# Patient Record
Sex: Female | Born: 1940 | Race: White | Hispanic: No | Smoking: Former smoker
Health system: Southern US, Community
[De-identification: ages and names within clinical notes are randomized; demographics above are authoritative.]

## PROBLEM LIST (undated history)

## (undated) DIAGNOSIS — K562 Volvulus: Secondary | ICD-10-CM

## (undated) DIAGNOSIS — M1990 Unspecified osteoarthritis, unspecified site: Secondary | ICD-10-CM

## (undated) DIAGNOSIS — E039 Hypothyroidism, unspecified: Secondary | ICD-10-CM

## (undated) DIAGNOSIS — J449 Chronic obstructive pulmonary disease, unspecified: Secondary | ICD-10-CM

## (undated) DIAGNOSIS — F32A Depression, unspecified: Secondary | ICD-10-CM

## (undated) DIAGNOSIS — K222 Esophageal obstruction: Secondary | ICD-10-CM

## (undated) DIAGNOSIS — Z860101 Personal history of adenomatous and serrated colon polyps: Secondary | ICD-10-CM

## (undated) DIAGNOSIS — R911 Solitary pulmonary nodule: Secondary | ICD-10-CM

## (undated) DIAGNOSIS — F329 Major depressive disorder, single episode, unspecified: Secondary | ICD-10-CM

## (undated) DIAGNOSIS — K5790 Diverticulosis of intestine, part unspecified, without perforation or abscess without bleeding: Secondary | ICD-10-CM

## (undated) DIAGNOSIS — R0602 Shortness of breath: Secondary | ICD-10-CM

## (undated) DIAGNOSIS — F411 Generalized anxiety disorder: Secondary | ICD-10-CM

## (undated) DIAGNOSIS — I679 Cerebrovascular disease, unspecified: Secondary | ICD-10-CM

## (undated) DIAGNOSIS — M810 Age-related osteoporosis without current pathological fracture: Secondary | ICD-10-CM

## (undated) DIAGNOSIS — Z8701 Personal history of pneumonia (recurrent): Secondary | ICD-10-CM

## (undated) DIAGNOSIS — Z8673 Personal history of transient ischemic attack (TIA), and cerebral infarction without residual deficits: Secondary | ICD-10-CM

## (undated) DIAGNOSIS — J189 Pneumonia, unspecified organism: Secondary | ICD-10-CM

## (undated) DIAGNOSIS — R768 Other specified abnormal immunological findings in serum: Secondary | ICD-10-CM

## (undated) DIAGNOSIS — E785 Hyperlipidemia, unspecified: Secondary | ICD-10-CM

## (undated) DIAGNOSIS — K648 Other hemorrhoids: Secondary | ICD-10-CM

## (undated) DIAGNOSIS — I5032 Chronic diastolic (congestive) heart failure: Secondary | ICD-10-CM

## (undated) DIAGNOSIS — K626 Ulcer of anus and rectum: Secondary | ICD-10-CM

## (undated) DIAGNOSIS — Z86010 Personal history of colonic polyps: Secondary | ICD-10-CM

## (undated) HISTORY — DX: Cerebrovascular disease, unspecified: I67.9

## (undated) HISTORY — DX: Personal history of colonic polyps: Z86.010

## (undated) HISTORY — PX: CATARACT EXTRACTION: SUR2

## (undated) HISTORY — DX: Depression, unspecified: F32.A

## (undated) HISTORY — DX: Esophageal obstruction: K22.2

## (undated) HISTORY — PX: HIATAL HERNIA REPAIR: SHX195

## (undated) HISTORY — DX: Personal history of adenomatous and serrated colon polyps: Z86.0101

## (undated) HISTORY — DX: Unspecified osteoarthritis, unspecified site: M19.90

## (undated) HISTORY — DX: Major depressive disorder, single episode, unspecified: F32.9

## (undated) HISTORY — DX: Age-related osteoporosis without current pathological fracture: M81.0

## (undated) HISTORY — DX: Volvulus: K56.2

## (undated) HISTORY — DX: Personal history of transient ischemic attack (TIA), and cerebral infarction without residual deficits: Z86.73

## (undated) HISTORY — DX: Hyperlipidemia, unspecified: E78.5

## (undated) HISTORY — PX: TONSILLECTOMY: SUR1361

## (undated) HISTORY — PX: BLADDER SURGERY: SHX569

## (undated) HISTORY — DX: Generalized anxiety disorder: F41.1

## (undated) HISTORY — DX: Solitary pulmonary nodule: R91.1

## (undated) HISTORY — DX: Other hemorrhoids: K64.8

## (undated) HISTORY — DX: Chronic obstructive pulmonary disease, unspecified: J44.9

## (undated) HISTORY — DX: Diverticulosis of intestine, part unspecified, without perforation or abscess without bleeding: K57.90

## (undated) HISTORY — DX: Other specified abnormal immunological findings in serum: R76.8

## (undated) HISTORY — DX: Chronic diastolic (congestive) heart failure: I50.32

## (undated) HISTORY — DX: Ulcer of anus and rectum: K62.6

## (undated) HISTORY — DX: Hypothyroidism, unspecified: E03.9

## (undated) HISTORY — DX: Personal history of pneumonia (recurrent): Z87.01

---

## 1996-07-17 HISTORY — PX: TOTAL ABDOMINAL HYSTERECTOMY W/ BILATERAL SALPINGOOPHORECTOMY: SHX83

## 1997-07-17 LAB — HM COLONOSCOPY

## 1997-10-15 ENCOUNTER — Encounter: Admission: RE | Admit: 1997-10-15 | Discharge: 1998-01-13 | Payer: Self-pay | Admitting: Internal Medicine

## 1999-09-25 ENCOUNTER — Encounter: Payer: Self-pay | Admitting: Emergency Medicine

## 1999-09-25 ENCOUNTER — Emergency Department (HOSPITAL_COMMUNITY): Admission: EM | Admit: 1999-09-25 | Discharge: 1999-09-25 | Payer: Self-pay | Admitting: Emergency Medicine

## 2000-11-03 ENCOUNTER — Encounter: Payer: Self-pay | Admitting: Emergency Medicine

## 2000-11-03 ENCOUNTER — Inpatient Hospital Stay (HOSPITAL_COMMUNITY): Admission: EM | Admit: 2000-11-03 | Discharge: 2000-11-08 | Payer: Self-pay | Admitting: Emergency Medicine

## 2000-11-04 ENCOUNTER — Encounter: Payer: Self-pay | Admitting: Internal Medicine

## 2000-11-07 ENCOUNTER — Encounter: Payer: Self-pay | Admitting: Internal Medicine

## 2004-08-01 ENCOUNTER — Ambulatory Visit: Payer: Self-pay | Admitting: Internal Medicine

## 2004-09-20 ENCOUNTER — Ambulatory Visit: Payer: Self-pay | Admitting: Internal Medicine

## 2004-10-03 ENCOUNTER — Ambulatory Visit: Payer: Self-pay | Admitting: Internal Medicine

## 2004-11-01 ENCOUNTER — Ambulatory Visit: Payer: Self-pay | Admitting: Internal Medicine

## 2004-11-03 ENCOUNTER — Ambulatory Visit: Payer: Self-pay | Admitting: Cardiology

## 2004-12-14 ENCOUNTER — Ambulatory Visit: Payer: Self-pay | Admitting: Internal Medicine

## 2005-02-23 ENCOUNTER — Ambulatory Visit: Payer: Self-pay | Admitting: Internal Medicine

## 2005-03-23 ENCOUNTER — Ambulatory Visit: Payer: Self-pay | Admitting: Internal Medicine

## 2006-06-01 ENCOUNTER — Ambulatory Visit: Payer: Self-pay | Admitting: Internal Medicine

## 2006-06-01 LAB — CONVERTED CEMR LAB
ALT: 10 units/L (ref 0–40)
AST: 23 units/L (ref 0–37)
Albumin: 4.2 g/dL (ref 3.5–5.2)
Alkaline Phosphatase: 47 units/L (ref 39–117)
BUN: 5 mg/dL — ABNORMAL LOW (ref 6–23)
Basophils Absolute: 0 10*3/uL (ref 0.0–0.1)
Basophils Relative: 0 % (ref 0.0–1.0)
CO2: 32 meq/L (ref 19–32)
Calcium: 9.5 mg/dL (ref 8.4–10.5)
Chloride: 101 meq/L (ref 96–112)
Chol/HDL Ratio, serum: 5.4
Cholesterol: 303 mg/dL (ref 0–200)
Creatinine, Ser: 0.8 mg/dL (ref 0.4–1.2)
Eosinophil percent: 1.2 % (ref 0.0–5.0)
GFR calc non Af Amer: 77 mL/min
Glomerular Filtration Rate, Af Am: 93 mL/min/{1.73_m2}
Glucose, Bld: 84 mg/dL (ref 70–99)
HCT: 44.9 % (ref 36.0–46.0)
HDL: 56.3 mg/dL (ref >39.0)
Hemoglobin: 15.3 g/dL — ABNORMAL HIGH (ref 12.0–15.0)
LDL DIRECT: 224.2 mg/dL
Lymphocytes Relative: 36.3 % (ref 12.0–46.0)
MCHC: 34 g/dL (ref 30.0–36.0)
MCV: 96 fL (ref 78.0–100.0)
Monocytes Absolute: 0.3 10*3/uL (ref 0.2–0.7)
Monocytes Relative: 7.7 % (ref 3.0–11.0)
Neutro Abs: 2.5 10*3/uL (ref 1.4–7.7)
Neutrophils Relative %: 54.8 % (ref 43.0–77.0)
Platelets: 278 10*3/uL (ref 150–400)
Potassium: 4.5 meq/L (ref 3.5–5.1)
RBC: 4.67 M/uL (ref 3.87–5.11)
RDW: 13.3 % (ref 11.5–14.6)
Sodium: 140 meq/L (ref 135–145)
TSH: >100 microintl units/mL — ABNORMAL HIGH (ref 0.35–5.50)
Total Bilirubin: 0.7 mg/dL (ref 0.3–1.2)
Total Protein: 7 g/dL (ref 6.0–8.3)
Triglyceride fasting, serum: 110 mg/dL (ref 0–149)
VLDL: 22 mg/dL (ref 0–40)
WBC: 4.5 10*3/uL (ref 4.5–10.5)

## 2006-06-13 ENCOUNTER — Ambulatory Visit: Payer: Self-pay | Admitting: Internal Medicine

## 2006-06-22 ENCOUNTER — Ambulatory Visit: Payer: Self-pay | Admitting: Internal Medicine

## 2006-07-03 ENCOUNTER — Ambulatory Visit: Payer: Self-pay | Admitting: Internal Medicine

## 2006-07-18 ENCOUNTER — Ambulatory Visit: Payer: Self-pay | Admitting: Internal Medicine

## 2006-07-30 ENCOUNTER — Encounter: Payer: Self-pay | Admitting: Internal Medicine

## 2006-07-30 ENCOUNTER — Ambulatory Visit: Payer: Self-pay | Admitting: Internal Medicine

## 2006-07-30 ENCOUNTER — Encounter (INDEPENDENT_AMBULATORY_CARE_PROVIDER_SITE_OTHER): Payer: Self-pay | Admitting: Specialist

## 2006-08-20 ENCOUNTER — Ambulatory Visit: Payer: Self-pay | Admitting: Internal Medicine

## 2006-08-20 LAB — CONVERTED CEMR LAB
Free T4: 0.9 ng/dL (ref 0.6–1.6)
TSH: 11.58 microintl units/mL — ABNORMAL HIGH (ref 0.35–5.50)

## 2006-08-24 ENCOUNTER — Ambulatory Visit: Payer: Self-pay | Admitting: Internal Medicine

## 2006-10-15 ENCOUNTER — Ambulatory Visit: Payer: Self-pay | Admitting: Internal Medicine

## 2006-11-27 ENCOUNTER — Ambulatory Visit: Payer: Self-pay | Admitting: Internal Medicine

## 2006-11-27 LAB — CONVERTED CEMR LAB
Free T4: 0.9 ng/dL (ref 0.6–1.6)
TSH: 13.07 microintl units/mL — ABNORMAL HIGH (ref 0.35–5.50)

## 2007-02-04 DIAGNOSIS — E039 Hypothyroidism, unspecified: Secondary | ICD-10-CM

## 2007-02-15 ENCOUNTER — Ambulatory Visit (HOSPITAL_COMMUNITY): Admission: RE | Admit: 2007-02-15 | Discharge: 2007-02-15 | Payer: Self-pay | Admitting: Internal Medicine

## 2007-02-15 ENCOUNTER — Encounter: Payer: Self-pay | Admitting: Internal Medicine

## 2007-03-12 ENCOUNTER — Ambulatory Visit: Payer: Self-pay | Admitting: Internal Medicine

## 2007-06-10 ENCOUNTER — Ambulatory Visit: Payer: Self-pay | Admitting: Internal Medicine

## 2007-06-10 DIAGNOSIS — Z860100 Personal history of colon polyps, unspecified: Secondary | ICD-10-CM | POA: Insufficient documentation

## 2007-06-11 LAB — CONVERTED CEMR LAB: TSH: 4.26 microintl units/mL (ref 0.35–5.50)

## 2007-06-17 ENCOUNTER — Encounter: Payer: Self-pay | Admitting: Internal Medicine

## 2007-06-17 ENCOUNTER — Ambulatory Visit: Payer: Self-pay | Admitting: Internal Medicine

## 2007-09-09 ENCOUNTER — Ambulatory Visit: Payer: Self-pay | Admitting: Internal Medicine

## 2007-10-31 LAB — CONVERTED CEMR LAB: TSH: 0.25 microintl units/mL — ABNORMAL LOW (ref 0.35–5.50)

## 2007-11-04 ENCOUNTER — Telehealth: Payer: Self-pay | Admitting: Internal Medicine

## 2008-01-08 ENCOUNTER — Ambulatory Visit: Payer: Self-pay | Admitting: Internal Medicine

## 2008-01-08 DIAGNOSIS — G4700 Insomnia, unspecified: Secondary | ICD-10-CM

## 2008-01-09 ENCOUNTER — Telehealth: Payer: Self-pay | Admitting: Internal Medicine

## 2008-01-10 LAB — CONVERTED CEMR LAB
ALT: 16 units/L (ref 0–35)
AST: 35 units/L (ref 0–37)
Albumin: 4 g/dL (ref 3.5–5.2)
Basophils Absolute: 0 10*3/uL (ref 0.0–0.1)
Basophils Relative: 0.2 % (ref 0.0–1.0)
Bilirubin, Direct: 0.5 mg/dL — ABNORMAL HIGH (ref 0.0–0.3)
Eosinophils Relative: 0.7 % (ref 0.0–5.0)
Hemoglobin: 15.6 g/dL — ABNORMAL HIGH (ref 12.0–15.0)
MCHC: 33.9 g/dL (ref 30.0–36.0)
Monocytes Absolute: 0.7 10*3/uL (ref 0.1–1.0)
Neutro Abs: 7.5 10*3/uL (ref 1.4–7.7)
Neutrophils Relative %: 78.1 % — ABNORMAL HIGH (ref 43.0–77.0)
RBC: 4.91 M/uL (ref 3.87–5.11)
Total Bilirubin: 1.3 mg/dL — ABNORMAL HIGH (ref 0.3–1.2)

## 2008-06-11 ENCOUNTER — Inpatient Hospital Stay (HOSPITAL_COMMUNITY): Admission: EM | Admit: 2008-06-11 | Discharge: 2008-06-16 | Payer: Self-pay | Admitting: Emergency Medicine

## 2008-06-11 ENCOUNTER — Ambulatory Visit: Payer: Self-pay | Admitting: Internal Medicine

## 2008-06-11 ENCOUNTER — Encounter: Payer: Self-pay | Admitting: Internal Medicine

## 2008-06-11 DIAGNOSIS — R0989 Other specified symptoms and signs involving the circulatory and respiratory systems: Secondary | ICD-10-CM

## 2008-06-11 DIAGNOSIS — R930 Abnormal findings on diagnostic imaging of skull and head, not elsewhere classified: Secondary | ICD-10-CM | POA: Insufficient documentation

## 2008-06-12 ENCOUNTER — Telehealth: Payer: Self-pay | Admitting: Internal Medicine

## 2008-06-26 ENCOUNTER — Encounter: Payer: Self-pay | Admitting: Internal Medicine

## 2008-06-30 ENCOUNTER — Ambulatory Visit: Payer: Self-pay | Admitting: Internal Medicine

## 2008-07-01 ENCOUNTER — Ambulatory Visit: Payer: Self-pay | Admitting: Internal Medicine

## 2008-07-08 ENCOUNTER — Encounter: Payer: Self-pay | Admitting: Internal Medicine

## 2008-07-28 ENCOUNTER — Telehealth: Payer: Self-pay | Admitting: Internal Medicine

## 2008-07-30 ENCOUNTER — Encounter: Payer: Self-pay | Admitting: Internal Medicine

## 2008-08-04 ENCOUNTER — Ambulatory Visit: Payer: Self-pay | Admitting: Internal Medicine

## 2008-08-04 DIAGNOSIS — E785 Hyperlipidemia, unspecified: Secondary | ICD-10-CM

## 2008-08-06 LAB — CONVERTED CEMR LAB
HDL: 58.1 mg/dL (ref >39.0)
Total CHOL/HDL Ratio: 4.7
VLDL: 41 mg/dL — ABNORMAL HIGH (ref 0–40)

## 2008-08-26 ENCOUNTER — Encounter: Payer: Self-pay | Admitting: Internal Medicine

## 2008-12-03 ENCOUNTER — Ambulatory Visit: Payer: Self-pay | Admitting: Internal Medicine

## 2008-12-03 LAB — CONVERTED CEMR LAB
Albumin: 3.9 g/dL (ref 3.5–5.2)
Bilirubin, Direct: 0.1 mg/dL (ref 0.0–0.3)
Cholesterol: 206 mg/dL — ABNORMAL HIGH (ref 0–200)
HDL: 45 mg/dL (ref >39.00)
Total CHOL/HDL Ratio: 5
VLDL: 25.2 mg/dL (ref 0.0–40.0)

## 2008-12-15 ENCOUNTER — Ambulatory Visit: Payer: Self-pay | Admitting: Internal Medicine

## 2009-07-02 ENCOUNTER — Ambulatory Visit: Payer: Self-pay | Admitting: Internal Medicine

## 2009-07-02 LAB — CONVERTED CEMR LAB
HDL: 48.9 mg/dL (ref >39.00)
TSH: 0.3 microintl units/mL — ABNORMAL LOW (ref 0.35–5.50)
Total Bilirubin: 0.5 mg/dL (ref 0.3–1.2)
Total CHOL/HDL Ratio: 4
Triglycerides: 126 mg/dL (ref 0.0–149.0)

## 2009-08-20 ENCOUNTER — Ambulatory Visit: Payer: Self-pay | Admitting: Internal Medicine

## 2009-08-20 DIAGNOSIS — F329 Major depressive disorder, single episode, unspecified: Secondary | ICD-10-CM | POA: Insufficient documentation

## 2009-08-24 LAB — CONVERTED CEMR LAB
ALT: 14 units/L (ref 0–35)
AST: 23 units/L (ref 0–37)
Albumin: 4.3 g/dL (ref 3.5–5.2)
Bilirubin, Direct: 0.2 mg/dL (ref 0.0–0.3)
Cholesterol: 202 mg/dL — ABNORMAL HIGH (ref 0–200)
HDL: 64.3 mg/dL (ref >39.00)
Total Bilirubin: 0.5 mg/dL (ref 0.3–1.2)
Triglycerides: 72 mg/dL (ref 0.0–149.0)

## 2009-10-31 ENCOUNTER — Inpatient Hospital Stay (HOSPITAL_COMMUNITY): Admission: EM | Admit: 2009-10-31 | Discharge: 2009-11-05 | Payer: Self-pay | Admitting: Emergency Medicine

## 2009-11-01 ENCOUNTER — Ambulatory Visit: Payer: Self-pay | Admitting: Internal Medicine

## 2009-11-10 ENCOUNTER — Telehealth: Payer: Self-pay | Admitting: Internal Medicine

## 2009-11-16 ENCOUNTER — Ambulatory Visit: Payer: Self-pay | Admitting: Internal Medicine

## 2009-11-16 ENCOUNTER — Encounter: Payer: Self-pay | Admitting: Internal Medicine

## 2009-11-17 ENCOUNTER — Ambulatory Visit: Payer: Self-pay | Admitting: Internal Medicine

## 2009-11-24 ENCOUNTER — Telehealth: Payer: Self-pay | Admitting: Internal Medicine

## 2009-11-24 ENCOUNTER — Ambulatory Visit: Payer: Self-pay | Admitting: Internal Medicine

## 2009-11-25 ENCOUNTER — Ambulatory Visit: Payer: Self-pay | Admitting: Internal Medicine

## 2009-11-29 ENCOUNTER — Ambulatory Visit: Payer: Self-pay | Admitting: Internal Medicine

## 2009-12-22 ENCOUNTER — Ambulatory Visit: Payer: Self-pay | Admitting: Internal Medicine

## 2009-12-28 ENCOUNTER — Ambulatory Visit: Payer: Self-pay | Admitting: Internal Medicine

## 2009-12-28 DIAGNOSIS — L408 Other psoriasis: Secondary | ICD-10-CM | POA: Insufficient documentation

## 2009-12-29 ENCOUNTER — Telehealth: Payer: Self-pay | Admitting: Internal Medicine

## 2009-12-29 LAB — CONVERTED CEMR LAB: TSH: 1.88 microintl units/mL (ref 0.35–5.50)

## 2010-06-17 ENCOUNTER — Ambulatory Visit: Payer: Self-pay | Admitting: Internal Medicine

## 2010-06-17 LAB — CONVERTED CEMR LAB
AST: 17 units/L (ref 0–37)
Alkaline Phosphatase: 57 units/L (ref 39–117)
Cholesterol: 270 mg/dL — ABNORMAL HIGH (ref 0–200)
Direct LDL: 171.7 mg/dL
TSH: 22.3 microintl units/mL — ABNORMAL HIGH (ref 0.35–5.50)
Triglycerides: 83 mg/dL (ref 0.0–149.0)

## 2010-07-17 HISTORY — PX: HEMICOLECTOMY: SHX854

## 2010-08-16 NOTE — Assessment & Plan Note (Signed)
Summary: 4 MTH ROV // RS   Vital Signs:  Patient profile:   70 year old female Weight:      84 pounds Temp:     98.8 degrees F oral Pulse rate:   80 / minute Pulse rhythm:   regular BP sitting:   148 / 90  (left arm) Cuff size:   regular  Vitals Entered By: Alfred Levins, CMA (June 17, 2010 11:02 AM) CC: f/u   Primary Care Provider:  Birdie Sons MD  CC:  f/u.  History of Present Illness:  Follow-Up Visit      This is a 69 year old woman who presents for Follow-up visit.  The patient denies chest pain and palpitations.  Since the last visit the patient notes no new problems or concerns.  The patient reports taking meds as prescribed.  When questioned about possible medication side effects, the patient notes none.    All other systems reviewed and were negative   Current Problems (verified): 1)  Psoriasis  (ICD-696.1) 2)  Depression  (ICD-311) 3)  Hyperlipidemia  (ICD-272.4) 4)  Carotid Bruit, Left  (ICD-785.9) 5)  Abnormal Chest Xray  (ICD-793.1) 6)  Insomnia-sleep Disorder-unspec  (ICD-780.52) 7)  Colonic Polyps, Hx of  (ICD-V12.72) 8)  Hypothyroidism  (ICD-244.9) 9)  COPD  (ICD-496)  Allergies (verified): No Known Drug Allergies  Past History:  Past Medical History: Last updated: 12/22/2009 COPD     - 12/22/09 PFT's FEV1  1.32 (77%) ratio 50 and DLC0 70 Hypothyroidism smoker Colonic polyps, hx of-adenomatous with high grade glandular dysplasia  ? psoriasis / rash on her upper back Hyperlipidemia Multiple Pulmonary Nodules ? MAI...........................................Marland KitchenWert     - CT 10/31/09 MPN with ? 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 She looks older than her stated age. HEENT exam atraumatic, normocephalic, neck supple, no thyroid masses. Chest clear to auscultation cardiac exam S1-S2 are regular. Abdominal exam active bowel sounds, soft, nontender, thin and extremities no clubbing cyanosis or edema.   Impression & Recommendations:  Problem # 1:  COPD (ICD-496) she has quit smoking. Continue current medications. The following medications were removed from the medication list:--noncompliant    Spiriva Handihaler 18 Mcg Caps (Tiotropium bromide monohydrate) ..... Inhale once daily as directed  Pulmonary Functions Reviewed: O2 sat: 95 (12/22/2009)     Vaccines Reviewed: Pneumovax: Pneumovax (05/17/2006)   Flu Vax: Historical (04/14/2010)  Problem # 2:  HYPERLIPIDEMIA (ICD-272.4) controlled continue current medications  check labs  Her updated medication list for this problem includes:    Simvastatin 20 Mg Tabs (Simvastatin) .Marland Kitchen... Take 1 tablet by mouth at bedtime  Orders: Venipuncture (06269) Specimen Handling (48546) TLB-Lipid Panel (80061-LIPID) TLB-Hepatic/Liver Function Pnl (80076-HEPATIC)  Labs Reviewed: SGOT: 23 (08/20/2009)   SGPT: 14 (08/20/2009)   HDL:64.30 (08/20/2009), 48.90 (07/02/2009)  LDL:DEL (08/04/2008), DEL (06/01/2006)  Chol:202 (08/20/2009), 207 (07/02/2009)  Trig:72.0 (08/20/2009), 126.0 (07/02/2009)  Problem # 3:  HYPOTHYROIDISM (ICD-244.9) check labs today. Her updated medication list for this problem includes:    Levothyroxine Sodium 75 Mcg Tabs (Levothyroxine sodium) .Marland Kitchen... Take 1 tablet by mouth once a day  Orders: Specimen  Handling (16109) TLB-TSH (Thyroid Stimulating Hormone) (84443-TSH)  Labs Reviewed: TSH: 1.88 (12/28/2009)    Chol: 202 (08/20/2009)   HDL: 64.30 (08/20/2009)   LDL: DEL (08/04/2008)   TG: 72.0 (08/20/2009)  Complete Medication List: 1)  Levothyroxine Sodium 75 Mcg Tabs (Levothyroxine sodium) .... Take 1 tablet by mouth once a day 2)  Simvastatin 20 Mg Tabs (Simvastatin) .... Take 1 tablet by mouth at bedtime 3)  Womens Multivitamin Plus Tabs (Multiple vitamins-minerals) .... 1/2 two times a day 4)  Temazepam 15 Mg Caps (Temazepam) .... Take one at bedtime as needed no more than 4 times weekly 5)  Ultravate 0.05 % Oint (Halobetasol propionate) .... Apply 1 a small amount to affected area  twice a day Prescriptions: LEVOTHYROXINE SODIUM 75 MCG TABS (LEVOTHYROXINE SODIUM) Take 1 tablet by mouth once a day  #90 x 3   Entered and Authorized by:   Birdie Sons MD   Signed by:   Birdie Sons MD on 06/17/2010   Method used:   Electronically to        [facility] [address] (retail)       [address]       [place], [place]  [address]       Ph: [phone] or [phone]       Fax: [phone]   RxID:   [id number] SIMVASTATIN 20 MG TABS (SIMVASTATIN) Take 1 tablet by mouth at bedtime  #90 x 3   Entered and Authorized by:   Birdie Sons MD   Signed by:   Birdie Sons MD on 06/17/2010   Method used:   Electronically to        [facility] [address] (retail)       [address]       [place], [place]  [address]       Ph: [phone] or [phone]       Fax: [phone]   RxID:   [id number] ULTRAVATE 0.05 % OINT (HALOBETASOL PROPIONATE) Apply 1 a small amount to affected area  twice a day  #50grams x 2   Entered and Authorized by:   Birdie Sons MD   Signed by:   Birdie Sons MD on 06/17/2010   Method used:   Electronically to        [facility] [address] (retail)       [address]       [place], [place]  [address]       Ph: [phone] or [phone]       Fax: [phone]   RxID:   [id number]    Orders Added: 1)  Venipuncture [93716] 2)  Specimen Handling [99000] 3)  TLB-Lipid Panel [80061-LIPID] 4)  TLB-Hepatic/Liver Function Pnl [80076-HEPATIC] 5)  TLB-TSH (Thyroid Stimulating Hormone) [84443-TSH] 6)  Est. Patient Level IV [96789]   Immunization History:  Influenza Immunization History:    Influenza:  historical (04/14/2010)   Immunization History:  Influenza Immunization History:    Influenza:  Historical (04/14/2010)

## 2010-08-16 NOTE — Procedures (Signed)
Summary: Colonoscopy/[facility]  Colonoscopy/[facility]   Imported By: Lanelle Bal 06/17/2010 11:37:00  _____________________________________________________________________  External Attachment:    Type:   Image     Comment:   External Document

## 2010-08-16 NOTE — Assessment & Plan Note (Signed)
Summary: Pulmonary/ ext post hosp f/u with dpi 90%    Visit Type:  Hospital Follow-up Primary Provider/Referring Provider:  Birdie Sons MD  CC:  Post hospital followup.  Pt states that her breathing has improved.  She c/o dry cough and nasal congestion x 4 days.  Denies any fever.  Marland Kitchen  History of Present Illness: 89 yowf quit smoking October 31 2009 with admit [facility]ecopd to [facility]  Nov 17, 2009 Post hospital followup.  Pt states that her breathing has improved.  She c/o dry cough and nasal congestion x 4 days.  Denies any fever.  best she's been in 14 years in terms of activty tolerance. Pt denies any significant sore throat, dysphagia, itching, sneezing, purulent sputum or  fever, chills, sweats, unintended wt loss, pleuritic or exertional cp, hempoptysis, change in activity tolerance  orthopnea pnd or leg swelling. Pt also denies any obvious fluctuation in symptoms with weather or environmental change or other alleviating or aggravating factors.        Current Medications (verified): 1)  Levothyroxine Sodium 75 Mcg Tabs (Levothyroxine Sodium) .... Take 1 Tablet By Mouth Once A Day 2)  Ultravate 0.05 % Oint (Halobetasol Propionate) .... Apply 1 A Small Amount To Affected Area  Twice A Day 3)  Temazepam 15 Mg  Caps (Temazepam) .... Take One At Bedtime As Needed No More Than 4 Times Weekly 4)  Simvastatin 20 Mg Tabs (Simvastatin) .... Take 1 Tablet By Mouth At Bedtime 5)  Womens Multivitamin Plus  Tabs (Multiple Vitamins-Minerals) .... 1/2 Two Times A Day 6)  Spiriva Handihaler 18 Mcg Caps (Tiotropium Bromide Monohydrate) .... Inhale Once Daily As Directed 7)  Mucinex 600 Mg Xr12h-Tab (Guaifenesin) .... Two Times A Day As Needed 8)  Advair Diskus 250-50 Mcg/dose Aepb (Fluticasone-Salmeterol) .... Inhale One Puff Two Times A Day  Allergies (verified): No Known Drug Allergies  Past History:  Past Medical History: COPD Hypothyroidism smoker Colonic polyps, hx of-adenomatous with high grade  glandular dysplasia  ? psoriasis / rash on her upper back Hyperlipidemia Multiple Pulmonary Nodules ? MAI...........................................Marland KitchenWert     - CT 10/31/09 MPN with ? 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 Oropharynx with dentures and mild thrush or excess pnd or cobblestoning.  No JVD or cervical adenopathy. Mild accessory muscle hypertrophy. Trachea midline, nl thryroid. Chest was hyperinflated by percussion with diminished breath sounds and moderate increased exp time without wheeze. Hoover sign positive at mid inspiration. Regular rate and rhythm without murmur gallop or rub or increase P2 or edema.  Abd: no hsm, nl excursion. Ext warm without cyanosis or clubbing.     Impression & Recommendations:  Problem # 1:  COPD (ICD-496)  DDX of  difficult airways managment all start with A and  include Adherence, Ace Inhibitors, Acid Reflux, Active Sinus Disease, Alpha  1 Antitripsin deficiency, Anxiety masquerading as Airways dz,  ABPA,  allergy(esp in young), Aspiration (esp in elderly), Adverse effects of DPI,  Active smokers, plus one B  = Beta blocker use.Marland Kitchen    Active smoking: resolved for now  Adherence:  DPI reviewed, 90% effective with coaching  Anxiety:  may become more of an issue off cigarettes  Needs f/u cxr and pft's 6 weeks  Each maintenance medication was reviewed in detail including most importantly the difference between maintenance prns and  under what circumstances the prns are to be used.  In addition, these two groups (for which the patient should keep up with refills) were distinguished from a third group :  meds that are used only short term with the intent to complete a course of therapy and then not refill them.  The med list was then fully reconciled and reorganized to reflect this important distinction.   Problem # 2:  ABNORMAL CHEST XRAY (ICD-793.1) ESR 60, Sputum grew nl flora - clearly this is not M TB but may be   MAI > See instructions for specific recommendations, no need for tissue dx or rx at this point  - need to see where her symptoms and cxr settle out now that she's off cigs  Other Orders: Est. Patient Level IV (16109) HFA Instruction [phone])  Patient Instructions: 1)  Please schedule a follow-up appointment in 6 weeks, sooner if needed with cxr and pft's 2)  congratulations on the quit smoking, it's the most important thing you can do to maintain your lung health 3)  ESR 60 NEEDS REPEAT NEXT OV

## 2010-08-16 NOTE — Assessment & Plan Note (Signed)
Summary: POST HOSPITAL FOLLOW UP//[facility]   Vital Signs:  Patient profile:   70 year old female Weight:      81 pounds BMI:     15.36 O2 Sat:      91 % on Room air Temp:     99.5 degrees F oral Pulse rate:   96 / minute Pulse rhythm:   regular Resp:     12 per minute BP sitting:   90 / 68  (left arm) Cuff size:   regular  Vitals Entered By: Gladis Riffle, RN (Nov 16, 2009 9:59 AM)  O2 Flow:  Room air CC: hospital FU for pneumonia--developed cough yesterday Is Patient Diabetic? No   Primary Care Provider:  Birdie Sons MD  CC:  hospital FU for pneumonia--developed cough yesterday.  History of Present Illness:  Follow-Up Visit      This is a 70 year old woman who presents for Follow-up visit.  The patient complains of SOB, but denies chest pain and palpitations.  Since the last visit the patient notes a recent hospitilization and being seen by a specialist.  The patient reports taking meds as prescribed.  When questioned about possible medication side effects, the patient notes none.  Reviewed hospital records and communicated with Dr. Darnelle Catalan at the time of Dc. She has quit smoking again. She is feeling well ("best I've felt in 13 years). Last night developed some sinus congestion, today she developed a cough and mucopurulent  sputum---different then when whe went to the hospital (she still feels well).  All other systems reviewed and were negative   All other systems reviewed and were negative   Preventive Screening-Counseling & Management  Alcohol-Tobacco     Smoking Status: quit     Year Quit: 2011  Comments: quit 10/29/09  Current Problems (verified): 1)  Depression  (ICD-311) 2)  Hyperlipidemia  (ICD-272.4) 3)  Carotid Bruit, Left  (ICD-785.9) 4)  Abnormal Chest Xray  (ICD-793.1) 5)  Chronic Obstructive Pulmonary Disease, Acute Exacerbation  (ICD-491.21) 6)  Insomnia-sleep Disorder-unspec  (ICD-780.52) 7)  Colonic Polyps, Hx of  (ICD-V12.72) 8)  Tobacco Use   (ICD-305.1) 9)  Hypothyroidism  (ICD-244.9) 10)  COPD  (ICD-496)  Current Medications (verified): 1)  Levothyroxine Sodium 75 Mcg Tabs (Levothyroxine Sodium) .... Take 1 Tablet By Mouth Once A Day 2)  Ultravate 0.05 % Oint (Halobetasol Propionate) .... Apply 1 A Small Amount To Affected Area  Twice A Day 3)  Temazepam 15 Mg  Caps (Temazepam) .... Take One At Bedtime As Needed No More Than 4 Times Weekly 4)  Simvastatin 20 Mg Tabs (Simvastatin) .... Take 1 Tablet By Mouth At Bedtime 5)  Womens Multivitamin Plus  Tabs (Multiple Vitamins-Minerals) .... 1/2 Two Times A Day 6)  Spiriva Handihaler 18 Mcg Caps (Tiotropium Bromide Monohydrate) .... Inhale Once Daily As Directed 7)  Mucinex 600 Mg Xr12h-Tab (Guaifenesin) .... Two Times A Day As Needed 8)  Advair Diskus 250-50 Mcg/dose Aepb (Fluticasone-Salmeterol) .... Inhale One Puff Two Times A Day  Allergies (verified): No Known Drug Allergies  Past History:  Past Medical History: Last updated: 08/04/2008 COPD Hypothyroidism smoker Colonic polyps, hx of-adenomatous with high grade glandular dysplasia  ? 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   Eyes:  pupils equal and pupils round.   Ears:  R ear normal and L ear normal.   Nose:  no external deformity and no external erythema.    Neck:  No deformities, masses, or tenderness noted. Chest Wall:  No deformities, masses, or tenderness noted. Heart:  normal rate and regular rhythm.   Abdomen:  soft and non-tender.   Msk:  No deformity or scoliosis noted of thoracic or lumbar spine.   Pulses:  R radial normal and L radial normal.   Neurologic:  cranial nerves II-XII intact and gait normal.   Skin:  turgor normal and color normal.     Impression & Recommendations:  Problem # 1:  CHRONIC OBSTRUCTIVE PULMONARY DISEASE, ACUTE EXACERBATION (ICD-491.21) reviewed hospital records note abnormal CT---has f/u with Dr. Sherene Sires tomorrow She has QUIT smoking---congratulated  Problem # 2:  HYPERLIPIDEMIA (ICD-272.4) labs ok previously Her updated medication list for this problem includes:    Simvastatin 20 Mg Tabs (Simvastatin) .Marland Kitchen... Take 1 tablet by mouth at bedtime  Labs Reviewed: SGOT: 23 (08/20/2009)   SGPT: 14 (08/20/2009)   HDL:64.30 (08/20/2009), 48.90 (07/02/2009)  LDL:DEL (08/04/2008), DEL (06/01/2006)  Chol:202 (08/20/2009), 207 (07/02/2009)  Trig:72.0 (08/20/2009), 126.0 (07/02/2009)  Problem # 3:  URI (ICD-465.9) recent URI sxs mucinex for now she will have eval by dr wert tomorrow no need for abx at this time Her updated medication list for this problem includes:    Mucinex 600 Mg Xr12h-tab (Guaifenesin) .Marland Kitchen..Marland Kitchen Two times a day as needed  Problem # 4:  ABNORMAL CHEST XRAY (ICD-793.1) has eval with dr wert tomorrow  Problem # 5:  HYPOTHYROIDISM (ICD-244.9) tsh normal in hospital Her updated medication list for this problem includes:    Levothyroxine Sodium 75 Mcg Tabs (Levothyroxine sodium) .Marland Kitchen... Take 1 tablet by mouth once a day  Labs Reviewed: TSH: 0.06 (08/20/2009)    Chol: 202 (08/20/2009)   HDL: 64.30 (08/20/2009)   LDL: DEL (08/04/2008)   TG: 72.0 (08/20/2009)  Complete Medication List: 1)  Levothyroxine Sodium 75 Mcg Tabs (Levothyroxine sodium) .... Take 1 tablet by mouth once a day 2)  Ultravate  0.05 % Oint (Halobetasol propionate) .... Apply 1 a small amount to affected area  twice a day 3)  Temazepam 15 Mg Caps (Temazepam) .... Take one at bedtime as needed no more than 4 times weekly 4)  Simvastatin 20 Mg Tabs (Simvastatin) .... Take 1 tablet by mouth at bedtime 5)  Womens Multivitamin Plus Tabs (Multiple vitamins-minerals) .... 1/2 two times a day 6)  Spiriva Handihaler 18 Mcg Caps (Tiotropium bromide monohydrate) .... Inhale once daily as directed 7)  Mucinex 600 Mg Xr12h-tab (Guaifenesin) .... Two times a day as needed 8)  Advair Diskus 250-50 Mcg/dose Aepb (Fluticasone-salmeterol) .... Inhale one puff two times a day  Patient Instructions: 1)  see me 6 weeks   Appended Document: POST HOSPITAL FOLLOW UP//[facility] face to face eval form completed for gentiva

## 2010-08-16 NOTE — Progress Notes (Signed)
Summary: REQ FOR RX (ABX?)  Phone Note Call from Patient   Caller: Patient    [phone]  or  [phone] Reason for Call: Talk to Nurse, Talk to Doctor Summary of Call: Pt called in to speak with Alvino Chapel, RN or Dr Cato Mulligan...Marland KitchenMarland Kitchen Pt adv that she is still exp productive cough (yellow sputum), congestion, general malaise, runny nose.... "feels like a bad cold from the neck up" per pt.... pt adv that she was recently in the hospital for pneumonia and upon d/c she felt the best she has felt in years but starting this past Sunday, 11/14/2009, the pt states she began feeling bad.... Pt wanted to come in to see Dr Cato Mulligan today (n/a).Marland Kitchen offered appt with another physician but pt declined.... pt wants to know if a Rx for abx could be sent into [facility] on [address] - [place]. Initial call taken by: Debbra Riding,  Nov 24, 2009 9:00 AM  Follow-up for Phone Call        get CXR see me tomorrow at 10 Follow-up by: Birdie Sons MD,  Nov 24, 2009 11:52 AM  Additional Follow-up for Phone Call Additional follow up Details #1::        Called both #'s listed for pt ( [phone]  or  [phone])  Left msgs at both #'s adv pt to call [facility] so that she can be informed of Dr Cato Mulligan instructions and appt can be scheduled.  Additional Follow-up by: Debbra Riding,  Nov 24, 2009 12:09 PM  New Problems: COUGH (ICD-786.2)   Additional Follow-up for Phone Call Additional follow up Details #2::    Pt's daughter in law returning call, advised of physician instructions and appt tommorrow. Follow-up by: Trixie Dredge,  Nov 24, 2009 12:50 PM  New Problems: COUGH [phone])

## 2010-08-16 NOTE — Progress Notes (Signed)
Summary: home health plan  Phone Note From Other Clinic   Caller: [phone] Summary of Call: Saw her COPD exacerbation post hospital discharge Sat.  Is driving, but worried that she will kick back up with COPD again.  Patient thinks it was only pneumonia & doesn't affirm understanding that COPD primary problem.   Need your OK to continue 2x week 1 then 3x next mo 1x June is my plan.  Nurse will pursue as outlined & paperwork will come.   Initial call taken by: Rudy Jew, RN,  November 10, 2009 9:34 AM  Follow-up for Phone Call        ok Follow-up by: Birdie Sons MD,  November 10, 2009 12:15 PM

## 2010-08-16 NOTE — Assessment & Plan Note (Signed)
Summary: COUGH, CONGESTION, URI? // RS   Vital Signs:  Patient profile:   70 year old female Weight:      80 pounds Temp:     98.4 degrees F oral Pulse rate:   72 / minute Pulse rhythm:   regular Resp:     12 per minute BP sitting:   114 / 80  (left arm) Cuff size:   regular  Vitals Entered By: Gladis Riffle, RN (Nov 25, 2009 10:23 AM) CC: c/o cough, nasal drainage and head congestion x 4 weeks--denies fever, COPD follow-up Is Patient Diabetic? No   Primary Care Provider:  Birdie Sons MD  CC:  c/o cough, nasal drainage and head congestion x 4 weeks--denies fever, and COPD follow-up.  History of Present Illness:  COPD Follow-Up      This is a 70 year old woman who presents for COPD follow-up.  The patient reports shortness of breath and cough, but denies chest tightness, wheezing, increased sputum, and heat intolerance.  Symptoms occur daily.  The patient reports limitation of moderate activities.  Symptoms are worse with exercise.  Severity risk factors include hospitalization in last year and oral steroid courses in last year.  She complains of nasal congestion and some cough---occasionally productive. Reviewed recent note and hospital records (echart)  All other systems reviewed and were negative   Preventive Screening-Counseling & Management  Alcohol-Tobacco     Smoking Status: quit     Smoking Cessation Counseling: yes     Packs/Day: <0.25     Year Quit: 2011  Current Medications (verified): 1)  Levothyroxine Sodium 75 Mcg Tabs (Levothyroxine Sodium) .... Take 1 Tablet By Mouth Once A Day 2)  Simvastatin 20 Mg Tabs (Simvastatin) .... Take 1 Tablet By Mouth At Bedtime 3)  Womens Multivitamin Plus  Tabs (Multiple Vitamins-Minerals) .... 1/2 Two Times A Day 4)  Spiriva Handihaler 18 Mcg Caps (Tiotropium Bromide Monohydrate) .... Inhale Once Daily As Directed 5)  Advair Diskus 250-50 Mcg/dose Aepb (Fluticasone-Salmeterol) .... Inhale One Puff Two Times A Day 6)  Temazepam 15  Mg  Caps (Temazepam) .... Take One At Bedtime As Needed No More Than 4 Times Weekly 7)  Mucinex 600 Mg Xr12h-Tab (Guaifenesin) .... Two Times A Day As Needed 8)  Ultravate 0.05 % Oint (Halobetasol Propionate) .... Apply 1 A Small Amount To Affected Area  Twice A Day  Allergies (verified): No Known Drug Allergies  Past History:  Past Medical History: Last updated: 11/17/2009 COPD Hypothyroidism smoker Colonic polyps, hx of-adenomatous with high grade glandular dysplasia  ? psoriasis / rash on her upper back Hyperlipidemia Multiple Pulmonary Nodules ? MAI...........................................Marland KitchenWert     - CT 10/31/09 MPN with ? 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  thin chronically ill appearing Head:  normocephalic and atraumatic.   Eyes:  pupils equal and pupils round.   Ears:  R ear normal and L ear normal.  Neck:  No deformities, masses, or tenderness noted. Chest Wall:  No deformities, masses, or tenderness noted. Lungs:  normal respiratory effort and no intercostal retractions.  prolonged expiratory time no wheeze Heart:  normal rate and regular rhythm.   Abdomen:  soft and non-tender.   Msk:  No deformity or scoliosis noted of thoracic or lumbar spine.   Neurologic:  cranial nerves II-XII intact and gait  normal.   Skin:  turgor normal and color normal.     Impression & Recommendations:  Problem # 1:  COUGH (ICD-786.2) COPD exacerbation possibly no longer smoking sounds like she has some component of rhinitis---complains of PNDr Steroid taper side effects discussed  Complete Medication List: 1)  Levothyroxine Sodium 75 Mcg Tabs (Levothyroxine sodium) .... Take 1 tablet by mouth once a day 2)  Simvastatin 20 Mg Tabs (Simvastatin) .... Take 1 tablet by mouth at bedtime 3)  Womens Multivitamin Plus Tabs (Multiple vitamins-minerals) .... 1/2 two times a day 4)  Spiriva Handihaler 18 Mcg Caps (Tiotropium bromide monohydrate) .... Inhale once daily as directed 5)  Advair Diskus 250-50 Mcg/dose Aepb (Fluticasone-salmeterol) .... Inhale one puff two times a day 6)  Temazepam 15 Mg Caps (Temazepam) .... Take one at bedtime as needed no more than 4 times weekly 7)  Mucinex 600 Mg Xr12h-tab (Guaifenesin) .... Two times a day as needed 8)  Ultravate 0.05 % Oint (Halobetasol propionate) .... Apply 1 a small amount to affected area  twice a day 9)  Prednisone 10 Mg Tabs (Prednisone) .... 3 po qd for 3 days, then 2 po qd for 3 days, then 1 po qd for 3 days, then 1/2 po qd for 3 days Prescriptions: PREDNISONE 10 MG  TABS (PREDNISONE) 3 po qd for 3 days, then 2 po qd for 3 days, then 1 po qd for 3 days, then 1/2 po qd for 3 days  #25 x 0   Entered and Authorized by:   Birdie Sons MD   Signed by:   Birdie Sons MD on 11/25/2009   Method used:   Electronically to        [facility] [address] (retail)       [address]       [place], [place]  [address]       Ph: [phone] or [phone]       Fax: [phone]   RxID:   [id number]

## 2010-08-16 NOTE — Letter (Signed)
Summary: Medical Necessity for Oxygen  Medical Necessity for Oxygen   Imported By: Maryln Gottron 12/02/2009 13:53:43  _____________________________________________________________________  External Attachment:    Type:   Image     Comment:   External Document

## 2010-08-16 NOTE — Assessment & Plan Note (Signed)
Summary: 6 MONTH ROV/NJR/PT RESCD//CCM/pt rsc/cjr/pt rsc/cjr pt rsc/nj...   Vital Signs:  Patient profile:   70 year old female Height:      61 inches Weight:      77 pounds BMI:     14.60 Temp:     97.2 degrees F Pulse rate:   96 / minute Resp:     12 per minute BP sitting:   126 / 92  (left arm)  Vitals Entered By: Gladis Riffle, RN (August 20, 2009 11:41 AM) CC: 6 month rov, labs done Is Patient Diabetic? No Comments has stopped simvastatin because thought was sleeping pill and did not need two; will go back on--fiance died 16-Jun-2023   Primary Care Provider:  Birdie Sons MD  CC:  6 month rov and labs done.  History of Present Illness:  Follow-Up Visit      This is a 70 year old woman who presents for Follow-up visit.  The patient denies chest pain and palpitations.  Since the last visit the patient notes no new problems or concerns.  The patient reports taking meds as prescribed.  When questioned about possible medication side effects, the patient notes none.    All other systems reviewed and were negative except she admits to depression/grief...her live-in fiance died in June 19, 2023. she states she has no interest in leaving her home  Preventive Screening-Counseling & Management  Alcohol-Tobacco     Smoking Status: current     Smoking Cessation Counseling: yes     Packs/Day: <0.25     Year Quit: 06-18-2008  Comments: smokes once in a while  Current Problems (verified): 1)  Hyperlipidemia  (ICD-272.4) 2)  Carotid Bruit, Left  (ICD-785.9) 3)  Abnormal Chest Xray  (ICD-793.1) 4)  Chronic Obstructive Pulmonary Disease, Acute Exacerbation  (ICD-491.21) 5)  Insomnia-sleep Disorder-unspec  (ICD-780.52) 6)  Colonic Polyps, Hx of  (ICD-V12.72) 7)  Tobacco Use  (ICD-305.1) 8)  Hypothyroidism  (ICD-244.9) 9)  COPD  (ICD-496)  Current Medications (verified): 1)  Levothyroxine Sodium 100 Mcg  Tabs (Levothyroxine Sodium) .... Take 1 Tablet By Mouth Once A Day 2)  Ultravate 0.05 %  Oint (Halobetasol Propionate) .... Apply 1 A Small Amount To Affected Area  Twice A Day 3)  Temazepam 15 Mg  Caps (Temazepam) .... Take One At Bedtime As Needed No More Than 4 Times Weekly 4)  Simvastatin 20 Mg Tabs (Simvastatin) .... Take 1 Tablet By Mouth At Bedtime 5)  Womens Multivitamin Plus  Tabs (Multiple Vitamins-Minerals) .... 1/2 Two Times A Day  Allergies (verified): No Known Drug Allergies  Past History:  Past Medical History: Last updated: 08/04/2008 COPD Hypothyroidism smoker Colonic polyps, hx of-adenomatous with high grade glandular dysplasia  ? 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  thin Head:  normocephalic and atraumatic.   Neck:  No deformities, masses, or tenderness noted. Chest Wall:  No deformities, masses, or tenderness noted. Lungs:  normal respiratory effort and no intercostal retractions.  bilateral wheeze prolonged expiratory time Heart:  normal rate and regular rhythm.   Abdomen:  soft and non-tender.   Msk:  No deformity or scoliosis noted of thoracic or lumbar spine.   Neurologic:  cranial nerves II-XII intact and gait normal.     Impression & Recommendations:  Problem # 1:  HYPERLIPIDEMIA (ICD-272.4)  check labs today Her updated medication list for this problem includes:    Simvastatin 20 Mg Tabs (Simvastatin) .Marland Kitchen... Take 1 tablet  by mouth at bedtime  Labs Reviewed: SGOT: 17 (07/02/2009)   SGPT: 9 (07/02/2009)   HDL:48.90 (07/02/2009), 45.00 (12/03/2008)  LDL:DEL (08/04/2008), DEL (06/01/2006)  Chol:207 (07/02/2009), 206 (12/03/2008)  Trig:126.0 (07/02/2009), 126.0 (12/03/2008)  Orders: Venipuncture ([address]) TLB-Hepatic/Liver Function Pnl (80076-HEPATIC) TLB-Lipid Panel (80061-LIPID)  Problem # 2:  CHRONIC OBSTRUCTIVE PULMONARY DISEASE,  stop all tobacco products  Problem # 3:  TOBACCO USE (ICD-305.1)  Encouraged smoking cessation and discussed different methods for smoking cessation.  she uses home 02 at night  Problem # 4:  HYPOTHYROIDISM (ICD-244.9)  Her updated medication list for this problem includes:    Levothyroxine Sodium 100 Mcg Tabs (Levothyroxine sodium) .Marland Kitchen... Take 1 tablet by mouth once a day  Labs Reviewed: TSH: 0.30 (07/02/2009)    Chol: 207 (07/02/2009)   HDL: 48.90 (07/02/2009)   LDL: DEL (08/04/2008)   TG: 126.0 (07/02/2009)  Orders: TLB-TSH (Thyroid Stimulating Hormone) (84443-TSH)  Problem # 5:  DEPRESSION (ICD-311) Assessment: New exacerbated by grief start meds side effects discussed Her updated medication list for this problem includes:    Citalopram Hydrobromide 20 Mg Tabs (Citalopram hydrobromide) .Marland Kitchen... Take one tablet by mouth daily  Orders: Venipuncture ([address]) TLB-Hepatic/Liver Function Pnl (80076-HEPATIC) TLB-Lipid Panel (80061-LIPID)  Complete Medication List: 1)  Levothyroxine Sodium 100 Mcg Tabs (Levothyroxine sodium) .... Take 1 tablet by mouth once a day 2)  Ultravate 0.05 % Oint (Halobetasol propionate) .... Apply 1 a small amount to affected area  twice a day 3)  Temazepam 15 Mg Caps (Temazepam) .... Take one at bedtime as needed no more than 4 times weekly 4)  Simvastatin 20 Mg Tabs (Simvastatin) .... Take 1 tablet by mouth at bedtime 5)  Womens Multivitamin Plus Tabs (Multiple vitamins-minerals) .... 1/2 two times a day 6)  Citalopram Hydrobromide 20 Mg  Tabs (Citalopram hydrobromide) .... Take one tablet by mouth daily  Patient Instructions: 1)  see me 6 WEEKS 2)  Tobacco is very bad for your health and your loved ones! You Should stop smoking!. 3)  Stop Smoking Tips: Choose a Quit date. Cut down before the Quit date. decide what you will do as a substitute when you feel the urge to smoke(gum,toothpick,exercise). Prescriptions: CITALOPRAM HYDROBROMIDE 20 MG TABS (CITALOPRAM HYDROBROMIDE) take one tablet by mouth daily  #90 x 3   Entered and Authorized by:   Birdie Sons MD   Signed by:   Birdie Sons MD on 08/20/2009   Method used:   Electronically to        [facility] [address] (retail)       [address]       [place], [place]  [address]       Ph: [phone] or [phone]       Fax: [phone]   RxID:   [id number] SIMVASTATIN 20 MG TABS (SIMVASTATIN) Take 1 tablet by mouth at bedtime  #90 x 3   Entered by:   Gladis Riffle, RN   Authorized by:   Birdie Sons MD   Signed by:  Gladis Riffle, RN on 08/20/2009   Method used:   [facility]       [address]       [place], [place]  [address]       Ph: [phone] or [phone]       Fax: [phone]   RxID:   [id number] ULTRAVATE 0.05 % OINT (HALOBETASOL PROPIONATE) Apply 1 a small amount to affected area  twice a day  #50grams x 2   Entered by:   Gladis Riffle, RN   Authorized by:   Birdie Sons MD   Signed by:   Gladis Riffle, RN on 08/20/2009   Method used:   Electronically to        [facility] [address] (retail)       [address]       [place], [place]  [address]       Ph: [phone] or [phone]       Fax: [phone]   RxID:   [id number]   Appended Document: Orders Update     Clinical Lists Changes  Orders: Added new Service order of Prescription Created Electronically [id number]) - Signed

## 2010-08-16 NOTE — Assessment & Plan Note (Signed)
Summary: Pulmonary/ ext summary f/u ov  taper off Advair   Primary Provider/Referring Provider:  Birdie Sons MD  CC:  6 wk followup with PFT's and cxr.  Pt states that her breathing is fine and her cough has resolved.  She denies any complaints today.Marland Kitchen  History of Present Illness: 67 yowf quit smoking October 31 2009 with admit aecopd to [facility]  Nov 17, 2009 Post hospital followup.  Pt states that her breathing has improved.  She c/o dry cough and nasal congestion x 4 days.  Denies any fever.  best she's been in 14 years in terms of activty tolerance.    December 22, 2009 6 wk followup with PFT's and cxr.  Pt states that her breathing is fine and her cough has resolved.  No limitations, no nocturnal symptoms. Pt denies any significant sore throat, dysphagia, itching, sneezing,  nasal congestion or excess secretions,  fever, chills, sweats, unintended wt loss, pleuritic or exertional cp, hempoptysis, change in activity tolerance  orthopnea pnd or leg swelling Pt also denies any obvious fluctuation in symptoms with weather or environmental change or other alleviating or aggravating factors.       Allergies: No Known Drug Allergies  Past History:  Past Medical History: COPD     - 12/22/09 PFT's FEV1  1.32 (77%) ratio 50 and DLC0 70 Hypothyroidism smoker Colonic polyps, hx of-adenomatous with high grade glandular dysplasia  ? psoriasis / rash on her upper back Hyperlipidemia Multiple Pulmonary Nodules ? MAI...........................................Marland KitchenWert     - CT 10/31/09 MPN with ? early cavitation "nl flora"     - CXR December 22, 2009 copd only  Vital Signs:  Patient profile:   70 year old female Weight:      89 pounds O2 Sat:      95 % on Room air Temp:     97.1 degrees F oral Pulse rate:   81 / minute BP sitting:   120 / 78  (left arm)  Vitals Entered By: Vernie Murders (December 22, 2009 1:19 PM)  O2 Flow:  Room air  Physical Exam  Additional Exam:  wt 81 > 80 Nov 17, 2009 > 89 December 22, 2009  thin amb wf nad HEENT mild turbinate edema. Oropharynx with full  dentures and mild thrush or excess pnd or cobblestoning.  No JVD or cervical adenopathy. Mild accessory muscle hypertrophy. Trachea midline, nl thryroid. Chest was hyperinflated by percussion with diminished breath sounds and moderate increased exp time without wheeze. Hoover sign positive at mid inspiration. Regular rate and rhythm without murmur gallop or rub or increase P2 or edema.  Abd: no hsm, nl excursion. Ext warm without cyanosis or clubbing.     CXR  Procedure date:  12/22/2009  Findings:        Comparison: 11/24/2009   Findings: The cardiac silhouette, mediastinal and hilar contours are within normal limits and stable.  There are stable chronic changes of COPD with dense biapical pleural and parenchymal scarring.  No definite acute overlying pulmonary process.  The bony thorax is intact.   IMPRESSION: Chronic lung changes/COPD.  No acute pulmonary findings  Impression & Recommendations:  Problem # 1:  COPD (ICD-496) GOLD II moderate but no symptoms largely attributable to lack of smoking, not choice of inhalers.  Since treatment is all symptom based will ty to wean off advair and see if flares.  Since DLC0 70 doubt she needs 02 at this point  Each maintenance  medication was reviewed in detail including most importantly the difference between maintenance prns and under what circumstances the prns are to be used.  In addition, these two groups (for which the patient should keep up with refills) were distinguished from a third group :  meds that are used only short term with the intent to complete a course of therapy and then not refill them.  The med list was then fully reconciled and reorganized to reflect this important distinction.   Problem # 2:  ABNORMAL CHEST XRAY (ICD-793.1)  No definite nodules, gaining wt with no symptoms.  could have low grade MAI but no need to address at this point.  Certainly this is not MTB or met ca.  Pulmonary f/u can be as needed.   I took this opportunity to re-emphasize  the consequences of smoking in airway disorders based on all the data we have from the multiple national lung health studies indicating that smoking cessation, not choice of inhalers or physicians, is the most important aspect of her care.    Orders: Est. Patient Level IV (04540)  Other Orders: Misc. Referral (Misc. Ref) T-2 View CXR (71020TC)  Patient Instructions: 1)  Try taper off Advair by stopping the night time dose first and then in two weeks stop am dose, if worsen restart the advair. 2)  Continue on spiriva and off cigarettes 3)  Pulmonary f/u can be as needed.   CXR  Procedure date:  12/22/2009  Findings:        Comparison: 11/24/2009   Findings: The cardiac silhouette, mediastinal and hilar contours are within normal limits and stable.  There are stable chronic changes of COPD with dense biapical pleural and parenchymal scarring.  No definite acute overlying pulmonary process.  The bony thorax is intact.   IMPRESSION: Chronic lung changes/COPD.  No acute pulmonary findings

## 2010-08-16 NOTE — Miscellaneous (Signed)
Summary: Certification and Plan of Care/[facility]  Certification and Plan of Care/[facility]   Imported By: Maryln Gottron 12/07/2009 14:40:21  _____________________________________________________________________  External Attachment:    Type:   Image     Comment:   External Document

## 2010-08-16 NOTE — Assessment & Plan Note (Signed)
Summary: 6 wk rov/njr   Vital Signs:  Patient profile:   70 year old female Weight:      91 pounds Temp:     97.7 degrees F oral Pulse rate:   80 / minute Pulse rhythm:   regular Resp:     20 per minute BP sitting:   130 / 78  (left arm) Cuff size:   regular  Vitals Entered By: Gladis Riffle, RN (December 28, 2009 11:01 AM) CC: 6 week rov Is Patient Diabetic? No Comments no O2 at home anymore   Primary Care Provider:  Birdie Sons MD  CC:  6 week rov.  History of Present Illness:  Follow-Up Visit      This is a 70 year old woman who presents for Follow-up visit.  The patient denies chest pain and palpitations.  Since the last visit the patient notes no new problems or concerns and being seen by a specialist.  The patient reports taking meds as prescribed.  When questioned about possible medication side effects, the patient notes none.   Reviewed dr wert's note  Preventive Screening-Counseling & Management  Alcohol-Tobacco     Smoking Status: quit     Smoking Cessation Counseling: yes     Packs/Day: <0.25     Year Quit: 2011  Current Problems (verified): 1)  Depression  (ICD-311) 2)  Hyperlipidemia  (ICD-272.4) 3)  Carotid Bruit, Left  (ICD-785.9) 4)  Abnormal Chest Xray  (ICD-793.1) 5)  Insomnia-sleep Disorder-unspec  (ICD-780.52) 6)  Colonic Polyps, Hx of  (ICD-V12.72) 7)  Hypothyroidism  (ICD-244.9) 8)  COPD  (ICD-496)  Current Medications (verified): 1)  Levothyroxine Sodium 75 Mcg Tabs (Levothyroxine Sodium) .... Take 1 Tablet By Mouth Once A Day 2)  Simvastatin 20 Mg Tabs (Simvastatin) .... Take 1 Tablet By Mouth At Bedtime 3)  Womens Multivitamin Plus  Tabs (Multiple Vitamins-Minerals) .... 1/2 Two Times A Day 4)  Spiriva Handihaler 18 Mcg Caps (Tiotropium Bromide Monohydrate) .... Inhale Once Daily As Directed 5)  Temazepam 15 Mg  Caps (Temazepam) .... Take One At Bedtime As Needed No More Than 4 Times Weekly 6)  Mucinex 600 Mg Xr12h-Tab (Guaifenesin) .... Two  Times A Day As Needed 7)  Ultravate 0.05 % Oint (Halobetasol Propionate) .... Apply 1 A Small Amount To Affected Area  Twice A Day  Allergies (verified): No Known Drug Allergies  Past History:  Past Medical History: Last updated: 12/22/2009 COPD     - 12/22/09 PFT's FEV1  1.32 (77%) ratio 50 and DLC0 70 Hypothyroidism smoker Colonic polyps, hx of-adenomatous with high grade glandular dysplasia  ? psoriasis / rash on her upper back Hyperlipidemia Multiple Pulmonary Nodules ? MAI...........................................Marland KitchenWert     - CT 10/31/09 MPN with ? early cavitation "nl flora"     - CXR December 22, 2009 copd only  Past Surgical History: Last updated: Jun 14, 2008 ventral hernia Cataract extraction, bilateral Hysterectomy, bladder tacking  1998 Oophorectomy, BSO  1998   Family History: Last updated: 06-14-2008 Mother died of colon cancer Father had COPD  Social History: Last updated: 11/17/2009 Single (boyfriend) Alcohol use-no Regular exercise-no Retired Office manager guard Former smoker since April 2011 after admit to hospital  Risk Factors: Exercise: no (06/10/2007)  Risk Factors: Smoking Status: quit (12/28/2009) Packs/Day: <0.25 (12/28/2009)  Review of Systems       All other systems reviewed and were negative   Physical Exam  General:  alert and well-developed.   Head:  normocephalic and atraumatic.   Eyes:  pupils equal and pupils round.   Neck:  No deformities, masses, or tenderness noted. Chest Wall:  No deformities, masses, or tenderness noted. Lungs:  normal respiratory effort and no intercostal retractions.  prolonged expiratory time no wheeze Heart:  normal rate and regular rhythm.   Abdomen:  soft and non-tender.  thin Msk:  No deformity or scoliosis noted of thoracic or lumbar spine.   Neurologic:  cranial nerves II-XII intact and gait normal.     Impression & Recommendations:  Problem # 1:  COPD (ICD-496) has QUIT  smoking---congratulated continue current medications stay off cigarrettes she voices understanding Her updated medication list for this problem includes:    Spiriva Handihaler 18 Mcg Caps (Tiotropium bromide monohydrate) ..... Inhale once daily as directed  Problem # 2:  HYPERLIPIDEMIA (ICD-272.4) continue current medications  Her updated medication list for this problem includes:    Simvastatin 20 Mg Tabs (Simvastatin) .Marland Kitchen... Take 1 tablet by mouth at bedtime  Labs Reviewed: SGOT: 23 (08/20/2009)   SGPT: 14 (08/20/2009)   HDL:64.30 (08/20/2009), 48.90 (07/02/2009)  LDL:DEL (08/04/2008), DEL (06/01/2006)  Chol:202 (08/20/2009), 207 (07/02/2009)  Trig:72.0 (08/20/2009), 126.0 (07/02/2009)  Problem # 3:  HYPOTHYROIDISM (ICD-244.9)  check TSH today Her updated medication list for this problem includes:    Levothyroxine Sodium 75 Mcg Tabs (Levothyroxine sodium) .Marland Kitchen... Take 1 tablet by mouth once a day  Labs Reviewed: TSH: 0.06 (08/20/2009)    Chol: 202 (08/20/2009)   HDL: 64.30 (08/20/2009)   LDL: DEL (08/04/2008)   TG: 72.0 (08/20/2009)  Orders: Venipuncture (16109) TLB-TSH (Thyroid Stimulating Hormone) (84443-TSH)  Problem # 4:  PSORIASIS (ICD-696.1) hx of psoriasis scalp most acitve will use ultravate to affected areas  Complete Medication List: 1)  Levothyroxine Sodium 75 Mcg Tabs (Levothyroxine sodium) .... Take 1 tablet by mouth once a day 2)  Simvastatin 20 Mg Tabs (Simvastatin) .... Take 1 tablet by mouth at bedtime 3)  Womens Multivitamin Plus Tabs (Multiple vitamins-minerals) .... 1/2 two times a day 4)  Spiriva Handihaler 18 Mcg Caps (Tiotropium bromide monohydrate) .... Inhale once daily as directed 5)  Temazepam 15 Mg Caps (Temazepam) .... Take one at bedtime as needed no more than 4 times weekly 6)  Ultravate 0.05 % Oint (Halobetasol propionate) .... Apply 1 a small amount to affected area  twice a day 7)  Benazepril Hcl 10 Mg Tabs (Benazepril hcl) .Marland Kitchen.. 1 tablet  daily  Patient Instructions: 1)  Please schedule a follow-up appointment in 2 months. Prescriptions: BENAZEPRIL HCL 10 MG TABS (BENAZEPRIL HCL) 1 tablet daily  #90 x 3   Entered and Authorized by:   Birdie Sons MD   Signed by:   Birdie Sons MD on 12/28/2009   Method used:   Electronically to        [facility] [address] [address] (retail)       [address]       [place], [place]  [address]       Ph: [phone] or [phone]       Fax: [phone]   RxID:   [id number]

## 2010-08-16 NOTE — Miscellaneous (Signed)
Summary: Documentation of Face to Face [facility]  Documentation of Face to Face [facility]   Imported By: Maryln Gottron 11/22/2009 12:29:57  _____________________________________________________________________  External Attachment:    Type:   Image     Comment:   External Document

## 2010-08-16 NOTE — Progress Notes (Signed)
Summary: ? regarding Benazepril?  Phone Note Call from Patient   Caller: Patient Call For: Birdie Sons MD Summary of Call: Pt got Benazepril sent in yesterday, and she states she has never been on BP meds. [phone] Initial call taken by: Lynann Beaver CMA,  December 29, 2009 8:58 AM  Follow-up for Phone Call        do not take benazepril see med list Follow-up by: Birdie Sons MD,  December 29, 2009 10:41 AM     Appended Document: ? regarding Benazepril? Pt. notified.

## 2010-08-16 NOTE — Miscellaneous (Signed)
Summary: Orders Update pft charges  Clinical Lists Changes  Orders: Added new Service order of Carbon Monoxide diffusing w/capacity (94720) - Signed Added new Service order of Lung Volumes (94240) - Signed Added new Service order of Spirometry (Pre & Post) (94060) - Signed 

## 2010-09-25 IMAGING — CR DG CHEST 2V
2 series · 2 of 2 positions shown · non-contrast
Comparison: CT chest and chest x-ray 10/31/2009

CLINICAL DATA: Productive cough.

CHEST - 2 VIEW

[view not recorded (1 of 2)]
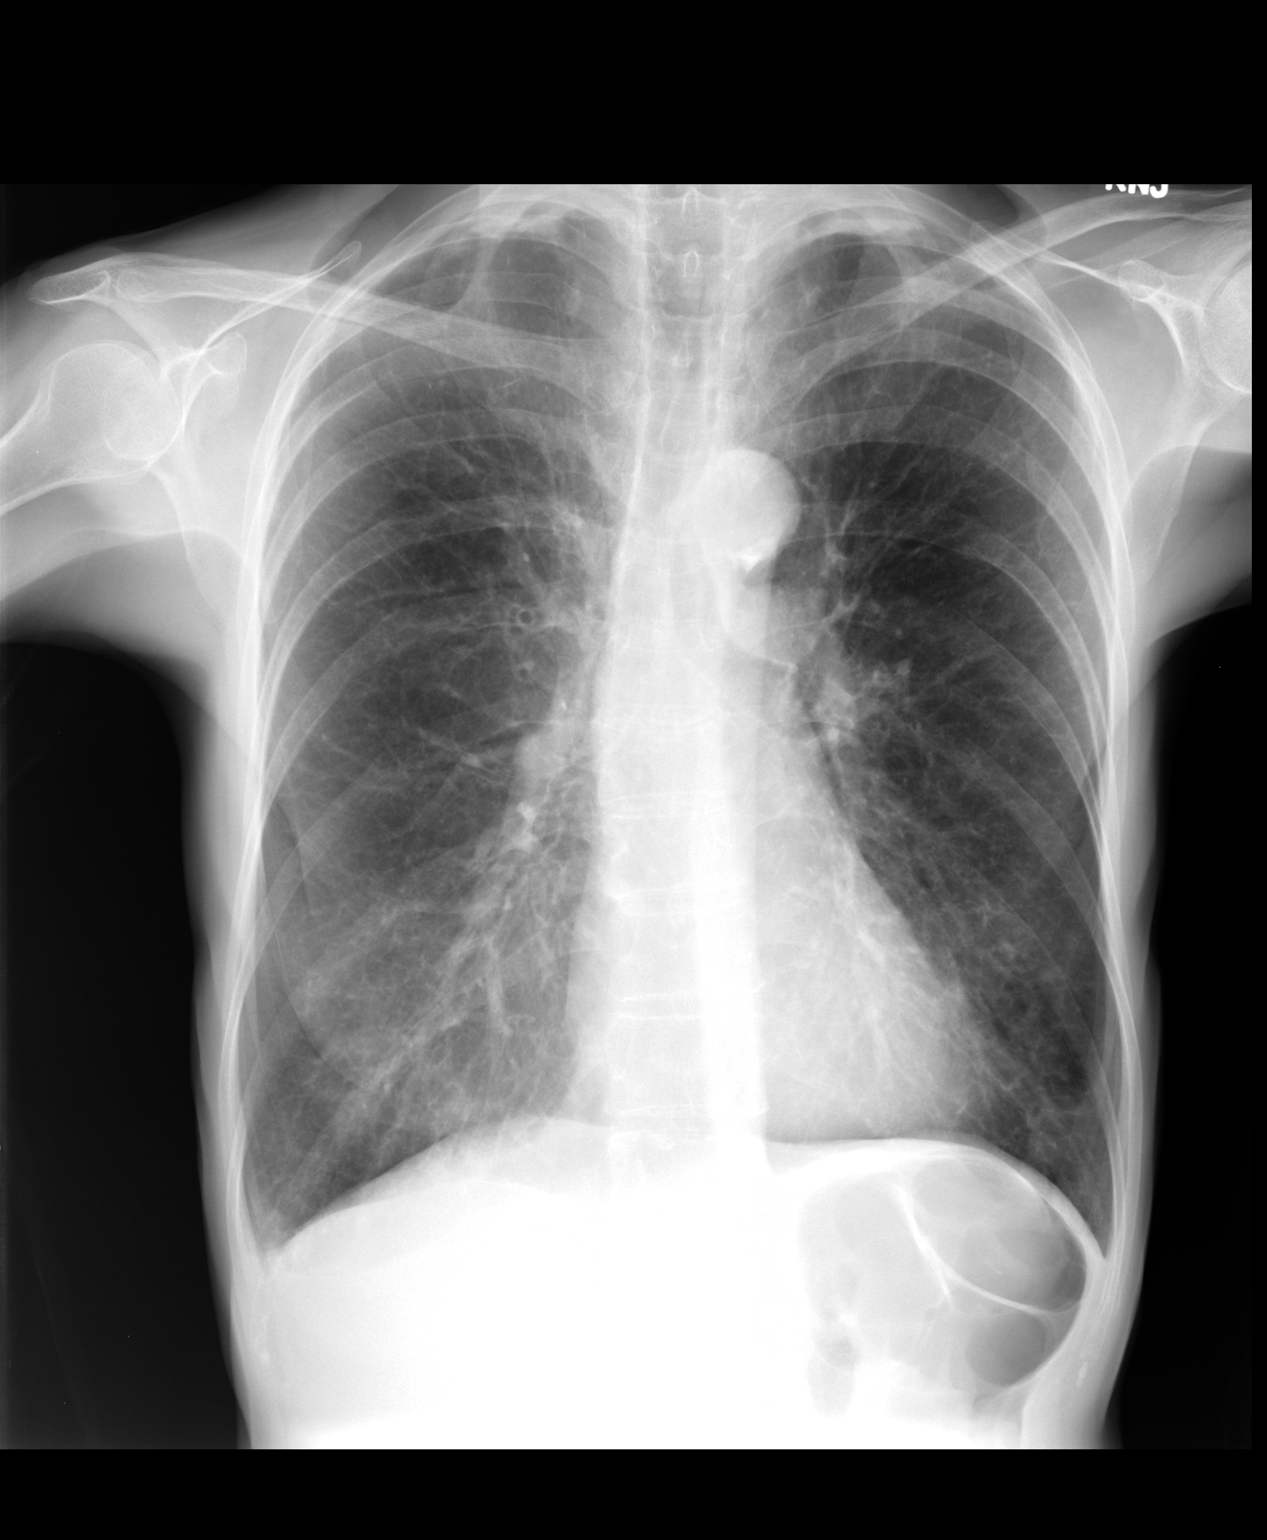

[view not recorded (2 of 2)]
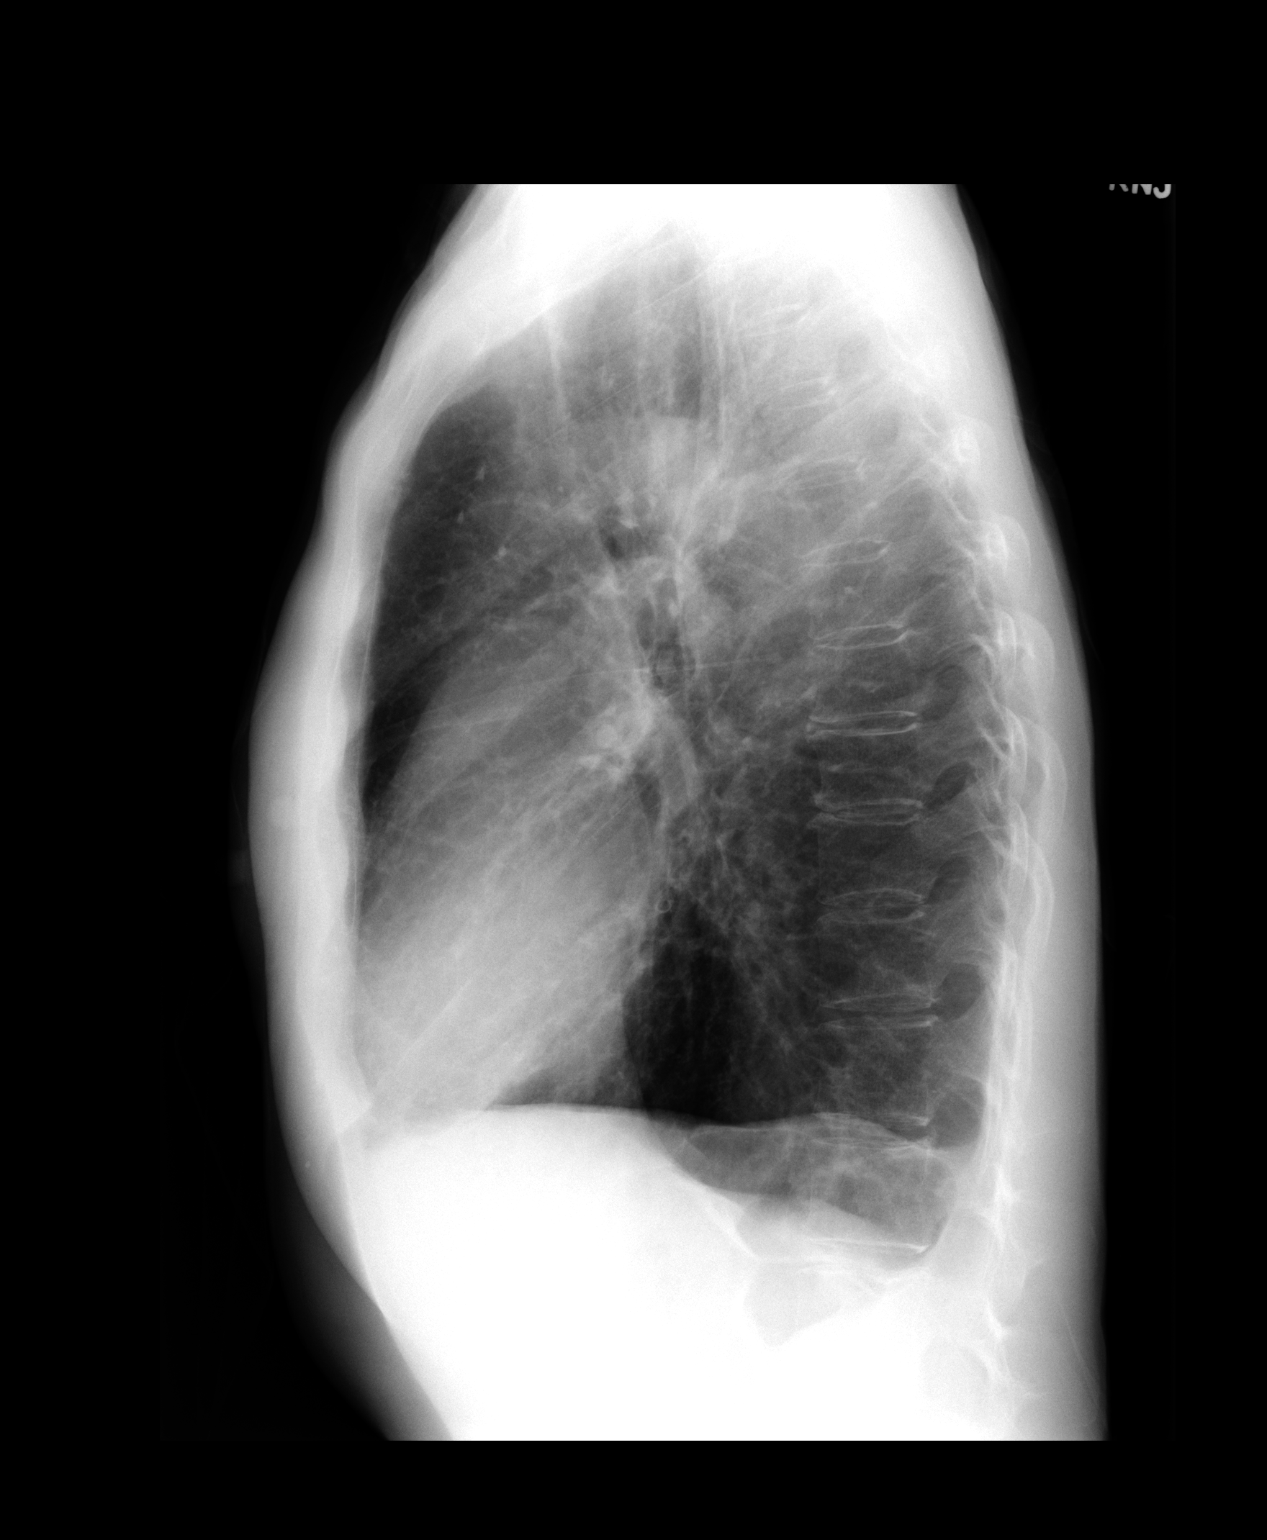

[2 of 2 positions shown; findings below may reference images not displayed]

FINDINGS: Trachea is midline.  Heart size normal.  Biapical pleural
parenchymal scarring.  Emphysema.  Lungs are clear.  No pleural
fluid.
IMPRESSION: Emphysema without acute finding.

## 2010-10-04 LAB — CBC
HCT: 38.4 % (ref 36.0–46.0)
Hemoglobin: 11.3 g/dL — ABNORMAL LOW (ref 12.0–15.0)
Hemoglobin: 12.9 g/dL (ref 12.0–15.0)
Hemoglobin: 14.1 g/dL (ref 12.0–15.0)
MCHC: 32.9 g/dL (ref 30.0–36.0)
MCHC: 33 g/dL (ref 30.0–36.0)
MCHC: 34 g/dL (ref 30.0–36.0)
MCV: 93.4 fL (ref 78.0–100.0)
MCV: 94.8 fL (ref 78.0–100.0)
MCV: 95.2 fL (ref 78.0–100.0)
MCV: 95.9 fL (ref 78.0–100.0)
Platelets: 340 10*3/uL (ref 150–400)
RBC: 3.62 MIL/uL — ABNORMAL LOW (ref 3.87–5.11)
RBC: 4.04 MIL/uL (ref 3.87–5.11)
RDW: 14.2 % (ref 11.5–15.5)
RDW: 14.5 % (ref 11.5–15.5)
WBC: 5.5 10*3/uL (ref 4.0–10.5)

## 2010-10-04 LAB — BLOOD GAS, ARTERIAL
Acid-Base Excess: 5.9 mmol/L — ABNORMAL HIGH (ref 0.0–2.0)
Bicarbonate: 30.5 mEq/L — ABNORMAL HIGH (ref 20.0–24.0)
O2 Content: 2 L/min
TCO2: 26.8 mmol/L (ref 0–100)
pCO2 arterial: 45.7 mmHg — ABNORMAL HIGH (ref 35.0–45.0)
pO2, Arterial: 58.3 mmHg — ABNORMAL LOW (ref 80.0–100.0)

## 2010-10-04 LAB — DIFFERENTIAL
Basophils Absolute: 0 10*3/uL (ref 0.0–0.1)
Eosinophils Absolute: 0 10*3/uL (ref 0.0–0.7)
Eosinophils Relative: 0 % (ref 0–5)
Monocytes Relative: 5 % (ref 3–12)
Neutrophils Relative %: 86 % — ABNORMAL HIGH (ref 43–77)

## 2010-10-04 LAB — CK TOTAL AND CKMB (NOT AT ARMC): CK, MB: 1.8 ng/mL (ref 0.3–4.0)

## 2010-10-04 LAB — BRAIN NATRIURETIC PEPTIDE: Pro B Natriuretic peptide (BNP): 33.7 pg/mL (ref 0.0–100.0)

## 2010-10-04 LAB — CULTURE, BLOOD (ROUTINE X 2)

## 2010-10-04 LAB — URINALYSIS, ROUTINE W REFLEX MICROSCOPIC
Bilirubin Urine: NEGATIVE
Ketones, ur: 40 mg/dL — AB

## 2010-10-04 LAB — BASIC METABOLIC PANEL
BUN: 4 mg/dL — ABNORMAL LOW (ref 6–23)
BUN: 4 mg/dL — ABNORMAL LOW (ref 6–23)
CO2: 29 mEq/L (ref 19–32)
CO2: 29 mEq/L (ref 19–32)
CO2: 31 mEq/L (ref 19–32)
CO2: 34 mEq/L — ABNORMAL HIGH (ref 19–32)
Chloride: 100 mEq/L (ref 96–112)
Chloride: 101 mEq/L (ref 96–112)
Chloride: 103 mEq/L (ref 96–112)
Creatinine, Ser: 0.45 mg/dL (ref 0.4–1.2)
Creatinine, Ser: 0.47 mg/dL (ref 0.4–1.2)
Creatinine, Ser: 0.47 mg/dL (ref 0.4–1.2)
GFR calc Af Amer: >60 mL/min (ref >60)
GFR calc non Af Amer: >60 mL/min (ref >60)
Glucose, Bld: 112 mg/dL — ABNORMAL HIGH (ref 70–99)
Glucose, Bld: 181 mg/dL — ABNORMAL HIGH (ref 70–99)
Potassium: 2.9 mEq/L — ABNORMAL LOW (ref 3.5–5.1)

## 2010-10-04 LAB — CULTURE, RESPIRATORY W GRAM STAIN: Culture: NORMAL

## 2010-10-04 LAB — CARDIAC PANEL(CRET KIN+CKTOT+MB+TROPI)
Relative Index: INVALID (ref 0.0–2.5)
Troponin I: 0.01 ng/mL (ref 0.00–0.06)

## 2010-10-04 LAB — URINE CULTURE: Colony Count: NO GROWTH

## 2010-10-04 LAB — MRSA PCR SCREENING: MRSA by PCR: NEGATIVE

## 2010-10-04 LAB — SEDIMENTATION RATE: Sed Rate: 60 mm/hr — ABNORMAL HIGH (ref 0–22)

## 2010-11-01 ENCOUNTER — Other Ambulatory Visit: Payer: Self-pay | Admitting: Internal Medicine

## 2010-11-01 ENCOUNTER — Encounter: Payer: Self-pay | Admitting: Internal Medicine

## 2010-11-02 ENCOUNTER — Ambulatory Visit (INDEPENDENT_AMBULATORY_CARE_PROVIDER_SITE_OTHER): Payer: Medicare Other | Admitting: Internal Medicine

## 2010-11-02 ENCOUNTER — Encounter: Payer: Self-pay | Admitting: Internal Medicine

## 2010-11-02 DIAGNOSIS — E039 Hypothyroidism, unspecified: Secondary | ICD-10-CM

## 2010-11-02 DIAGNOSIS — E785 Hyperlipidemia, unspecified: Secondary | ICD-10-CM

## 2010-11-02 DIAGNOSIS — J449 Chronic obstructive pulmonary disease, unspecified: Secondary | ICD-10-CM

## 2010-11-02 NOTE — Assessment & Plan Note (Signed)
Continue current replacement dose. Will followup in 3-4 months.

## 2010-11-02 NOTE — Assessment & Plan Note (Signed)
Currently not smoking She is not having any sxs

## 2010-11-02 NOTE — Assessment & Plan Note (Signed)
She admits to poor compliance. I've asked her to take her medications everyday as scheduled. Followup in 3 months.

## 2010-11-02 NOTE — Progress Notes (Signed)
  Subjective:    Patient ID: Heidi [name], female    DOB: 10/05/1940, 70 y.o.   MRN: [id number]  HPI Hypothyroid--taking replacement  Psoriasis---ost bothersome on scalp---currently not a problem  Lipids--tolerating meds but admits to regular noncompliance  She describes a febrile illness at [facility]  Past Medical History  Diagnosis Date  . COPD (chronic obstructive pulmonary disease)   . Hypothyroid   . Colon polyps   . Hyperlipidemia   . Pulmonary nodule    Past Surgical History  Procedure Date  . Hernia repair   . Cataract extraction   . Abdominal hysterectomy 1998    BSO  . Bladder surgery     tack    reports that she quit smoking about a year ago. She does not have any smokeless tobacco history on file. She reports that she does not drink alcohol. Her drug history not on file. family history includes COPD in her father and Cancer in her mother. No Known Allergies    Review of Systems  patient denies chest pain, shortness of breath, orthopnea. Denies lower extremity edema, abdominal pain, change in appetite, change in bowel movements. Patient denies rashes, musculoskeletal complaints. No other specific complaints in a complete review of systems.      Objective:   Physical Exam  Thin female in no acute distress. HEENT exam atraumatic, normocephalic, extraocular muscles are intact. Neck is supple. No jugular venous distention no thyromegaly. Chest clear to auscultation without increased work of breathing. Cardiac exam S1 and S2 are regular. Abdominal exam active bowel sounds, soft, nontender. Extremities no edema. Neurologic exam she is alert without any motor sensory deficits. Gait is normal.        Assessment & Plan:

## 2010-11-10 ENCOUNTER — Other Ambulatory Visit: Payer: Self-pay | Admitting: Internal Medicine

## 2010-11-10 DIAGNOSIS — G4700 Insomnia, unspecified: Secondary | ICD-10-CM

## 2010-11-11 ENCOUNTER — Other Ambulatory Visit: Payer: Self-pay | Admitting: Internal Medicine

## 2010-11-11 NOTE — Telephone Encounter (Signed)
Called pt and told pt med was never denied but I called in to [facility] in [place]

## 2010-11-11 NOTE — Telephone Encounter (Signed)
Pt called and said that she had called in refill of Temazepam 15mg  to [facility] in [place] and the pharmacist told pt that this was denied by provider. Pt is req that this be call in asap today. Pt out of medicine.  Pls call in to [facility] [phone].

## 2010-11-29 NOTE — Discharge Summary (Signed)
[name], [name]                ACCOUNT NO.:  [id number]   MEDICAL RECORD NO.:  [id number]          PATIENT TYPE:  INP   LOCATION:  1431                         FACILITY:  [facility]   PHYSICIAN:  Rosalyn Gess. Norins, MD  DATE OF BIRTH:  1941/05/15   DATE OF ADMISSION:  06/11/2008  DATE OF DISCHARGE:  06/16/2008                               DISCHARGE SUMMARY   ADMISSION DIAGNOSES:  1. Chronic obstructive pulmonary disease with acute exacerbation.  2. Hypothyroid disease.  3. Tobacco abuse.  4. Acute diarrhea.  5. Left carotid bruit.   DISCHARGE DIAGNOSES:  1. Chronic obstructive pulmonary disease with acute exacerbation.  2. Hypothyroid disease.  3. Tobacco abuse.  4. Acute diarrhea.  5. Left carotid bruit.   CONSULTANTS:  None.   PROCEDURES:  1. Chest X-Ray: Performed June 11, 2008 which revealed prominent      COPD with scattered scarring and prominence of interstitium with      daily nodular components, two nodular densities projecting over the      mid to lower lung field which may be nipple shadows.  Scattered      scarring is present, difficult to exclude pulmonary nodules.  2. Single view abdomen performed November 26 which showed mild      transverse colon gastric ileus and COPD.  3. CT of the chest with contrast which showed changes of COPD/slash      emphysema with chronic bronchitis.  Small patchy areas of      inflammation, infection or scarring in both lungs.  Diffuse fatty      infiltration of the liver.  Left adrenal hyperplasia.   HISTORY OF PRESENT ILLNESS:  The patient to 70 year old woman who  presented to the emergency department for shortness of breath, chest  tightness and increased sputum over the previous 3-5 days.  She has a  history of known severe COPD/emphysema.  She experiences dyspnea with  most activities.  The patient did report having a fever at home to 103.  Her cough was productive but it was difficult to bring up sputum.  Her  sputum  was purulent in appearance.   The patient did complain of constipation for the past several weeks but  had experienced a multiple watery stools on the day of admission.   The patient has a history of tobacco abuse and continues to smoke  despite extensive counseling by her primary care physician.   Please see the EMR H and P for past medical history, family history,  social history and admission physical exam.   HOSPITAL COURSE:  1. Pulmonary: The patient was admitted with COPD exacerbation.  She      was treated with IV Solu-Medrol and IV Avelox.  On the 27th she was      switched to p.o. Avelox and given a trial of prednisone at 30 mg      p.o. t.i.d.  Unfortunately she had exacerbation of her wheezing and      was continued on p.o. prednisone but also was restarted on IV Solu-      Medrol for the next  2 days.  On this regimen she did improve.  On      the 30th she was switched to just p.o. prednisone alone as well as      p.o. antibiotics.  She remained stable on this regimen and was felt      to be ready for discharge home.  Given her significant dyspnea and      hypoxemia in the hospital, the patient was a candidate for home      oxygen on a continuous basis.  2. GI: The patient had ongoing constipation.  She was given milk of      magnesia with good results. The patient would need to continue      taking milk of magnesia at 15 mL q.h.s. to maintain a regular bowel      habit.  May stop if she has chronic loose stools.   The patient's exacerbation being markedly improved with her having no  wheezing with having adequate oxygen saturation on oxygen, she was felt  to be stable and ready for discharge home.   DISCHARGE EXAMINATION:  VITAL SIGNS:  Temperature of 97.4, blood  pressure 131/75, heart rate 87, respirations 18, O2 sat was 97% on 2  liters.  GENERAL APPEARANCE:  This is a very thin if not cachectic pink puffer  sitting up in bed in no acute distress with no increased  work of  breathing i.e. not using accessory muscles of respiration.  No neck  retractions were noted.  CHEST: The patient had no CVA tenderness.  LUNGS:  Patient is moving air but has shallow inspirations and distant  breath sounds.  No wheezing was appreciated.  CARDIOVASCULAR: The  patient's precordium was quiet.  She had distant heart sounds that were  regular.  ABDOMEN:  Soft with no tenderness.   FINAL LABORATORY:  TSH from November 27 was 0.366.  Blood cultures from  November 26 were no growth at 5 days.  Final CBC November 27 with a  white count of 8700 with hemoglobin 12.8 grams.  B-natriuretic peptide  from November 26 was 32.4.  Urinalysis the day of admission was  negative.   DISCHARGE MEDICATIONS:  The patient will continue her home medications  including  1. Levothyroxine 100 mcg daily.  2. Temazepam 15 mg at bedtime.  3. Ultravate for psoriasis.  4. We will add Avelox 400 mg p.o. daily for an additional 3 days.  5. Prednisone taper at 30 mg b.i.d. x3 days, 20 mg b.i.d. x3, days 30      mg daily x3 days, 20 mg daily x3 days, 10 mg daily x3 days and then      off.   DISPOSITION:  The patient is discharged home. Oxygen been delivered to  the home.  She will follow up with Dr. Cato Mulligan in 10-14 days.  The  patient's condition at time of discharge dictation is stable and  improved.      Rosalyn Gess Norins, MD  Electronically Signed     MEN/MEDQ  D:  06/16/2008  T:  06/16/2008  Job:  [id number]   cc:   Valetta Mole. Swords, MD  [address] [place]  [place] [address]

## 2010-12-02 NOTE — Discharge Summary (Signed)
[facility]  Patient:    Heidi [name], Heidi [name]                       MRN: [id number] Adm. Date:  [id number] Disc. Date: [id number] Attending:  Judie Petit                           Discharge Summary  FINAL DIAGNOSIS:  Acute bronchitis.  ADDITIONAL DIAGNOSES: 1. Chronic obstructive pulmonary disease. 2. Noncardiac chest pain. 3. Left carotid bruit. 4. Gastroesophageal reflux disease with esophageal stricture. 5. Tobacco abuse. 6. Glucose intolerance.  PROCEDURES:  Adenosine Cardiolite Stress Test, carotid artery Doppler evaluation.  DISCHARGE MEDICATIONS: 1. Combivent 2 puffs q.i.d. 2. Tequin 400 mg daily for 7 additional days.  HISTORY OF PRESENT ILLNESS:  The patient is a 70 year old white female, who was admitted with hypoxemia, purulent sputum production, leukocytosis, with the diagnosis of acute bronchitis.  The patient had been sick for approximately three weeks with bronchitic symptoms with the onset of purulent sputum and progressive shortness of breath.  In the ED, she was noted to have fever, hypoxemia, and a negative chest x-ray.  The patient was subsequently admitted for further evaluation and treatment of her acute bronchitis.  LABORATORY DATA AND HOSPITAL COURSE:  The patient was admitted to the hospital where she was treated with aggressive pulmonary toilet.  This included nebulizer treatments.  She was also placed initially on parenteral steroids, expectorants and due to her penicillin allergy, was treated with Tequin 400 mg IV.  Serial cardiac enzymes were obtained due to some atypical chest pain. Her hospital course was marked by steady improvement with diminution of her cough and sputum production.  She remained afebrile.  Prior to her hospital release, she had an Adenosine Cardiolite Stress Test performed that was negative.  Because of a left carotid bruit, had carotid Doppler evaluation performed that revealed some mild  nonobstructive disease.  Laboratory studies included blood cultures that were negative, serial cardiac enzymes negative, free T4 was 1.76.  White count on arrival was 19.5, but this improved prior to her discharge.  H&H was 13.7, 39.9.  Glucose was 127 and 195 on two occasions. Sedimentation rate was elevated at 33.  TSH was .319.  Due to some mild anemia, B12 and iron were obtained and were negative.  Chest x-ray revealed COPD with biapical scarring and no evidence of active disease. Electrocardiogram revealed normal sinus rhythm, left axis deviation, otherwise unremarkable.  DISPOSITION:  The patient will be discharged to return to her primary care physician within the next week.  She was instructed to stay out of work until November 12, 2000.  She will be discharged on the medications as mentioned above.  CONDITION ON DISCHARGE:  Improved. DD:  11/08/00 TD:  11/09/00 Job: [id number]

## 2010-12-02 NOTE — H&P (Signed)
[facility]  Patient:    Heidi [name], Heidi [name]                       MRN: [id number] Adm. Date:  [id number] Attending:  Judie Petit CC:         Valetta Mole. Swords, M.D., [place], [address]   History and Physical  HISTORY OF PRESENT ILLNESS:  Heidi [name] is a 70 year old white female admitted with hypoxemia, purulent bronchitis, and leukocytosis.  She has been sick approximately 3 weeks with protracted bronchitic symptoms. She has had frankly purulent sputum for 2 weeks. She has continued her cough syrup which she had initiated earlier. She has had progressive shortness of breath prompting the emergency room visit where she was found to have an elevated white count, fever, hypoxemia, with essentially negative x-ray.  PAST SURGICAL HISTORY:  Tonsillectomy and adenoidectomy. She has had a herniorrhaphy, total abdominal hysterectomy, and bilateral salpingo-oophorectomy for dysfunctional menses.  PAST MEDICAL HISTORY:  Questionable mini strokes in the past, three pregnancies and three deliveries, and esophageal strictures. Her last stricture was in 1999 and she has had dilations x 4. She has been told that it is unlikely that the esophagus can be dilated anymore. She had a colonoscopy in 1999.  ALLERGIES:  PENICILLIN caused a rash and apparent angioedema as a child.  FAMILY HISTORY:  Myocardial infarctions in her father, strokes in aunts, and colon cancer in her mother.  SOCIAL HISTORY:  She does not drink but smokes one pack every 1 1/2 days. She drinks one to two coffees a day and two or more teas per day. She denies the intake of peppermint or chocolate to any significant degree.  REVIEW OF SYSTEMS:  She has had intermittent dull chest pain which she describes as continuous. This has been treated with aspirin and Tylenol with some benefit. It has not been associated with exertion, nausea, or diaphoresis, although she has had some  substernal discomfort with climbing stairs at work. She had one episode of nausea and vomiting acutely x 1 several days ago associated with diaphoresis. She has dysphagia occasionally. Genitourinary review of systems and gynecologic review of systems is negative.  PHYSICAL EXAMINATION:  GENERAL:  She is thin and appears older than her stated age, with somewhat of sallow of complexion.  VITAL SIGNS:  Blood pressure is 107/68; initially pulse is 125, recheck was 107; respiratory rate was 20 to 28; and temperature was 100.7; O2 saturations were 94% on O2.  HEENT:  She has arteriole narrowing. Complete dentures are present.  NECK:  There is no lymphadenopathy about the head, neck, or axillae. The thyroid is not palpable. A faint left carotid bruit is present. A gallop-type cadence is noted without significant murmur.  CHEST:  Breath sounds are generally decreased suggestive of COPD. Rhonchi are noted over the anterior chest, greater on the right than the left.  ABDOMEN:  She is thin and the abdomen is soft. Aorta is palpable without an aneurysm.  EXTREMITIES:  She has no peripheral edema. The right dorsalis pedis pulses are decreased.  NEUROLOGICAL:  There are no localizing neurologic signs. She is oriented to time, place, and person; but she lacks insight as she sees no role here of the caffeine and tobacco in her recurrent GI problems.  Her husband asked when she would be leaving. I stated that depended on her underlying health. He stated that she was "thin but tough and in good health."  LABORATORY DATA:  EKG reveals sinus tachycardia with left axis deviation. There is ______ nonspecific T changes.  White blood count was 15,300, with hematocrit of 39.9, and 91 neutrophils, with 14% granulocytes suggesting a left shift. BMET revealed a glucose of 127 but was otherwise normal; pO2 was 59, normal pO2 would be 82. There was no CO2 retention.  X-ray showed emphysematous changes.  There is a vague, ill-defined infiltrate in the right costophrenic angle and in the area of the right breast. There are no significant changes on lateral film.  ASSESSMENT AND PLAN:  She is now admitted with purulent bronchitis with chest pain and dyspnea with demonstrated hypoxemia. She has history of esophageal strictures, status post dilatation. She also has a left carotid bruit which was discussed with her and her husband. It was recommended that this be followed as an outpatient when she is stable.  Because of the penicillin allergy which suggests angioedema, she will be placed on Tequin IV. She will receive IV steroids and aggressive pulmonary toilet. A nicotine patch will be applied as the acute on chronic lung problem cannot be treated adequately while she is still smoking. ______ will added empirically. A stool culture will be checked. Prealbumin will be performed as she does appear somewhat malnourished, and this will assess her state of nutrition. She will be placed on telemetry because of the tachycardia and chest pain and cardiac enzymes collected. DD:  11/03/00 TD:  11/05/00 Job: [id number]

## 2010-12-02 NOTE — Assessment & Plan Note (Signed)
[facility]                         GASTROENTEROLOGY OFFICE NOTE   Heidi [name], Heidi [name]                       MRN:          [id number]  DATE:10/15/2006                            DOB:          1941-03-15    Heidi [name] is a 70 year old white female who underwent screening  colonoscopy at the referral of Dr. Cato Mulligan on July 30, 2006, with  findings of multiple colon polyps.  One of them had high grade  dysplasia.  It was localized in the sigmoid colon at 15-cm.  The polyp  was removed entirely and the patient remains asymptomatic except for  some rectal pruritus.   We have discussed the diagnosis with her today.  There is a high risk  for colon cancer in the polyp itself and it ought to be rechecked within  6 months and possibly treated with a ruby coagulator her to ablate the  remaining tissue.  The patient agrees to proceed with repeat flexible  sigmoidoscopy in six months which would be January 28, 2007.  She will  receive a recall letter in June.  For now, she will use Calmoseptine  solution for rectal pruritus.     Hedwig Morton. Juanda Chance, MD  Electronically Signed    DMB/MedQ  DD: 10/15/2006  DT: 10/15/2006  Job #: [id number]   cc:   Valetta Mole. Swords, MD

## 2011-02-01 ENCOUNTER — Ambulatory Visit: Payer: Medicare Other | Admitting: Internal Medicine

## 2011-02-07 ENCOUNTER — Ambulatory Visit: Payer: Medicare Other | Admitting: Internal Medicine

## 2011-02-21 ENCOUNTER — Telehealth: Payer: Self-pay | Admitting: Internal Medicine

## 2011-02-21 NOTE — Telephone Encounter (Signed)
Pt had a hosp fup sch for 02/22/11 at 10am but had to cx. Pt had a family emergency. Pt has to get back in for hosp fup with Dr Cato Mulligan next week. Pls advise.

## 2011-02-22 ENCOUNTER — Ambulatory Visit: Payer: Medicare Other | Admitting: Internal Medicine

## 2011-02-22 NOTE — Telephone Encounter (Signed)
Let's look at schedule

## 2011-02-23 ENCOUNTER — Other Ambulatory Visit: Payer: Self-pay | Admitting: Internal Medicine

## 2011-02-23 DIAGNOSIS — E875 Hyperkalemia: Secondary | ICD-10-CM

## 2011-02-23 NOTE — Telephone Encounter (Signed)
See if pt can come on Tuesday the 14th at 7:45

## 2011-02-28 ENCOUNTER — Ambulatory Visit (INDEPENDENT_AMBULATORY_CARE_PROVIDER_SITE_OTHER): Payer: Medicare Other | Admitting: Internal Medicine

## 2011-02-28 ENCOUNTER — Encounter: Payer: Self-pay | Admitting: Internal Medicine

## 2011-02-28 DIAGNOSIS — J449 Chronic obstructive pulmonary disease, unspecified: Secondary | ICD-10-CM

## 2011-02-28 DIAGNOSIS — E039 Hypothyroidism, unspecified: Secondary | ICD-10-CM

## 2011-02-28 DIAGNOSIS — K562 Volvulus: Secondary | ICD-10-CM | POA: Insufficient documentation

## 2011-02-28 LAB — HEPATIC FUNCTION PANEL
Bilirubin, Direct: 0 mg/dL (ref 0.0–0.3)
Total Bilirubin: 0.2 mg/dL — ABNORMAL LOW (ref 0.3–1.2)

## 2011-02-28 LAB — CBC WITH DIFFERENTIAL/PLATELET
Basophils Relative: 0.5 % (ref 0.0–3.0)
Eosinophils Absolute: 0.1 10*3/uL (ref 0.0–0.7)
Hemoglobin: 15 g/dL (ref 12.0–15.0)
Lymphocytes Relative: 24.6 % (ref 12.0–46.0)
MCHC: 32.6 g/dL (ref 30.0–36.0)
MCV: 96.8 fl (ref 78.0–100.0)
Monocytes Absolute: 0.5 10*3/uL (ref 0.1–1.0)
Neutro Abs: 4.9 10*3/uL (ref 1.4–7.7)
RBC: 4.74 Mil/uL (ref 3.87–5.11)

## 2011-02-28 LAB — BASIC METABOLIC PANEL
Calcium: 9.9 mg/dL (ref 8.4–10.5)
Creatinine, Ser: 0.7 mg/dL (ref 0.4–1.2)

## 2011-02-28 NOTE — Progress Notes (Signed)
  Subjective:    Patient ID: Heidi [name], female    DOB: 26-Aug-1940, 70 y.o.   MRN: [id number]  HPI Right hemicolectomy: volvulus  COPD--not smoking  Thyroid---needs f/u  Past Medical History  Diagnosis Date  . COPD (chronic obstructive pulmonary disease)   . Hypothyroid   . Colon polyps   . Hyperlipidemia   . Pulmonary nodule    Past Surgical History  Procedure Date  . Hernia repair   . Cataract extraction   . Abdominal hysterectomy 1998    BSO  . Bladder surgery     tack  . Colon surgery 2012    emergent surgery [facility]    reports that she quit smoking about 15 months ago. She does not have any smokeless tobacco history on file. She reports that she does not drink alcohol. Her drug history not on file. family history includes COPD in her father and Cancer in her mother. No Known Allergies    Review of Systems  patient denies chest pain, shortness of breath, orthopnea. Denies lower extremity edema, abdominal pain, change in appetite, change in bowel movements. Patient denies rashes, musculoskeletal complaints. No other specific complaints in a complete review of systems.      Objective:   Physical Exam  Well-developed thin female in no acute distress. HEENT exam atraumatic, normocephalic, extraocular muscles are intact. Neck is supple. No jugular venous distention no thyromegaly. Chest clear to auscultation without increased work of breathing. Cardiac exam S1 and S2 are regular. Abdominal exam active bowel sounds, soft, nontender. Extremities no edema. Neurologic exam she is alert without any motor sensory deficits. Gait is normal.        Assessment & Plan:

## 2011-02-28 NOTE — Assessment & Plan Note (Signed)
She is doing well Not smoking

## 2011-02-28 NOTE — Assessment & Plan Note (Signed)
Needs follow up labs.

## 2011-02-28 NOTE — Assessment & Plan Note (Signed)
Reviewed labs Follow up labs today

## 2011-03-02 MED ORDER — LEVOTHYROXINE SODIUM 100 MCG PO TABS: 100.0000 ug | ORAL_TABLET | Freq: Every day | ORAL | Status: DC

## 2011-03-03 ENCOUNTER — Other Ambulatory Visit: Payer: Self-pay | Admitting: *Deleted

## 2011-03-03 ENCOUNTER — Ambulatory Visit: Payer: Medicare Other

## 2011-03-03 DIAGNOSIS — K635 Polyp of colon: Secondary | ICD-10-CM

## 2011-03-03 DIAGNOSIS — E875 Hyperkalemia: Secondary | ICD-10-CM

## 2011-03-03 LAB — BASIC METABOLIC PANEL
BUN: 14 mg/dL (ref 6–23)
CO2: 32 mEq/L (ref 19–32)
GFR: 116.31 mL/min (ref >60.00)
Glucose, Bld: 94 mg/dL (ref 70–99)
Potassium: 4.7 mEq/L (ref 3.5–5.1)

## 2011-03-03 MED ORDER — TIOTROPIUM BROMIDE MONOHYDRATE 18 MCG IN CAPS: 18.0000 ug | ORAL_CAPSULE | Freq: Every day | RESPIRATORY_TRACT | Status: DC

## 2011-03-06 ENCOUNTER — Encounter: Payer: Self-pay | Admitting: Internal Medicine

## 2011-04-18 LAB — BASIC METABOLIC PANEL
BUN: 3 mg/dL — ABNORMAL LOW (ref 6–23)
Calcium: 9.3 mg/dL (ref 8.4–10.5)
GFR calc non Af Amer: >60 mL/min (ref >60)
Glucose, Bld: 99 mg/dL (ref 70–99)

## 2011-04-18 LAB — BLOOD GAS, ARTERIAL
Bicarbonate: 27 mEq/L — ABNORMAL HIGH (ref 20.0–24.0)
TCO2: 23.6 mmol/L (ref 0–100)
pCO2 arterial: 42.1 mmHg (ref 35.0–45.0)
pH, Arterial: 7.423 — ABNORMAL HIGH (ref 7.350–7.400)

## 2011-04-18 LAB — B-NATRIURETIC PEPTIDE (CONVERTED LAB): Pro B Natriuretic peptide (BNP): 32.4 pg/mL (ref 0.0–100.0)

## 2011-04-18 LAB — DIFFERENTIAL
Basophils Absolute: 0 10*3/uL (ref 0.0–0.1)
Basophils Relative: 0 % (ref 0–1)
Eosinophils Relative: 0 % (ref 0–5)
Lymphocytes Relative: 9 % — ABNORMAL LOW (ref 12–46)
Neutro Abs: 8.3 10*3/uL — ABNORMAL HIGH (ref 1.7–7.7)

## 2011-04-18 LAB — URINALYSIS, ROUTINE W REFLEX MICROSCOPIC
Leukocytes, UA: NEGATIVE
Nitrite: NEGATIVE
Specific Gravity, Urine: 1.016 (ref 1.005–1.030)
pH: 5.5 (ref 5.0–8.0)

## 2011-04-18 LAB — CBC
Hemoglobin: 12.8 g/dL (ref 12.0–15.0)
MCHC: 33.6 g/dL (ref 30.0–36.0)
MCHC: 33.7 g/dL (ref 30.0–36.0)
Platelets: 256 10*3/uL (ref 150–400)
RBC: 4.06 MIL/uL (ref 3.87–5.11)
RDW: 14.7 % (ref 11.5–15.5)
WBC: 8.7 10*3/uL (ref 4.0–10.5)

## 2011-04-18 LAB — URINE MICROSCOPIC-ADD ON

## 2011-04-18 LAB — CULTURE, BLOOD (ROUTINE X 2)
Culture: NO GROWTH
Culture: NO GROWTH

## 2011-04-18 LAB — TSH
TSH: 0.366 u[IU]/mL (ref 0.350–4.500)
TSH: 0.456 u[IU]/mL (ref 0.350–4.500)

## 2011-05-08 ENCOUNTER — Ambulatory Visit (INDEPENDENT_AMBULATORY_CARE_PROVIDER_SITE_OTHER): Payer: Medicare Other | Admitting: Internal Medicine

## 2011-05-08 ENCOUNTER — Encounter: Payer: Self-pay | Admitting: Internal Medicine

## 2011-05-08 VITALS — BP 100/56 | HR 100 | Ht 64.0 in | Wt 85.0 lb

## 2011-05-08 DIAGNOSIS — K625 Hemorrhage of anus and rectum: Secondary | ICD-10-CM

## 2011-05-08 DIAGNOSIS — Z86010 Personal history of colonic polyps: Secondary | ICD-10-CM

## 2011-05-08 NOTE — Progress Notes (Signed)
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 We saw her in the past for a colorectal screening. She has a family history of colon cancer in her parent. Her last colonoscopies were in 1997 and in January 2008 when she was found to have multiple colon polyps; one of them with high-grade dysplasia localized 15 cm in the sigmoid colon. The right colon which was removed showed 2 polyps; one adenomatous and one hyperplastic. Patient denies any current symptoms. She has COPD.   Past Medical History  Diagnosis Date  . COPD (chronic obstructive pulmonary disease)   . Hypothyroidism   . 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 Hyperlipidemia   . Pulmonary nodule   . 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. Depression   . Cerebrovascular disease   . Positive ANA (antinuclear antibody)   . Diverticulosis   . Esophageal stricture   . Arthritis   . History of pneumonia   . History of stroke     mild oncoming   Past Surgical History  Procedure Date  . Hiatal hernia repair     x 2  . Cataract extraction     bilateral  . Total abdominal hysterectomy w/ bilateral salpingoophorectomy 1998  . Bladder surgery     tack  . Hemicolectomy 2012    emergent surgery [facility]  . Tonsillectomy     reports that she quit smoking about 18 months ago. She has never used smokeless tobacco. She reports that she does not drink alcohol or use illicit drugs. family history includes Breast cancer in her maternal aunt; COPD in her father; Colon cancer in her mother; Colon polyps in her brother; Heart attack in her father; Liver disease in her maternal aunt; and Stroke in her maternal aunt. Allergies  Allergen Reactions  . Penicillins         Review of Systems: Denies reflux symptoms, dysphagia  odynophagia or diarrhea  The remainder of the 10 point ROS is negative except as outlined in H&P   Physical Exam: General appearance  Well developed, in no distress. Eyes- non icteric. HEENT nontraumatic, normocephalic. Mouth no lesions, tongue papillated, no cheilosis. Neck supple without adenopathy, thyroid not enlarged, no carotid bruits, no JVD. Lungs Clear to auscultation bilaterally. Cor normal S1, normal S2, regular rhythm, no murmur,  quiet precordium. Abdomen: Soft with normoactive bowel sounds. No distention. No tenderness. Rectal: Tender anal canal. Moderate amount of solid Hemoccult-negative stool, no external hemorrhoids. Extremities no pedal edema. Skin no lesions. Neurological alert and oriented x 3. Psychological normal mood and affect.  Assessment and Plan:  Problem #1 status post right hemicolectomy for cecal volvulus. Patient is now doing well.  Problem #2 history of adenomatous polyp and polyp with high-grade dysplasia. She is due for a repeat colonoscopy. We will schedule this.  Problem #3 rectal bleeding, likely of anorectal origin. During colonoscopy which will be scheduled, we will make a decision as to whether or not to use banding ligation of the internal hemorrhoids.    05/08/2011 Heidi [name]

## 2011-05-08 NOTE — Patient Instructions (Signed)
We will contact you with an appointment for colonoscopy with possible hemorrhoidal banding at the hospital when a schedule for December 2012 becomes available.

## 2011-05-20 ENCOUNTER — Other Ambulatory Visit: Payer: Self-pay | Admitting: Internal Medicine

## 2011-05-22 ENCOUNTER — Other Ambulatory Visit: Payer: Self-pay | Admitting: *Deleted

## 2011-05-24 ENCOUNTER — Encounter: Payer: Self-pay | Admitting: Family Medicine

## 2011-05-24 ENCOUNTER — Ambulatory Visit (INDEPENDENT_AMBULATORY_CARE_PROVIDER_SITE_OTHER): Payer: Medicare Other | Admitting: Family Medicine

## 2011-05-24 VITALS — BP 120/80 | HR 104 | Temp 98.1°F | Wt 82.0 lb

## 2011-05-24 DIAGNOSIS — J40 Bronchitis, not specified as acute or chronic: Secondary | ICD-10-CM

## 2011-05-24 MED ORDER — METHYLPREDNISOLONE ACETATE 80 MG/ML IJ SUSP
120.0000 mg | Freq: Once | INTRAMUSCULAR | Status: AC
  Administered 2011-05-24: 120 mg via INTRAMUSCULAR

## 2011-05-24 MED ORDER — AZITHROMYCIN 250 MG PO TABS: ORAL_TABLET | ORAL | Status: AC

## 2011-05-24 NOTE — Progress Notes (Signed)
  Subjective:    Patient ID: Heidi [name], female    DOB: Apr 06, 1941, 70 y.o.   MRN: [id number]  HPI Here for 3 weeks of coughing up yellow sputum. This started with a ST and PND, but these have resolved. No fever or chest pain. Using Mucinex.    Review of Systems  Constitutional: Negative.   HENT: Negative.   Eyes: Negative.   Respiratory: Positive for cough.        Objective:   Physical Exam  Constitutional: She appears well-developed and well-nourished.  HENT:  Right Ear: External ear normal.  Left Ear: External ear normal.  Nose: Nose normal.  Mouth/Throat: Oropharynx is clear and moist. No oropharyngeal exudate.  Eyes: Conjunctivae are normal.  Neck: No thyromegaly present.  Pulmonary/Chest: Effort normal. She has no rales. She exhibits no tenderness.       Scattered rhonchi and wheezes  Lymphadenopathy:    She has no cervical adenopathy.          Assessment & Plan:  Recheck prn

## 2011-05-24 NOTE — Progress Notes (Signed)
Addended by: Aniceto Boss A on: 05/24/2011 12:58 PM   Modules accepted: Orders

## 2011-05-31 ENCOUNTER — Telehealth: Payer: Self-pay | Admitting: *Deleted

## 2011-05-31 MED ORDER — LEVOFLOXACIN 500 MG PO TABS: 500.0000 mg | ORAL_TABLET | Freq: Every day | ORAL | Status: AC

## 2011-05-31 NOTE — Telephone Encounter (Signed)
rx sent in to pharmacy.  Pt is aware.   

## 2011-05-31 NOTE — Telephone Encounter (Signed)
Pt seen Dr Clent Ridges last week for bronchitis and was given a shot and a zpack.  Pt is still coughing and wants a stronger antibiotic.

## 2011-05-31 NOTE — Telephone Encounter (Signed)
Call in Levaquin 500 mg q day for 10 days  

## 2011-06-20 ENCOUNTER — Other Ambulatory Visit: Payer: Self-pay | Admitting: Internal Medicine

## 2011-06-21 ENCOUNTER — Ambulatory Visit (AMBULATORY_SURGERY_CENTER): Payer: Medicare Other | Admitting: *Deleted

## 2011-06-21 VITALS — Ht 64.0 in | Wt 80.9 lb

## 2011-06-21 DIAGNOSIS — K625 Hemorrhage of anus and rectum: Secondary | ICD-10-CM

## 2011-06-21 MED ORDER — PEG-KCL-NACL-NASULF-NA ASC-C 100 G PO SOLR: ORAL | Status: DC

## 2011-06-22 ENCOUNTER — Encounter (HOSPITAL_COMMUNITY): Payer: Self-pay | Admitting: *Deleted

## 2011-06-22 ENCOUNTER — Ambulatory Visit (HOSPITAL_COMMUNITY)
Admission: RE | Admit: 2011-06-22 | Discharge: 2011-06-22 | Disposition: A | Discharge status: Home / Self Care | Payer: Medicare Other | Source: Ambulatory Visit | Attending: Internal Medicine | Admitting: Internal Medicine

## 2011-06-22 ENCOUNTER — Encounter (HOSPITAL_COMMUNITY)
Admission: RE | Disposition: A | Discharge status: Home / Self Care | Payer: Self-pay | Source: Ambulatory Visit | Attending: Internal Medicine

## 2011-06-22 DIAGNOSIS — Z86010 Personal history of colonic polyps: Secondary | ICD-10-CM

## 2011-06-22 DIAGNOSIS — D126 Benign neoplasm of colon, unspecified: Secondary | ICD-10-CM

## 2011-06-22 DIAGNOSIS — K648 Other hemorrhoids: Secondary | ICD-10-CM | POA: Insufficient documentation

## 2011-06-22 DIAGNOSIS — K621 Rectal polyp: Secondary | ICD-10-CM | POA: Insufficient documentation

## 2011-06-22 DIAGNOSIS — Z980 Intestinal bypass and anastomosis status: Secondary | ICD-10-CM | POA: Insufficient documentation

## 2011-06-22 DIAGNOSIS — K5730 Diverticulosis of large intestine without perforation or abscess without bleeding: Secondary | ICD-10-CM | POA: Insufficient documentation

## 2011-06-22 DIAGNOSIS — K633 Ulcer of intestine: Secondary | ICD-10-CM

## 2011-06-22 DIAGNOSIS — K625 Hemorrhage of anus and rectum: Secondary | ICD-10-CM

## 2011-06-22 DIAGNOSIS — K626 Ulcer of anus and rectum: Secondary | ICD-10-CM | POA: Insufficient documentation

## 2011-06-22 DIAGNOSIS — K620 Anal polyp: Secondary | ICD-10-CM | POA: Insufficient documentation

## 2011-06-22 DIAGNOSIS — K921 Melena: Secondary | ICD-10-CM | POA: Insufficient documentation

## 2011-06-22 HISTORY — PX: COLONOSCOPY: SHX5424

## 2011-06-22 HISTORY — PX: GASTRIC VARICES BANDING: SHX5519

## 2011-06-22 HISTORY — DX: Pneumonia, unspecified organism: J18.9

## 2011-06-22 HISTORY — DX: Shortness of breath: R06.02

## 2011-06-22 SURGERY — COLONOSCOPY
Anesthesia: Moderate Sedation

## 2011-06-22 MED ORDER — MIDAZOLAM HCL 10 MG/2ML IJ SOLN
INTRAMUSCULAR | Status: AC
  Filled 2011-06-22: qty 4

## 2011-06-22 MED ORDER — MIDAZOLAM HCL 5 MG/5ML IJ SOLN
INTRAMUSCULAR | Status: DC | PRN
  Administered 2011-06-22 (×3): 2 mg via INTRAVENOUS

## 2011-06-22 MED ORDER — FENTANYL CITRATE 0.05 MG/ML IJ SOLN
INTRAMUSCULAR | Status: AC
  Filled 2011-06-22: qty 4

## 2011-06-22 MED ORDER — SODIUM CHLORIDE 0.9 % IV SOLN
Freq: Once | INTRAVENOUS | Status: AC
  Administered 2011-06-22: 500 mL via INTRAVENOUS

## 2011-06-22 MED ORDER — FENTANYL NICU IV SYRINGE 50 MCG/ML
INJECTION | INTRAMUSCULAR | Status: DC | PRN
  Administered 2011-06-22 (×3): 25 ug via INTRAVENOUS

## 2011-06-22 NOTE — Interval H&P Note (Signed)
History and Physical Interval Note:  06/22/2011 3:34 PM  Heidi [name]  has presented today for surgery, with the diagnosis of internal hemorrhoids  The various methods of treatment have been discussed with the patient and family. After consideration of risks, benefits and other options for treatment, the patient has consented to  Procedure(s): COLONOSCOPY HEMORRHOID BANDING as a surgical intervention .  The patients' history has been reviewed, patient examined, no change in status, stable for surgery.  I have reviewed the patients' chart and labs.  Questions were answered to the patient's satisfaction.     [name]  Reviewed my OV note from 05/08/2011

## 2011-06-22 NOTE — H&P (View-Only) (Signed)
  Subjective:    Patient ID: Heidi [name], female    DOB: 11/07/1940, 70 y.o.   MRN: [id number]  HPI Here for 3 weeks of coughing up yellow sputum. This started with a ST and PND, but these have resolved. No fever or chest pain. Using Mucinex.    Review of Systems  Constitutional: Negative.   HENT: Negative.   Eyes: Negative.   Respiratory: Positive for cough.        Objective:   Physical Exam  Constitutional: She appears well-developed and well-nourished.  HENT:  Right Ear: External ear normal.  Left Ear: External ear normal.  Nose: Nose normal.  Mouth/Throat: Oropharynx is clear and moist. No oropharyngeal exudate.  Eyes: Conjunctivae are normal.  Neck: No thyromegaly present.  Pulmonary/Chest: Effort normal. She has no rales. She exhibits no tenderness.       Scattered rhonchi and wheezes  Lymphadenopathy:    She has no cervical adenopathy.          Assessment & Plan:  Recheck prn  

## 2011-06-22 NOTE — Op Note (Signed)
[facility] [address] [place], [place]  [address]  COLONOSCOPY PROCEDURE REPORT  PATIENT:  Heidi, [name]  MR#:  [id number] BIRTHDATE:  [birth year], 70 yrs. old  GENDER:  female ENDOSCOPIST:  Hedwig Morton. Juanda Chance, MD REF. BY:  Birdie Sons, M.D. PROCEDURE DATE:  06/22/2011 PROCEDURE:  Colon w/ banding hemorrhoid, Colonoscopy with biopsy and snare polypectomy ASA CLASS:  Class II INDICATIONS:  hematochezia S/P EMERGENCY RIGHT HEMICOLECTOMY FOR CECAL VOLVULUS 12/2010,hx of adenomatous polyps, symptomatic hemorrhoids MEDICATIONS:   These medications were titrated to patient response per physician's verbal order, Versed 6 mg, Fentanyl 75 mcg  DESCRIPTION OF PROCEDURE:   After the risks and benefits and of the procedure were explained, informed consent was obtained. Digital rectal exam was performed and revealed no rectal masses. The Pentax Colonoscope [id number] endoscope was introduced through the anus and advanced to the anastomosis.  The quality of the prep was good, using MoviPrep.  The instrument was then slowly withdrawn as the colon was fully examined. <<PROCEDUREIMAGES>>  FINDINGS:  The right colon was surgically resected and an ileo-colonic anastamosis was seen. With standard forceps, biopsy was obtained and sent to pathology (see image4, image3, image5, image6, and image7). granulation tissue vs polyp in the ileocolic anastomosis  Mild diverticulosis was found in the sigmoid colon (see image2 and image1).  There were multiple polyps identified and removed. in the rectum and sigmoid colon. at 5-25 cm multiple sessile polyps 3-10 mm The polyps were removed using cold biopsy forceps. Polyps were snared without cautery. Retrieval was successful (see image8, image9, image10, image12, image13, image14, and image15). snare polyp  An ulcer was found in the rectum (see image16).  Internal Hemorrhoids were found. banding Hemorrhoidal banding was performed (see image11). 6 bands  applied Retroflexed views in the rectum revealed no abnormalities.    The scope was then withdrawn from the patient and the procedure completed.  COMPLICATIONS:  None ENDOSCOPIC IMPRESSION: 1) Prior right hemi-colectomy 2) Mild diverticulosis in the sigmoid colon 3) Polyps, multiple in the rectum and sigmoid colon 4) Ulcer in the rectum 5) Internal hemorrhoids s/p banding of the internal hemorrhoids x 2 RECOMMENDATIONS: 1) Await pathology results Anusol HC supp hs  REPEAT EXAM:  In 5 year(s) for.  ______________________________ Hedwig Morton. Juanda Chance, MD  CC:  n. eSIGNED:   Hedwig Morton. Keante Urizar at 06/22/2011 04:56 PM  [name], [id number]

## 2011-06-23 ENCOUNTER — Other Ambulatory Visit: Payer: Self-pay | Admitting: Internal Medicine

## 2011-06-23 ENCOUNTER — Encounter (HOSPITAL_COMMUNITY): Payer: Self-pay | Admitting: Internal Medicine

## 2011-06-26 ENCOUNTER — Encounter: Payer: Self-pay | Admitting: Internal Medicine

## 2011-07-14 ENCOUNTER — Encounter: Payer: Self-pay | Admitting: *Deleted

## 2011-07-21 ENCOUNTER — Ambulatory Visit (INDEPENDENT_AMBULATORY_CARE_PROVIDER_SITE_OTHER): Payer: Medicare Other | Admitting: Internal Medicine

## 2011-07-21 ENCOUNTER — Other Ambulatory Visit: Payer: Self-pay | Admitting: Internal Medicine

## 2011-07-21 ENCOUNTER — Encounter: Payer: Self-pay | Admitting: Internal Medicine

## 2011-07-21 VITALS — BP 138/90 | HR 76 | Ht 64.0 in | Wt 81.8 lb

## 2011-07-21 DIAGNOSIS — R634 Abnormal weight loss: Secondary | ICD-10-CM | POA: Diagnosis not present

## 2011-07-21 MED ORDER — LORAZEPAM 0.5 MG PO TABS: 0.5000 mg | ORAL_TABLET | Freq: Every day | ORAL | Status: AC | PRN

## 2011-07-21 NOTE — Patient Instructions (Addendum)
We have sent the following medications to your pharmacy for you to pick up at your convenience: Ativan Follow up 6 months CC: Dr Birdie Sons

## 2011-07-21 NOTE — Progress Notes (Signed)
Heidi [name] 08-17-1940 MRN [id number]    History of Present Illness:  This is a 71 year old white female who is status post emergency right hemicolectomy for a cecal volvulus in June 2012. She is status post colonoscopy in December 2012 with findings of multiple colon polyps, rectal ulcer and hemorrhoidal banding ligation x2. The polyps were a tubular adenoma and hyperplastic polyp. She has no complaints today except for being stressed out. She denies rectal bleeding, diarrhea or abdominal pain. Her appetite has been excellent but she has not gained any weight. In fact, she has lost from 85 pounds down to 81 pounds today. Her sister-in-law who comes with her reports patient eating well and having a good appetite except when she is stressed out. She has a son and his girlfriend with her daughter who lives in the house with her and continues to smoke. They don't pay any rent or utilities and it causes a lot of stress to the patient.   Past Medical History  Diagnosis Date  . COPD (chronic obstructive pulmonary disease)   . Hypothyroidism   . 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 Hyperlipidemia   . Pulmonary nodule   . 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. Depression   . Cerebrovascular disease   . Positive ANA (antinuclear antibody)   . Diverticulosis   . Esophageal stricture   . Arthritis   . History of pneumonia   . History of stroke     mild oncoming  . Pneumonia     HX of  . Shortness of breath     periodically with COPD flair up  . Internal hemorrhoids   . Rectal ulcer    Past Surgical History  Procedure Date  . Hiatal hernia repair     x 2  . Cataract extraction     bilateral  . Total abdominal hysterectomy w/ bilateral salpingoophorectomy 1998  . Bladder surgery     tack  . Hemicolectomy 2012    emergent surgery [facility]  . Tonsillectomy   . Colonoscopy 06/22/2011    Procedure: COLONOSCOPY;  Surgeon: Hart Carwin, MD;  Location: [facility] END[facility];   Service: Endoscopy;  Laterality: N/A;  . Gastric varices banding 06/22/2011    Procedure: HEMORRHOID BANDING;  Surgeon: Hart Carwin, MD;  Location: [facility] ENDOSCOPY;  Service: Endoscopy;  Laterality: N/A;    reports that she quit smoking about 20 months ago. She has never used smokeless tobacco. She reports that she does not drink alcohol or use illicit drugs. family history includes Breast cancer in her maternal aunt; COPD in her father; Colon cancer in her mother; Colon polyps in her brother; Heart attack in her father; Liver disease in her maternal aunt; and Stroke in her maternal aunt. Allergies  Allergen Reactions  . Penicillins Hives        Review of Systems denies dysphagia, odynophagia, chest pain or shortness of breath:  The remainder of the 10 point ROS is negative except as outlined in H&P   Physical Exam: General appearance thin, in no distress. Eyes- non icteric. HEENT nontraumatic, normocephalic. Mouth no lesions, tongue papillated, no cheilosis. Neck supple without adenopathy, thyroid not enlarged, no carotid bruits, no JVD. Lungs Clear to auscultation bilaterally. Cor normal S1, normal S2, regular rhythm, no murmur,  quiet precordium. Abdomen: Soft scaphoid abdomen but well-healed surgical scar. Normoactive bowel sounds. No mass or tenderness. Rectal: Not done Extremities no pedal edema. Skin no lesions. Neurological alert  and oriented x 3. Psychological normal mood and affect.  Assessment and Plan:  Problem #1 Right hemicolectomy for a cecal volvulus. There is a history of multiple colon polyps. Patient is doing well GI wise. She is eating well but has not gained any weight. Her usual weight is 90 pounds. I will add Ativan 0.5 mg to take when necessary for stress. This may improve her appetite. I will see her in 6 months Problem#2- adenomatous colon polyps, recall colon 3 years.   07/21/2011 Heidi [name]

## 2011-10-13 ENCOUNTER — Other Ambulatory Visit: Payer: Self-pay | Admitting: Internal Medicine

## 2011-10-18 ENCOUNTER — Telehealth: Payer: Self-pay | Admitting: Internal Medicine

## 2011-10-18 NOTE — Telephone Encounter (Signed)
[facility] called and said that they have not gotten a call or refill sent in for pts Temazepam. Pharmacist said that its needs to be a signed script if faxed in or called in directly to pharmacist.

## 2011-10-18 NOTE — Telephone Encounter (Signed)
rx faxed to pharmacy

## 2011-10-18 NOTE — Telephone Encounter (Signed)
Patient called stating that she need a refill on her tamazepam called into [facility] in [place]. Please assist.

## 2011-10-18 NOTE — Telephone Encounter (Signed)
rx called in

## 2011-10-19 ENCOUNTER — Ambulatory Visit (INDEPENDENT_AMBULATORY_CARE_PROVIDER_SITE_OTHER): Payer: Medicare Other | Admitting: Internal Medicine

## 2011-10-19 VITALS — BP 140/82 | Temp 97.6°F | Wt 87.0 lb

## 2011-10-19 DIAGNOSIS — E039 Hypothyroidism, unspecified: Secondary | ICD-10-CM

## 2011-10-19 DIAGNOSIS — J449 Chronic obstructive pulmonary disease, unspecified: Secondary | ICD-10-CM

## 2011-10-19 DIAGNOSIS — E785 Hyperlipidemia, unspecified: Secondary | ICD-10-CM

## 2011-10-19 LAB — LIPID PANEL
HDL: 90.1 mg/dL (ref >39.00)
Triglycerides: 104 mg/dL (ref 0.0–149.0)
VLDL: 20.8 mg/dL (ref 0.0–40.0)

## 2011-10-19 LAB — BASIC METABOLIC PANEL
CO2: 31 mEq/L (ref 19–32)
Calcium: 9.9 mg/dL (ref 8.4–10.5)
Creatinine, Ser: 0.6 mg/dL (ref 0.4–1.2)
GFR: 107.07 mL/min (ref >60.00)
Sodium: 135 mEq/L (ref 135–145)

## 2011-10-19 LAB — TSH: TSH: 93.55 u[IU]/mL — ABNORMAL HIGH (ref 0.35–5.50)

## 2011-10-19 LAB — HEPATIC FUNCTION PANEL
Albumin: 4.9 g/dL (ref 3.5–5.2)
Alkaline Phosphatase: 46 U/L (ref 39–117)
Bilirubin, Direct: 0.1 mg/dL (ref 0.0–0.3)
Total Protein: 7.9 g/dL (ref 6.0–8.3)

## 2011-10-19 LAB — LDL CHOLESTEROL, DIRECT: Direct LDL: 201.2 mg/dL

## 2011-10-19 MED ORDER — TEMAZEPAM 15 MG PO CAPS: 15.0000 mg | ORAL_CAPSULE | Freq: Every evening | ORAL | Status: DC | PRN

## 2011-10-19 NOTE — Progress Notes (Signed)
Patient ID: Heidi [name], female   DOB: 04-04-1941, 71 y.o.   MRN: [id number] Patient Active Problem List  Diagnoses  . HYPOTHYROIDISM--needs follow up  . HYPERLIPIDEMIA- tolerating meds, f/u labs  .   Marland Kitchen COPD- has not had any recent exacerbation  . PSORIASIS---no recent flare up   Past Medical History  Diagnosis Date  . COPD (chronic obstructive pulmonary disease)   . Hypothyroidism   . 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 Hyperlipidemia   . Pulmonary nodule   . 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. Depression   . Cerebrovascular disease   . Positive ANA (antinuclear antibody)   . Diverticulosis   . Esophageal stricture   . Arthritis   . History of pneumonia   . History of stroke     mild oncoming  . Pneumonia     HX of  . Shortness of breath     periodically with COPD flair up  . Internal hemorrhoids   . Rectal ulcer     History   Social History  . Marital Status: Widowed    Spouse Name: N/A    Number of Children: 3  . Years of Education: N/A   Occupational History  . RETIRED    Social History Main Topics  . Smoking status: Former Smoker    Quit date: 11/01/2009  . Smokeless tobacco: Never Used  . Alcohol Use: No  . Drug Use: No  . Sexually Active: Not Currently   Other Topics Concern  . Not on file   Social History Narrative  . No narrative on file    Past Surgical History  Procedure Date  . Hiatal hernia repair     x 2  . Cataract extraction     bilateral  . Total abdominal hysterectomy w/ bilateral salpingoophorectomy 1998  . Bladder surgery     tack  . Hemicolectomy 2012    emergent surgery [facility]  . Tonsillectomy   . Colonoscopy 06/22/2011    Procedure: COLONOSCOPY;  Surgeon: Hart Carwin, MD;  Location: [facility];  Service: Endoscopy;  Laterality: N/A;  . Gastric varices banding 06/22/2011    Procedure: HEMORRHOID BANDING;  Surgeon: Hart Carwin, MD;  Location: [facility];  Service: Endoscopy;  Laterality:  N/A;    Family History  Problem Relation Age of Onset  . Colon cancer Mother   . COPD Father   . Heart attack Father   . Stroke Maternal Aunt   . Colon polyps Brother   . Liver disease Maternal Aunt   . Breast cancer Maternal Aunt     Allergies  Allergen Reactions  . Penicillins Hives    Current Outpatient Prescriptions on File Prior to Visit  Medication Sig Dispense Refill  . halobetasol (ULTRAVATE) 0.05 % ointment Apply topically 2 (two) times daily.        Marland Kitchen levothyroxine (SYNTHROID) 100 MCG tablet Take 1 tablet (100 mcg total) by mouth daily.  90 tablet  3  . LORazepam (ATIVAN) 0.5 MG tablet Take 1 tablet (0.5 mg total) by mouth daily as needed (stress).  30 tablet  2  . Multiple Vitamin (MULTIVITAMIN) tablet Take 1 tablet by mouth daily.        . simvastatin (ZOCOR) 20 MG tablet Take 20 mg by mouth at bedtime.        . temazepam (RESTORIL) 15 MG capsule TAKE ONE CAPSULE BY MOUTH AT BEDTIME ,NO  MORE  THEN  4  PER  WEEK  15 capsule  2  . tiotropium (SPIRIVA) 18 MCG inhalation capsule Place 1 capsule (18 mcg total) into inhaler and inhale daily.  30 capsule  5     patient denies chest pain, shortness of breath, orthopnea. Denies lower extremity edema, abdominal pain, change in appetite, change in bowel movements. Patient denies rashes, musculoskeletal complaints. No other specific complaints in a complete review of systems.   BP 140/82  Temp(Src) 97.6 F (36.4 C) (Oral)  Wt 87 lb (39.463 kg) Thin female in no acute distress. HEENT exam atraumatic, normocephalic, extraocular muscles are intact. Neck is supple. No jugular venous distention no thyromegaly. Chest clear to auscultation without increased work of breathing. Cardiac exam S1 and S2 are regular. Abdominal exam active bowel sounds, soft, nontender.

## 2011-10-22 NOTE — Assessment & Plan Note (Signed)
She has had trouble with stbility of tsh in the past [place] check labs

## 2011-10-22 NOTE — Assessment & Plan Note (Signed)
Will check labs

## 2011-10-24 ENCOUNTER — Other Ambulatory Visit: Payer: Self-pay | Admitting: *Deleted

## 2011-10-24 DIAGNOSIS — E785 Hyperlipidemia, unspecified: Secondary | ICD-10-CM

## 2011-10-24 MED ORDER — ATORVASTATIN CALCIUM 40 MG PO TABS: 40.0000 mg | ORAL_TABLET | Freq: Every day | ORAL | Status: DC

## 2012-03-26 ENCOUNTER — Other Ambulatory Visit: Payer: Self-pay | Admitting: Internal Medicine

## 2012-03-27 ENCOUNTER — Telehealth: Payer: Self-pay | Admitting: Internal Medicine

## 2012-03-27 NOTE — Telephone Encounter (Signed)
Pt called and said that [name] in [place], told pt that she can only get 15 pills at a time for the temazepam (RESTORIL) 15 MG capsule, even though Dr Cato Mulligan wrote script for #30. Pt said that the [name] told her that they can only do 15 at a time because of pts insurance. Pls call pt.

## 2012-03-28 MED ORDER — TEMAZEPAM 15 MG PO CAPS: 15.0000 mg | ORAL_CAPSULE | Freq: Every day | ORAL | Status: DC | PRN

## 2012-03-28 NOTE — Telephone Encounter (Signed)
Ok per Dr Swords, rx called in 

## 2012-04-18 DIAGNOSIS — Z23 Encounter for immunization: Secondary | ICD-10-CM | POA: Diagnosis not present

## 2012-04-19 ENCOUNTER — Encounter: Payer: Self-pay | Admitting: Internal Medicine

## 2012-07-11 ENCOUNTER — Other Ambulatory Visit: Payer: Self-pay | Admitting: *Deleted

## 2012-07-11 MED ORDER — TEMAZEPAM 15 MG PO CAPS: 15.0000 mg | ORAL_CAPSULE | Freq: Every day | ORAL | Status: DC | PRN

## 2012-07-24 ENCOUNTER — Other Ambulatory Visit: Payer: Self-pay | Admitting: Internal Medicine

## 2012-08-08 ENCOUNTER — Other Ambulatory Visit: Payer: Self-pay | Admitting: Internal Medicine

## 2012-08-08 NOTE — Telephone Encounter (Signed)
Pt request refill of temazepam (RESTORIL) 15 MG capsule.  Pt would like to request 30 pills instead of 15.  Pt states MD ordered 30 last time, but the pharm only filled 15. Pharm:  [facility] [place]

## 2012-08-08 NOTE — Telephone Encounter (Signed)
Is it ok to send in #30?

## 2012-08-09 MED ORDER — TEMAZEPAM 15 MG PO CAPS: 15.0000 mg | ORAL_CAPSULE | Freq: Every day | ORAL | Status: DC | PRN

## 2012-08-09 NOTE — Telephone Encounter (Signed)
rx called in, pt aware 

## 2012-08-09 NOTE — Telephone Encounter (Signed)
Ok to send in #30

## 2012-10-09 ENCOUNTER — Other Ambulatory Visit: Payer: Self-pay | Admitting: Internal Medicine

## 2012-10-10 ENCOUNTER — Telehealth: Payer: Self-pay | Admitting: Internal Medicine

## 2012-10-10 NOTE — Telephone Encounter (Signed)
Needs OV.  

## 2012-10-10 NOTE — Telephone Encounter (Signed)
Pt called and stated that she needed a refill of her temazepam (RESTORIL) 15 MG capsule, 1poqd at bedtime. Please assist.

## 2012-10-11 NOTE — Telephone Encounter (Signed)
Pt is sch for 3-31 845am

## 2012-10-14 ENCOUNTER — Encounter: Payer: Self-pay | Admitting: Internal Medicine

## 2012-10-14 ENCOUNTER — Ambulatory Visit (INDEPENDENT_AMBULATORY_CARE_PROVIDER_SITE_OTHER): Payer: Medicare Other | Admitting: Internal Medicine

## 2012-10-14 VITALS — BP 148/90 | HR 76 | Temp 97.8°F | Wt 94.0 lb

## 2012-10-14 DIAGNOSIS — E785 Hyperlipidemia, unspecified: Secondary | ICD-10-CM

## 2012-10-14 DIAGNOSIS — L408 Other psoriasis: Secondary | ICD-10-CM

## 2012-10-14 DIAGNOSIS — E039 Hypothyroidism, unspecified: Secondary | ICD-10-CM

## 2012-10-14 LAB — CBC WITH DIFFERENTIAL/PLATELET
Basophils Relative: 0.5 % (ref 0.0–3.0)
Eosinophils Absolute: 0 10*3/uL (ref 0.0–0.7)
Eosinophils Relative: 0.1 % (ref 0.0–5.0)
HCT: 35.6 % — ABNORMAL LOW (ref 36.0–46.0)
Lymphs Abs: 1.2 10*3/uL (ref 0.7–4.0)
MCHC: 33.9 g/dL (ref 30.0–36.0)
MCV: 98.1 fl (ref 78.0–100.0)
Monocytes Absolute: 0.3 10*3/uL (ref 0.1–1.0)
Neutro Abs: 3.4 10*3/uL (ref 1.4–7.7)
RBC: 3.63 Mil/uL — ABNORMAL LOW (ref 3.87–5.11)
WBC: 4.9 10*3/uL (ref 4.5–10.5)

## 2012-10-14 LAB — HEPATIC FUNCTION PANEL
ALT: 36 U/L — ABNORMAL HIGH (ref 0–35)
Albumin: 5.1 g/dL (ref 3.5–5.2)
Alkaline Phosphatase: 47 U/L (ref 39–117)
Bilirubin, Direct: 0.1 mg/dL (ref 0.0–0.3)
Total Protein: 7.6 g/dL (ref 6.0–8.3)

## 2012-10-14 NOTE — Progress Notes (Signed)
Patient ID: Heidi [name], female   DOB: 04-30-1941, 72 y.o.   MRN: [id number] Right hip and flank pain- ongoing for 8 months after a fall. Fell down some steps and fell on right side. Increase pain with breathing.   Thyroid- she is absolutely noncompliant with meds and follow up  Reviewed past medical history. Reviewed medications.  She admits to fatigue, dry skin. No shortness of breath. No lower extremity edema. No other complaints in a complete review of systems.  Exam: Reviewed vital signs. HEENT exam atraumatic, normocephalic. She does appear somewhat myxedematous. Neck is supple. Chest clear to auscultation cardiac exam S1-S2 are regular. Abdominal exam active bowel sounds, soft. She does have a midline hernia below the umbilicus. Neck is supple. Extremities no edema. Neurologic exam she is alert and oriented. She is a normal gait. Deep tendon reflexes are intact.

## 2012-10-15 NOTE — Assessment & Plan Note (Signed)
Will check lab work. I suspect part of this is due to hypothyroidism.

## 2012-10-15 NOTE — Assessment & Plan Note (Signed)
Uses Ultravate when necessary.

## 2012-10-15 NOTE — Assessment & Plan Note (Signed)
I suspect she has marked hypothyroidism. She has been noncompliant with medications. This was discussed with her in detail.

## 2012-10-16 ENCOUNTER — Other Ambulatory Visit: Payer: Self-pay | Admitting: *Deleted

## 2012-10-16 MED ORDER — LEVOTHYROXINE SODIUM 100 MCG PO TABS: 100.0000 ug | ORAL_TABLET | Freq: Every day | ORAL | Status: DC

## 2012-11-20 ENCOUNTER — Encounter: Payer: Self-pay | Admitting: Family

## 2012-11-20 ENCOUNTER — Other Ambulatory Visit: Payer: Self-pay | Admitting: Family

## 2012-11-20 ENCOUNTER — Ambulatory Visit (INDEPENDENT_AMBULATORY_CARE_PROVIDER_SITE_OTHER): Payer: Medicare Other | Admitting: Family

## 2012-11-20 VITALS — BP 160/90 | HR 81 | Wt 84.0 lb

## 2012-11-20 DIAGNOSIS — R634 Abnormal weight loss: Secondary | ICD-10-CM

## 2012-11-20 DIAGNOSIS — J441 Chronic obstructive pulmonary disease with (acute) exacerbation: Secondary | ICD-10-CM

## 2012-11-20 DIAGNOSIS — E039 Hypothyroidism, unspecified: Secondary | ICD-10-CM | POA: Diagnosis not present

## 2012-11-20 DIAGNOSIS — E7800 Pure hypercholesterolemia, unspecified: Secondary | ICD-10-CM

## 2012-11-20 LAB — LIPID PANEL
HDL: 53.6 mg/dL (ref >39.00)
LDL Cholesterol: 112 mg/dL — ABNORMAL HIGH (ref 0–99)
Total CHOL/HDL Ratio: 3
VLDL: 12.6 mg/dL (ref 0.0–40.0)

## 2012-11-20 MED ORDER — FUROSEMIDE 20 MG PO TABS: 20.0000 mg | ORAL_TABLET | Freq: Every day | ORAL | Status: DC

## 2012-11-20 MED ORDER — TIOTROPIUM BROMIDE MONOHYDRATE 18 MCG IN CAPS: 18.0000 ug | ORAL_CAPSULE | Freq: Every day | RESPIRATORY_TRACT | Status: DC

## 2012-11-20 MED ORDER — TEMAZEPAM 15 MG PO CAPS: 15.0000 mg | ORAL_CAPSULE | Freq: Every day | ORAL | Status: DC | PRN

## 2012-11-20 NOTE — Patient Instructions (Addendum)

## 2012-11-20 NOTE — Progress Notes (Signed)
  Subjective:    Patient ID: Heidi [name], female    DOB: 29-Nov-1940, 72 y.o.   MRN: [id number]  HPI 72 year old white female, nonsmoker, patient of Dr. Cato Mulligan is in for recheck of noncompliant hypothyroidism. Patient is prescribed 100 mics of Synthroid once daily. She had been off the medication completely when she saw Dr. Cato Mulligan in her last office visit. In turn, her TSH was 1:15. Patient reports that she's been taken Synthroid 100 mics since that time. She continues to complain of peripheral edema that's been ongoing. She's also had weight loss. Reports her appetite been normal but often does not consciously think he. She's also caring for her 71 year old woman. Reports also having stress with her son and his wife moved in to her home with her 42 year old daughter. Her cholesterol was found to be elevated at her last office visit. We'll recheck it today.   Review of Systems  Constitutional: Positive for unexpected weight change.       Down 10 pounds  HENT: Negative.   Respiratory: Negative.   Cardiovascular: Negative.   Gastrointestinal: Negative.   Endocrine: Negative.  Negative for cold intolerance and heat intolerance.  Musculoskeletal: Negative.   Skin: Negative.   Neurological: Negative.   Psychiatric/Behavioral: Negative.        Objective:   Physical Exam  Constitutional: She is oriented to person, place, and time. She appears well-developed and well-nourished.  HENT:  Right Ear: External ear normal.  Left Ear: External ear normal.  Nose: Nose normal.  Mouth/Throat: Oropharynx is clear and moist.  Neck: Normal range of motion. Neck supple.  Cardiovascular: Normal rate, regular rhythm and normal heart sounds.   Pulmonary/Chest: Effort normal and breath sounds normal.  Abdominal: Soft. Bowel sounds are normal.  Musculoskeletal: Normal range of motion. She exhibits no edema.  1+ edema   Neurological: She is alert and oriented to person, place, and time.  Skin: Skin is warm  and dry.  Psychiatric: She has a normal mood and affect.          Assessment & Plan:  Assessment: 1. Hypothyroidism 2. Weight loss 3. Peripheral edema 4. Hypercholesterolemia  Plan: TSH and lipids recent today. Will notify patient pending results and discuss further treatment plan at that time. I have advised her to start drinking boost or insure daily to help supplement and help improve her weight. Will do a recheck in a couple weeks to be sure that her weight is not continuing to decrease.

## 2013-01-02 DIAGNOSIS — S129XXA Fracture of neck, unspecified, initial encounter: Secondary | ICD-10-CM | POA: Diagnosis not present

## 2013-01-08 DIAGNOSIS — S32009A Unspecified fracture of unspecified lumbar vertebra, initial encounter for closed fracture: Secondary | ICD-10-CM | POA: Diagnosis not present

## 2013-01-08 DIAGNOSIS — M545 Low back pain: Secondary | ICD-10-CM | POA: Diagnosis not present

## 2013-01-15 ENCOUNTER — Encounter: Payer: Self-pay | Admitting: Internal Medicine

## 2013-01-15 ENCOUNTER — Ambulatory Visit (INDEPENDENT_AMBULATORY_CARE_PROVIDER_SITE_OTHER): Payer: Medicare Other | Admitting: Internal Medicine

## 2013-01-15 VITALS — BP 130/80 | Temp 97.5°F | Wt 80.0 lb

## 2013-01-15 DIAGNOSIS — J449 Chronic obstructive pulmonary disease, unspecified: Secondary | ICD-10-CM | POA: Diagnosis not present

## 2013-01-15 MED ORDER — HYDROCODONE-ACETAMINOPHEN 10-325 MG PO TABS: 1.0000 | ORAL_TABLET | Freq: Three times a day (TID) | ORAL | Status: DC | PRN

## 2013-01-15 MED ORDER — POLYETHYLENE GLYCOL 3350 17 GM/SCOOP PO POWD: 17.0000 g | Freq: Two times a day (BID) | ORAL | Status: DC | PRN

## 2013-01-15 MED ORDER — MELOXICAM 7.5 MG PO TABS: 7.5000 mg | ORAL_TABLET | Freq: Every day | ORAL | Status: DC

## 2013-01-15 MED ORDER — CYCLOBENZAPRINE HCL 5 MG PO TABS: 5.0000 mg | ORAL_TABLET | Freq: Three times a day (TID) | ORAL | Status: DC | PRN

## 2013-01-15 NOTE — Patient Instructions (Addendum)
Most patients with low back pain will improve with time over the next two to 6 weeks.  Keep active but avoid any activities that cause pain.  Apply moist heat to the low back area several times daily.  Take medications as directed  Orthopedic followupConstipation, Adult Constipation is when a person:  Poops (bowel movement) less than 3 times a week.  Has a hard time pooping.  Has poop that is dry, hard, or bigger than normal. HOME CARE   Eat more fiber, such as fruits, vegetables, whole grains like brown rice, and beans.  Eat less fatty foods and sugar. This includes [place] fries, hamburgers, cookies, candy, and soda.  If you are not getting enough fiber from food, take products with added fiber in them (supplements).  Drink enough fluid to keep your pee (urine) clear or pale yellow.  Go to the restroom when you feel like you need to poop. Do not hold it.  Only take medicine as told by your doctor. Do not take medicines that help you poop (laxatives) without talking to your doctor first.  Exercise on a regular basis, or as told by your doctor. GET HELP RIGHT AWAY IF:   You have bright red blood in your poop (stool).  Your constipation lasts more than 4 days or gets worse.  You have belly (abdomen) or butt (rectal) pain.  You have thin poop (as thin as a pencil).  You lose weight, and it cannot be explained. MAKE SURE YOU:   Understand these instructions.  Will watch your condition.  Will get help right away if you are not doing well or get worse. Document Released: 12/20/2007 Document Revised: 09/25/2011 Document Reviewed: 06/06/2011 [facility] Patient Information 2014 [place], [place]. Back, Compression Fracture A compression fracture happens when a force is put upon the length of your spine. Slipping and falling on your bottom are examples of such a force. When this happens, sometimes the force is great enough to compress the building blocks (vertebral bodies) of your  spine. Although this causes a lot of pain, this can usually be treated at home, unless your caregiver feels hospitalization is needed for pain control. Your backbone (spinal column) is made up of 24 main vertebral bodies in addition to the sacrum and coccyx (see illustration). These are held together by tough fibrous tissues (ligaments) and by support of your muscles. Nerve roots pass through the openings between the vertebrae. A sudden wrenching move, injury, or a fall may cause a compression fracture of one of the vertebral bodies. This may result in back pain or spread of pain into the belly (abdomen), the buttocks, and down the leg into the foot. Pain may also be created by muscle spasm alone. Large studies have been undertaken to determine the best possible course of action to help your back following injury and also to prevent future problems. The recommendations are as follows. FOLLOWING A COMPRESSION FRACTURE: Do the following only if advised by your caregiver.   If a back brace has been suggested or provided, wear it as directed.  DO NOT stop wearing the back brace unless instructed by your caregiver.  When allowed to return to regular activities, avoid a sedentary life style. Actively exercise. Sporadic weekend binges of tennis, racquetball, water skiing, may actually aggravate or create problems, especially if you are not in condition for that activity.  Avoid sports requiring sudden body movements until you are in condition for them. Swimming and walking are safer activities.  Maintain good posture.  Avoid obesity.  If not already done, you should have a DEXA scan. Based on the results, be treated for osteoporosis. FOLLOWING ACUTE (SUDDEN) INJURY:  Only take over-the-counter or prescription medicines for pain, discomfort, or fever as directed by your caregiver.  Use bed rest for only the most extreme acute episode. Prolonged bed rest may aggravate your condition. Ice used for acute  conditions is effective. Use a large plastic bag filled with ice. Wrap it in a towel. This also provides excellent pain relief. This may be continuous. Or use it for 30 minutes every 2 hours during acute phase, then as needed. Heat for 30 minutes prior to activities is helpful.  As soon as the acute phase (the time when your back is too painful for you to do normal activities) is over, it is important to resume normal activities and work Arboriculturist. Back injuries can cause potentially marked changes in lifestyle. So it is important to attack these problems aggressively.  See your caregiver for continued problems. He or she can help or refer you for appropriate exercises, physical therapy and work hardening if needed.  If you are given narcotic medications for your condition, for the next 24 hours DO NOT:  Drive  Operate machinery or power tools.  Sign legal documents.  DO NOT drink alcohol, take sleeping pills or other medications that may interfere with treatment. If your caregiver has given you a follow-up appointment, it is very important to keep that appointment. Not keeping the appointment could result in a chronic or permanent injury, pain, and disability. If there is any problem keeping the appointment, you must call back to this facility for assistance.  SEEK IMMEDIATE MEDICAL CARE IF:  You develop numbness, tingling, weakness, or problems with the use of your arms or legs.  You develop severe back pain not relieved with medications.  You have changes in bowel or bladder control.  You have increasing pain in any areas of the body. Document Released: 07/03/2005 Document Revised: 09/25/2011 Document Reviewed: 02/05/2008 [facility] Patient Information 2014 [place], [place].

## 2013-01-15 NOTE — Progress Notes (Signed)
Subjective:    Patient ID: Heidi [name], female    DOB: June 29, 1941, 72 y.o.   MRN: [id number]  HPI  72 year old patient who presents with a chief complaint of back pain. She apparently fell in her bathroom on June 8 and was evaluated at an urgent care in [place] and referred to a [place] orthopedic physician. She continues to have the low back pain. She does not localize her pain well and also describes pain basically from her mid back down to her knees. Pain is aggravated by movement especially in the low back area. She has had a very difficult time sleeping due to the pain that is aggravated by any activities. She has been on Vicodin every 6 hours and Mobic 7.5 mg twice daily with meals. She is wondering if a muscle relaxer could also be helpful. She does have a followup orthopedic appointment scheduled  Past Medical History  Diagnosis Date  . COPD (chronic obstructive pulmonary disease)   . Hypothyroidism   . 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 Hyperlipidemia   . Pulmonary nodule   . 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. Depression   . Cerebrovascular disease   . Positive ANA (antinuclear antibody)   . Diverticulosis   . Esophageal stricture   . Arthritis   . History of pneumonia   . History of stroke     mild oncoming  . Pneumonia     HX of  . Shortness of breath     periodically with COPD flair up  . Internal hemorrhoids   . Rectal ulcer     History   Social History  . Marital Status: Widowed    Spouse Name: N/A    Number of Children: 3  . Years of Education: N/A   Occupational History  . RETIRED    Social History Main Topics  . Smoking status: Former Smoker    Quit date: 11/01/2009  . Smokeless tobacco: Never Used  . Alcohol Use: No  . Drug Use: No  . Sexually Active: Not Currently   Other Topics Concern  . Not on file   Social History Narrative  . No narrative on file    Past Surgical History  Procedure Laterality Date  . Hiatal hernia  repair      x 2  . Cataract extraction      bilateral  . Total abdominal hysterectomy w/ bilateral salpingoophorectomy  1998  . Bladder surgery      tack  . Hemicolectomy  2012    emergent surgery [facility]  . Tonsillectomy    . Colonoscopy  06/22/2011    Procedure: COLONOSCOPY;  Surgeon: Hart Carwin, MD;  Location: [facility];  Service: Endoscopy;  Laterality: N/A;  . Gastric varices banding  06/22/2011    Procedure: HEMORRHOID BANDING;  Surgeon: Hart Carwin, MD;  Location: [facility];  Service: Endoscopy;  Laterality: N/A;    Family History  Problem Relation Age of Onset  . Colon cancer Mother   . COPD Father   . Heart attack Father   . Stroke Maternal Aunt   . Colon polyps Brother   . Liver disease Maternal Aunt   . Breast cancer Maternal Aunt     Allergies  Allergen Reactions  . Penicillins Hives    Current Outpatient Prescriptions on File Prior to Visit  Medication Sig Dispense Refill  . furosemide (LASIX) 20 MG tablet Take 1 tablet (20 mg total) by  mouth daily.  7 tablet  0  . halobetasol (ULTRAVATE) 0.05 % ointment Apply topically 2 (two) times daily.        Marland Kitchen levothyroxine (SYNTHROID) 100 MCG tablet Take 1 tablet (100 mcg total) by mouth daily.  90 tablet  3  . temazepam (RESTORIL) 15 MG capsule Take 1 capsule (15 mg total) by mouth daily as needed for anxiety.  30 capsule  1  . tiotropium (SPIRIVA HANDIHALER) 18 MCG inhalation capsule Place 1 capsule (18 mcg total) into inhaler and inhale daily.  30 capsule  12   No current facility-administered medications on file prior to visit.    BP 130/80  Temp(Src) 97.5 F (36.4 C) (Oral)  Wt 80 lb (36.288 kg)  BMI 13.73 kg/m2       Review of Systems  Musculoskeletal: Positive for back pain.  Psychiatric/Behavioral: Positive for sleep disturbance.       Objective:   Physical Exam  Constitutional: She appears well-developed and well-nourished. No distress.  Elderly frail Paroxysms of pain  with movement mainly in the lumbar region Able to transfer from a sitting position to the examining table  Musculoskeletal:  Mild lumbar tenderness over the lower back. Paroxysms of pain with twisting at the waist          Assessment & Plan:   Apparent lumbar compression fracture.  (L3 superior endplate compression fracture ) The patient is asking about a muscle relaxer we'll give a prescription for Flexeril 5. She will continue her present regimen. Refills dispensed. Followup orthopedics  Constipation issues addressed

## 2013-01-19 DIAGNOSIS — J8410 Pulmonary fibrosis, unspecified: Secondary | ICD-10-CM | POA: Diagnosis present

## 2013-01-19 DIAGNOSIS — G8929 Other chronic pain: Secondary | ICD-10-CM | POA: Diagnosis not present

## 2013-01-19 DIAGNOSIS — M0510 Rheumatoid lung disease with rheumatoid arthritis of unspecified site: Secondary | ICD-10-CM | POA: Diagnosis present

## 2013-01-19 DIAGNOSIS — J9600 Acute respiratory failure, unspecified whether with hypoxia or hypercapnia: Secondary | ICD-10-CM | POA: Diagnosis not present

## 2013-01-19 DIAGNOSIS — R9431 Abnormal electrocardiogram [ECG] [EKG]: Secondary | ICD-10-CM | POA: Diagnosis not present

## 2013-01-19 DIAGNOSIS — Z8781 Personal history of (healed) traumatic fracture: Secondary | ICD-10-CM | POA: Diagnosis not present

## 2013-01-19 DIAGNOSIS — M549 Dorsalgia, unspecified: Secondary | ICD-10-CM | POA: Diagnosis not present

## 2013-01-19 DIAGNOSIS — E860 Dehydration: Secondary | ICD-10-CM | POA: Diagnosis not present

## 2013-01-19 DIAGNOSIS — J441 Chronic obstructive pulmonary disease with (acute) exacerbation: Secondary | ICD-10-CM | POA: Diagnosis not present

## 2013-01-19 DIAGNOSIS — E039 Hypothyroidism, unspecified: Secondary | ICD-10-CM | POA: Diagnosis not present

## 2013-01-19 DIAGNOSIS — T40601A Poisoning by unspecified narcotics, accidental (unintentional), initial encounter: Secondary | ICD-10-CM | POA: Diagnosis not present

## 2013-01-19 DIAGNOSIS — M546 Pain in thoracic spine: Secondary | ICD-10-CM | POA: Diagnosis not present

## 2013-01-19 DIAGNOSIS — R296 Repeated falls: Secondary | ICD-10-CM | POA: Diagnosis not present

## 2013-01-19 DIAGNOSIS — M545 Low back pain: Secondary | ICD-10-CM | POA: Diagnosis not present

## 2013-01-19 DIAGNOSIS — Z79899 Other long term (current) drug therapy: Secondary | ICD-10-CM | POA: Diagnosis not present

## 2013-01-19 DIAGNOSIS — J984 Other disorders of lung: Secondary | ICD-10-CM | POA: Diagnosis not present

## 2013-01-19 DIAGNOSIS — G8921 Chronic pain due to trauma: Secondary | ICD-10-CM | POA: Diagnosis not present

## 2013-01-19 DIAGNOSIS — G8911 Acute pain due to trauma: Secondary | ICD-10-CM | POA: Diagnosis not present

## 2013-01-19 DIAGNOSIS — S22009A Unspecified fracture of unspecified thoracic vertebra, initial encounter for closed fracture: Secondary | ICD-10-CM | POA: Diagnosis not present

## 2013-01-19 DIAGNOSIS — Z9181 History of falling: Secondary | ICD-10-CM | POA: Diagnosis not present

## 2013-01-24 DIAGNOSIS — M546 Pain in thoracic spine: Secondary | ICD-10-CM | POA: Diagnosis not present

## 2013-01-24 DIAGNOSIS — M6281 Muscle weakness (generalized): Secondary | ICD-10-CM | POA: Diagnosis not present

## 2013-01-24 DIAGNOSIS — IMO0001 Reserved for inherently not codable concepts without codable children: Secondary | ICD-10-CM | POA: Diagnosis not present

## 2013-01-24 DIAGNOSIS — R269 Unspecified abnormalities of gait and mobility: Secondary | ICD-10-CM | POA: Diagnosis not present

## 2013-01-28 DIAGNOSIS — IMO0001 Reserved for inherently not codable concepts without codable children: Secondary | ICD-10-CM | POA: Diagnosis not present

## 2013-01-28 DIAGNOSIS — R269 Unspecified abnormalities of gait and mobility: Secondary | ICD-10-CM | POA: Diagnosis not present

## 2013-01-28 DIAGNOSIS — M6281 Muscle weakness (generalized): Secondary | ICD-10-CM | POA: Diagnosis not present

## 2013-01-28 DIAGNOSIS — M546 Pain in thoracic spine: Secondary | ICD-10-CM | POA: Diagnosis not present

## 2013-01-30 DIAGNOSIS — M546 Pain in thoracic spine: Secondary | ICD-10-CM | POA: Diagnosis not present

## 2013-01-30 DIAGNOSIS — IMO0001 Reserved for inherently not codable concepts without codable children: Secondary | ICD-10-CM | POA: Diagnosis not present

## 2013-01-30 DIAGNOSIS — R269 Unspecified abnormalities of gait and mobility: Secondary | ICD-10-CM | POA: Diagnosis not present

## 2013-01-30 DIAGNOSIS — M6281 Muscle weakness (generalized): Secondary | ICD-10-CM | POA: Diagnosis not present

## 2013-02-04 DIAGNOSIS — R269 Unspecified abnormalities of gait and mobility: Secondary | ICD-10-CM | POA: Diagnosis not present

## 2013-02-04 DIAGNOSIS — M546 Pain in thoracic spine: Secondary | ICD-10-CM | POA: Diagnosis not present

## 2013-02-04 DIAGNOSIS — M6281 Muscle weakness (generalized): Secondary | ICD-10-CM | POA: Diagnosis not present

## 2013-02-04 DIAGNOSIS — IMO0001 Reserved for inherently not codable concepts without codable children: Secondary | ICD-10-CM | POA: Diagnosis not present

## 2013-02-05 ENCOUNTER — Encounter: Payer: Self-pay | Admitting: Family

## 2013-02-05 ENCOUNTER — Ambulatory Visit (INDEPENDENT_AMBULATORY_CARE_PROVIDER_SITE_OTHER): Payer: Medicare Other | Admitting: Family

## 2013-02-05 VITALS — BP 130/90 | HR 82 | Wt 80.0 lb

## 2013-02-05 DIAGNOSIS — G8929 Other chronic pain: Secondary | ICD-10-CM

## 2013-02-05 DIAGNOSIS — E039 Hypothyroidism, unspecified: Secondary | ICD-10-CM

## 2013-02-05 DIAGNOSIS — K5900 Constipation, unspecified: Secondary | ICD-10-CM | POA: Diagnosis not present

## 2013-02-05 DIAGNOSIS — M545 Low back pain: Secondary | ICD-10-CM

## 2013-02-05 MED ORDER — TEMAZEPAM 15 MG PO CAPS: 15.0000 mg | ORAL_CAPSULE | Freq: Every day | ORAL | Status: DC | PRN

## 2013-02-05 NOTE — Progress Notes (Signed)
Subjective:    Patient ID: Heidi [name], female    DOB: 05-11-1941, 72 y.o.   MRN: [id number]  HPI Pt is a 72 year old white female who presents to the PCP post hospital follow up. Pt experienced a fall at home and was seen in the ED where a hairline spinal fracture was identified x 2 weeks ago. Pt has since been seen by ortho and working with PT. Reports increased mobility and effective pain management. Pt discussed chronic constipation, States LBM was 7/19; but reports only having 1 BM per month for the past 3 months. States she takes OTC laxatives which causes abd cramping and only moves bowels on an infrequent basis.   Review of Systems  Constitutional: Negative.   HENT: Negative.   Eyes: Negative.   Respiratory: Negative.   Cardiovascular: Negative.   Gastrointestinal: Positive for constipation.  Endocrine: Negative.   Genitourinary: Negative.   Musculoskeletal: Positive for back pain and gait problem.  Skin: Negative.   Allergic/Immunologic: Negative.   Neurological: Negative.   Hematological: Negative.   Psychiatric/Behavioral: Negative.        Past Medical History  Diagnosis Date  . COPD (chronic obstructive pulmonary disease)   . Hypothyroidism   . 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 Hyperlipidemia   . Pulmonary nodule   . 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. Depression   . Cerebrovascular disease   . Positive ANA (antinuclear antibody)   . Diverticulosis   . Esophageal stricture   . Arthritis   . History of pneumonia   . History of stroke     mild oncoming  . Pneumonia     HX of  . Shortness of breath     periodically with COPD flair up  . Internal hemorrhoids   . Rectal ulcer     History   Social History  . Marital Status: Widowed    Spouse Name: N/A    Number of Children: 3  . Years of Education: N/A   Occupational History  . RETIRED    Social History Main Topics  . Smoking status: Former Smoker    Quit date: 11/01/2009  .  Smokeless tobacco: Never Used  . Alcohol Use: No  . Drug Use: No  . Sexually Active: Not Currently   Other Topics Concern  . Not on file   Social History Narrative  . No narrative on file    Past Surgical History  Procedure Laterality Date  . Hiatal hernia repair      x 2  . Cataract extraction      bilateral  . Total abdominal hysterectomy w/ bilateral salpingoophorectomy  1998  . Bladder surgery      tack  . Hemicolectomy  2012    emergent surgery [facility]  . Tonsillectomy    . Colonoscopy  06/22/2011    Procedure: COLONOSCOPY;  Surgeon: Hart Carwin, MD;  Location: [facility];  Service: Endoscopy;  Laterality: N/A;  . Gastric varices banding  06/22/2011    Procedure: HEMORRHOID BANDING;  Surgeon: Hart Carwin, MD;  Location: [facility];  Service: Endoscopy;  Laterality: N/A;    Family History  Problem Relation Age of Onset  . Colon cancer Mother   . COPD Father   . Heart attack Father   . Stroke Maternal Aunt   . Colon polyps Brother   . Liver disease Maternal Aunt   . Breast cancer Maternal Aunt  Allergies  Allergen Reactions  . Penicillins Hives    Current Outpatient Prescriptions on File Prior to Visit  Medication Sig Dispense Refill  . cyclobenzaprine (FLEXERIL) 5 MG tablet Take 1 tablet (5 mg total) by mouth 3 (three) times daily as needed for muscle spasms.  30 tablet  1  . halobetasol (ULTRAVATE) 0.05 % ointment Apply topically 2 (two) times daily.        Marland Kitchen levothyroxine (SYNTHROID) 100 MCG tablet Take 1 tablet (100 mcg total) by mouth daily.  90 tablet  3  . meloxicam (MOBIC) 7.5 MG tablet Take 1 tablet (7.5 mg total) by mouth daily.  30 tablet  0  . polyethylene glycol powder (GLYCOLAX/MIRALAX) powder Take 17 g by mouth 2 (two) times daily as needed.  3350 g  1  . tiotropium (SPIRIVA HANDIHALER) 18 MCG inhalation capsule Place 1 capsule (18 mcg total) into inhaler and inhale daily.  30 capsule  12  . furosemide (LASIX) 20 MG tablet  Take 1 tablet (20 mg total) by mouth daily.  7 tablet  0  . HYDROcodone-acetaminophen (NORCO) 10-325 MG per tablet Take 1 tablet by mouth every 8 (eight) hours as needed for pain.  30 tablet  0   No current facility-administered medications on file prior to visit.    BP 130/90  Pulse 82  Wt 80 lb (36.288 kg)  BMI 13.73 kg/m2  SpO2 91%chart Objective:   Physical Exam  Constitutional: She is oriented to person, place, and time.  Underweight but stable  HENT:  Head: Normocephalic and atraumatic.  Eyes: Pupils are equal, round, and reactive to light.  Neck: Normal range of motion. Neck supple.  Cardiovascular: Normal rate and regular rhythm.   Pulmonary/Chest: Effort normal and breath sounds normal.  Abdominal: Soft. Bowel sounds are normal.  Musculoskeletal:  Gait slow but steady  Neurological: She is alert and oriented to person, place, and time.  Skin: Skin is warm and dry.          Assessment & Plan:  1. Back Pain 2. Constipation  Pt to follow up with Ortho regarding back and mobility issue, currently has an appointment for tomorrow. Pt is to also follow up with GI since constipation has been a chronic issue, and she has been followed by GI for the past 2 years. Pt to contact PCP with any questions or concerns.   Note by Davonna Belling, FNP Student

## 2013-02-06 DIAGNOSIS — S32009A Unspecified fracture of unspecified lumbar vertebra, initial encounter for closed fracture: Secondary | ICD-10-CM | POA: Diagnosis not present

## 2013-02-07 DIAGNOSIS — M546 Pain in thoracic spine: Secondary | ICD-10-CM | POA: Diagnosis not present

## 2013-02-07 DIAGNOSIS — M6281 Muscle weakness (generalized): Secondary | ICD-10-CM | POA: Diagnosis not present

## 2013-02-07 DIAGNOSIS — IMO0001 Reserved for inherently not codable concepts without codable children: Secondary | ICD-10-CM | POA: Diagnosis not present

## 2013-02-07 DIAGNOSIS — R269 Unspecified abnormalities of gait and mobility: Secondary | ICD-10-CM | POA: Diagnosis not present

## 2013-02-10 DIAGNOSIS — S22009A Unspecified fracture of unspecified thoracic vertebra, initial encounter for closed fracture: Secondary | ICD-10-CM | POA: Diagnosis not present

## 2013-02-10 DIAGNOSIS — M5126 Other intervertebral disc displacement, lumbar region: Secondary | ICD-10-CM | POA: Diagnosis not present

## 2013-02-11 DIAGNOSIS — R269 Unspecified abnormalities of gait and mobility: Secondary | ICD-10-CM | POA: Diagnosis not present

## 2013-02-11 DIAGNOSIS — M546 Pain in thoracic spine: Secondary | ICD-10-CM | POA: Diagnosis not present

## 2013-02-11 DIAGNOSIS — IMO0001 Reserved for inherently not codable concepts without codable children: Secondary | ICD-10-CM | POA: Diagnosis not present

## 2013-02-11 DIAGNOSIS — M6281 Muscle weakness (generalized): Secondary | ICD-10-CM | POA: Diagnosis not present

## 2013-02-13 DIAGNOSIS — M546 Pain in thoracic spine: Secondary | ICD-10-CM | POA: Diagnosis not present

## 2013-02-13 DIAGNOSIS — R269 Unspecified abnormalities of gait and mobility: Secondary | ICD-10-CM | POA: Diagnosis not present

## 2013-02-13 DIAGNOSIS — M6281 Muscle weakness (generalized): Secondary | ICD-10-CM | POA: Diagnosis not present

## 2013-02-13 DIAGNOSIS — IMO0001 Reserved for inherently not codable concepts without codable children: Secondary | ICD-10-CM | POA: Diagnosis not present

## 2013-02-20 DIAGNOSIS — R269 Unspecified abnormalities of gait and mobility: Secondary | ICD-10-CM | POA: Diagnosis not present

## 2013-02-20 DIAGNOSIS — M546 Pain in thoracic spine: Secondary | ICD-10-CM | POA: Diagnosis not present

## 2013-02-20 DIAGNOSIS — IMO0001 Reserved for inherently not codable concepts without codable children: Secondary | ICD-10-CM | POA: Diagnosis not present

## 2013-02-20 DIAGNOSIS — M6281 Muscle weakness (generalized): Secondary | ICD-10-CM | POA: Diagnosis not present

## 2013-03-02 ENCOUNTER — Other Ambulatory Visit: Payer: Self-pay | Admitting: Internal Medicine

## 2013-04-01 ENCOUNTER — Other Ambulatory Visit: Payer: Self-pay | Admitting: Family

## 2013-04-16 ENCOUNTER — Ambulatory Visit (INDEPENDENT_AMBULATORY_CARE_PROVIDER_SITE_OTHER): Payer: Medicare Other | Admitting: Internal Medicine

## 2013-04-16 ENCOUNTER — Encounter: Payer: Self-pay | Admitting: Internal Medicine

## 2013-04-16 VITALS — BP 128/80 | HR 76 | Temp 97.7°F | Ht 64.0 in | Wt 78.0 lb

## 2013-04-16 DIAGNOSIS — J449 Chronic obstructive pulmonary disease, unspecified: Secondary | ICD-10-CM | POA: Diagnosis not present

## 2013-04-16 DIAGNOSIS — L408 Other psoriasis: Secondary | ICD-10-CM

## 2013-04-16 DIAGNOSIS — Z23 Encounter for immunization: Secondary | ICD-10-CM | POA: Diagnosis not present

## 2013-04-16 DIAGNOSIS — E039 Hypothyroidism, unspecified: Secondary | ICD-10-CM

## 2013-04-16 NOTE — Progress Notes (Signed)
Hypothyroid- has had difficulty compying with meds  Chronic back pain- seeing ortho in [place]. Using a back  brace  COPD- she continues to smoke- "some"  Past Medical History  Diagnosis Date  . COPD (chronic obstructive pulmonary disease)   . Hypothyroidism   . 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 Hyperlipidemia   . Pulmonary nodule   . 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. Depression   . Cerebrovascular disease   . Positive ANA (antinuclear antibody)   . Diverticulosis   . Esophageal stricture   . Arthritis   . History of pneumonia   . History of stroke     mild oncoming  . Pneumonia     HX of  . Shortness of breath     periodically with COPD flair up  . Internal hemorrhoids   . Rectal ulcer     History   Social History  . Marital Status: Widowed    Spouse Name: N/A    Number of Children: 3  . Years of Education: N/A   Occupational History  . RETIRED    Social History Main Topics  . Smoking status: Former Smoker    Quit date: 11/01/2009  . Smokeless tobacco: Never Used  . Alcohol Use: No  . Drug Use: No  . Sexual Activity: Not Currently   Other Topics Concern  . Not on file   Social History Narrative  . No narrative on file    Past Surgical History  Procedure Laterality Date  . Hiatal hernia repair      x 2  . Cataract extraction      bilateral  . Total abdominal hysterectomy w/ bilateral salpingoophorectomy  1998  . Bladder surgery      tack  . Hemicolectomy  2012    emergent surgery [facility]  . Tonsillectomy    . Colonoscopy  06/22/2011    Procedure: COLONOSCOPY;  Surgeon: Hart Carwin, MD;  Location: [facility];  Service: Endoscopy;  Laterality: N/A;  . Gastric varices banding  06/22/2011    Procedure: HEMORRHOID BANDING;  Surgeon: Hart Carwin, MD;  Location: [facility];  Service: Endoscopy;  Laterality: N/A;    Family History  Problem Relation Age of Onset  . Colon cancer Mother   . COPD Father   . Heart  attack Father   . Stroke Maternal Aunt   . Colon polyps Brother   . Liver disease Maternal Aunt   . Breast cancer Maternal Aunt     Allergies  Allergen Reactions  . Penicillins Hives    Current Outpatient Prescriptions on File Prior to Visit  Medication Sig Dispense Refill  . cyclobenzaprine (FLEXERIL) 5 MG tablet TAKE ONE TABLET BY MOUTH THREE TIMES DAILY AS NEEDED FOR MUSCLE SPASM  30 tablet  0  . halobetasol (ULTRAVATE) 0.05 % ointment Apply topically 2 (two) times daily.        Marland Kitchen levothyroxine (SYNTHROID) 100 MCG tablet Take 1 tablet (100 mcg total) by mouth daily.  90 tablet  3  . meloxicam (MOBIC) 7.5 MG tablet TAKE ONE TABLET BY MOUTH ONCE DAILY  30 tablet  0  . temazepam (RESTORIL) 15 MG capsule TAKE ONE CAPSULE BY MOUTH ONCE DAILY AS NEEDED FOR ANXIETY  30 capsule  2  . tiotropium (SPIRIVA HANDIHALER) 18 MCG inhalation capsule Place 1 capsule (18 mcg total) into inhaler and inhale daily.  30 capsule  12   No current facility-administered medications on  file prior to visit.     patient denies chest pain, shortness of breath, orthopnea. Denies lower extremity edema, abdominal pain, change in appetite, change in bowel movements. Patient denies rashes, musculoskeletal complaints. No other specific complaints in a complete review of systems.   BP 128/80  Pulse 76  Temp(Src) 97.7 F (36.5 C) (Oral)  Ht 5\' 4"  (1.626 m)  Wt 78 lb (35.381 kg)  BMI 13.38 kg/m2   cachectic appearing female in no acute distress. HEENT exam atraumatic, normocephalic, extraocular muscles are intact. Neck is supple. No jugular venous distention no thyromegaly. Chest clear to auscultation without increased work of breathing. Cardiac exam S1 and S2 are regular. Abdominal exam active bowel sounds, soft, nontender. Extremities no edema. Neurologic exam she is alert without any motor sensory deficits. Gait is normal. Derm- no psoriatic plaques

## 2013-04-16 NOTE — Assessment & Plan Note (Signed)
Discussed need to quit smoking- absolute abstinence

## 2013-04-16 NOTE — Assessment & Plan Note (Addendum)
Check labs today.

## 2013-04-16 NOTE — Assessment & Plan Note (Signed)
No recurrence. 

## 2013-04-25 ENCOUNTER — Telehealth: Payer: Self-pay | Admitting: Internal Medicine

## 2013-04-25 NOTE — Telephone Encounter (Signed)
Pt would like blood work results °

## 2013-04-29 NOTE — Telephone Encounter (Signed)
Gave pt normal thyroid results

## 2013-04-29 NOTE — Telephone Encounter (Signed)
Pt calling again to inquire about lab results.

## 2013-05-06 DIAGNOSIS — S32009A Unspecified fracture of unspecified lumbar vertebra, initial encounter for closed fracture: Secondary | ICD-10-CM | POA: Diagnosis not present

## 2013-06-30 ENCOUNTER — Other Ambulatory Visit: Payer: Self-pay | Admitting: Family

## 2013-08-07 DIAGNOSIS — S32009A Unspecified fracture of unspecified lumbar vertebra, initial encounter for closed fracture: Secondary | ICD-10-CM | POA: Diagnosis not present

## 2013-09-29 ENCOUNTER — Other Ambulatory Visit: Payer: Self-pay | Admitting: Internal Medicine

## 2013-10-01 NOTE — Telephone Encounter (Signed)
Pt following up on request for temazepam (RESTORIL) 15 MG capsule 30 day w/ refills Pt needs new pharm: [facility] [place]   [phone]

## 2013-10-30 ENCOUNTER — Other Ambulatory Visit: Payer: Self-pay | Admitting: Internal Medicine

## 2013-10-30 NOTE — Telephone Encounter (Signed)
Pt following up on request for this med. Pt is worried bc she is not supposed to go w/out levothyroxine (SYNTHROID) 100 MCG tablet Pt is out of meds and would like rx asap [facility]

## 2013-11-04 DIAGNOSIS — S32009A Unspecified fracture of unspecified lumbar vertebra, initial encounter for closed fracture: Secondary | ICD-10-CM | POA: Diagnosis not present

## 2013-12-03 ENCOUNTER — Encounter: Payer: Self-pay | Admitting: *Deleted

## 2013-12-03 ENCOUNTER — Ambulatory Visit (INDEPENDENT_AMBULATORY_CARE_PROVIDER_SITE_OTHER): Payer: Medicare Other | Admitting: Internal Medicine

## 2013-12-03 ENCOUNTER — Encounter: Payer: Self-pay | Admitting: Internal Medicine

## 2013-12-03 VITALS — BP 138/76 | HR 76 | Temp 97.9°F | Ht 64.0 in | Wt 94.0 lb

## 2013-12-03 DIAGNOSIS — E785 Hyperlipidemia, unspecified: Secondary | ICD-10-CM | POA: Diagnosis not present

## 2013-12-03 DIAGNOSIS — Z79899 Other long term (current) drug therapy: Secondary | ICD-10-CM | POA: Diagnosis not present

## 2013-12-03 DIAGNOSIS — E039 Hypothyroidism, unspecified: Secondary | ICD-10-CM | POA: Diagnosis not present

## 2013-12-03 DIAGNOSIS — L408 Other psoriasis: Secondary | ICD-10-CM

## 2013-12-03 DIAGNOSIS — J449 Chronic obstructive pulmonary disease, unspecified: Secondary | ICD-10-CM | POA: Diagnosis not present

## 2013-12-03 DIAGNOSIS — R350 Frequency of micturition: Secondary | ICD-10-CM | POA: Diagnosis not present

## 2013-12-03 LAB — CBC WITH DIFFERENTIAL/PLATELET
BASOS PCT: 0.6 % (ref 0.0–3.0)
Basophils Absolute: 0 10*3/uL (ref 0.0–0.1)
EOS ABS: 0 10*3/uL (ref 0.0–0.7)
EOS PCT: 0.9 % (ref 0.0–5.0)
HEMATOCRIT: 42.7 % (ref 36.0–46.0)
Hemoglobin: 14.5 g/dL (ref 12.0–15.0)
LYMPHS ABS: 1.8 10*3/uL (ref 0.7–4.0)
Lymphocytes Relative: 41.7 % (ref 12.0–46.0)
MCHC: 33.9 g/dL (ref 30.0–36.0)
MCV: 95.9 fl (ref 78.0–100.0)
MONO ABS: 0.4 10*3/uL (ref 0.1–1.0)
Monocytes Relative: 9.6 % (ref 3.0–12.0)
NEUTROS ABS: 2 10*3/uL (ref 1.4–7.7)
NEUTROS PCT: 47.2 % (ref 43.0–77.0)
Platelets: 237 10*3/uL (ref 150.0–400.0)
RBC: 4.45 Mil/uL (ref 3.87–5.11)
RDW: 14.4 % (ref 11.5–15.5)
WBC: 4.3 10*3/uL (ref 4.0–10.5)

## 2013-12-03 LAB — POCT URINALYSIS DIPSTICK
Bilirubin, UA: NEGATIVE
Glucose, UA: NEGATIVE
Ketones, UA: NEGATIVE
Leukocytes, UA: NEGATIVE
NITRITE UA: NEGATIVE
PH UA: 7
Protein, UA: NEGATIVE
RBC UA: NEGATIVE
SPEC GRAV UA: 1.01
UROBILINOGEN UA: 0.2

## 2013-12-03 LAB — BASIC METABOLIC PANEL
BUN: 9 mg/dL (ref 6–23)
CHLORIDE: 101 meq/L (ref 96–112)
CO2: 33 meq/L — AB (ref 19–32)
CREATININE: 0.7 mg/dL (ref 0.4–1.2)
Calcium: 9.7 mg/dL (ref 8.4–10.5)
GFR: 93.5 mL/min (ref >60.00)
Glucose, Bld: 73 mg/dL (ref 70–99)
POTASSIUM: 5.5 meq/L — AB (ref 3.5–5.1)
Sodium: 140 mEq/L (ref 135–145)

## 2013-12-03 LAB — LIPID PANEL
Cholesterol: 303 mg/dL — ABNORMAL HIGH (ref 0–200)
HDL: 65.5 mg/dL (ref >39.00)
LDL Cholesterol: 220 mg/dL — ABNORMAL HIGH (ref 0–99)
Total CHOL/HDL Ratio: 5
Triglycerides: 88 mg/dL (ref 0.0–149.0)
VLDL: 17.6 mg/dL (ref 0.0–40.0)

## 2013-12-03 LAB — TSH: TSH: 38.56 u[IU]/mL — ABNORMAL HIGH (ref 0.35–4.50)

## 2013-12-03 LAB — HEPATIC FUNCTION PANEL
ALT: 12 U/L (ref 0–35)
AST: 23 U/L (ref 0–37)
Albumin: 4.5 g/dL (ref 3.5–5.2)
Alkaline Phosphatase: 40 U/L (ref 39–117)
Bilirubin, Direct: 0.1 mg/dL (ref 0.0–0.3)
Total Bilirubin: 0.5 mg/dL (ref 0.2–1.2)
Total Protein: 7.2 g/dL (ref 6.0–8.3)

## 2013-12-03 NOTE — Progress Notes (Signed)
Urinary frequency- duration. 6 months.   Reviewed fall risk- no falls since 12/2012. When she fell at that time she was sick and fell over her comforter (on bed).   Hypothyroid-  Lab Results  Component Value Date   TSH 1.73 04/16/2013   She has a hx of medication noncompliance  Psoriasis- no recent flares  Anxiety uses temasepam prn  Past Medical History  Diagnosis Date  . COPD (chronic obstructive pulmonary disease)   . Hypothyroidism   . 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 Hyperlipidemia   . Pulmonary nodule   . 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. Depression   . Cerebrovascular disease   . Positive ANA (antinuclear antibody)   . Diverticulosis   . Esophageal stricture   . Arthritis   . History of pneumonia   . History of stroke     mild oncoming  . Pneumonia     HX of  . Shortness of breath     periodically with COPD flair up  . Internal hemorrhoids   . Rectal ulcer     History   Social History  . Marital Status: Widowed    Spouse Name: N/A    Number of Children: 3  . Years of Education: N/A   Occupational History  . RETIRED    Social History Main Topics  . Smoking status: Former Smoker    Quit date: 11/01/2009  . Smokeless tobacco: Never Used  . Alcohol Use: No  . Drug Use: No  . Sexual Activity: Not Currently   Other Topics Concern  . Not on file   Social History Narrative  . No narrative on file    Past Surgical History  Procedure Laterality Date  . Hiatal hernia repair      x 2  . Cataract extraction      bilateral  . Total abdominal hysterectomy w/ bilateral salpingoophorectomy  1998  . Bladder surgery      tack  . Hemicolectomy  2012    emergent surgery [facility]  . Tonsillectomy    . Colonoscopy  06/22/2011    Procedure: COLONOSCOPY;  Surgeon: Lafayette Dragon, MD;  Location: [facility];  Service: Endoscopy;  Laterality: N/A;  . Gastric varices banding  06/22/2011    Procedure: HEMORRHOID BANDING;  Surgeon:  Lafayette Dragon, MD;  Location: [facility];  Service: Endoscopy;  Laterality: N/A;    Family History  Problem Relation Age of Onset  . Colon cancer Mother   . COPD Father   . Heart attack Father   . Stroke Maternal Aunt   . Colon polyps Brother   . Liver disease Maternal Aunt   . Breast cancer Maternal Aunt     Allergies  Allergen Reactions  . Penicillins Hives    Current Outpatient Prescriptions on File Prior to Visit  Medication Sig Dispense Refill  . halobetasol (ULTRAVATE) 0.05 % ointment Apply topically 2 (two) times daily.        . temazepam (RESTORIL) 15 MG capsule TAKE ONE CAPSULE BY MOUTH AS NEEDED FOR ANXIETY  30 capsule  3  . tiotropium (SPIRIVA HANDIHALER) 18 MCG inhalation capsule Place 1 capsule (18 mcg total) into inhaler and inhale daily.  30 capsule  12   No current facility-administered medications on file prior to visit.     patient denies chest pain, shortness of breath, orthopnea. Denies lower extremity edema, abdominal pain, change in appetite, change in bowel movements. Patient  denies rashes, musculoskeletal complaints. No other specific complaints in a complete review of systems.   BP 138/76  Pulse 76  Temp(Src) 97.9 F (36.6 C) (Oral)  Ht 5\' 4"  (1.626 m)  Wt 94 lb (42.638 kg)  BMI 16.13 kg/m2 Thin female in no acute distress. Chest clear to auscultation. Cardiac exam S1-S2 are regular. Abdominal exam active bowel sounds, soft her extremities no edema.  HYPOTHYROIDISM Check labs today and make adjustments as necessary.  PSORIASIS She has regular followup with dermatology.  HYPERLIPIDEMIA This has historically been related to noncompliance with thyroid replacement therapy.

## 2013-12-03 NOTE — Progress Notes (Signed)
Pre visit review using our clinic review tool, if applicable. No additional management support is needed unless otherwise documented below in the visit note. 

## 2013-12-05 ENCOUNTER — Other Ambulatory Visit (INDEPENDENT_AMBULATORY_CARE_PROVIDER_SITE_OTHER): Payer: Medicare Other

## 2013-12-05 ENCOUNTER — Other Ambulatory Visit: Payer: Self-pay | Admitting: *Deleted

## 2013-12-05 DIAGNOSIS — E785 Hyperlipidemia, unspecified: Secondary | ICD-10-CM

## 2013-12-05 LAB — BASIC METABOLIC PANEL
BUN: 9 mg/dL (ref 6–23)
CHLORIDE: 100 meq/L (ref 96–112)
CO2: 31 mEq/L (ref 19–32)
CREATININE: 0.7 mg/dL (ref 0.4–1.2)
Calcium: 9.6 mg/dL (ref 8.4–10.5)
GFR: 84.57 mL/min (ref >60.00)
Glucose, Bld: 77 mg/dL (ref 70–99)
Potassium: 4.5 mEq/L (ref 3.5–5.1)
Sodium: 139 mEq/L (ref 135–145)

## 2013-12-05 MED ORDER — LEVOTHYROXINE SODIUM 125 MCG PO TABS: 125.0000 ug | ORAL_TABLET | Freq: Every day | ORAL | Status: DC

## 2013-12-06 LAB — URINE CULTURE: Colony Count: >=100000

## 2013-12-07 NOTE — Assessment & Plan Note (Signed)
This has historically been related to noncompliance with thyroid replacement therapy.

## 2013-12-07 NOTE — Assessment & Plan Note (Signed)
Check labs today and make adjustments as necessary.

## 2013-12-07 NOTE — Assessment & Plan Note (Signed)
She has regular followup with dermatology.

## 2013-12-11 ENCOUNTER — Telehealth: Payer: Self-pay | Admitting: Internal Medicine

## 2013-12-11 NOTE — Telephone Encounter (Signed)
Pt would like to know results of the labs that were done last Fri.  Pt states she is urinating every 30 min or so.  Do you want me to sch the lab for 6 wks for tsh also?

## 2013-12-15 ENCOUNTER — Other Ambulatory Visit: Payer: Self-pay | Admitting: *Deleted

## 2013-12-15 MED ORDER — NITROFURANTOIN MONOHYD MACRO 100 MG PO CAPS: 100.0000 mg | ORAL_CAPSULE | Freq: Two times a day (BID) | ORAL | Status: DC

## 2013-12-15 NOTE — Telephone Encounter (Signed)
See result note.  

## 2014-01-26 ENCOUNTER — Other Ambulatory Visit: Payer: Self-pay | Admitting: Internal Medicine

## 2014-02-04 DIAGNOSIS — M545 Low back pain, unspecified: Secondary | ICD-10-CM | POA: Diagnosis not present

## 2014-02-10 ENCOUNTER — Encounter: Payer: Self-pay | Admitting: Internal Medicine

## 2014-04-08 DIAGNOSIS — Z23 Encounter for immunization: Secondary | ICD-10-CM | POA: Diagnosis not present

## 2014-05-19 ENCOUNTER — Telehealth: Payer: Self-pay | Admitting: Internal Medicine

## 2014-05-19 NOTE — Telephone Encounter (Signed)
Pt request refill of the following: levothyroxine (SYNTHROID, LEVOTHROID) 125 MCG tablet, temazepam (RESTORIL) 15 MG capsule   Phamacy: [facility] [address] [place]

## 2014-05-20 MED ORDER — LEVOTHYROXINE SODIUM 125 MCG PO TABS: 125.0000 ug | ORAL_TABLET | Freq: Every day | ORAL | Status: DC

## 2014-05-20 MED ORDER — TEMAZEPAM 15 MG PO CAPS: ORAL_CAPSULE | ORAL | Status: DC

## 2014-05-20 NOTE — Telephone Encounter (Signed)
rx sent in electornically

## 2014-05-21 ENCOUNTER — Other Ambulatory Visit: Payer: Self-pay | Admitting: Internal Medicine

## 2014-10-19 ENCOUNTER — Ambulatory Visit: Payer: Medicare Other | Admitting: Family Medicine

## 2014-11-27 ENCOUNTER — Telehealth: Payer: Self-pay | Admitting: *Deleted

## 2014-11-27 DIAGNOSIS — Z1231 Encounter for screening mammogram for malignant neoplasm of breast: Secondary | ICD-10-CM

## 2014-11-27 DIAGNOSIS — Z780 Asymptomatic menopausal state: Secondary | ICD-10-CM

## 2014-11-27 NOTE — Telephone Encounter (Signed)
Left message for pt to call back.  Need to know if they have had a mammogram this year

## 2014-11-30 ENCOUNTER — Other Ambulatory Visit: Payer: Self-pay | Admitting: Internal Medicine

## 2014-11-30 ENCOUNTER — Telehealth: Payer: Self-pay | Admitting: Internal Medicine

## 2014-11-30 NOTE — Telephone Encounter (Signed)
Pharm states her rx temazepam (RESTORIL) 15 MG capsule b/c her rx expired for  Her 5 mo refill

## 2014-11-30 NOTE — Telephone Encounter (Signed)
Pt request refill temazepam (RESTORIL) 15 MG capsule. The pharm states her 6 mo rx expired on 11/20/14, even though she had a refill on it. Pt is down to one tab.  Pt has appt w/ cory on 5/31 bur needs 30 days to get her through. Thnaks.  [facility] [address]

## 2014-12-01 NOTE — Telephone Encounter (Signed)
[facility] called into [facility]

## 2014-12-15 ENCOUNTER — Encounter: Payer: Self-pay | Admitting: Adult Health

## 2014-12-15 ENCOUNTER — Ambulatory Visit (INDEPENDENT_AMBULATORY_CARE_PROVIDER_SITE_OTHER): Payer: Medicare Other | Admitting: Adult Health

## 2014-12-15 VITALS — BP 102/70 | HR 83 | Temp 98.6°F | Ht 59.5 in | Wt 84.9 lb

## 2014-12-15 DIAGNOSIS — J410 Simple chronic bronchitis: Secondary | ICD-10-CM

## 2014-12-15 DIAGNOSIS — R634 Abnormal weight loss: Secondary | ICD-10-CM

## 2014-12-15 DIAGNOSIS — F32A Depression, unspecified: Secondary | ICD-10-CM

## 2014-12-15 DIAGNOSIS — E039 Hypothyroidism, unspecified: Secondary | ICD-10-CM | POA: Diagnosis not present

## 2014-12-15 DIAGNOSIS — G4700 Insomnia, unspecified: Secondary | ICD-10-CM

## 2014-12-15 DIAGNOSIS — F329 Major depressive disorder, single episode, unspecified: Secondary | ICD-10-CM | POA: Diagnosis not present

## 2014-12-15 LAB — TSH: TSH: 124 u[IU]/mL — ABNORMAL HIGH (ref 0.35–4.50)

## 2014-12-15 MED ORDER — MIRTAZAPINE 15 MG PO TABS: 15.0000 mg | ORAL_TABLET | Freq: Every day | ORAL | Status: DC

## 2014-12-15 MED ORDER — FLUTICASONE-SALMETEROL 100-50 MCG/DOSE IN AEPB: 1.0000 | INHALATION_SPRAY | Freq: Two times a day (BID) | RESPIRATORY_TRACT | Status: DC

## 2014-12-15 NOTE — Patient Instructions (Signed)
Please do not take your Restoril at night. Start taking the Remeron before you go to bed.   I will let you know what your TSH shows. START TAKING YOUR MEDICATION EVERY DAY!!!.   Follow up with me in in three weeks.

## 2014-12-15 NOTE — Assessment & Plan Note (Signed)
Is not taking her medications daily. Reinforced that it is important that she be taking her medications every day.

## 2014-12-15 NOTE — Progress Notes (Signed)
HPI:  Heidi [name] is here to establish care.   Last PCP and physical: March 2015 with MD Swords    Immunizations: UTD Diet: Exercise: Colonoscopy: 06/2011 - Needs to go back every five years.  Dexa:Needs to schedule.  Mammogram:Needs to schedule.  Eye doctor: 2012    Has the following chronic problems that require follow up and concerns today:   Falls  First fall in 2014, getting in the bathtub - this caused an injury to her back. More recently, two Sundays ago, she was walking without her walker and her " leg gave out" causing her to fall on her face. Denies any injury. Last Sunday she was walking again, without her walker and her leg gave out again, causing her to fall in the bathroom. She did not injure anything this time either..   Depression - Has multiple family members living at home, no one is helping pay the bills. She has also had two recent deaths ( fiance and son in law) August and October. She is not getting out of the house, she states " I don't want to leave." She does not know why, it is just the way she feels. Is taking Restoril for Insomnia and anxiety but nothing for depression.    Denies SI but feels as though " I wonder how if tonight is going to be the night that the lord tells me."    TSH - Does not take thyroid medication every day. Endorsees feeling cold and weight loss.  Wt Readings from Last 3 Encounters:  12/15/14 84 lb 14.4 oz (38.51 kg)  12/03/13 94 lb (42.638 kg)  04/16/13 78 lb (35.381 kg)      Positive for chills, unintentional weight loss, hearing ,feeling poorly.falls, and depression  ROS negative for unless reported above: fevers, ,feeling poorly, unintentional weight loss, hearing, chest pain, palpitations, leg claudication, struggling to breath,Not feeling congested in the chest, no orthopenia, no cough,no wheezing, normal appetite, no soft tissue swelling, no hemoptysis, melena, hematochezia, hematuria,, loc, si, or thoughts of  self harm.    Past Medical History  Diagnosis Date  . COPD (chronic obstructive pulmonary disease)   . Hypothyroidism   . 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 Hyperlipidemia   . Pulmonary nodule   . 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. Depression   . Cerebrovascular disease   . Positive ANA (antinuclear antibody)   . Diverticulosis   . Esophageal stricture   . Arthritis   . History of pneumonia   . History of stroke     mild oncoming  . Pneumonia     HX of  . Shortness of breath     periodically with COPD flair up  . Internal hemorrhoids   . Rectal ulcer     Past Surgical History  Procedure Laterality Date  . Hiatal hernia repair      x 2  . Cataract extraction      bilateral  . Total abdominal hysterectomy w/ bilateral salpingoophorectomy  1998  . Bladder surgery      tack  . Hemicolectomy  2012    emergent surgery [facility]  . Tonsillectomy    . Colonoscopy  06/22/2011    Procedure: COLONOSCOPY;  Surgeon: Lafayette Dragon, MD;  Location: [facility];  Service: Endoscopy;  Laterality: N/A;  . Gastric varices banding  06/22/2011    Procedure: HEMORRHOID BANDING;  Surgeon: Lafayette Dragon, MD;  Location: [facility];  Service: Endoscopy;  Laterality: N/A;    Family History  Problem Relation Age of Onset  . Colon cancer Mother   . COPD Father   . Heart attack Father   . Stroke Maternal Aunt   . Colon polyps Brother   . Liver disease Maternal Aunt   . Breast cancer Maternal Aunt     History   Social History  . Marital Status: Widowed    Spouse Name: N/A  . Number of Children: 3  . Years of Education: N/A   Occupational History  . RETIRED    Social History Main Topics  . Smoking status: Former Smoker    Quit date: 11/01/2009  . Smokeless tobacco: Never Used  . Alcohol Use: No  . Drug Use: No  . Sexual Activity: Not Currently   Other Topics Concern  . None   Social History Narrative     Current outpatient prescriptions:   .  halobetasol (ULTRAVATE) 0.05 % ointment, Apply topically 2 (two) times daily.  , Disp: , Rfl:  .  levothyroxine (SYNTHROID, LEVOTHROID) 125 MCG tablet, Take 1 tablet (125 mcg total) by mouth daily., Disp: 90 tablet, Rfl: 1 .  temazepam (RESTORIL) 15 MG capsule, TAKE ONE CAPSULE BY MOUTH ONCE DAILY AS NEEDED FOR ANXIETY, Disp: 30 capsule, Rfl: 0 .  Fluticasone-Salmeterol (ADVAIR) 100-50 MCG/DOSE AEPB, Inhale 1 puff into the lungs 2 (two) times daily., Disp: 1 each, Rfl: 3 .  mirtazapine (REMERON) 15 MG tablet, Take 1 tablet (15 mg total) by mouth at bedtime., Disp: 30 tablet, Rfl: 5 .  tiotropium (SPIRIVA HANDIHALER) 18 MCG inhalation capsule, Place 1 capsule (18 mcg total) into inhaler and inhale daily. (Patient not taking: Reported on 12/15/2014), Disp: 30 capsule, Rfl: 12  EXAM:  Filed Vitals:   12/15/14 1022  BP: 102/70  Pulse: 83  Temp: 98.6 F (37 C)    Body mass index is 16.87 kg/(m^2).  GENERAL: vitals reviewed and listed above, alert, oriented, appears well hydrated and in no acute distress. She is malnutrioused and thin.  HEENT: atraumatic, conjunttiva clear, no obvious abnormalities on inspection of external nose and ears.   NECK: Neck is soft and supple without masses, no adenopathy or thyromegaly, trachea midline, no JVD. Normal range of motion.   LUNGS: clear to auscultation bilaterally,slight wheezing in right upper, no rales or rhonchi, good air movement  CV: Regular rate and rhythm, normal S1/S2, no audible murmurs, gallops, or rubs. No carotid bruit and no peripheral edema.   MS: moves all extremities without noticeable abnormality. No edema noted  Abd: soft/nontender/nondistended/normal bowel sounds   Skin: warm and dry, no rash   Extremities: No clubbing, cyanosis, or edema. Capillary refill is WNL. Pulses intact bilaterally in upper and lower extremities.   Neuro: CN II-XII intact, sensation and reflexes normal throughout, 5/5 muscle strength in  bilateral upper and lower extremities. Normal finger to nose. Normal rapid alternating movements.   PSYCH: pleasant and cooperative. She does appear depressed.   ASSESSMENT AND PLAN:  Discussed the following assessment and plan:  Hypothyroidism, unspecified hypothyroidism type - Plan: TSH  Depression - Plan: TSH  Simple chronic bronchitis  Insomnia  Loss of weight -We reviewed the PMH, PSH, FH, SH, Meds and Allergies. -We provided refills for any medications we will prescribe as needed. -We addressed current concerns per orders and patient instructions.  -We have advised patient to follow up per instructions below.   -Patient advised to return or notify a provider immediately  if symptoms worsen or persist or new concerns arise.  Patient Instructions  Please do not take your Restoril at night. Start taking the Remeron before you go to bed.   I will let you know what your TSH shows. START TAKING YOUR MEDICATION EVERY DAY!!!.   Follow up with me in in three weeks.      [employer]

## 2014-12-15 NOTE — Progress Notes (Signed)
Pre visit review using our clinic review tool, if applicable. No additional management support is needed unless otherwise documented below in the visit note. 

## 2014-12-15 NOTE — Assessment & Plan Note (Signed)
Heidi [name] was too expensive. She likes Advair. Will sent this in

## 2014-12-15 NOTE — Assessment & Plan Note (Signed)
Has had multiple deaths in her life this year. She is not leaving the house and is not eating. Will prescribe Remeron 15 mg nightly. She needs to stop her Restoril.   Wt Readings from Last 3 Encounters:  12/15/14 84 lb 14.4 oz (38.51 kg)  12/03/13 94 lb (42.638 kg)  04/16/13 78 lb (35.381 kg)

## 2014-12-15 NOTE — Assessment & Plan Note (Signed)
Do not take Restoril while taking the Remeron.

## 2014-12-23 ENCOUNTER — Telehealth: Payer: Self-pay | Admitting: Adult Health

## 2014-12-23 NOTE — Telephone Encounter (Signed)
Pt states that sleep med is really working! She had the best night sleep for the past 2 nites that she has had in a long time!! But pt would like to have some other kind of pain med that can be called in and that can be taken w/ the new sleep med. Needs something stronger than advil.  [facility] [address], [place]

## 2014-12-23 NOTE — Telephone Encounter (Signed)
Pls advise.  

## 2014-12-23 NOTE — Telephone Encounter (Signed)
Pt states she is having back and shoulder pain up around ribs.  Pt states from where she has been walking so much-constant pain that no one can do anything about.Per pt ankles are going down.  She is using a foot bath and salt.  Pt states she is eating well and Per Tommi Rumps pt can take Ibuprofen 600 mg every 8 hours for pain. Pt states it is only arthritis and the thyroid being out of wack.  Pt states she is taking her thyroid medication daily.  Pt verbalized understanding of Ibuprofen dose.

## 2014-12-23 NOTE — Telephone Encounter (Signed)
She was not on any pain medication. She was told to stop the Restoril, that she was taking for anxiety/sleep. She can take 600mg  of Ibuprfen

## 2015-01-05 ENCOUNTER — Ambulatory Visit (HOSPITAL_COMMUNITY)
Admission: RE | Admit: 2015-01-05 | Discharge: 2015-01-05 | Disposition: A | Discharge status: Home / Self Care | Payer: Medicare Other | Source: Ambulatory Visit | Attending: Adult Health | Admitting: Adult Health

## 2015-01-05 ENCOUNTER — Encounter: Payer: Self-pay | Admitting: Adult Health

## 2015-01-05 ENCOUNTER — Ambulatory Visit (INDEPENDENT_AMBULATORY_CARE_PROVIDER_SITE_OTHER): Payer: Medicare Other | Admitting: Adult Health

## 2015-01-05 VITALS — BP 130/80 | Temp 98.6°F | Ht 59.5 in | Wt 81.5 lb

## 2015-01-05 DIAGNOSIS — R600 Localized edema: Secondary | ICD-10-CM

## 2015-01-05 DIAGNOSIS — J449 Chronic obstructive pulmonary disease, unspecified: Secondary | ICD-10-CM | POA: Diagnosis not present

## 2015-01-05 DIAGNOSIS — R634 Abnormal weight loss: Secondary | ICD-10-CM | POA: Diagnosis not present

## 2015-01-05 DIAGNOSIS — M438X4 Other specified deforming dorsopathies, thoracic region: Secondary | ICD-10-CM | POA: Diagnosis not present

## 2015-01-05 DIAGNOSIS — Z09 Encounter for follow-up examination after completed treatment for conditions other than malignant neoplasm: Secondary | ICD-10-CM

## 2015-01-05 DIAGNOSIS — R0602 Shortness of breath: Secondary | ICD-10-CM | POA: Diagnosis not present

## 2015-01-05 NOTE — Patient Instructions (Signed)
Please follow up at [facility] for your chest xray and Ultrasound. I will let you know of the results.   Follow up with me in 4 weeks regarding your thhyroid and complete physical.   We discussed ways of reducing the fluid in your ankles, including elevating your feet and reducing your salt intake.

## 2015-01-05 NOTE — Progress Notes (Signed)
Subjective:    Patient ID: Heidi [name], female    DOB: [birth year], 74 y.o.   MRN: [id number]  HPI  Patient presents to the office today for follow up. I last saw her on 12/15/2014 when she established care. At that time she had many different problems including insomnia, severely elevated TSH, chronic weight loss.  She endorses that she is sleeping much better while taking the Remeron and she is eating well. She continues to lose weight despite having a better appetite.   She also endorses that she is taking her Synthroid every day.    Today she complains of swelling in her ankles and weight loss. She does not know when the swelling in her ankles began, she sometimes has slight pain and on occasion the swelling goes away on it's own.   Her last three weights in the office have been  Wt Readings from Last 3 Encounters:  01/05/15 81 lb 8 oz (36.968 kg)  12/15/14 84 lb 14.4 oz (38.51 kg)  12/03/13 94 lb (42.638 kg)      Review of Systems  Constitutional: Positive for appetite change (increase) and unexpected weight change (3 pounds since last visit). Negative for fever, activity change and fatigue.  HENT: Negative.   Eyes: Negative.   Respiratory: Negative.   Cardiovascular: Positive for leg swelling.  Gastrointestinal: Negative.   Endocrine: Negative.   Genitourinary: Negative.   Musculoskeletal: Positive for myalgias, back pain, arthralgias and gait problem. Negative for neck pain and neck stiffness.  Skin: Negative.   Allergic/Immunologic: Negative.   Neurological: Negative.   Hematological: Negative.   Psychiatric/Behavioral: Negative.   All other systems reviewed and are negative.  Past Medical History  Diagnosis Date  . COPD (chronic obstructive pulmonary disease)   . Hypothyroidism   . 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 Hyperlipidemia   . Pulmonary nodule   . 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. Depression   . Cerebrovascular disease   .  Positive ANA (antinuclear antibody)   . Diverticulosis   . Esophageal stricture   . Arthritis   . History of pneumonia   . History of stroke     mild oncoming  . Pneumonia     HX of  . Shortness of breath     periodically with COPD flair up  . Internal hemorrhoids   . Rectal ulcer     History   Social History  . Marital Status: Widowed    Spouse Name: N/A  . Number of Children: 3  . Years of Education: N/A   Occupational History  . RETIRED    Social History Main Topics  . Smoking status: Former Smoker    Quit date: 11/01/2009  . Smokeless tobacco: Never Used  . Alcohol Use: No  . Drug Use: No  . Sexual Activity: Not Currently   Other Topics Concern  . Not on file   Social History Narrative    Past Surgical History  Procedure Laterality Date  . Hiatal hernia repair      x 2  . Cataract extraction      bilateral  . Total abdominal hysterectomy w/ bilateral salpingoophorectomy  1998  . Bladder surgery      tack  . Hemicolectomy  2012    emergent surgery [facility]  . Tonsillectomy    . Colonoscopy  06/22/2011    Procedure: COLONOSCOPY;  Surgeon: Lafayette Dragon, MD;  Location: [facility];  Service: Endoscopy;  Laterality: N/A;  . Gastric varices banding  06/22/2011    Procedure: HEMORRHOID BANDING;  Surgeon: Lafayette Dragon, MD;  Location: [facility];  Service: Endoscopy;  Laterality: N/A;    Family History  Problem Relation Age of Onset  . Colon cancer Mother   . COPD Father   . Heart attack Father   . Stroke Maternal Aunt   . Colon polyps Brother   . Liver disease Maternal Aunt   . Breast cancer Maternal Aunt     Allergies  Allergen Reactions  . Penicillins Hives    Current Outpatient Prescriptions on File Prior to Visit  Medication Sig Dispense Refill  . Fluticasone-Salmeterol (ADVAIR) 100-50 MCG/DOSE AEPB Inhale 1 puff into the lungs 2 (two) times daily. 1 each 3  . halobetasol (ULTRAVATE) 0.05 % ointment Apply topically 2 (two) times  daily.      Marland Kitchen levothyroxine (SYNTHROID, LEVOTHROID) 125 MCG tablet Take 1 tablet (125 mcg total) by mouth daily. 90 tablet 1  . mirtazapine (REMERON) 15 MG tablet Take 1 tablet (15 mg total) by mouth at bedtime. 30 tablet 5  . tiotropium (SPIRIVA HANDIHALER) 18 MCG inhalation capsule Place 1 capsule (18 mcg total) into inhaler and inhale daily. 30 capsule 12   No current facility-administered medications on file prior to visit.    BP 130/80 mmHg  Temp(Src) 98.6 F (37 C) (Oral)  Ht 4' 11.5" (1.511 m)  Wt 81 lb 8 oz (36.968 kg)  BMI 16.19 kg/m2       Objective:   Physical Exam  Constitutional: She is oriented to person, place, and time. She appears well-developed. No distress.  thin  HENT:  Head: Normocephalic and atraumatic.  Right Ear: External ear normal.  Left Ear: External ear normal.  Nose: Nose normal.  Mouth/Throat: Oropharynx is clear and moist. No oropharyngeal exudate.  Neck: No thyromegaly present.  Cardiovascular: Normal rate, regular rhythm, normal heart sounds and intact distal pulses.  Exam reveals no gallop and no friction rub.   No murmur heard. Pulmonary/Chest: Effort normal and breath sounds normal. No respiratory distress. She has no wheezes. She has no rales. She exhibits no tenderness.  Musculoskeletal: Normal range of motion. She exhibits edema (moderate bilateral lower extremity swelling) and tenderness.  Lymphadenopathy:    She has no cervical adenopathy.  Neurological: She is alert and oriented to person, place, and time.  Skin: Skin is warm and dry. No rash noted. She is not diaphoretic. No erythema. No pallor.  Psychiatric: She has a normal mood and affect. Her behavior is normal. Judgment and thought content normal.  Nursing note and vitals reviewed.      Assessment & Plan:  1. Follow up - Follow up in 4 weeks for CPE and TSh recheck.   2. Bilateral edema of lower extremity - DG Chest 2 View; Future - US Venous Img Lower Bilateral;  Future - Will follow up regarding xray and [place].  - Reduce sodium in diet - Elevate legs - Watch fluid intake  3. Loss of weight  - CA vx Thyroid vs chronic ataxia.  - DG Chest 2 View; Future - Continue to eat high calorie healthy food.  - Will follow up after imaging is done.

## 2015-01-05 NOTE — Progress Notes (Signed)
Pre visit review using our clinic review tool, if applicable. No additional management support is needed unless otherwise documented below in the visit note. 

## 2015-01-08 ENCOUNTER — Telehealth: Payer: Self-pay | Admitting: Adult Health

## 2015-01-08 NOTE — Addendum Note (Signed)
Addended by: Colleen Can on: 01/08/2015 01:19 PM   Modules accepted: Orders

## 2015-01-08 NOTE — Telephone Encounter (Signed)
Pt call to say she had xray 's done and would like a call back with results

## 2015-01-09 NOTE — Telephone Encounter (Signed)
Called and spoke with pt and pt's daughter in law about results.

## 2015-01-11 ENCOUNTER — Ambulatory Visit (HOSPITAL_COMMUNITY): Payer: Medicare Other | Attending: Cardiovascular Disease

## 2015-01-11 DIAGNOSIS — R600 Localized edema: Secondary | ICD-10-CM | POA: Diagnosis not present

## 2015-01-12 ENCOUNTER — Telehealth: Payer: Self-pay | Admitting: Adult Health

## 2015-01-12 NOTE — Telephone Encounter (Signed)
Attempted to call patient regarding Remeron. No answer, VM left

## 2015-01-14 ENCOUNTER — Telehealth: Payer: Self-pay | Admitting: Adult Health

## 2015-01-14 NOTE — Telephone Encounter (Signed)
Spoke to patient on the phone about Remeron. She will stay on the medication for a little bit.

## 2015-01-28 ENCOUNTER — Ambulatory Visit: Payer: Medicare Other

## 2015-01-28 ENCOUNTER — Inpatient Hospital Stay: Admission: RE | Admit: 2015-01-28 | Payer: Medicare Other | Source: Ambulatory Visit

## 2015-02-09 ENCOUNTER — Ambulatory Visit
Admission: RE | Admit: 2015-02-09 | Discharge: 2015-02-09 | Disposition: A | Discharge status: Home / Self Care | Payer: Medicare Other | Source: Ambulatory Visit | Attending: Adult Health | Admitting: Adult Health

## 2015-02-09 DIAGNOSIS — Z1231 Encounter for screening mammogram for malignant neoplasm of breast: Secondary | ICD-10-CM

## 2015-02-09 DIAGNOSIS — M818 Other osteoporosis without current pathological fracture: Secondary | ICD-10-CM | POA: Diagnosis not present

## 2015-02-09 DIAGNOSIS — Z780 Asymptomatic menopausal state: Secondary | ICD-10-CM

## 2015-02-09 LAB — HM MAMMOGRAPHY

## 2015-02-10 ENCOUNTER — Other Ambulatory Visit: Payer: Self-pay | Admitting: Adult Health

## 2015-02-10 ENCOUNTER — Encounter: Payer: Self-pay | Admitting: Cardiology

## 2015-02-10 DIAGNOSIS — R928 Other abnormal and inconclusive findings on diagnostic imaging of breast: Secondary | ICD-10-CM

## 2015-02-11 ENCOUNTER — Other Ambulatory Visit: Payer: Self-pay | Admitting: Adult Health

## 2015-02-11 MED ORDER — ALENDRONATE SODIUM 70 MG PO TABS: 70.0000 mg | ORAL_TABLET | ORAL | Status: DC

## 2015-02-17 ENCOUNTER — Ambulatory Visit
Admission: RE | Admit: 2015-02-17 | Discharge: 2015-02-17 | Disposition: A | Discharge status: Home / Self Care | Payer: Medicare Other | Source: Ambulatory Visit | Attending: Adult Health | Admitting: Adult Health

## 2015-02-17 ENCOUNTER — Other Ambulatory Visit: Payer: Self-pay | Admitting: Adult Health

## 2015-02-17 ENCOUNTER — Encounter: Payer: Self-pay | Admitting: Adult Health

## 2015-02-17 ENCOUNTER — Ambulatory Visit (INDEPENDENT_AMBULATORY_CARE_PROVIDER_SITE_OTHER): Payer: Medicare Other | Admitting: Adult Health

## 2015-02-17 VITALS — BP 102/68 | Temp 98.1°F | Ht 59.5 in | Wt 76.9 lb

## 2015-02-17 DIAGNOSIS — R921 Mammographic calcification found on diagnostic imaging of breast: Secondary | ICD-10-CM | POA: Diagnosis not present

## 2015-02-17 DIAGNOSIS — R928 Other abnormal and inconclusive findings on diagnostic imaging of breast: Secondary | ICD-10-CM

## 2015-02-17 DIAGNOSIS — E785 Hyperlipidemia, unspecified: Secondary | ICD-10-CM | POA: Diagnosis not present

## 2015-02-17 DIAGNOSIS — E039 Hypothyroidism, unspecified: Secondary | ICD-10-CM | POA: Diagnosis not present

## 2015-02-17 DIAGNOSIS — Z23 Encounter for immunization: Secondary | ICD-10-CM

## 2015-02-17 DIAGNOSIS — Z0000 Encounter for general adult medical examination without abnormal findings: Secondary | ICD-10-CM

## 2015-02-17 DIAGNOSIS — R109 Unspecified abdominal pain: Secondary | ICD-10-CM | POA: Diagnosis not present

## 2015-02-17 DIAGNOSIS — R197 Diarrhea, unspecified: Secondary | ICD-10-CM

## 2015-02-17 DIAGNOSIS — R8290 Unspecified abnormal findings in urine: Secondary | ICD-10-CM | POA: Diagnosis not present

## 2015-02-17 DIAGNOSIS — R634 Abnormal weight loss: Secondary | ICD-10-CM

## 2015-02-17 DIAGNOSIS — Z87891 Personal history of nicotine dependence: Secondary | ICD-10-CM | POA: Diagnosis not present

## 2015-02-17 LAB — BASIC METABOLIC PANEL
BUN: 10 mg/dL (ref 6–23)
CALCIUM: 9.8 mg/dL (ref 8.4–10.5)
CHLORIDE: 93 meq/L — AB (ref 96–112)
CO2: 35 mEq/L — ABNORMAL HIGH (ref 19–32)
Creatinine, Ser: 0.53 mg/dL (ref 0.40–1.20)
GFR: 120.03 mL/min (ref >60.00)
Glucose, Bld: 79 mg/dL (ref 70–99)
Potassium: 3.9 mEq/L (ref 3.5–5.1)
Sodium: 137 mEq/L (ref 135–145)

## 2015-02-17 LAB — CALCIUM: CALCIUM: 9.8 mg/dL (ref 8.4–10.5)

## 2015-02-17 LAB — CBC WITH DIFFERENTIAL/PLATELET
BASOS ABS: 0 10*3/uL (ref 0.0–0.1)
Basophils Relative: 0 % (ref 0.0–3.0)
Eosinophils Absolute: 0 10*3/uL (ref 0.0–0.7)
Eosinophils Relative: 0 % (ref 0.0–5.0)
HCT: 44.9 % (ref 36.0–46.0)
Hemoglobin: 15 g/dL (ref 12.0–15.0)
LYMPHS PCT: 9.8 % — AB (ref 12.0–46.0)
Lymphs Abs: 1.2 10*3/uL (ref 0.7–4.0)
MCHC: 33.4 g/dL (ref 30.0–36.0)
MCV: 94 fl (ref 78.0–100.0)
MONO ABS: 0.5 10*3/uL (ref 0.1–1.0)
Monocytes Relative: 4.5 % (ref 3.0–12.0)
Neutro Abs: 10.3 10*3/uL — ABNORMAL HIGH (ref 1.4–7.7)
Neutrophils Relative %: 85.7 % — ABNORMAL HIGH (ref 43.0–77.0)
PLATELETS: 239 10*3/uL (ref 150.0–400.0)
RBC: 4.78 Mil/uL (ref 3.87–5.11)
RDW: 14.8 % (ref 11.5–15.5)
WBC: 14.1 10*3/uL — ABNORMAL HIGH (ref 4.0–10.5)

## 2015-02-17 LAB — HEPATIC FUNCTION PANEL
ALBUMIN: 4 g/dL (ref 3.5–5.2)
ALK PHOS: 76 U/L (ref 39–117)
ALT: 12 U/L (ref 0–35)
AST: 17 U/L (ref 0–37)
BILIRUBIN DIRECT: 0.1 mg/dL (ref 0.0–0.3)
TOTAL PROTEIN: 7.5 g/dL (ref 6.0–8.3)
Total Bilirubin: 0.5 mg/dL (ref 0.2–1.2)

## 2015-02-17 LAB — POCT URINALYSIS DIPSTICK
BILIRUBIN UA: NEGATIVE
GLUCOSE UA: NEGATIVE
KETONES UA: NEGATIVE
NITRITE UA: NEGATIVE
SPEC GRAV UA: 1.015
Urobilinogen, UA: 1
pH, UA: 6

## 2015-02-17 LAB — LIPID PANEL
Cholesterol: 188 mg/dL (ref 0–200)
HDL: 65.1 mg/dL (ref >39.00)
LDL CALC: 103 mg/dL — AB (ref 0–99)
NonHDL: 123.09
Total CHOL/HDL Ratio: 3
Triglycerides: 101 mg/dL (ref 0.0–149.0)
VLDL: 20.2 mg/dL (ref 0.0–40.0)

## 2015-02-17 LAB — TSH: TSH: 32.19 u[IU]/mL — ABNORMAL HIGH (ref 0.35–4.50)

## 2015-02-17 MED ORDER — LOPERAMIDE HCL 2 MG PO TABS: 2.0000 mg | ORAL_TABLET | Freq: Two times a day (BID) | ORAL | Status: DC | PRN

## 2015-02-17 NOTE — Addendum Note (Signed)
Addended by: Apolinar Junes on: 02/17/2015 05:43 PM   Modules accepted: Orders

## 2015-02-17 NOTE — Progress Notes (Addendum)
Subjective:  Patient presents today for their annual wellness visit. She is a pleasant 74 year old female with  has a past medical history of COPD (chronic obstructive pulmonary disease); Hypothyroidism; adenomatous colonic polyps; Hyperlipidemia; Pulmonary nodule; Sigmoid volvulus; Depression; Cerebrovascular disease; Positive ANA (antinuclear antibody); Diverticulosis; Esophageal stricture; Arthritis; History of pneumonia; History of stroke; Pneumonia; Shortness of breath; Internal hemorrhoids; and Rectal ulcer. Her daughter is present with her for her exam.     Weight loss - She is taking 15 mg Remeron daily. This helps her sleep and she has a good appetite. Endorses that 45 minutes after she eats she has diarrhea.  Wt Readings from Last 3 Encounters:  02/17/15 76 lb 14.4 oz (34.882 kg)  01/05/15 81 lb 8 oz (36.968 kg)  12/15/14 84 lb 14.4 oz (38.51 kg)   Denies any nausea or vomiting. Her last colonoscopy was in 2012. She sees Dr. Olevia Perches but has not seen her in many years.    Depression - Endorses that her depression is much improved. The only time she becomes depressed is when she argues with her daughters.   Hearing Issues - She does endorses trouble hearing and endorses having a ringing in her ears. This has been an ongoing problem for " many years." She does not want to see any one about her hearing.   She does have advanced directives and a living will.  Preventive Screening-Counseling & Management  Smoking Status: Former Smoker [employer] Smoking status: Yes.   Risk Factors Regular exercise: No Diet: Does not follow a specific diet.   Fall Risk:Yes  Cardiac risk factors:  advanced age (older than 88 for men, 28 for women) Yes Hyperlipidemia Yes No diabetes. No Family History: Father had heart attack and stroke.   Depression Screen None. PHQ2 0  Activities of Daily Living Independent ADLs and IADLs  She does need assistance with driving, showering, and  getting dressed and managing money.   Hearing Difficulties: Yes. Does not want to see anyone about this issue due to being on a fixed income.   Cognitive Testing No reported trouble.   Normal 3 word recall  List the Names of Other Physician/Practitioners you currently use: 1.Dr. Ferrel Logan 2. Optho Dr. Bethanne Ginger in 2010.   Immunization History  Administered Date(s) Administered  . H1N1 06/30/2008  . Influenza Split 04/18/2012  . Influenza Whole 06/10/2007, 04/14/2010, 04/18/2012  . Influenza,inj,Quad PF,36+ Mos 04/16/2013  . Pneumococcal Polysaccharide-23 05/17/2006, 04/16/2013  . Td 03/29/1999  . Tdap 04/16/2013   Required Immunizations needed today PCV 13  Screening tests- up to date Health Maintenance Due  Topic Date Due  . ZOSTAVAX  09/05/2001  . PNA vac Low Risk Adult (2 of 2 - PCV13) 04/16/2014  . INFLUENZA VACCINE  02/15/2015    ROS-   She reports that she is feeling a lot better since getting back on all her medications. She has more energy and is able to be more active. Family reports a significant change in her energy level and demeanor.   The following were reviewed and entered/updated in epic: Past Medical History  Diagnosis Date  . COPD (chronic obstructive pulmonary disease)   . Hypothyroidism   . 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 Hyperlipidemia   . Pulmonary nodule   . 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. Depression   . Cerebrovascular disease   . Positive ANA (antinuclear antibody)   . Diverticulosis   . Esophageal  stricture   . Arthritis   . History of pneumonia   . History of stroke     mild oncoming  . Pneumonia     HX of  . Shortness of breath     periodically with COPD flair up  . Internal hemorrhoids   . Rectal ulcer    Patient Active Problem List   Diagnosis Date Noted  . Loss of weight 12/15/2014  . PSORIASIS 12/28/2009  . Depression 08/20/2009  . HYPERLIPIDEMIA 08/04/2008  . CAROTID BRUIT, LEFT  06/11/2008  . Insomnia 01/08/2008  . COLONIC POLYPS, HX OF 06/10/2007  . Hypothyroidism 02/04/2007  . COPD (chronic obstructive pulmonary disease) 02/04/2007   Past Surgical History  Procedure Laterality Date  . Hiatal hernia repair      x 2  . Cataract extraction      bilateral  . Total abdominal hysterectomy w/ bilateral salpingoophorectomy  1998  . Bladder surgery      tack  . Hemicolectomy  2012    emergent surgery [place] [facility]  . Tonsillectomy    . Colonoscopy  06/22/2011    Procedure: COLONOSCOPY;  Surgeon: Lafayette Dragon, MD;  Location: [facility];  Service: Endoscopy;  Laterality: N/A;  . Gastric varices banding  06/22/2011    Procedure: HEMORRHOID BANDING;  Surgeon: Lafayette Dragon, MD;  Location: [facility];  Service: Endoscopy;  Laterality: N/A;    Family History  Problem Relation Age of Onset  . Colon cancer Mother   . COPD Father   . Heart attack Father   . Stroke Maternal Aunt   . Colon polyps Brother   . Liver disease Maternal Aunt   . Breast cancer Maternal Aunt     Medications- reviewed and updated Current Outpatient Prescriptions  Medication Sig Dispense Refill  . alendronate (FOSAMAX) 70 MG tablet Take 1 tablet (70 mg total) by mouth every 7 (seven) days. Take with a full glass of water on an empty stomach. 4 tablet 11  . Fluticasone-Salmeterol (ADVAIR) 100-50 MCG/DOSE AEPB Inhale 1 puff into the lungs 2 (two) times daily. 1 each 3  . halobetasol (ULTRAVATE) 0.05 % ointment Apply topically 2 (two) times daily.      Marland Kitchen levothyroxine (SYNTHROID, LEVOTHROID) 125 MCG tablet Take 1 tablet (125 mcg total) by mouth daily. 90 tablet 1  . mirtazapine (REMERON) 15 MG tablet Take 1 tablet (15 mg total) by mouth at bedtime. 30 tablet 5  . tiotropium (SPIRIVA HANDIHALER) 18 MCG inhalation capsule Place 1 capsule (18 mcg total) into inhaler and inhale daily. (Patient not taking: Reported on 02/17/2015) 30 capsule 12   No current facility-administered medications for  this visit.    Allergies-reviewed and updated Allergies  Allergen Reactions  . Penicillins Hives    History   Social History  . Marital Status: Widowed    Spouse Name: N/A  . Number of Children: 3  . Years of Education: N/A   Occupational History  . RETIRED    Social History Main Topics  . Smoking status: Former Smoker    Quit date: 11/01/2009  . Smokeless tobacco: Never Used  . Alcohol Use: No  . Drug Use: No  . Sexual Activity: Not Currently   Other Topics Concern  . None   Social History Narrative    Objective: Temp(Src) 98.1 F (36.7 C) (Oral)  Ht 4' 11.5" (1.511 m)  Wt 76 lb 14.4 oz (34.882 kg)  BMI 15.28 kg/m2   Assessment/Plan:  GENERAL: vitals reviewed and listed above,  alert, oriented, appears well hydrated and in no acute distress. She is malnutrioused and thin but has more energy, is more talkative, her hair and makeup are done and she appears in much better spirits then the last time I had seen her.   HEENT: atraumatic, conjunttiva clear, no obvious abnormalities on inspection of external nose and ears. TM's visualized with no signs of infection or effusion.   NECK: Neck is soft and supple without masses, no adenopathy or thyromegaly, trachea midline, no JVD. Normal range of motion.   LUNGS: clear to auscultation bilaterally,slight wheezing in right upper, no rales or rhonchi, good air movement  CV: Regular rate and rhythm, normal S1/S2, no audible murmurs, gallops, or rubs. No carotid bruit and no peripheral edema.   MS: moves all extremities without noticeable abnormality. No edema noted. She has better posture than in previous exams.   Abd: soft/nontender/nondistended/normal bowel sounds   Rectal Exam: No occult blood  Breast Exam: WNL. No masses, lumps, dimpling, or discharge.   Skin: warm and dry, no rash   Extremities: No clubbing, cyanosis, or edema. Capillary refill is WNL. Pulses intact bilaterally in upper and lower extremities.     Neuro: CN II-XII intact, sensation and reflexes normal throughout, 5/5 muscle strength in bilateral upper and lower extremities. Normal finger to nose.   PSYCH: pleasant and cooperative. She does appear depressed.    Greater than 30 minutes was spent with this patient Return precautions advised.   Assessment and Plan 1. Routine general medical examination at a health care facility - Follow up in 2 months for recheck  - Follow up as needed - Continue to eat high calorie foods.  - Will follow up with labs  2. Unintentional weight loss  - Basic metabolic panel - Hepatic function panel - Lipid panel - POCT urinalysis dipstick - Ambulatory referral to Gastroenterology - TSH - CBC with Differential/Platelet - HIV antibody - Hep C Antibody - Calcium - Ambulatory referral to Gastroenterology - loperamide (IMODIUM A-D) 2 MG tablet; Take 1 tablet (2 mg total) by mouth 2 (two) times daily as needed for diarrhea or loose stools.  Dispense: 30 tablet; Refill: 3   3. Hyperlipidemia  - Basic metabolic panel - EKG 70-YFVC - Hepatic function panel - Lipid panel - POCT urinalysis dipstick - TSH - CBC with Differential/Platelet  4. Hypothyroidism, unspecified hypothyroidism type  - Basic metabolic panel - EKG 94-WHQP - TSH - CBC with Differential/Platelet  5. Abnormal urinalysis  - Culture, Urine  These are the goals we discussed: Goals    . Gain weight    . Prevent Falls       This is a list of the screening recommended for you and due dates:  Health Maintenance  Topic Date Due  . Shingles Vaccine  09/05/2001  . Pneumonia vaccines (2 of 2 - PCV13) 04/16/2014  . Flu Shot  02/15/2015  . Mammogram  02/08/2017  . Colon Cancer Screening  06/21/2021  . Tetanus Vaccine  04/17/2023  . DEXA scan (bone density measurement)  Completed

## 2015-02-17 NOTE — Patient Instructions (Signed)
It was great seeing you again today!   I will follow up with you today about the labs and the medications as well as the medicare bathtub.   Follow up with me in 2 months or sooner if needed.

## 2015-02-18 ENCOUNTER — Other Ambulatory Visit (INDEPENDENT_AMBULATORY_CARE_PROVIDER_SITE_OTHER): Payer: Medicare Other

## 2015-02-18 ENCOUNTER — Other Ambulatory Visit: Payer: Self-pay | Admitting: Adult Health

## 2015-02-18 DIAGNOSIS — R634 Abnormal weight loss: Secondary | ICD-10-CM

## 2015-02-18 DIAGNOSIS — Z87891 Personal history of nicotine dependence: Secondary | ICD-10-CM

## 2015-02-18 LAB — HEPATITIS C ANTIBODY: HCV Ab: NEGATIVE

## 2015-02-18 LAB — C-REACTIVE PROTEIN: CRP: 32.1 mg/dL — ABNORMAL HIGH (ref 0.5–20.0)

## 2015-02-18 LAB — HIV ANTIBODY (ROUTINE TESTING W REFLEX): HIV: NONREACTIVE

## 2015-02-18 MED ORDER — LEVOTHYROXINE SODIUM 137 MCG PO TABS: 125.0000 ug | ORAL_TABLET | Freq: Every day | ORAL | Status: DC

## 2015-02-19 ENCOUNTER — Other Ambulatory Visit: Payer: Self-pay | Admitting: Adult Health

## 2015-02-19 ENCOUNTER — Telehealth: Payer: Self-pay | Admitting: Adult Health

## 2015-02-19 DIAGNOSIS — D72829 Elevated white blood cell count, unspecified: Secondary | ICD-10-CM

## 2015-02-19 DIAGNOSIS — R8290 Unspecified abnormal findings in urine: Secondary | ICD-10-CM

## 2015-02-19 LAB — ANA: Anti Nuclear Antibody(ANA): NEGATIVE

## 2015-02-19 NOTE — Telephone Encounter (Signed)
Spoke to patient on the phone and informed her of her labs. She is not currently having any urinary complaints. She is agreeable to come into the lab Monday for repeat CBC and UA.

## 2015-02-20 LAB — URINE CULTURE: Colony Count: 70000

## 2015-02-22 ENCOUNTER — Ambulatory Visit (INDEPENDENT_AMBULATORY_CARE_PROVIDER_SITE_OTHER)
Admission: RE | Admit: 2015-02-22 | Discharge: 2015-02-22 | Disposition: A | Discharge status: Home / Self Care | Payer: Medicare Other | Source: Ambulatory Visit | Attending: Adult Health | Admitting: Adult Health

## 2015-02-22 ENCOUNTER — Other Ambulatory Visit (INDEPENDENT_AMBULATORY_CARE_PROVIDER_SITE_OTHER): Payer: Medicare Other

## 2015-02-22 DIAGNOSIS — R634 Abnormal weight loss: Secondary | ICD-10-CM

## 2015-02-22 DIAGNOSIS — I700 Atherosclerosis of aorta: Secondary | ICD-10-CM | POA: Diagnosis not present

## 2015-02-22 DIAGNOSIS — J90 Pleural effusion, not elsewhere classified: Secondary | ICD-10-CM | POA: Diagnosis not present

## 2015-02-22 DIAGNOSIS — J432 Centrilobular emphysema: Secondary | ICD-10-CM | POA: Diagnosis not present

## 2015-02-22 DIAGNOSIS — R8290 Unspecified abnormal findings in urine: Secondary | ICD-10-CM | POA: Diagnosis not present

## 2015-02-22 DIAGNOSIS — I517 Cardiomegaly: Secondary | ICD-10-CM | POA: Diagnosis not present

## 2015-02-22 DIAGNOSIS — R8299 Other abnormal findings in urine: Secondary | ICD-10-CM | POA: Diagnosis not present

## 2015-02-22 DIAGNOSIS — D72829 Elevated white blood cell count, unspecified: Secondary | ICD-10-CM | POA: Diagnosis not present

## 2015-02-22 DIAGNOSIS — J449 Chronic obstructive pulmonary disease, unspecified: Secondary | ICD-10-CM

## 2015-02-22 DIAGNOSIS — Z87891 Personal history of nicotine dependence: Secondary | ICD-10-CM | POA: Diagnosis not present

## 2015-02-22 LAB — CBC WITH DIFFERENTIAL/PLATELET
Basophils Absolute: 0 10*3/uL (ref 0.0–0.1)
Basophils Relative: 0.3 % (ref 0.0–3.0)
EOS ABS: 0 10*3/uL (ref 0.0–0.7)
EOS PCT: 0.2 % (ref 0.0–5.0)
HEMATOCRIT: 40.9 % (ref 36.0–46.0)
Hemoglobin: 13.4 g/dL (ref 12.0–15.0)
Lymphocytes Relative: 14.6 % (ref 12.0–46.0)
Lymphs Abs: 1.1 10*3/uL (ref 0.7–4.0)
MCHC: 32.7 g/dL (ref 30.0–36.0)
MCV: 94.2 fl (ref 78.0–100.0)
MONOS PCT: 5.7 % (ref 3.0–12.0)
Monocytes Absolute: 0.4 10*3/uL (ref 0.1–1.0)
Neutro Abs: 6 10*3/uL (ref 1.4–7.7)
Neutrophils Relative %: 79.2 % — ABNORMAL HIGH (ref 43.0–77.0)
PLATELETS: 341 10*3/uL (ref 150.0–400.0)
RBC: 4.34 Mil/uL (ref 3.87–5.11)
RDW: 15 % (ref 11.5–15.5)
WBC: 7.6 10*3/uL (ref 4.0–10.5)

## 2015-02-22 LAB — POCT URINALYSIS DIPSTICK
Bilirubin, UA: NEGATIVE
Glucose, UA: NEGATIVE
Ketones, UA: NEGATIVE
Nitrite, UA: NEGATIVE
PH UA: 8
Protein, UA: NEGATIVE
RBC UA: NEGATIVE
Spec Grav, UA: 1.015
Urobilinogen, UA: 2

## 2015-02-23 ENCOUNTER — Other Ambulatory Visit: Payer: Self-pay | Admitting: Adult Health

## 2015-02-23 ENCOUNTER — Telehealth: Payer: Self-pay | Admitting: Adult Health

## 2015-02-23 DIAGNOSIS — J439 Emphysema, unspecified: Secondary | ICD-10-CM

## 2015-02-23 NOTE — Telephone Encounter (Signed)
Spoke to patient and informed her of CT results and lab results. Referral to pulm sent due to two new 4-5 mm pulmonary nodules

## 2015-02-24 LAB — URINE CULTURE
Colony Count: NO GROWTH
Organism ID, Bacteria: NO GROWTH

## 2015-03-11 ENCOUNTER — Institutional Professional Consult (permissible substitution): Payer: Medicare Other | Admitting: Pulmonary Disease

## 2015-03-23 ENCOUNTER — Encounter: Payer: Self-pay | Admitting: Internal Medicine

## 2015-04-01 ENCOUNTER — Institutional Professional Consult (permissible substitution): Payer: Medicare Other | Admitting: Pulmonary Disease

## 2015-04-19 ENCOUNTER — Emergency Department (HOSPITAL_COMMUNITY): Payer: Medicare Other

## 2015-04-19 ENCOUNTER — Inpatient Hospital Stay (HOSPITAL_COMMUNITY): Payer: Medicare Other

## 2015-04-19 ENCOUNTER — Encounter (HOSPITAL_COMMUNITY): Payer: Self-pay

## 2015-04-19 ENCOUNTER — Inpatient Hospital Stay (HOSPITAL_COMMUNITY)
Admission: EM | Admit: 2015-04-19 | Discharge: 2015-04-27 | DRG: 871 | Disposition: A | Discharge status: Hospice | Payer: Medicare Other | Attending: Internal Medicine | Admitting: Internal Medicine

## 2015-04-19 ENCOUNTER — Ambulatory Visit: Payer: Medicare Other | Admitting: Adult Health

## 2015-04-19 DIAGNOSIS — J9602 Acute respiratory failure with hypercapnia: Secondary | ICD-10-CM | POA: Diagnosis not present

## 2015-04-19 DIAGNOSIS — I679 Cerebrovascular disease, unspecified: Secondary | ICD-10-CM | POA: Diagnosis present

## 2015-04-19 DIAGNOSIS — B370 Candidal stomatitis: Secondary | ICD-10-CM | POA: Diagnosis present

## 2015-04-19 DIAGNOSIS — R0603 Acute respiratory distress: Secondary | ICD-10-CM

## 2015-04-19 DIAGNOSIS — J441 Chronic obstructive pulmonary disease with (acute) exacerbation: Secondary | ICD-10-CM

## 2015-04-19 DIAGNOSIS — I509 Heart failure, unspecified: Secondary | ICD-10-CM | POA: Diagnosis not present

## 2015-04-19 DIAGNOSIS — J440 Chronic obstructive pulmonary disease with acute lower respiratory infection: Secondary | ICD-10-CM | POA: Diagnosis present

## 2015-04-19 DIAGNOSIS — J9690 Respiratory failure, unspecified, unspecified whether with hypoxia or hypercapnia: Secondary | ICD-10-CM

## 2015-04-19 DIAGNOSIS — M810 Age-related osteoporosis without current pathological fracture: Secondary | ICD-10-CM | POA: Diagnosis present

## 2015-04-19 DIAGNOSIS — R05 Cough: Secondary | ICD-10-CM | POA: Diagnosis not present

## 2015-04-19 DIAGNOSIS — Z8673 Personal history of transient ischemic attack (TIA), and cerebral infarction without residual deficits: Secondary | ICD-10-CM

## 2015-04-19 DIAGNOSIS — I5033 Acute on chronic diastolic (congestive) heart failure: Secondary | ICD-10-CM | POA: Diagnosis present

## 2015-04-19 DIAGNOSIS — Z880 Allergy status to penicillin: Secondary | ICD-10-CM | POA: Diagnosis not present

## 2015-04-19 DIAGNOSIS — I471 Supraventricular tachycardia: Secondary | ICD-10-CM | POA: Diagnosis present

## 2015-04-19 DIAGNOSIS — Z66 Do not resuscitate: Secondary | ICD-10-CM

## 2015-04-19 DIAGNOSIS — Z515 Encounter for palliative care: Secondary | ICD-10-CM | POA: Diagnosis not present

## 2015-04-19 DIAGNOSIS — Z8249 Family history of ischemic heart disease and other diseases of the circulatory system: Secondary | ICD-10-CM

## 2015-04-19 DIAGNOSIS — E039 Hypothyroidism, unspecified: Secondary | ICD-10-CM | POA: Diagnosis present

## 2015-04-19 DIAGNOSIS — Z9119 Patient's noncompliance with other medical treatment and regimen: Secondary | ICD-10-CM

## 2015-04-19 DIAGNOSIS — Z803 Family history of malignant neoplasm of breast: Secondary | ICD-10-CM | POA: Diagnosis not present

## 2015-04-19 DIAGNOSIS — Z90710 Acquired absence of both cervix and uterus: Secondary | ICD-10-CM | POA: Diagnosis not present

## 2015-04-19 DIAGNOSIS — Z800 Family history of malignant neoplasm of digestive organs: Secondary | ICD-10-CM | POA: Diagnosis not present

## 2015-04-19 DIAGNOSIS — Z79899 Other long term (current) drug therapy: Secondary | ICD-10-CM

## 2015-04-19 DIAGNOSIS — Z825 Family history of asthma and other chronic lower respiratory diseases: Secondary | ICD-10-CM

## 2015-04-19 DIAGNOSIS — J90 Pleural effusion, not elsewhere classified: Secondary | ICD-10-CM | POA: Diagnosis not present

## 2015-04-19 DIAGNOSIS — Z720 Tobacco use: Secondary | ICD-10-CM | POA: Diagnosis not present

## 2015-04-19 DIAGNOSIS — R197 Diarrhea, unspecified: Secondary | ICD-10-CM | POA: Diagnosis present

## 2015-04-19 DIAGNOSIS — R008 Other abnormalities of heart beat: Secondary | ICD-10-CM | POA: Diagnosis not present

## 2015-04-19 DIAGNOSIS — G9340 Encephalopathy, unspecified: Secondary | ICD-10-CM | POA: Diagnosis present

## 2015-04-19 DIAGNOSIS — J209 Acute bronchitis, unspecified: Secondary | ICD-10-CM | POA: Diagnosis not present

## 2015-04-19 DIAGNOSIS — R918 Other nonspecific abnormal finding of lung field: Secondary | ICD-10-CM | POA: Diagnosis present

## 2015-04-19 DIAGNOSIS — Z8639 Personal history of other endocrine, nutritional and metabolic disease: Secondary | ICD-10-CM | POA: Diagnosis not present

## 2015-04-19 DIAGNOSIS — Z823 Family history of stroke: Secondary | ICD-10-CM

## 2015-04-19 DIAGNOSIS — Z681 Body mass index (BMI) 19 or less, adult: Secondary | ICD-10-CM | POA: Diagnosis not present

## 2015-04-19 DIAGNOSIS — J9622 Acute and chronic respiratory failure with hypercapnia: Secondary | ICD-10-CM | POA: Diagnosis not present

## 2015-04-19 DIAGNOSIS — Z86010 Personal history of colonic polyps: Secondary | ICD-10-CM

## 2015-04-19 DIAGNOSIS — M1990 Unspecified osteoarthritis, unspecified site: Secondary | ICD-10-CM | POA: Diagnosis present

## 2015-04-19 DIAGNOSIS — Z8701 Personal history of pneumonia (recurrent): Secondary | ICD-10-CM

## 2015-04-19 DIAGNOSIS — J189 Pneumonia, unspecified organism: Secondary | ICD-10-CM | POA: Diagnosis not present

## 2015-04-19 DIAGNOSIS — I248 Other forms of acute ischemic heart disease: Secondary | ICD-10-CM | POA: Diagnosis present

## 2015-04-19 DIAGNOSIS — E785 Hyperlipidemia, unspecified: Secondary | ICD-10-CM | POA: Diagnosis present

## 2015-04-19 DIAGNOSIS — R0902 Hypoxemia: Secondary | ICD-10-CM

## 2015-04-19 DIAGNOSIS — Z8739 Personal history of other diseases of the musculoskeletal system and connective tissue: Secondary | ICD-10-CM | POA: Diagnosis not present

## 2015-04-19 DIAGNOSIS — F17210 Nicotine dependence, cigarettes, uncomplicated: Secondary | ICD-10-CM | POA: Diagnosis present

## 2015-04-19 DIAGNOSIS — R0600 Dyspnea, unspecified: Secondary | ICD-10-CM

## 2015-04-19 DIAGNOSIS — J9601 Acute respiratory failure with hypoxia: Secondary | ICD-10-CM | POA: Diagnosis present

## 2015-04-19 DIAGNOSIS — R7989 Other specified abnormal findings of blood chemistry: Secondary | ICD-10-CM

## 2015-04-19 DIAGNOSIS — Z9889 Other specified postprocedural states: Secondary | ICD-10-CM

## 2015-04-19 DIAGNOSIS — R059 Cough, unspecified: Secondary | ICD-10-CM

## 2015-04-19 DIAGNOSIS — E43 Unspecified severe protein-calorie malnutrition: Secondary | ICD-10-CM | POA: Diagnosis present

## 2015-04-19 DIAGNOSIS — R64 Cachexia: Secondary | ICD-10-CM | POA: Diagnosis present

## 2015-04-19 DIAGNOSIS — F329 Major depressive disorder, single episode, unspecified: Secondary | ICD-10-CM | POA: Diagnosis present

## 2015-04-19 DIAGNOSIS — E860 Dehydration: Secondary | ICD-10-CM | POA: Diagnosis present

## 2015-04-19 DIAGNOSIS — J948 Other specified pleural conditions: Secondary | ICD-10-CM | POA: Diagnosis not present

## 2015-04-19 DIAGNOSIS — E038 Other specified hypothyroidism: Secondary | ICD-10-CM

## 2015-04-19 DIAGNOSIS — K5900 Constipation, unspecified: Secondary | ICD-10-CM | POA: Diagnosis not present

## 2015-04-19 DIAGNOSIS — E871 Hypo-osmolality and hyponatremia: Secondary | ICD-10-CM | POA: Diagnosis present

## 2015-04-19 DIAGNOSIS — A419 Sepsis, unspecified organism: Secondary | ICD-10-CM | POA: Diagnosis not present

## 2015-04-19 DIAGNOSIS — R0689 Other abnormalities of breathing: Secondary | ICD-10-CM

## 2015-04-19 DIAGNOSIS — I4719 Other supraventricular tachycardia: Secondary | ICD-10-CM

## 2015-04-19 DIAGNOSIS — J9621 Acute and chronic respiratory failure with hypoxia: Secondary | ICD-10-CM | POA: Diagnosis present

## 2015-04-19 DIAGNOSIS — A021 Salmonella sepsis: Secondary | ICD-10-CM | POA: Diagnosis not present

## 2015-04-19 DIAGNOSIS — R0602 Shortness of breath: Secondary | ICD-10-CM | POA: Diagnosis not present

## 2015-04-19 DIAGNOSIS — R778 Other specified abnormalities of plasma proteins: Secondary | ICD-10-CM

## 2015-04-19 LAB — CREATININE, SERUM
CREATININE: 0.71 mg/dL (ref 0.44–1.00)
GFR calc Af Amer: >60 mL/min (ref >60)
GFR calc non Af Amer: >60 mL/min (ref >60)

## 2015-04-19 LAB — COMPREHENSIVE METABOLIC PANEL
ALBUMIN: 3 g/dL — AB (ref 3.5–5.0)
ALT: 10 U/L — ABNORMAL LOW (ref 14–54)
AST: 21 U/L (ref 15–41)
Alkaline Phosphatase: 92 U/L (ref 38–126)
Anion gap: 10 (ref 5–15)
BUN: 13 mg/dL (ref 6–20)
CHLORIDE: 80 mmol/L — AB (ref 101–111)
CO2: 35 mmol/L — AB (ref 22–32)
Calcium: 8.4 mg/dL — ABNORMAL LOW (ref 8.9–10.3)
Creatinine, Ser: 0.53 mg/dL (ref 0.44–1.00)
GFR calc Af Amer: >60 mL/min (ref >60)
Glucose, Bld: 143 mg/dL — ABNORMAL HIGH (ref 65–99)
POTASSIUM: 4.4 mmol/L (ref 3.5–5.1)
SODIUM: 125 mmol/L — AB (ref 135–145)
Total Bilirubin: 0.7 mg/dL (ref 0.3–1.2)
Total Protein: 6.4 g/dL — ABNORMAL LOW (ref 6.5–8.1)

## 2015-04-19 LAB — CBC WITH DIFFERENTIAL/PLATELET
BASOS ABS: 0 10*3/uL (ref 0.0–0.1)
BASOS PCT: 0 %
EOS ABS: 0 10*3/uL (ref 0.0–0.7)
EOS PCT: 0 %
HCT: 46 % (ref 36.0–46.0)
Hemoglobin: 15.1 g/dL — ABNORMAL HIGH (ref 12.0–15.0)
Lymphocytes Relative: 3 %
Lymphs Abs: 0.5 10*3/uL — ABNORMAL LOW (ref 0.7–4.0)
MCH: 29.5 pg (ref 26.0–34.0)
MCHC: 32.8 g/dL (ref 30.0–36.0)
MCV: 89.8 fL (ref 78.0–100.0)
MONO ABS: 0.5 10*3/uL (ref 0.1–1.0)
Monocytes Relative: 3 %
Neutro Abs: 14.2 10*3/uL — ABNORMAL HIGH (ref 1.7–7.7)
Neutrophils Relative %: 94 %
PLATELETS: 244 10*3/uL (ref 150–400)
RBC: 5.12 MIL/uL — AB (ref 3.87–5.11)
RDW: 15.7 % — AB (ref 11.5–15.5)
WBC: 15.2 10*3/uL — AB (ref 4.0–10.5)

## 2015-04-19 LAB — BLOOD GAS, ARTERIAL
ACID-BASE EXCESS: 8.3 mmol/L — AB (ref 0.0–2.0)
Acid-Base Excess: 8 mmol/L — ABNORMAL HIGH (ref 0.0–2.0)
Acid-Base Excess: 7.2 mmol/L — ABNORMAL HIGH (ref 0.0–2.0)
BICARBONATE: 39 meq/L — AB (ref 20.0–24.0)
Bicarbonate: 37.7 mEq/L — ABNORMAL HIGH (ref 20.0–24.0)
Bicarbonate: 37.1 mEq/L — ABNORMAL HIGH (ref 20.0–24.0)
DRAWN BY: 295031
DRAWN BY: 295031
DRAWN BY: 441261
Delivery systems: POSITIVE
EXPIRATORY PAP: 8
FIO2: 0.35
FIO2: 0.35
Inspiratory PAP: 20
O2 CONTENT: 3 L/min
O2 SAT: 89.3 %
O2 SAT: 92.4 %
O2 Saturation: 86.7 %
PATIENT TEMPERATURE: 98.6
PATIENT TEMPERATURE: 98.6
PCO2 ART: 86.6 mmHg — AB (ref 35.0–45.0)
PCO2 ART: 84.7 mmHg — AB (ref 35.0–45.0)
PEEP: 7 cmH2O
PEEP: 8 cmH2O
PH ART: 7.275 — AB (ref 7.350–7.450)
PH ART: 7.271 — AB (ref 7.350–7.450)
PH ART: 7.324 — AB (ref 7.350–7.450)
PO2 ART: 67.6 mmHg — AB (ref 80.0–100.0)
PO2 ART: 73.3 mmHg — AB (ref 80.0–100.0)
Patient temperature: 98.6
Pressure control: 8 cmH2O
RATE: 14 resp/min
RATE: 22 resp/min
TCO2: 34.6 mmol/L (ref 0–100)
TCO2: 33.9 mmol/L (ref 0–100)
TCO2: 33.1 mmol/L (ref 0–100)
pCO2 arterial: 73.3 mmHg (ref 35.0–45.0)
pO2, Arterial: 62.8 mmHg — ABNORMAL LOW (ref 80.0–100.0)

## 2015-04-19 LAB — I-STAT TROPONIN, ED: TROPONIN I, POC: 0.25 ng/mL — AB (ref 0.00–0.08)

## 2015-04-19 LAB — BLOOD GAS, VENOUS
Acid-Base Excess: 9.9 mmol/L — ABNORMAL HIGH (ref 0.0–2.0)
BICARBONATE: 39.1 meq/L — AB (ref 20.0–24.0)
DELIVERY SYSTEMS: POSITIVE
DRAWN BY: 235321
FIO2: 0.3
Mode: POSITIVE
O2 SAT: 18.3 %
PATIENT TEMPERATURE: 98.6
RATE: 28 resp/min
TCO2: 34.9 mmol/L (ref 0–100)
pCO2, Ven: 73.3 mmHg (ref 45.0–50.0)
pH, Ven: 7.347 — ABNORMAL HIGH (ref 7.250–7.300)
pO2, Ven: 17.3 mmHg — CL (ref 30.0–45.0)

## 2015-04-19 LAB — CBC
HCT: 49 % — ABNORMAL HIGH (ref 36.0–46.0)
Hemoglobin: 15.9 g/dL — ABNORMAL HIGH (ref 12.0–15.0)
MCH: 29.1 pg (ref 26.0–34.0)
MCHC: 32.4 g/dL (ref 30.0–36.0)
MCV: 89.7 fL (ref 78.0–100.0)
PLATELETS: 196 10*3/uL (ref 150–400)
RBC: 5.46 MIL/uL — ABNORMAL HIGH (ref 3.87–5.11)
RDW: 16 % — AB (ref 11.5–15.5)
WBC: 12.6 10*3/uL — ABNORMAL HIGH (ref 4.0–10.5)

## 2015-04-19 LAB — TSH: TSH: 2.806 u[IU]/mL (ref 0.350–4.500)

## 2015-04-19 LAB — APTT: aPTT: 25 seconds (ref 24–37)

## 2015-04-19 LAB — PROTIME-INR
INR: 0.99 (ref 0.00–1.49)
PROTHROMBIN TIME: 13.3 s (ref 11.6–15.2)

## 2015-04-19 LAB — LACTIC ACID, PLASMA
LACTIC ACID, VENOUS: 1.5 mmol/L (ref 0.5–2.0)
Lactic Acid, Venous: 1.3 mmol/L (ref 0.5–2.0)

## 2015-04-19 LAB — PROCALCITONIN: Procalcitonin: 0.42 ng/mL

## 2015-04-19 LAB — BRAIN NATRIURETIC PEPTIDE: B NATRIURETIC PEPTIDE 5: 1672.8 pg/mL — AB (ref 0.0–100.0)

## 2015-04-19 LAB — MRSA PCR SCREENING: MRSA by PCR: NEGATIVE

## 2015-04-19 LAB — TROPONIN I: TROPONIN I: 0.33 ng/mL — AB (ref <0.031)

## 2015-04-19 MED ORDER — METHYLPREDNISOLONE SODIUM SUCC 125 MG IJ SOLR
125.0000 mg | Freq: Once | INTRAMUSCULAR | Status: AC
  Administered 2015-04-19: 125 mg via INTRAVENOUS
  Filled 2015-04-19: qty 2

## 2015-04-19 MED ORDER — IPRATROPIUM-ALBUTEROL 0.5-2.5 (3) MG/3ML IN SOLN
3.0000 mL | RESPIRATORY_TRACT | Status: DC
  Administered 2015-04-19 – 2015-04-22 (×19): 3 mL via RESPIRATORY_TRACT
  Filled 2015-04-19 (×17): qty 3

## 2015-04-19 MED ORDER — DEXTROSE 5 % IV SOLN
1.0000 g | INTRAVENOUS | Status: DC
  Administered 2015-04-20 – 2015-04-27 (×8): 1 g via INTRAVENOUS
  Filled 2015-04-19 (×8): qty 10

## 2015-04-19 MED ORDER — IPRATROPIUM-ALBUTEROL 0.5-2.5 (3) MG/3ML IN SOLN
3.0000 mL | RESPIRATORY_TRACT | Status: DC
Start: 2015-04-19 — End: 2015-04-19

## 2015-04-19 MED ORDER — SODIUM CHLORIDE 0.9 % IV SOLN: 250.0000 mL | INTRAVENOUS | Status: DC | PRN

## 2015-04-19 MED ORDER — ONDANSETRON HCL 4 MG/2ML IJ SOLN
4.0000 mg | Freq: Four times a day (QID) | INTRAMUSCULAR | Status: DC | PRN
  Administered 2015-04-22: 4 mg via INTRAVENOUS
  Filled 2015-04-19 (×2): qty 2

## 2015-04-19 MED ORDER — DEXTROSE 5 % IV SOLN
1.0000 g | Freq: Once | INTRAVENOUS | Status: AC
  Administered 2015-04-19: 1 g via INTRAVENOUS
  Filled 2015-04-19: qty 10

## 2015-04-19 MED ORDER — SODIUM CHLORIDE 0.9 % IV SOLN
Freq: Once | INTRAVENOUS | Status: AC
  Administered 2015-04-19: 12:00:00 via INTRAVENOUS

## 2015-04-19 MED ORDER — LEVOTHYROXINE SODIUM 100 MCG IV SOLR
68.0000 ug | Freq: Every day | INTRAVENOUS | Status: DC
  Administered 2015-04-20 – 2015-04-21 (×2): 68 ug via INTRAVENOUS
  Filled 2015-04-19 (×2): qty 5

## 2015-04-19 MED ORDER — DEXTROSE 5 % IV SOLN: 1.0000 g | Freq: Once | INTRAVENOUS | Status: DC

## 2015-04-19 MED ORDER — METHYLPREDNISOLONE SODIUM SUCC 125 MG IJ SOLR
60.0000 mg | Freq: Four times a day (QID) | INTRAMUSCULAR | Status: DC
  Administered 2015-04-19 – 2015-04-21 (×7): 60 mg via INTRAVENOUS
  Filled 2015-04-19 (×7): qty 2

## 2015-04-19 MED ORDER — ACETAMINOPHEN 325 MG PO TABS: 650.0000 mg | ORAL_TABLET | Freq: Four times a day (QID) | ORAL | Status: DC | PRN

## 2015-04-19 MED ORDER — ENOXAPARIN SODIUM 40 MG/0.4ML ~~LOC~~ SOLN
40.0000 mg | SUBCUTANEOUS | Status: DC
  Administered 2015-04-19: 40 mg via SUBCUTANEOUS
  Filled 2015-04-19: qty 0.4

## 2015-04-19 MED ORDER — DEXTROSE 5 % IV SOLN
500.0000 mg | Freq: Once | INTRAVENOUS | Status: AC
  Administered 2015-04-19: 500 mg via INTRAVENOUS
  Filled 2015-04-19: qty 500

## 2015-04-19 MED ORDER — ASPIRIN EC 81 MG PO TBEC
81.0000 mg | DELAYED_RELEASE_TABLET | Freq: Once | ORAL | Status: DC
  Filled 2015-04-19: qty 1

## 2015-04-19 MED ORDER — SODIUM CHLORIDE 0.9 % IJ SOLN
3.0000 mL | Freq: Two times a day (BID) | INTRAMUSCULAR | Status: DC
  Administered 2015-04-20: 3 mL via INTRAVENOUS

## 2015-04-19 MED ORDER — DEXTROSE 5 % IV SOLN: 500.0000 mg | Freq: Once | INTRAVENOUS | Status: DC

## 2015-04-19 MED ORDER — ASPIRIN EC 81 MG PO TBEC
81.0000 mg | DELAYED_RELEASE_TABLET | Freq: Every day | ORAL | Status: DC
  Administered 2015-04-20 – 2015-04-27 (×8): 81 mg via ORAL
  Filled 2015-04-19 (×8): qty 1

## 2015-04-19 MED ORDER — ALBUTEROL SULFATE (2.5 MG/3ML) 0.083% IN NEBU
5.0000 mg | INHALATION_SOLUTION | Freq: Once | RESPIRATORY_TRACT | Status: AC
  Administered 2015-04-19: 5 mg via RESPIRATORY_TRACT
  Filled 2015-04-19: qty 6

## 2015-04-19 MED ORDER — ACETAMINOPHEN 650 MG RE SUPP: 650.0000 mg | Freq: Four times a day (QID) | RECTAL | Status: DC | PRN

## 2015-04-19 MED ORDER — IPRATROPIUM BROMIDE 0.02 % IN SOLN
0.5000 mg | Freq: Once | RESPIRATORY_TRACT | Status: AC
  Administered 2015-04-19: 0.5 mg via RESPIRATORY_TRACT
  Filled 2015-04-19: qty 2.5

## 2015-04-19 MED ORDER — SODIUM CHLORIDE 0.9 % IJ SOLN: 3.0000 mL | INTRAMUSCULAR | Status: DC | PRN

## 2015-04-19 MED ORDER — DEXTROSE 5 % IV SOLN
500.0000 mg | INTRAVENOUS | Status: DC
  Administered 2015-04-20 – 2015-04-23 (×4): 500 mg via INTRAVENOUS
  Filled 2015-04-19 (×5): qty 500

## 2015-04-19 MED ORDER — ONDANSETRON HCL 4 MG PO TABS: 4.0000 mg | ORAL_TABLET | Freq: Four times a day (QID) | ORAL | Status: DC | PRN

## 2015-04-19 NOTE — ED Notes (Signed)
Dr. Coralyn Pear aware that we will be unable to send lactic at this time, pt had to be stuck femoral for previous labs. Awaiting PCCM consult at this time for possible PICC

## 2015-04-19 NOTE — ED Notes (Signed)
I attempted to collect labs on patient twice and was unsuccessful.  I made nurse aware. 

## 2015-04-19 NOTE — ED Notes (Signed)
Respiratory called to adjust mask and BPAP machine

## 2015-04-19 NOTE — Progress Notes (Signed)
PHARMACY NOTE -  Rocephin, Azithromycin, IV Synthroid  Pharmacy has been assisting with dosing of the above for CAP and to continue maintenance dosing. Dosages of the above remain stable at   Azithromycin 500 mg IV q24 hr  Ceftriaxone 1 g IV q24 hr  Synthroid 68 mcg IV q24 hr (on 137 mcg PO daily at home)  Need for further dosage adjustment appears unlikely at present. Will sign off at this time.  Please reconsult if a change in clinical status warrants re-evaluation of dosage.  Reuel Boom, PharmD, BCPS Pager: [phone] 04/19/2015, 7:03 PM

## 2015-04-19 NOTE — ED Notes (Signed)
MD in room drawing labwork (RN, Phlebotomy - Marylynn Pearson) and main iv lab unsuccessful

## 2015-04-19 NOTE — Consult Note (Signed)
Name: Heidi [name] MRN: [id number] DOB: 1940-12-23    ADMISSION DATE:  04/19/2015 CONSULTATION DATE:  04/19/2015  REFERRING MD :  Hospitalist Service  CHIEF COMPLAINT:  Hypercarbic Respiratory Failure  BRIEF PATIENT DESCRIPTION: 74 year old female with chronic tobacco use that is long-standing as well as known history of COPD. Presented with altered mental status for less than 24 hours and hypercarbic respiratory failure on admission.  SIGNIFICANT EVENTS 10/3 - Admit to hospital  STUDIES:  Portable CXR 10/3 - personally reviewed by me. Questionable bilateral lower lobe opacities versus atelectasis.  HISTORY OF PRESENT ILLNESS:  74 year old female with known history of COPD and ongoing tobacco use. Family reports that they had her brought to the emergency room with altered mental status starting this morning where she was hallucinating and not answering questions appropriately. For the last 2 weeks she's had a cough productive of a yellow mucus. Her daughter and son are both potential sick contacts as her daughter had a sore throat which was treated with antibiotics and her son had a recent acute illness which was called "bronchitis". In the emergency room on my arrival the patient was stuporous with pinpoint pupils and borderline hypertension. I adjusted her noninvasive positive pressure ventilation to increase her minute ventilation from 4 L to 12 L and within 15 minutes her mental status began to improve as did her blood pressure. History was obtained from the patient's children at bedside.   PAST MEDICAL HISTORY :  Past Medical History  Diagnosis Date  . COPD (chronic obstructive pulmonary disease) ([place])   . Hypothyroidism   . 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 Hyperlipidemia   . Pulmonary nodule   . 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([place])   . Depression   . Cerebrovascular disease   . Positive ANA (antinuclear antibody)   . Diverticulosis   . Esophageal  stricture   . Arthritis   . History of pneumonia   . History of stroke     mild oncoming  . Pneumonia     HX of  . Shortness of breath     periodically with COPD flair up  . Internal hemorrhoids   . Rectal ulcer   . Osteoporosis    PAST SURGICAL HISTORY Past Surgical History  Procedure Laterality Date  . Hiatal hernia repair      x 2  . Cataract extraction      bilateral  . Total abdominal hysterectomy w/ bilateral salpingoophorectomy  1998  . Bladder surgery      tack  . Hemicolectomy  2012    emergent surgery [place] [facility]  . Tonsillectomy    . Colonoscopy  06/22/2011    Procedure: COLONOSCOPY;  Surgeon: Lafayette Dragon, MD;  Location: [facility];  Service: Endoscopy;  Laterality: N/A;  . Gastric varices banding  06/22/2011    Procedure: HEMORRHOID BANDING;  Surgeon: Lafayette Dragon, MD;  Location: [facility];  Service: Endoscopy;  Laterality: N/A;    Prior to Admission medications   Medication Sig Start Date End Date Taking? Authorizing Provider  Fluticasone-Salmeterol (ADVAIR) 100-50 MCG/DOSE AEPB Inhale 1 puff into the lungs 2 (two) times daily. 12/15/14  Yes Dorothyann Peng, NP  levothyroxine (SYNTHROID, LEVOTHROID) 137 MCG tablet Take 1 tablet (137 mcg total) by mouth daily before breakfast. 02/18/15  Yes Dorothyann Peng, NP  loperamide (IMODIUM A-D) 2 MG tablet Take 1 tablet (2 mg total) by mouth 2 (two) times daily as needed for diarrhea or  loose stools. 02/17/15  Yes Dorothyann Peng, NP  mirtazapine (REMERON) 15 MG tablet Take 1 tablet (15 mg total) by mouth at bedtime. 12/15/14  Yes Dorothyann Peng, NP  alendronate (FOSAMAX) 70 MG tablet Take 1 tablet (70 mg total) by mouth every 7 (seven) days. Take with a full glass of water on an empty stomach. Patient not taking: Reported on 04/19/2015 02/11/15   Dorothyann Peng, NP  halobetasol (ULTRAVATE) 0.05 % ointment Apply topically 2 (two) times daily.      Historical Provider, MD  tiotropium (SPIRIVA HANDIHALER) 18 MCG inhalation  capsule Place 1 capsule (18 mcg total) into inhaler and inhale daily. Patient not taking: Reported on 02/17/2015 11/20/12   Kennyth Arnold, FNP   Allergies  Allergen Reactions  . Penicillins Hives    Has patient had a PCN reaction causing immediate rash, facial/tongue/throat swelling, SOB or lightheadedness with hypotension: No Has patient had a PCN reaction causing severe rash involving mucus membranes or skin necrosis: No Has patient had a PCN reaction that required hospitalization No Has patient had a PCN reaction occurring within the last 10 years: No If all of the above answers are "NO", then may proceed with Cephalosporin use.    FAMILY HISTORY:  Family History  Problem Relation Age of Onset  . Colon cancer Mother   . COPD Father   . Heart attack Father   . Stroke Maternal Aunt   . Colon polyps Brother   . Liver disease Maternal Aunt   . Breast cancer Maternal Aunt   . Hypertension Son   . Hypothyroidism Daughter     SOCIAL HISTORY: Social History   Social History  . Marital Status: Widowed    Spouse Name: N/A  . Number of Children: 3  . Years of Education: N/A   Occupational History  . RETIRED    Social History Main Topics  . Smoking status: Former Smoker    Quit date: 11/01/2009  . Smokeless tobacco: Never Used  . Alcohol Use: No  . Drug Use: No  . Sexual Activity: Not Currently   Other Topics Concern  . None   Social History Narrative   Patient has no recent travel. She is smoking 2 packs per day and has smoked since age of 23. Has cats and dogs at home. No bird or mold exposure.     REVIEW OF SYSTEMS:  An accurate review of systems is unobtainable at the time of my exam given altered mentation and BiPAP requirement.  SUBJECTIVE:   VITAL SIGNS: Temp:  [97.5 F (36.4 C)-97.7 F (36.5 C)] 97.7 F (36.5 C) (10/03 1815) Pulse Rate:  [86-110] 86 (10/03 2000) Resp:  [18-30] 22 (10/03 2000) BP: (89-148)/(38-76) 124/62 mmHg (10/03 2000) SpO2:  [80 %-97  %] 97 % (10/03 2000) FiO2 (%):  [30 %-40 %] 30 % (10/03 2004) Weight:  [80 lb 11 oz (36.6 kg)] 80 lb 11 oz (36.6 kg) (10/03 1815)  PHYSICAL EXAMINATION: General:  Stuporous but with slow improvement in mental status with increased metabolic ventilation. No acute distress.  Integument:  Warm & dry. No rash on exposed skin. No bruising. Lymphatics:  No appreciated cervical or supraclavicular lymphadenoapthy. HEENT:  No scleral injection or icterus. BiPAP mask in place. Pupils initially pinpoint.  Cardiovascular:  Regular rate. No edema. No appreciable JVD.  Pucoarse breath sounds bilaterally on BiPAP. Symmetric chest wall expansion. No accessory muscle use. Abdomen: Soft. Normal bowel sounds. Nondistended.  Musculoskeletal:  Normal bulk and tone. No joint deformity  or effusion appreciated. Neurological:  Patient stuporous but gradually became more awake. She was following commands and giving thumbs up on command. Moving all 4 extremities spontaneously and equally. Psychiatric:  Unable to assess given the patient's altered mentation and BiPAP mask in place.    Recent Labs Lab 04/19/15 1359 04/19/15 1850  NA 125*  --   K 4.4  --   CL 80*  --   CO2 35*  --   BUN 13  --   CREATININE 0.53 0.71  GLUCOSE 143*  --     Recent Labs Lab 04/19/15 1359 04/19/15 1850  HGB 15.1* 15.9*  HCT 46.0 49.0*  WBC 15.2* 12.6*  PLT 244 196   Dg Chest Port 1 View  04/19/2015   CLINICAL DATA:  Sepsis  EXAM: PORTABLE CHEST 1 VIEW  COMPARISON:  1130 hours  FINDINGS: Heterogeneous opacities throughout both lungs are again noted. There has been some clearing at the lung bases. Heart remains enlarged. No pneumothorax.  IMPRESSION: Improved basilar opacities. Opacities throughout the remainder of the lungs persist.   Electronically Signed   By: Marybelle Killings M.D.   On: 04/19/2015 20:20   Dg Chest Portable 1 View  04/19/2015   CLINICAL DATA:  Increased shortness of breath for 1 day. History of COPD.  EXAM:  PORTABLE CHEST 1 VIEW  COMPARISON:  01/05/2015  FINDINGS: Lungs are hyperinflated. There is significant interstitial prominence which has increased since prior study. Heart size is enlarged. There is opacity of both lung bases, left greater than right. This is increased compared with previous studies in may reflect acute infectious process. Suspect left pleural effusion.  IMPRESSION: 1. Bronchitic changes and COPD. 2. Cardiomegaly. 3. Bibasilar opacities left greater than right consistent with infectious process.   Electronically Signed   By: Nolon Nations M.D.   On: 04/19/2015 11:58    ASSESSMENT / PLAN:  74 year old female with known underlying COPD & ongoing tobacco use. Presenting with acute hypercarbic respiratory failure secondary to acute bronchitis/early community-acquired pneumonia. Patient's borderline hypotension & altered mental status are secondary to her hypercarbia. I will reassess the patient in the stepdown unit and again with adjustment in her noninvasive positive pressure ventilation settings her mental status and blood pressure began to improve. I discussed the patient's case with nurse as well as respiratory therapy at bedside.   1. Acute on chronic hypercarbic respiratory failure: Continuing BiPAP to maintain minimum ventilation near 12 L. Continuing scheduled to nebs every 4 hours & Solu-Medrol. Holding Advair & Spiriva at this time. 2. Acute bronchitis/early community acquired pneumonia: Respiratory viral panel pending. Checking urine streptococcal & Legionella antigens. Agree with antibiotic coverage with Rocephin & azithromycin. Blood & respiratory cultures ordered. 3. Altered mental status: Encephalopathy likely secondary to hypercarbia. Improving with treatment of hypercarbia. 4. Elevated troponin I/NSTEMI/subendocardial ischemia: Ordering transthoracic echocardiogram to evaluate wall motion. 5. Hyponatremia: Defer to primary service and management. 6. Poor venous access:  Ordering IV team consult for PICC line placement. 7. Diet: Nothing by mouth. 8. Ongoing tobacco use: Plan for tobacco cessation education once patient recovers from this acute respiratory failure.  Sonia Baller Ashok Cordia, M.D. [facility] Pager:  [phone] After 3pm or if no response, call [phone] 04/19/2015, 9:16 PM

## 2015-04-19 NOTE — ED Provider Notes (Signed)
Apiration of blood/fluid 2:00 PM Performed by: Hoy Morn Consent obtained. Required items: required blood products, implants, devices, and special equipment available Patient identity confirmed: verbally with patient Time out: Immediately prior to procedure a "time out" was called to verify the correct patient, procedure, equipment, support staff and site/side marked as required. Preparation: Patient was prepped and draped in the usual sterile fashion. Patient tolerance: Patient tolerated the procedure well with no immediate complications.  Location of aspiration: right femoral vein     Jola Schmidt, MD 04/19/15 1400

## 2015-04-19 NOTE — ED Notes (Signed)
I attempted to stick in left arm patient had a large bruise on the right forearm upon my arrival.

## 2015-04-19 NOTE — ED Notes (Signed)
Main phlebotomy able to get enough blood for i-stat but not enough for labs. RN and MD aware

## 2015-04-19 NOTE — Progress Notes (Signed)
Utilization Review completed.  Logen Fowle RN CM  

## 2015-04-19 NOTE — H&P (Signed)
[facility] History and Physical  Heidi [name] YDX:[id number] DOB: 12-Dec-1940 DOA: 04/19/2015  Referring physician:  PCP: Dorothyann Peng, NP   Chief Complaint: Shortness of breath  HPI: Heidi [name] is a 74 y.o. female with a past medical history of chronic obstructive pulmonary disease, ongoing tobacco abuse, history of 4-5 mm pulmonary nodules seen on CT scan on 02/22/2015, brought to the emergency department by EMS. History was obtained from family members present at bedside and emergency room staff as she was unable to provide history given hypercarbia. Family members reporting that she has had progressive shortness of breath over the past 7-10 days associate with cough and brownish sputum production. During this time she has become progressively weaker and having poor tolerance to physical exertion. She currently resides at home with her daughter and son-in-law. In the last 24 hours she has had significant mental status changes becoming increasing confused and lethargic. Family members calling EMS this morning finding her to be hypoxemic on the field.  In the emergency department ABG revealed a pH of 7.27 with PCO2 of 86.6 and PO2 of 62.8. She was placed on BiPAP. Chest x-ray revealed the presence of bilateral infiltrates. She was given a dose of Solu-Medrol as well as ceftriaxone and azithromycin. I spoke with PCCM in the emergency room.                                                                                                                                   Review of Systems:  I could not obtain a reliable review of systems due to significant mental status changes and hypercarbia.  Past Medical History  Diagnosis Date  . COPD (chronic obstructive pulmonary disease) ([place])   . Hypothyroidism   . 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 Hyperlipidemia   . Pulmonary nodule   . 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([place])   . Depression   . Cerebrovascular disease   .  Positive ANA (antinuclear antibody)   . Diverticulosis   . Esophageal stricture   . Arthritis   . History of pneumonia   . History of stroke     mild oncoming  . Pneumonia     HX of  . Shortness of breath     periodically with COPD flair up  . Internal hemorrhoids   . Rectal ulcer   . Osteoporosis    Past Surgical History  Procedure Laterality Date  . Hiatal hernia repair      x 2  . Cataract extraction      bilateral  . Total abdominal hysterectomy w/ bilateral salpingoophorectomy  1998  . Bladder surgery      tack  . Hemicolectomy  2012    emergent surgery [place]  . Tonsillectomy    . Colonoscopy  06/22/2011    Procedure: COLONOSCOPY;  Surgeon: Lafayette Dragon, MD;  Location: [facility];  Service:  Endoscopy;  Laterality: N/A;  . Gastric varices banding  06/22/2011    Procedure: HEMORRHOID BANDING;  Surgeon: Lafayette Dragon, MD;  Location: [facility]OSCOPY;  Service: Endoscopy;  Laterality: N/A;   Social History:  reports that she quit smoking about 5 years ago. She has never used smokeless tobacco. She reports that she does not drink alcohol or use illicit drugs.  Allergies  Allergen Reactions  . Penicillins Hives    Has patient had a PCN reaction causing immediate rash, facial/tongue/throat swelling, SOB or lightheadedness with hypotension: No Has patient had a PCN reaction causing severe rash involving mucus membranes or skin necrosis: No Has patient had a PCN reaction that required hospitalization No Has patient had a PCN reaction occurring within the last 10 years: No If all of the above answers are "NO", then may proceed with Cephalosporin use.    Family History  Problem Relation Age of Onset  . Colon cancer Mother   . COPD Father   . Heart attack Father   . Stroke Maternal Aunt   . Colon polyps Brother   . Liver disease Maternal Aunt   . Breast cancer Maternal Aunt     Prior to Admission medications   Medication Sig Start Date End Date Taking?  Authorizing Provider  Fluticasone-Salmeterol (ADVAIR) 100-50 MCG/DOSE AEPB Inhale 1 puff into the lungs 2 (two) times daily. 12/15/14  Yes Dorothyann Peng, NP  levothyroxine (SYNTHROID, LEVOTHROID) 137 MCG tablet Take 1 tablet (137 mcg total) by mouth daily before breakfast. 02/18/15  Yes Dorothyann Peng, NP  loperamide (IMODIUM A-D) 2 MG tablet Take 1 tablet (2 mg total) by mouth 2 (two) times daily as needed for diarrhea or loose stools. 02/17/15  Yes Dorothyann Peng, NP  mirtazapine (REMERON) 15 MG tablet Take 1 tablet (15 mg total) by mouth at bedtime. 12/15/14  Yes Dorothyann Peng, NP  alendronate (FOSAMAX) 70 MG tablet Take 1 tablet (70 mg total) by mouth every 7 (seven) days. Take with a full glass of water on an empty stomach. Patient not taking: Reported on 04/19/2015 02/11/15   Dorothyann Peng, NP  halobetasol (ULTRAVATE) 0.05 % ointment Apply topically 2 (two) times daily.      Historical Provider, MD  tiotropium (SPIRIVA HANDIHALER) 18 MCG inhalation capsule Place 1 capsule (18 mcg total) into inhaler and inhale daily. Patient not taking: Reported on 02/17/2015 11/20/12   Kennyth Arnold, FNP   Physical Exam: Filed Vitals:   04/19/15 1500 04/19/15 1536 04/19/15 1609 04/19/15 1610  BP: 122/63 128/68  109/61  Pulse: 90 95  92  Temp:  97.5 F (36.4 C)    TempSrc:  Axillary    Resp:  23  20  SpO2: 89% 90% 90% 89%    Wt Readings from Last 3 Encounters:  02/17/15 34.882 kg (76 lb 14.4 oz)  01/05/15 36.968 kg (81 lb 8 oz)  12/15/14 38.51 kg (84 lb 14.4 oz)    General: She is ill-appearing, confused, unable to provide history or participate in her plan of care. She is on BiPAP Eyes: PERRL, normal lids, irises & conjunctiva ENT: grossly normal hearing, lips & tongue, as dry oral mucosa. Neck: no LAD, masses or thyromegaly, positive jugular venous distention Cardiovascular: RRR, no m/r/g. No LE edema. Telemetry: SR, no arrhythmias  Respiratory: She has diminished breath sounds bilaterally with a  prolonged expiratory phase. There are a few rhonchi and wheezes present on both lung fields. She is on BiPAP during my evaluation. Abdomen: soft, ntnd Skin:  no rash or induration seen on limited exam Musculoskeletal: grossly normal tone BUE/BLE Psychiatric: She is confused, lethargic at times, unable to provide reliable history. Neurologic: She is confused, disoriented, lab showing hypercarbia. Had difficulties following simple commands for me. I could not obtain a reliable neurologic examination.           Labs on Admission:  Basic Metabolic Panel:  Recent Labs Lab 04/19/15 1359  NA 125*  K 4.4  CL 80*  CO2 35*  GLUCOSE 143*  BUN 13  CREATININE 0.53  CALCIUM 8.4*   Liver Function Tests:  Recent Labs Lab 04/19/15 1359  AST 21  ALT 10*  ALKPHOS 92  BILITOT 0.7  PROT 6.4*  ALBUMIN 3.0*   No results for input(s): LIPASE, AMYLASE in the last 168 hours. No results for input(s): AMMONIA in the last 168 hours. CBC:  Recent Labs Lab 04/19/15 1359  WBC 15.2*  NEUTROABS 14.2*  HGB 15.1*  HCT 46.0  MCV 89.8  PLT 244   Cardiac Enzymes: No results for input(s): CKTOTAL, CKMB, CKMBINDEX, TROPONINI in the last 168 hours.  BNP (last 3 results)  Recent Labs  04/19/15 1359  BNP 1672.8*    ProBNP (last 3 results) No results for input(s): PROBNP in the last 8760 hours.  CBG: No results for input(s): GLUCAP in the last 168 hours.  Radiological Exams on Admission: Dg Chest Portable 1 View  04/19/2015   CLINICAL DATA:  Increased shortness of breath for 1 day. History of COPD.  EXAM: PORTABLE CHEST 1 VIEW  COMPARISON:  01/05/2015  FINDINGS: Lungs are hyperinflated. There is significant interstitial prominence which has increased since prior study. Heart size is enlarged. There is opacity of both lung bases, left greater than right. This is increased compared with previous studies in may reflect acute infectious process. Suspect left pleural effusion.  IMPRESSION: 1.  Bronchitic changes and COPD. 2. Cardiomegaly. 3. Bibasilar opacities left greater than right consistent with infectious process.   Electronically Signed   By: Nolon Nations M.D.   On: 04/19/2015 11:58    EKG: Independently reviewed. No acute ischemic changes.  Assessment/Plan Principal Problem:   Sepsis ([place]) Active Problems:   Acute respiratory failure with hypoxia and hypercarbia ([facility])   CAP (community acquired pneumonia)   Hypothyroidism   COPD exacerbation ([place])   Tobacco abuse   1. Sepsis. Present on admission evidence by a white count of 15,200, heart rate of 111, respiratory rate of 27, with chest x-ray showing the presence of bilateral infiltrates. Thus suspected source of infection to be community-acquired pneumonia. Patient is critically ill and will require admission to the step down unit. I discussed case with pulmonary critical care medicine who will consult. She will be based on sepsis protocol. Will obtain lactate and pro-calcitonin levels. Follow-up on blood cultures. Treat with ceftriaxone and azithromycin for community-acquired pneumonia. 2. Acute hypercarbic and hypoxemic respiratory failure. Initial ABG revealed a pH of 7.27 with PCO2 of 86.6 and a PO2 of 62.8. She required BiPAP. I believe that chronic obstructive pulmonary disease exacerbation and community acquired pneumonia are probable causes of her respiratory failure. Labs also revealed an elevated BNP of 1672. I'm not sure of CHF is a contributor. Per medical records she does not have a history of CHF. Prior to Lasix administration will check a lactate and pro-calcitonin level. Will treat with IV steroids and IV antibiotic therapy. 3. Chronic obstructive pulmonary disease exacerbation. Patient having a history of COPD and continues to smoke around  2 packs of cigarettes daily. Only gets possible that underlying infectious process precipitated COPD exacerbation. Will treat with Solu-Medrol 60 mg IV every 6 hours,  scheduled duo nebs along with empiric IV antimicrobial therapy. 4. Community acquire pneumonia. Chest x-ray on presentation showed the presence of bibasilar opacities consistent with infectious process per radiology. Will continue ceftriaxone 1 g IV every 24 hours and azithromycin 500 mg IV every 24 hours. Follow-up on blood cultures and sputum cultures. Repeat a.m. chest x-ray ordered. 5. Elevated troponin. Initial lab showed a troponin of 0.25. Her EKG did not reveal acute ischemic changes. Family members did not reports history of chest pain. This could be secondary to critical illness/sepsis/demand ischemia. Will trend troponins overnight. Labs also revealed a BNP 1672. Will further workup with a transthoracic echocardiogram. 6. Hyponatremia. Lab work showing a sodium of 125. This could be related to dehydration or SIADH. Will check a serum osm, urine osm and urine sodium.  7. Hypothyroidism. Prior lab work has shown an elevated TSH of 32.19 on 02/17/2015. Per medical records she has a history of noncompliance. Will check a TSH at this time. Since she is nothing by mouth and on BiPAP, will consult pharmacy for IV Synthroid dosing. 8. DVT prophylaxis. Lovenox   Code Status: Full Code. I confirms status with her healthcare power attorney and family members were present at bedside. Family Communication: I spoke with her healthcare power of attorney and daughters present at bedside Disposition Plan: Anticipate patient will require greater than 2 nights hospitalization will admit to the inpatient service  Time spent: 72 min  Kelvin Cellar [facility] Pager [phone]

## 2015-04-19 NOTE — ED Notes (Signed)
Per [facility], Pt, from home, c/o increasing weakness and SOB x 1 day and diarrhea x 3 weeks.  Denies pain.  Hx of COPD and PNA. 5mg  Albuterol given en route.

## 2015-04-19 NOTE — Progress Notes (Signed)
eLink Physician-Brief Progress Note Patient Name: FINA HEIZER DOB: 10/31/1940 MRN: [id number]   Date of Service  04/19/2015  HPI/Events of Note  Ca,era care after ICU arrival. Looks confused. Shallow rbreathing but not paradoxical  eICU Interventions  abg stat     Intervention Category Major Interventions: Acid-Base disturbance - evaluation and management  Jionni Helming 04/19/2015, 6:36 PM

## 2015-04-19 NOTE — ED Notes (Signed)
Nurse is in the room starting a IV

## 2015-04-20 ENCOUNTER — Inpatient Hospital Stay (HOSPITAL_COMMUNITY): Payer: Medicare Other

## 2015-04-20 ENCOUNTER — Other Ambulatory Visit (HOSPITAL_COMMUNITY): Payer: Medicare Other

## 2015-04-20 DIAGNOSIS — R0600 Dyspnea, unspecified: Secondary | ICD-10-CM

## 2015-04-20 DIAGNOSIS — J9601 Acute respiratory failure with hypoxia: Secondary | ICD-10-CM

## 2015-04-20 DIAGNOSIS — A021 Salmonella sepsis: Secondary | ICD-10-CM

## 2015-04-20 DIAGNOSIS — J9602 Acute respiratory failure with hypercapnia: Secondary | ICD-10-CM

## 2015-04-20 DIAGNOSIS — J441 Chronic obstructive pulmonary disease with (acute) exacerbation: Secondary | ICD-10-CM

## 2015-04-20 LAB — RENAL FUNCTION PANEL
ALBUMIN: 2.8 g/dL — AB (ref 3.5–5.0)
ANION GAP: 15 (ref 5–15)
BUN: 18 mg/dL (ref 6–20)
CHLORIDE: 81 mmol/L — AB (ref 101–111)
CO2: 33 mmol/L — ABNORMAL HIGH (ref 22–32)
Calcium: 8.6 mg/dL — ABNORMAL LOW (ref 8.9–10.3)
Creatinine, Ser: 0.57 mg/dL (ref 0.44–1.00)
Glucose, Bld: 96 mg/dL (ref 65–99)
PHOSPHORUS: 3.4 mg/dL (ref 2.5–4.6)
POTASSIUM: 4.7 mmol/L (ref 3.5–5.1)
Sodium: 129 mmol/L — ABNORMAL LOW (ref 135–145)

## 2015-04-20 LAB — CBC WITH DIFFERENTIAL/PLATELET
BASOS ABS: 0 10*3/uL (ref 0.0–0.1)
BASOS PCT: 0 %
Eosinophils Absolute: 0 10*3/uL (ref 0.0–0.7)
Eosinophils Relative: 0 %
HEMATOCRIT: 45.1 % (ref 36.0–46.0)
HEMOGLOBIN: 15.1 g/dL — AB (ref 12.0–15.0)
Lymphocytes Relative: 5 %
Lymphs Abs: 0.8 10*3/uL (ref 0.7–4.0)
MCH: 29.3 pg (ref 26.0–34.0)
MCHC: 33.5 g/dL (ref 30.0–36.0)
MCV: 87.4 fL (ref 78.0–100.0)
MONO ABS: 0.7 10*3/uL (ref 0.1–1.0)
Monocytes Relative: 5 %
NEUTROS ABS: 13 10*3/uL — AB (ref 1.7–7.7)
NEUTROS PCT: 90 %
Platelets: 192 10*3/uL (ref 150–400)
RBC: 5.16 MIL/uL — AB (ref 3.87–5.11)
RDW: 15.9 % — AB (ref 11.5–15.5)
WBC: 14.5 10*3/uL — AB (ref 4.0–10.5)

## 2015-04-20 LAB — BLOOD GAS, ARTERIAL
Acid-Base Excess: 10.8 mmol/L — ABNORMAL HIGH (ref 0.0–2.0)
Bicarbonate: 34.5 mEq/L — ABNORMAL HIGH (ref 20.0–24.0)
DRAWN BY: 441261
Expiratory PAP: 5
FIO2: 0.3
Inspiratory PAP: 20
Mode: POSITIVE
O2 Saturation: 93.9 %
PATIENT TEMPERATURE: 98.6
PCO2 ART: 40.4 mmHg (ref 35.0–45.0)
PO2 ART: 68.7 mmHg — AB (ref 80.0–100.0)
RATE: 28 resp/min
TCO2: 29.5 mmol/L (ref 0–100)
pH, Arterial: 7.54 — ABNORMAL HIGH (ref 7.350–7.450)

## 2015-04-20 LAB — URINALYSIS, ROUTINE W REFLEX MICROSCOPIC
Glucose, UA: NEGATIVE mg/dL
Hgb urine dipstick: NEGATIVE
Ketones, ur: 15 mg/dL — AB
LEUKOCYTES UA: NEGATIVE
NITRITE: NEGATIVE
Protein, ur: 30 mg/dL — AB
SPECIFIC GRAVITY, URINE: 1.022 (ref 1.005–1.030)
UROBILINOGEN UA: 2 mg/dL — AB (ref 0.0–1.0)
pH: 6 (ref 5.0–8.0)

## 2015-04-20 LAB — SODIUM, URINE, RANDOM

## 2015-04-20 LAB — EXPECTORATED SPUTUM ASSESSMENT W GRAM STAIN, RFLX TO RESP C: Special Requests: NORMAL

## 2015-04-20 LAB — URINE MICROSCOPIC-ADD ON

## 2015-04-20 LAB — TROPONIN I
TROPONIN I: 0.27 ng/mL — AB (ref <0.031)
TROPONIN I: 0.33 ng/mL — AB (ref <0.031)

## 2015-04-20 LAB — PROCALCITONIN: Procalcitonin: 0.53 ng/mL

## 2015-04-20 LAB — MAGNESIUM: Magnesium: 1.8 mg/dL (ref 1.7–2.4)

## 2015-04-20 LAB — STREP PNEUMONIAE URINARY ANTIGEN: Strep Pneumo Urinary Antigen: NEGATIVE

## 2015-04-20 LAB — OSMOLALITY, URINE: OSMOLALITY UR: 571 mosm/kg (ref 390–1090)

## 2015-04-20 LAB — OSMOLALITY: OSMOLALITY: 266 mosm/kg — AB (ref 275–300)

## 2015-04-20 MED ORDER — SODIUM CHLORIDE 0.9 % IV SOLN
INTRAVENOUS | Status: DC
  Administered 2015-04-21: 01:00:00 via INTRAVENOUS

## 2015-04-20 MED ORDER — SODIUM CHLORIDE 0.9 % IJ SOLN
10.0000 mL | INTRAMUSCULAR | Status: DC | PRN
  Administered 2015-04-23 – 2015-04-27 (×5): 10 mL
  Filled 2015-04-20 (×5): qty 40

## 2015-04-20 MED ORDER — GUAIFENESIN ER 600 MG PO TB12
1200.0000 mg | ORAL_TABLET | Freq: Two times a day (BID) | ORAL | Status: DC
  Administered 2015-04-20 – 2015-04-27 (×13): 1200 mg via ORAL
  Filled 2015-04-20 (×18): qty 2

## 2015-04-20 MED ORDER — SODIUM CHLORIDE 0.9 % IV BOLUS (SEPSIS)
500.0000 mL | Freq: Once | INTRAVENOUS | Status: AC
  Administered 2015-04-20: 500 mL via INTRAVENOUS

## 2015-04-20 MED ORDER — ENOXAPARIN SODIUM 30 MG/0.3ML ~~LOC~~ SOLN
30.0000 mg | SUBCUTANEOUS | Status: DC
  Administered 2015-04-20 – 2015-04-26 (×7): 30 mg via SUBCUTANEOUS
  Filled 2015-04-20 (×8): qty 0.3

## 2015-04-20 MED ORDER — LIP MEDEX EX OINT
TOPICAL_OINTMENT | CUTANEOUS | Status: DC | PRN
  Filled 2015-04-20: qty 7

## 2015-04-20 MED ORDER — CETYLPYRIDINIUM CHLORIDE 0.05 % MT LIQD
7.0000 mL | Freq: Two times a day (BID) | OROMUCOSAL | Status: DC
  Administered 2015-04-20 – 2015-04-27 (×13): 7 mL via OROMUCOSAL

## 2015-04-20 MED ORDER — SODIUM CHLORIDE 0.9 % IJ SOLN
10.0000 mL | Freq: Two times a day (BID) | INTRAMUSCULAR | Status: DC
  Administered 2015-04-20: 20 mL
  Administered 2015-04-20 – 2015-04-26 (×10): 10 mL

## 2015-04-20 MED ORDER — FUROSEMIDE 10 MG/ML IJ SOLN
40.0000 mg | Freq: Every day | INTRAMUSCULAR | Status: DC
  Administered 2015-04-20 – 2015-04-21 (×2): 40 mg via INTRAVENOUS
  Filled 2015-04-20 (×2): qty 4

## 2015-04-20 NOTE — Progress Notes (Signed)
Echocardiogram 2D Echocardiogram has been performed.  Tresa Res 04/20/2015, 1:26 PM

## 2015-04-20 NOTE — Progress Notes (Signed)
Peripherally Inserted Central Catheter/Midline Placement  The IV Nurse has discussed with the patient and/or persons authorized to consent for the patient, the purpose of this procedure and the potential benefits and risks involved with this procedure.  The benefits include less needle sticks, lab draws from the catheter and patient may be discharged home with the catheter.  Risks include, but not limited to, infection, bleeding, blood clot (thrombus formation), and puncture of an artery; nerve damage and irregular heat beat.  Alternatives to this procedure were also discussed.  PICC/Midline Placement Documentation  PICC / Midline Double Lumen 75/91/63 PICC Right Basilic 32 cm 0 cm (Active)  Indication for Insertion or Continuance of Line Poor Vasculature-patient has had multiple peripheral attempts or PIVs lasting less than 24 hours 04/20/2015 10:00 AM  Exposed Catheter (cm) 0 cm 04/20/2015 10:00 AM  Dressing Change Due 04/27/15 04/20/2015 10:00 AM       [name] 04/20/2015, 10:38 AM

## 2015-04-20 NOTE — Progress Notes (Signed)
Name: Heidi [name] MRN: [id number] DOB: 10-25-1940    ADMISSION DATE:  04/19/2015 CONSULTATION DATE:  04/19/2015  REFERRING MD :  Hospitalist Service  CHIEF COMPLAINT:  Hypercarbic Respiratory Failure  BRIEF PATIENT DESCRIPTION: 74 year old female with chronic tobacco use that is long-standing as well as known history of COPD. Presented with altered mental status for less than 24 hours and hypercarbic respiratory failure on admission.  SIGNIFICANT EVENTS 10/3 - Admit to hospital 10-4 remains on nimvs  STUDIES:  Portable Chest 1 View  2015/04/22   CLINICAL DATA:  Sepsis.  EXAM: PORTABLE CHEST 1 VIEW  COMPARISON:  04/19/2015, 12/22/2009.  CT 02/22/2015.  FINDINGS: Mediastinum hilar structures normal. Persistent diffuse bilateral interstitial infiltrates again noted. Left base subsegmental atelectasis. Cardiomegaly with normal pulmonary vascularity. No pleural effusion or pneumothorax.  IMPRESSION: Persistent diffuse interstitial infiltrates with left base subsegmental atelectasis.   Electronically Signed   By: Marcello Moores  Register   On: 22-Apr-2015 07:10   Dg Chest Port 1 View  04/19/2015   CLINICAL DATA:  Sepsis  EXAM: PORTABLE CHEST 1 VIEW  COMPARISON:  1130 hours  FINDINGS: Heterogeneous opacities throughout both lungs are again noted. There has been some clearing at the lung bases. Heart remains enlarged. No pneumothorax.  IMPRESSION: Improved basilar opacities. Opacities throughout the remainder of the lungs persist.   Electronically Signed   By: Marybelle Killings M.D.   On: 04/19/2015 20:20   Dg Chest Portable 1 View  04/19/2015   CLINICAL DATA:  Increased shortness of breath for 1 day. History of COPD.  EXAM: PORTABLE CHEST 1 VIEW  COMPARISON:  01/05/2015  FINDINGS: Lungs are hyperinflated. There is significant interstitial prominence which has increased since prior study. Heart size is enlarged. There is opacity of both lung bases, left greater than right. This is increased compared with  previous studies in may reflect acute infectious process. Suspect left pleural effusion.  IMPRESSION: 1. Bronchitic changes and COPD. 2. Cardiomegaly. 3. Bibasilar opacities left greater than right consistent with infectious process.   Electronically Signed   By: Nolon Nations M.D.   On: 04/19/2015 11:58     HISTORY OF PRESENT ILLNESS:  74 year old female with known history of COPD and ongoing tobacco use. Family reports that they had her brought to the emergency room with altered mental status starting this morning where she was hallucinating and not answering questions appropriately. For the last 2 weeks she's had a cough productive of a yellow mucus. Her daughter and son are both potential sick contacts as her daughter had a sore throat which was treated with antibiotics and her son had a recent acute illness which was called "bronchitis". In the emergency room on my arrival the patient was stuporous with pinpoint pupils and borderline hypertension. I adjusted her noninvasive positive pressure ventilation to increase her minute ventilation from 4 L to 12 L and within 15 minutes her mental status began to improve as did her blood pressure. History was obtained from the patient's children at bedside.     SUBJECTIVE:  Arouses to voice, poor fit with bipap  VITAL SIGNS: Temp:  [97.5 F (36.4 C)-98.7 F (37.1 C)] 97.9 F (36.6 C) 2023/04/22 0800) Pulse Rate:  [83-110] 90 22-Apr-2023 0838) Resp:  [18-30] 28 04-22-2023 0838) BP: (89-148)/(38-76) 110/63 mmHg 04-22-2023 0838) SpO2:  [80 %-97 %] 97 % 04/22/2023 0838) FiO2 (%):  [30 %-40 %] 30 % 22-Apr-2023 0838) Weight:  [80 lb 4 oz (36.4 kg)-80 lb 11 oz (36.6 kg)] 80 lb 4  oz (36.4 kg) (10/04 0500)  PHYSICAL EXAMINATION: General: On nimvs, arouses to voice and follows commands Integument:  Warm & dry. No rash on exposed skin. No bruising. HEENT:  No scleral injection or icterus. BiPAP mask in place.  Cardiovascular:  Regular rate. No edema. No appreciable JVD.  On  NIMVS, poor air movement until mask readjusted Abdomen: Soft. Normal bowel sounds. Nondistended.  Musculoskeletal:  Normal bulk and tone. No joint deformity or effusion appreciated. Neurological:  Patient stuporous but gradually became more awake. She was following commands and giving thumbs up on command. Moving all 4 extremities spontaneously and equally.    Recent Labs Lab 04/19/15 1359 04/19/15 1850 04/20/15 0618  NA 125*  --  129*  K 4.4  --  4.7  CL 80*  --  81*  CO2 35*  --  33*  BUN 13  --  18  CREATININE 0.53 0.71 0.57  GLUCOSE 143*  --  96    Recent Labs Lab 04/19/15 1359 04/19/15 1850 04/20/15 0618  HGB 15.1* 15.9* 15.1*  HCT 46.0 49.0* 45.1  WBC 15.2* 12.6* 14.5*  PLT 244 196 192   Portable Chest 1 View  04/20/2015   CLINICAL DATA:  Sepsis.  EXAM: PORTABLE CHEST 1 VIEW  COMPARISON:  04/19/2015, 12/22/2009.  CT 02/22/2015.  FINDINGS: Mediastinum hilar structures normal. Persistent diffuse bilateral interstitial infiltrates again noted. Left base subsegmental atelectasis. Cardiomegaly with normal pulmonary vascularity. No pleural effusion or pneumothorax.  IMPRESSION: Persistent diffuse interstitial infiltrates with left base subsegmental atelectasis.   Electronically Signed   By: Marcello Moores  Register   On: 04/20/2015 07:10   Dg Chest Port 1 View  04/19/2015   CLINICAL DATA:  Sepsis  EXAM: PORTABLE CHEST 1 VIEW  COMPARISON:  1130 hours  FINDINGS: Heterogeneous opacities throughout both lungs are again noted. There has been some clearing at the lung bases. Heart remains enlarged. No pneumothorax.  IMPRESSION: Improved basilar opacities. Opacities throughout the remainder of the lungs persist.   Electronically Signed   By: Marybelle Killings M.D.   On: 04/19/2015 20:20   Dg Chest Portable 1 View  04/19/2015   CLINICAL DATA:  Increased shortness of breath for 1 day. History of COPD.  EXAM: PORTABLE CHEST 1 VIEW  COMPARISON:  01/05/2015  FINDINGS: Lungs are hyperinflated. There is  significant interstitial prominence which has increased since prior study. Heart size is enlarged. There is opacity of both lung bases, left greater than right. This is increased compared with previous studies in may reflect acute infectious process. Suspect left pleural effusion.  IMPRESSION: 1. Bronchitic changes and COPD. 2. Cardiomegaly. 3. Bibasilar opacities left greater than right consistent with infectious process.   Electronically Signed   By: Nolon Nations M.D.   On: 04/19/2015 11:58    ASSESSMENT / PLAN:  74 year old female with known underlying COPD & ongoing tobacco use. Presenting with acute hypercarbic respiratory failure secondary to acute bronchitis/early community-acquired pneumonia. Patient's borderline hypotension & altered mental status are secondary to her hypercarbia. I will reassess the patient in the stepdown unit and again with adjustment in her noninvasive positive pressure ventilation settings her mental status and blood pressure began to improve. I discussed the patient's case with nurse as well as respiratory therapy at bedside.   1. Acute on chronic hypercarbic respiratory failure: Continuing BiPAP to maintain minimum ventilation near 12 L. Continuing scheduled to nebs every 4 hours & Solu-Medrol. Holding Advair & Spiriva at this time. 2. Acute bronchitis/early community acquired pneumonia: Respiratory  viral panel pending. Checking urine streptococcal & Legionella antigens. Agree with antibiotic coverage with Rocephin & azithromycin. Blood & respiratory cultures ordered. 3. Altered mental status: Encephalopathy likely secondary to hypercarbia. Improving with treatment of hypercarbia. 4. Elevated troponin I/NSTEMI/subendocardial ischemia: Ordering transthoracic echocardiogram to evaluate wall motion. 5. Hyponatremia: Defer to primary service and management. 6. Poor venous access: Ordering IV team consult for PICC line placement. 7. Diet: Nothing by mouth. 8. Ongoing  tobacco use: Plan for tobacco cessation education once patient recovers from this acute respiratory failure.  Richardson Landry Jarom Govan ACNP Maryanna Shape PCCM Pager [phone] till 3 pm If no answer page [phone] 04/20/2015, 8:48 AM

## 2015-04-20 NOTE — Progress Notes (Signed)
[facility] Progress Note Patient Name: Heidi [name] DOB: [birth year] MRN: [id number]   Date of Service  04/20/2015  HPI/Events of Note  congestion  eICU Interventions  guaf     Intervention Category Minor Interventions: Routine modifications to care plan (e.g. PRN medications for pain, fever)  Raylene Miyamoto. 04/20/2015, 4:10 PM

## 2015-04-20 NOTE — Progress Notes (Signed)
PROGRESS NOTE  Heidi [name] ZSW:[id number] DOB: 1940-11-15 DOA: 04/19/2015 PCP: Dorothyann Peng, NP  Heidi [name] is a 74 y.o. female with a past medical history of chronic obstructive pulmonary disease, ongoing tobacco abuse, history of 4-5 mm pulmonary nodules seen on CT scan on 02/22/2015, brought to the emergency department by EMS. History was obtained from family members present at bedside and emergency room staff as she was unable to provide history given hypercarbia. Family members reporting that she has had progressive shortness of breath over the past 7-10 days associate with cough and brownish sputum production. During this time she has become progressively weaker and having poor tolerance to physical exertion. She currently resides at home with her daughter and son-in-law.  Has been seen by PCCM and placed on Bipap.   Assessment/Plan: Acute hypercarbic and hypoxemic respiratory failure.  -BiPAP -chronic obstructive pulmonary disease exacerbation and community acquired pneumonia are probable causes of her respiratory failure.  -elevated BNP of 1672.  -IV steroids and IV antibiotic therapy and trial of IV lasix--will need to watch blood pressure  Chronic obstructive pulmonary disease exacerbation. -continues to smoke around 2 packs of cigarettes daily -Solu-Medrol 60 mg IV every 6 hours -scheduled duo nebs along with empiric IV antimicrobial therapy.  Community acquire pneumonia.  -ceftriaxone 1 g IV every 24 hours and azithromycin 500 mg IV every 24 hours. Follow-up on blood cultures and sputum cultures. Repeat a.m. chest x-ray ordered.  Elevated troponin.  -Initial lab showed a troponin of 0.25. Her EKG did not reveal acute ischemic changes. Family members did not reports history of chest pain. This could be secondary to critical illness/sepsis/demand ischemia. Will trend troponins overnight. Labs also revealed a BNP 1672.  -echo  Hyponatremia.  -trend    Hypothyroidism. -Prior lab work has shown an elevated TSH of 32.19 on 02/17/2015.  -TSH 2.8 currently -IV synthroid   PICC line consult as has no IV access   Code Status: full Family Communication: no family at bedside Disposition Plan:    Consultants:  PCCM  Procedures:      HPI/Subjective: Will follow commands but does not open eyes or answer questions  Objective: Filed Vitals:   04/20/15 0700  BP: 107/57  Pulse: 87  Temp: 97.9 F (36.6 C)  Resp: 25    Intake/Output Summary (Last 24 hours) at 04/20/15 0803 Last data filed at 04/20/15 0700  Gross per 24 hour  Intake    300 ml  Output    310 ml  Net    -10 ml   Filed Weights   04/19/15 1815 04/20/15 0500  Weight: 36.6 kg (80 lb 11 oz) 36.4 kg (80 lb 4 oz)    Exam:   General:  Eyes closed, on bipap  Cardiovascular: rrr  Respiratory: no wheezing  Abdomen: +BS, soft  Musculoskeletal: no edema   Data Reviewed: Basic Metabolic Panel:  Recent Labs Lab 04/19/15 1359 04/19/15 1850 04/20/15 0618  NA 125*  --  129*  K 4.4  --  4.7  CL 80*  --  81*  CO2 35*  --  33*  GLUCOSE 143*  --  96  BUN 13  --  18  CREATININE 0.53 0.71 0.57  CALCIUM 8.4*  --  8.6*  MG  --   --  1.8  PHOS  --   --  3.4   Liver Function Tests:  Recent Labs Lab 04/19/15 1359 04/20/15 0618  AST 21  --   ALT 10*  --  ALKPHOS 92  --   BILITOT 0.7  --   PROT 6.4*  --   ALBUMIN 3.0* 2.8*   No results for input(s): LIPASE, AMYLASE in the last 168 hours. No results for input(s): AMMONIA in the last 168 hours. CBC:  Recent Labs Lab 04/19/15 1359 04/19/15 1850 04/20/15 0618  WBC 15.2* 12.6* 14.5*  NEUTROABS 14.2*  --  13.0*  HGB 15.1* 15.9* 15.1*  HCT 46.0 49.0* 45.1  MCV 89.8 89.7 87.4  PLT 244 196 192   Cardiac Enzymes:  Recent Labs Lab 04/19/15 1850 04/20/15 0016 04/20/15 0618  TROPONINI 0.33* 0.33* 0.27*   BNP (last 3 results)  Recent Labs  04/19/15 1359  BNP 1672.8*    ProBNP (last  3 results) No results for input(s): PROBNP in the last 8760 hours.  CBG: No results for input(s): GLUCAP in the last 168 hours.  Recent Results (from the past 240 hour(s))  MRSA PCR Screening     Status: None   Collection Time: 04/19/15  6:07 PM  Result Value Ref Range Status   MRSA by PCR NEGATIVE NEGATIVE Final    Comment:        The GeneXpert MRSA Assay (FDA approved for NASAL specimens only), is one component of a comprehensive MRSA colonization surveillance program. It is not intended to diagnose MRSA infection nor to guide or monitor treatment for MRSA infections.   Culture, blood (x 2)     Status: None (Preliminary result)   Collection Time: 04/19/15  6:35 PM  Result Value Ref Range Status   Specimen Description BLOOD LEFT FOREARM  Final   Special Requests IN PEDIATRIC BOTTLE 3 ML  Final   Culture PENDING  Incomplete   Report Status PENDING  Incomplete  Culture, blood (x 2)     Status: None (Preliminary result)   Collection Time: 04/19/15  6:50 PM  Result Value Ref Range Status   Specimen Description BLOOD LEFT ARM  Final   Special Requests IN PEDIATRIC BOTTLE 2 ML  Final   Culture PENDING  Incomplete   Report Status PENDING  Incomplete     Studies: Portable Chest 1 View  04/20/2015   CLINICAL DATA:  Sepsis.  EXAM: PORTABLE CHEST 1 VIEW  COMPARISON:  04/19/2015, 12/22/2009.  CT 02/22/2015.  FINDINGS: Mediastinum hilar structures normal. Persistent diffuse bilateral interstitial infiltrates again noted. Left base subsegmental atelectasis. Cardiomegaly with normal pulmonary vascularity. No pleural effusion or pneumothorax.  IMPRESSION: Persistent diffuse interstitial infiltrates with left base subsegmental atelectasis.   Electronically Signed   By: Marcello Moores  Register   On: 04/20/2015 07:10   Dg Chest Port 1 View  04/19/2015   CLINICAL DATA:  Sepsis  EXAM: PORTABLE CHEST 1 VIEW  COMPARISON:  1130 hours  FINDINGS: Heterogeneous opacities throughout both lungs are again  noted. There has been some clearing at the lung bases. Heart remains enlarged. No pneumothorax.  IMPRESSION: Improved basilar opacities. Opacities throughout the remainder of the lungs persist.   Electronically Signed   By: Marybelle Killings M.D.   On: 04/19/2015 20:20   Dg Chest Portable 1 View  04/19/2015   CLINICAL DATA:  Increased shortness of breath for 1 day. History of COPD.  EXAM: PORTABLE CHEST 1 VIEW  COMPARISON:  01/05/2015  FINDINGS: Lungs are hyperinflated. There is significant interstitial prominence which has increased since prior study. Heart size is enlarged. There is opacity of both lung bases, left greater than right. This is increased compared with previous studies in may reflect acute  infectious process. Suspect left pleural effusion.  IMPRESSION: 1. Bronchitic changes and COPD. 2. Cardiomegaly. 3. Bibasilar opacities left greater than right consistent with infectious process.   Electronically Signed   By: Nolon Nations M.D.   On: 04/19/2015 11:58    Scheduled Meds: . aspirin EC  81 mg Oral Daily  . azithromycin  500 mg Intravenous Q24H  . cefTRIAXone (ROCEPHIN)  IV  1 g Intravenous Q24H  . enoxaparin (LOVENOX) injection  40 mg Subcutaneous Q24H  . ipratropium-albuterol  3 mL Nebulization Q4H  . levothyroxine  68 mcg Intravenous Daily  . methylPREDNISolone (SOLU-MEDROL) injection  60 mg Intravenous 4 times per day  . sodium chloride  3 mL Intravenous Q12H  . sodium chloride  3 mL Intravenous Q12H   Continuous Infusions:  Antibiotics Given (last 72 hours)    None      Principal Problem:   Sepsis ([place]) Active Problems:   Hypothyroidism   Acute respiratory failure with hypoxia and hypercarbia (HCC)   COPD exacerbation (HCC)   CAP (community acquired pneumonia)   Tobacco abuse    Time spent: 35 min    Amous Crewe  [facility] Pager [phone]. If 7PM-7AM, please contact night-coverage at [email or web address], password [facility] 04/20/2015, 8:03 AM  LOS: 1 day

## 2015-04-21 ENCOUNTER — Inpatient Hospital Stay (HOSPITAL_COMMUNITY): Payer: Medicare Other

## 2015-04-21 LAB — CBC
HCT: 39.1 % (ref 36.0–46.0)
HEMOGLOBIN: 12.6 g/dL (ref 12.0–15.0)
MCH: 28.8 pg (ref 26.0–34.0)
MCHC: 32.2 g/dL (ref 30.0–36.0)
MCV: 89.5 fL (ref 78.0–100.0)
PLATELETS: 190 10*3/uL (ref 150–400)
RBC: 4.37 MIL/uL (ref 3.87–5.11)
RDW: 16.1 % — ABNORMAL HIGH (ref 11.5–15.5)
WBC: 7.9 10*3/uL (ref 4.0–10.5)

## 2015-04-21 LAB — BASIC METABOLIC PANEL
ANION GAP: 9 (ref 5–15)
BUN: 15 mg/dL (ref 6–20)
CO2: 36 mmol/L — AB (ref 22–32)
Calcium: 8.3 mg/dL — ABNORMAL LOW (ref 8.9–10.3)
Chloride: 86 mmol/L — ABNORMAL LOW (ref 101–111)
Creatinine, Ser: 0.39 mg/dL — ABNORMAL LOW (ref 0.44–1.00)
GFR calc Af Amer: >60 mL/min (ref >60)
GLUCOSE: 113 mg/dL — AB (ref 65–99)
POTASSIUM: 4 mmol/L (ref 3.5–5.1)
Sodium: 131 mmol/L — ABNORMAL LOW (ref 135–145)

## 2015-04-21 LAB — MAGNESIUM: Magnesium: 1.8 mg/dL (ref 1.7–2.4)

## 2015-04-21 LAB — LACTIC ACID, PLASMA: LACTIC ACID, VENOUS: 0.7 mmol/L (ref 0.5–2.0)

## 2015-04-21 LAB — LEGIONELLA PNEUMOPHILA SEROGP 1 UR AG: L. PNEUMOPHILA SEROGP 1 UR AG: NEGATIVE

## 2015-04-21 LAB — PROCALCITONIN: PROCALCITONIN: 0.12 ng/mL

## 2015-04-21 MED ORDER — LEVOTHYROXINE SODIUM 137 MCG PO TABS
137.0000 ug | ORAL_TABLET | Freq: Every day | ORAL | Status: DC
  Administered 2015-04-22 – 2015-04-27 (×6): 137 ug via ORAL
  Filled 2015-04-21 (×9): qty 1

## 2015-04-21 MED ORDER — METHYLPREDNISOLONE SODIUM SUCC 125 MG IJ SOLR
60.0000 mg | Freq: Three times a day (TID) | INTRAMUSCULAR | Status: DC
  Administered 2015-04-21 – 2015-04-26 (×15): 60 mg via INTRAVENOUS
  Filled 2015-04-21 (×9): qty 0.96
  Filled 2015-04-21 (×3): qty 2
  Filled 2015-04-21: qty 0.96
  Filled 2015-04-21 (×2): qty 2
  Filled 2015-04-21: qty 0.96
  Filled 2015-04-21: qty 2

## 2015-04-21 MED ORDER — SODIUM CHLORIDE 0.9 % IV BOLUS (SEPSIS)
500.0000 mL | Freq: Once | INTRAVENOUS | Status: AC
  Administered 2015-04-21: 500 mL via INTRAVENOUS

## 2015-04-21 NOTE — Therapy (Signed)
MBS completed in radiology. Please see report under imaging. Thank you for this referral.  Celia B. Quentin Ore [facility] [place], [place] [phone]

## 2015-04-21 NOTE — Progress Notes (Signed)
Pt's BP 92/42 with HR 100. Urinary output at 83cc/4hr. Pt with only complaint of being NPO. NP on call notified and orders received and implemented. Will continue to monitor.

## 2015-04-21 NOTE — Progress Notes (Signed)
Pts BP has remained low throughout the night. Currently 88/46 with HR 79. Pt sleeping. Urinary output is 75cc for the past 4 hours. Also pt's HR is having brief, nonsustained rates of 140-150s with flutter waves noted on telemetry. Within a 10 minute span pt's HR dropped as low as 40s with what appeared to be bigimeny. NP on call made aware and orders received. Will continue to monitor.

## 2015-04-21 NOTE — Progress Notes (Signed)
Shift event: RN has paged secondary to pt being tachycardic with rhythm of ST. Also, hypotensive. Bolus of NS 500cc x 2 given during shift. Later, pt with ectopy, so labs checked. Mg 1.8, K normal, Na up to 131 (was 125), LA 0.7. EKG noted with no tachycardia. CXR with ? pleural effusion.  At 0630, pt's BP up to 100s, HR 81.  Clance Boll, NP [facility]

## 2015-04-21 NOTE — Evaluation (Signed)
Clinical/Bedside Swallow Evaluation Patient Details  Name: Heidi [name] MRN: [id number] Date of Birth: 04/04/1941  Today's Date: 04/21/2015 Time: SLP Start Time (ACUTE ONLY): 1203 SLP Stop Time (ACUTE ONLY): 1220 SLP Time Calculation (min) (ACUTE ONLY): 17 min  Past Medical History:  Past Medical History  Diagnosis Date  . COPD (chronic obstructive pulmonary disease) ([place])   . Hypothyroidism   . 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 Hyperlipidemia   . Pulmonary nodule   . 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([place])   . Depression   . Cerebrovascular disease   . Positive ANA (antinuclear antibody)   . Diverticulosis   . Esophageal stricture   . Arthritis   . History of pneumonia   . History of stroke     mild oncoming  . Pneumonia     HX of  . Shortness of breath     periodically with COPD flair up  . Internal hemorrhoids   . Rectal ulcer   . Osteoporosis    Past Surgical History:  Past Surgical History  Procedure Laterality Date  . Hiatal hernia repair      x 2  . Cataract extraction      bilateral  . Total abdominal hysterectomy w/ bilateral salpingoophorectomy  1998  . Bladder surgery      tack  . Hemicolectomy  2012    emergent surgery [facility]  . Tonsillectomy    . Colonoscopy  06/22/2011    Procedure: COLONOSCOPY;  Surgeon: Lafayette Dragon, MD;  Location: [facility] ENDOSCOPY;  Service: Endoscopy;  Laterality: N/A;  . Gastric varices banding  06/22/2011    Procedure: HEMORRHOID BANDING;  Surgeon: Lafayette Dragon, MD;  Location: [facility] ENDOSCOPY;  Service: Endoscopy;  Laterality: N/A;   HPI:  74 year old female admitted 04/19/15 due to SOB and AMS. PMH significant for COPD, esophageal stricture, PNA. Orders received for BSE.   Assessment / Plan / Recommendation Clinical Impression  Pt presents with adequate oral motor strength and function. Lower dentures are ill-fitting. Pt exhibited no overt s/s aspiration of any consistency tested, however, pt has  increased risk of silent aspiration given hx COPD. Recommend MBS to objectively assess swallow function and safety.    Aspiration Risk  Moderate    Diet Recommendation NPO        Other  Recommendations Oral Care Recommendations: Oral care BID   Follow Up Recommendations       Frequency and Duration        Pertinent Vitals/Pain VSS, no pain reported    SLP Swallow Goals  pending MBS   Swallow Study Prior Functional Status   Hx COPD, no report of swallowing difficulty prior to admit.    General Date of Onset: 04/19/15 Other Pertinent Information: 74 year old female admitted 04/19/15 due to SOB and AMS. PMH significant for COPD, esophageal stricture, PNA. Orders received for BSE. Type of Study: Bedside swallow evaluation Previous Swallow Assessment: none found Diet Prior to this Study: NPO Temperature Spikes Noted: No Respiratory Status: Supplemental O2 delivered via (comment) (Heidi [name]@ 6L) History of Recent Intubation: No Behavior/Cognition: Alert;Cooperative;Pleasant mood Self-Feeding Abilities: Able to feed self Patient Positioning: Upright in bed Baseline Vocal Quality: Normal Volitional Cough: Congested Volitional Swallow: Able to elicit    Oral/Motor/Sensory Function Overall Oral Motor/Sensory Function: Appears within functional limits for tasks assessed   Ice Chips Ice chips: Within functional limits Presentation: Spoon   Thin Liquid Thin Liquid: Within functional limits Presentation:  Straw    Nectar Thick Nectar Thick Liquid: Not tested   Honey Thick Honey Thick Liquid: Not tested   Puree Puree: Within functional limits Presentation: Spoon   Solid   GO    Solid: Within functional limits Presentation: [name], Heidi [name] 04/21/2015,12:30 PM   Heidi [name]. Heidi [name] [facility] [place] [phone]

## 2015-04-21 NOTE — Progress Notes (Signed)
Initial Nutrition Assessment  DOCUMENTATION CODES:   Severe malnutrition in context of chronic illness, Underweight  INTERVENTION:  - Will order Magic Cup BID with meals, each supplement provides 290 kcal and 9 grams of protein - RD will continue to monitor for needs  NUTRITION DIAGNOSIS:   Inadequate oral intake related to inability to eat as evidenced by NPO status.  GOAL:   Patient will meet greater than or equal to 90% of their needs  MONITOR:   Diet advancement, Weight trends, Labs, I & O's  REASON FOR ASSESSMENT:   Malnutrition Screening Tool  ASSESSMENT:   74 y.o. female with a past medical history of chronic obstructive pulmonary disease, ongoing tobacco abuse, history of 4-5 mm pulmonary nodules seen on CT scan on 02/22/2015, brought to the emergency department by EMS. History was obtained from family members present at bedside and emergency room staff as she was unable to provide history given hypercarbia. Family members reporting that she has had progressive shortness of breath over the past 7-10 days associate with cough and brownish sputum production. During this time she has become progressively weaker and having poor tolerance to physical exertion. She currently resides at home with her daughter and son-in-law. In the last 24 hours she has had significant mental status changes becoming increasing confused and lethargic.   Pt seen for MST. BMI indicates underweight status. Pt has been NPO and is pending swallow evaluation. She states she is "starving" this AM and is very interested in receiving food. She states that PTA she had a very good appetite and ate a variety of foods but did not like items such as cookies, cakes, or pies.  She denies any chewing or swallowing issues with foods or liquids although she does state that family had to cut food into small, bite sized pieces in order for her to tolerate them well. She was not drinking nutrition supplements PTA.  Pt  reports UBW of 83-85 lbs and states that this has been her UBW for a long time. Per chart review, pt has lost 4 lbs (4.8% body weight) in the past 5 months which is not significant for time frame. Noted severe muscle and fat wasting to upper body; lower body not assessed.  RN reports SLP to evaluate today. He states pt was previously on BiPAP and ventimask and concern for her to be able to maintain her sats in order to stay on [place]. RD will continue to monitor. Noted that dysphagia 1, nectar-thick diet order placed @ 0930 today but no notes from SLP yet.   Not meeting needs. Medications reviewed. Labs reviewed; Na: 131 mmol/L, Cl: 86 mmol/L, creatinine low, Ca: 8.3 mg/dL.   Diet Order:  DIET - DYS 1 Room service appropriate?: Yes; Fluid consistency:: Nectar Thick  Skin:  Reviewed, no issues  Last BM:  10/3  Height:   Ht Readings from Last 1 Encounters:  04/19/15 5\' 3"  (1.6 m)    Weight:   Wt Readings from Last 1 Encounters:  04/21/15 80 lb 4 oz (36.4 kg)    Ideal Body Weight:  52.27 kg (kg)  BMI:  Body mass index is 14.22 kg/(m^2).  Estimated Nutritional Needs:   Kcal:  1000-1200  Protein:  35-45 grams  Fluid:  1.6-1.8 L/day  EDUCATION NEEDS:   No education needs identified at this time     Jarome Matin, RD, LDN Inpatient Clinical Dietitian Pager # [phone] After hours/weekend pager # [phone]

## 2015-04-21 NOTE — Progress Notes (Addendum)
[facility] PROGRESS NOTE  AVERIANA CLOUATRE VHQ:[id number] DOB: [birth year] DOA: 04/19/2015 PCP: Dorothyann Peng, NP  Brief Summary  Heidi [name] is a 74 y.o. female with a past medical history of chronic obstructive pulmonary disease, ongoing tobacco abuse, history of 4-5 mm pulmonary nodules seen on CT scan on 02/22/2015, brought to the emergency department by EMS. History was obtained from family members present at bedside and emergency room staff as she was unable to provide history given hypercarbia. Family members reporting that she has had progressive shortness of breath over the past 7-10 days associate with cough and brownish sputum production. During this time she has become progressively weaker and having poor tolerance to physical exertion. She currently resides at home with her daughter and son-in-law. Has been seen by PCCM and placed on Bipap.   Assessment/Plan  Acute hypercarbic and hypoxemic respiratory failure.  -transitioning to nasal canula -chronic obstructive pulmonary disease exacerbation and community acquired pneumonia are probable causes of her respiratory failure.  -elevated BNP of 1672.  -IV steroids and IV antibiotic therapy as below  Chronic obstructive pulmonary disease exacerbation. -continues to smoke around 2 packs of cigarettes daily -change to Solu-Medrol 60 mg IV every 8 hours -continue scheduled duo nebs along with empiric IV antimicrobial therapy. - palliative care consultation recommended by pulmonology  Community acquire pneumonia.  -ceftriaxone 1 g IV every 24 hours and azithromycin 500 mg IV every 24 hours x 3 doses.  - BCx NGTD - CXR demonstrates small bilateral pleural effusions left greater than right  Possible dysphagia vs. Severe cough -  Start dysphagia diet -  Speech therapy assessment  Possible acute on chronic diastolic heart failure due to IV fluids with bilateral effusions and interstitial edema on chest x-ray -  BNP 1672.   -  Patient responded to IV boluses overnight for hypotension and tachycardia so will hold off on diuretics for now  Elevated troponin.  -Initial lab showed a troponin of 0.25. Her EKG did not reveal acute ischemic changes.  -echo:  Normal systolic function with ejection fraction of 60-65% with no regional wall motion abnormalities, grade 1 diastolic dysfunction - Given severity of pulmonary disease, limited prognosis, not a good candidate for stents or surgeries  Possible a-fib but I suspect that she has intermittent episodic MAT, bigeminy  Unable to capture on 12-lead. -  Continue to address respiratory issues -  Will try to get 12-lead if tachycardia recurs  Hyponatremia.  -trend  Hypothyroidism. -Prior lab work has shown an elevated TSH of 32.19 on 02/17/2015.  - TSH 2.8 currently -  Resume oral synthroid  Severe protein calorie malnutrition -  Appreciate nutrition assistance -  Supplements ordered  Diet:  Dysphagia  Access:  PICC line in right arm IVF:  off Proph:  lovenox  Code Status: full Family Communication: patient alone Disposition Plan: pending further improvement in breathing   Consultants:  Critical care  Palliative care  Procedures:  bipap  Antibiotics:  Ceftriaxone 10/3 >   Azithromycin 10/3 >   HPI/Subjective:  States that her breathing is much better.  She is still SOB at rest, but able to talk some on nasal canula.  Hungry and wants to eat.      Objective: Filed Vitals:   04/21/15 1000 04/21/15 1100 04/21/15 1140 04/21/15 1200  BP: 97/43 96/39 115/51 113/50  Pulse: 84 81 87 87  Temp:    97.5 F (36.4 C)  TempSrc:    Axillary  Resp: 12 14 19  18  Height:      Weight:      SpO2: 97% 95% 93% 93%    Intake/Output Summary (Last 24 hours) at 04/21/15 1408 Last data filed at 04/21/15 1000  Gross per 24 hour  Intake   1205 ml  Output    750 ml  Net    455 ml   Filed Weights   04/19/15 1815 04/20/15 0500 04/21/15 0500   Weight: 36.6 kg (80 lb 11 oz) 36.4 kg (80 lb 4 oz) 36.4 kg (80 lb 4 oz)   Body mass index is 14.22 kg/(m^2).  Exam:   General:  Cachectic female, tachypneic, speaking in Krystine Pabst sentences  HEENT:  NCAT, MMM  Cardiovascular:  RRR, nl S1, S2 no mrg, 2+ pulses, warm extremities  Respiratory:  Diminished at bilateral bases with course rales at bases, LUL clear but RUL diminished  Abdomen:   NABS, soft, NT/ND  MSK:   Normal tone and bulk, no LEE  Neuro:  Grossly intact  Data Reviewed: Basic Metabolic Panel:  Recent Labs Lab 04/19/15 1359 04/19/15 1850 04/20/15 0618 04/21/15 0524  NA 125*  --  129* 131*  K 4.4  --  4.7 4.0  CL 80*  --  81* 86*  CO2 35*  --  33* 36*  GLUCOSE 143*  --  96 113*  BUN 13  --  18 15  CREATININE 0.53 0.71 0.57 0.39*  CALCIUM 8.4*  --  8.6* 8.3*  MG  --   --  1.8 1.8  PHOS  --   --  3.4  --    Liver Function Tests:  Recent Labs Lab 04/19/15 1359 04/20/15 0618  AST 21  --   ALT 10*  --   ALKPHOS 92  --   BILITOT 0.7  --   PROT 6.4*  --   ALBUMIN 3.0* 2.8*   No results for input(s): LIPASE, AMYLASE in the last 168 hours. No results for input(s): AMMONIA in the last 168 hours. CBC:  Recent Labs Lab 04/19/15 1359 04/19/15 1850 04/20/15 0618 04/21/15 0524  WBC 15.2* 12.6* 14.5* 7.9  NEUTROABS 14.2*  --  13.0*  --   HGB 15.1* 15.9* 15.1* 12.6  HCT 46.0 49.0* 45.1 39.1  MCV 89.8 89.7 87.4 89.5  PLT 244 196 192 190    Recent Results (from the past 240 hour(s))  MRSA PCR Screening     Status: None   Collection Time: 04/19/15  6:07 PM  Result Value Ref Range Status   MRSA by PCR NEGATIVE NEGATIVE Final    Comment:        The GeneXpert MRSA Assay (FDA approved for NASAL specimens only), is one component of a comprehensive MRSA colonization surveillance program. It is not intended to diagnose MRSA infection nor to guide or monitor treatment for MRSA infections.   Culture, blood (x 2)     Status: None (Preliminary result)    Collection Time: 04/19/15  6:35 PM  Result Value Ref Range Status   Specimen Description BLOOD LEFT FOREARM  Final   Special Requests IN PEDIATRIC BOTTLE 3 ML  Final   Culture   Final    NO GROWTH 2 DAYS Performed at [facility]    Report Status PENDING  Incomplete  Culture, blood (x 2)     Status: None (Preliminary result)   Collection Time: 04/19/15  6:50 PM  Result Value Ref Range Status   Specimen Description BLOOD LEFT ARM  Final  Special Requests IN PEDIATRIC BOTTLE 2 ML  Final   Culture   Final    NO GROWTH 2 DAYS Performed at [facility]    Report Status PENDING  Incomplete  Culture, sputum-assessment     Status: None   Collection Time: 04/20/15  9:18 PM  Result Value Ref Range Status   Specimen Description SPUTUM  Final   Special Requests Normal  Final   Sputum evaluation   Final    THIS SPECIMEN IS ACCEPTABLE. RESPIRATORY CULTURE REPORT TO FOLLOW.   Report Status 04/20/2015 FINAL  Final     Studies: Dg Chest Port 1 View  04/21/2015   CLINICAL DATA:  Acute onset of shortness of breath. Respiratory failure. Initial encounter.  EXAM: PORTABLE CHEST 1 VIEW  COMPARISON:  Chest radiograph performed 04/20/2015  FINDINGS: The lungs are hyperexpanded, with flattening of the hemidiaphragms, compatible with COPD. Small bilateral pleural effusions are seen, left greater than right. Vascular congestion is noted, with increased interstitial markings, raising concern for mild interstitial edema, superimposed on chronic lung disease. No pneumothorax is seen.  The cardiomediastinal silhouette is borderline enlarged. A right PICC is seen ending about the distal SVC. No acute osseous abnormalities are seen.  IMPRESSION: 1. Small bilateral pleural effusions, left greater than right, with underlying vascular congestion and borderline cardiomegaly. Increased interstitial markings raise concern for mild interstitial edema. 2. Findings of COPD.   Electronically Signed   By:  Garald Balding M.D.   On: 04/21/2015 06:07   Portable Chest 1 View  04/20/2015   CLINICAL DATA:  Sepsis.  EXAM: PORTABLE CHEST 1 VIEW  COMPARISON:  04/19/2015, 12/22/2009.  CT 02/22/2015.  FINDINGS: Mediastinum hilar structures normal. Persistent diffuse bilateral interstitial infiltrates again noted. Left base subsegmental atelectasis. Cardiomegaly with normal pulmonary vascularity. No pleural effusion or pneumothorax.  IMPRESSION: Persistent diffuse interstitial infiltrates with left base subsegmental atelectasis.   Electronically Signed   By: Marcello Moores  Register   On: 04/20/2015 07:10   Dg Chest Port 1 View  04/19/2015   CLINICAL DATA:  Sepsis  EXAM: PORTABLE CHEST 1 VIEW  COMPARISON:  1130 hours  FINDINGS: Heterogeneous opacities throughout both lungs are again noted. There has been some clearing at the lung bases. Heart remains enlarged. No pneumothorax.  IMPRESSION: Improved basilar opacities. Opacities throughout the remainder of the lungs persist.   Electronically Signed   By: Marybelle Killings M.D.   On: 04/19/2015 20:20    Scheduled Meds: . antiseptic oral rinse  7 mL Mouth Rinse q12n4p  . aspirin EC  81 mg Oral Daily  . azithromycin  500 mg Intravenous Q24H  . cefTRIAXone (ROCEPHIN)  IV  1 g Intravenous Q24H  . enoxaparin (LOVENOX) injection  30 mg Subcutaneous Q24H  . furosemide  40 mg Intravenous Daily  . guaiFENesin  1,200 mg Oral BID  . ipratropium-albuterol  3 mL Nebulization Q4H  . levothyroxine  68 mcg Intravenous Daily  . methylPREDNISolone (SOLU-MEDROL) injection  60 mg Intravenous 4 times per day  . sodium chloride  10-40 mL Intracatheter Q12H   Continuous Infusions:   Principal Problem:   Sepsis ([place]) Active Problems:   Hypothyroidism   Acute respiratory failure with hypoxia and hypercarbia (HCC)   COPD exacerbation (HCC)   CAP (community acquired pneumonia)   Tobacco abuse    Time spent: 30 min    Merary Garguilo, [facility] Pager [phone]. If 7PM-7AM,  please contact night-coverage at [email or web address], password [facility] 04/21/2015, 2:08 PM  LOS: 2 days

## 2015-04-22 DIAGNOSIS — A419 Sepsis, unspecified organism: Principal | ICD-10-CM

## 2015-04-22 DIAGNOSIS — Z515 Encounter for palliative care: Secondary | ICD-10-CM

## 2015-04-22 DIAGNOSIS — J189 Pneumonia, unspecified organism: Secondary | ICD-10-CM

## 2015-04-22 DIAGNOSIS — E43 Unspecified severe protein-calorie malnutrition: Secondary | ICD-10-CM

## 2015-04-22 LAB — RESPIRATORY VIRUS PANEL
ADENOVIRUS: NEGATIVE
INFLUENZA A: NEGATIVE
Influenza B: NEGATIVE
Metapneumovirus: NEGATIVE
PARAINFLUENZA 2 A: NEGATIVE
Parainfluenza 1: NEGATIVE
Parainfluenza 3: NEGATIVE
RESPIRATORY SYNCYTIAL VIRUS A: NEGATIVE
RESPIRATORY SYNCYTIAL VIRUS B: NEGATIVE
RHINOVIRUS: POSITIVE — AB

## 2015-04-22 MED ORDER — IPRATROPIUM-ALBUTEROL 0.5-2.5 (3) MG/3ML IN SOLN
3.0000 mL | Freq: Four times a day (QID) | RESPIRATORY_TRACT | Status: DC
  Administered 2015-04-23 – 2015-04-27 (×18): 3 mL via RESPIRATORY_TRACT
  Filled 2015-04-22 (×18): qty 3

## 2015-04-22 MED ORDER — BUDESONIDE 0.25 MG/2ML IN SUSP
0.2500 mg | Freq: Two times a day (BID) | RESPIRATORY_TRACT | Status: DC
  Administered 2015-04-22 – 2015-04-27 (×10): 0.25 mg via RESPIRATORY_TRACT
  Filled 2015-04-22 (×10): qty 2

## 2015-04-22 MED ORDER — ARFORMOTEROL TARTRATE 15 MCG/2ML IN NEBU
15.0000 ug | INHALATION_SOLUTION | Freq: Two times a day (BID) | RESPIRATORY_TRACT | Status: DC
  Administered 2015-04-22 – 2015-04-27 (×10): 15 ug via RESPIRATORY_TRACT
  Filled 2015-04-22 (×12): qty 2

## 2015-04-22 MED ORDER — MIRTAZAPINE 7.5 MG PO TABS
7.5000 mg | ORAL_TABLET | Freq: Every day | ORAL | Status: DC
  Administered 2015-04-22 – 2015-04-26 (×5): 7.5 mg via ORAL
  Filled 2015-04-22 (×7): qty 1

## 2015-04-22 MED ORDER — ALPRAZOLAM 0.5 MG PO TABS
0.5000 mg | ORAL_TABLET | Freq: Three times a day (TID) | ORAL | Status: DC | PRN
  Administered 2015-04-22 – 2015-04-26 (×3): 0.5 mg via ORAL
  Filled 2015-04-22 (×3): qty 1

## 2015-04-22 NOTE — Progress Notes (Signed)
Date:  Oct. 06, 2016 U.R. performed for needs and level of care. Fi02 at 55% venti mask Will continue to follow for Case Management needs.  Velva Harman, RN, BSN, [place]   [phone]

## 2015-04-22 NOTE — Consult Note (Addendum)
Consultation Note Date: 04/23/2015   Patient Name: Heidi [name]  DOB: 05/20/1941  MRN: [id number]  Age / Sex: 74 y.o., female   PCP: Dorothyann Peng, NP Referring Physician: Janece Canterbury, MD  Reason for Consultation: Establishing goals of care and Other  Palliative Care Assessment and Plan Summary of Established Goals of Care and Medical Treatment Preferences  Met with Natalye and her Bethel Born (daughter in law).  8 yo with COPD and unintentional weight loss/malnutrition. Admitted with hypoxemia respiratory failure-possible atypical PNA. Difficulty weaning off high O2 requirement.  Very complex home situation and recent family stressors.  Recent family deaths  One daughter who lives in the home has serious issues with substance abuse and has been stealing money from her motherIzora Gala expresses concerns that that daughter may have been giving her mother inappropriate unprescribed medication at night to sleep. I will obtain serum drug screen.May need to consider APS referral.  Issues with weight loss and depression.  DNR discussion and goals of care confirmed.  She wants full scope medical treatment for reversible disease but if she gets worse she would not want resuscitation or to be placed on a ventilator.  Started her on Mirtazapine QHS for depression, prn alprazolam for air hunger, Prostat nutrition supplement (or similar), nutrition consult, she is actively smoking-may need nicotine patch  She was taking excessive amounts of laxatives at home for an unknown reason per Nancy-she complains of diarrhea shortly after PO intake  Palliative will follow- reviewed her CT from 8/16.  Depending on how she does over the next few days she may benefit from hospice services at home- according to her HCPOA this has been a rapid decline over past month.  Prognosis: < 6 months  Discharge Planning:  Likely home- may need hospice services.      Chief Complaint/History of Present  Illness: Respiratory Failure  Primary Diagnoses  Present on Admission:  . Sepsis ([place]) . Acute respiratory failure with hypoxia and hypercarbia (HCC) . COPD exacerbation ([place]) . Hypothyroidism . CAP (community acquired pneumonia) . Tobacco abuse  Palliative Review of Systems: Patient c/o diarrhea, but she has been taking large amounts of laxatives. I have reviewed the medical record, interviewed the patient and family, and examined the patient. The following aspects are pertinent.  Past Medical History  Diagnosis Date  . COPD (chronic obstructive pulmonary disease) ([place])   . Hypothyroidism   . 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 Hyperlipidemia   . Pulmonary nodule   . 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([place])   . Depression   . Cerebrovascular disease   . Positive ANA (antinuclear antibody)   . Diverticulosis   . Esophageal stricture   . Arthritis   . History of pneumonia   . History of stroke     mild oncoming  . Pneumonia     HX of  . Shortness of breath     periodically with COPD flair up  . Internal hemorrhoids   . Rectal ulcer   . Osteoporosis    Social History   Social History  . Marital Status: Widowed    Spouse Name: N/A  . Number of Children: 3  . Years of Education: N/A   Occupational History  . RETIRED    Social History Main Topics  . Smoking status: Former Smoker    Quit date: 11/01/2009  . Smokeless tobacco: Never Used  . Alcohol Use: No  . Drug Use: No  .  Sexual Activity: Not Currently   Other Topics Concern  . None   Social History Narrative   Patient has no recent travel. She is smoking 2 packs per day and has smoked since age of 18. Has cats and dogs at home. No bird or mold exposure.    Family History  Problem Relation Age of Onset  . Colon cancer Mother   . COPD Father   . Heart attack Father   . Stroke Maternal Aunt   . Colon polyps Brother   . Liver disease Maternal Aunt   . Breast cancer Maternal Aunt   .  Hypertension Son   . Hypothyroidism Daughter    Scheduled Meds: . antiseptic oral rinse  7 mL Mouth Rinse q12n4p  . arformoterol  15 mcg Nebulization BID  . aspirin EC  81 mg Oral Daily  . azithromycin  500 mg Intravenous Q24H  . budesonide (PULMICORT) nebulizer solution  0.25 mg Nebulization BID  . cefTRIAXone (ROCEPHIN)  IV  1 g Intravenous Q24H  . docusate sodium  100 mg Oral BID  . enoxaparin (LOVENOX) injection  30 mg Subcutaneous Q24H  . guaiFENesin  1,200 mg Oral BID  . ipratropium-albuterol  3 mL Nebulization QID  . levothyroxine  137 mcg Oral QAC breakfast  . magnesium hydroxide  30 mL Oral Once  . methylPREDNISolone (SOLU-MEDROL) injection  60 mg Intravenous 3 times per day  . mirtazapine  7.5 mg Oral QHS  . [START ON 04/24/2015] polyethylene glycol  17 g Oral Daily  . senna  1 tablet Oral BID  . sodium chloride  10-40 mL Intracatheter Q12H   Continuous Infusions:  PRN Meds:.sodium chloride, acetaminophen **OR** acetaminophen, ALPRAZolam, lip balm, ondansetron **OR** ondansetron (ZOFRAN) IV, sodium chloride Medications Prior to Admission:  Prior to Admission medications   Medication Sig Start Date End Date Taking? Authorizing Provider  Fluticasone-Salmeterol (ADVAIR) 100-50 MCG/DOSE AEPB Inhale 1 puff into the lungs 2 (two) times daily. 12/15/14  Yes Dorothyann Peng, NP  levothyroxine (SYNTHROID, LEVOTHROID) 137 MCG tablet Take 1 tablet (137 mcg total) by mouth daily before breakfast. 02/18/15  Yes Dorothyann Peng, NP  loperamide (IMODIUM A-D) 2 MG tablet Take 1 tablet (2 mg total) by mouth 2 (two) times daily as needed for diarrhea or loose stools. 02/17/15  Yes Dorothyann Peng, NP  mirtazapine (REMERON) 15 MG tablet Take 1 tablet (15 mg total) by mouth at bedtime. 12/15/14  Yes Dorothyann Peng, NP  alendronate (FOSAMAX) 70 MG tablet Take 1 tablet (70 mg total) by mouth every 7 (seven) days. Take with a full glass of water on an empty stomach. Patient not taking: Reported on 04/19/2015  02/11/15   Dorothyann Peng, NP  halobetasol (ULTRAVATE) 0.05 % ointment Apply topically 2 (two) times daily.      Historical Provider, MD  tiotropium (SPIRIVA HANDIHALER) 18 MCG inhalation capsule Place 1 capsule (18 mcg total) into inhaler and inhale daily. Patient not taking: Reported on 02/17/2015 11/20/12   Kennyth Arnold, FNP   Allergies  Allergen Reactions  . Penicillins Hives    Has patient had a PCN reaction causing immediate rash, facial/tongue/throat swelling, SOB or lightheadedness with hypotension: No Has patient had a PCN reaction causing severe rash involving mucus membranes or skin necrosis: No Has patient had a PCN reaction that required hospitalization No Has patient had a PCN reaction occurring within the last 10 years: No If all of the above answers are "NO", then may proceed with Cephalosporin use.   CBC:  Component Value Date/Time   WBC 9.1 04/23/2015 0500   HGB 12.2 04/23/2015 0500   HCT 41.4 04/23/2015 0500   PLT 209 04/23/2015 0500   MCV 95.4 04/23/2015 0500   NEUTROABS 13.0* 04/20/2015 0618   LYMPHSABS 0.8 04/20/2015 0618   MONOABS 0.7 04/20/2015 0618   EOSABS 0.0 04/20/2015 0618   BASOSABS 0.0 04/20/2015 0618   Comprehensive Metabolic Panel:    Component Value Date/Time   NA 134* 04/23/2015 0500   K 4.6 04/23/2015 0500   CL 86* 04/23/2015 0500   CO2 46* 04/23/2015 0500   BUN 15 04/23/2015 0500   CREATININE 0.37* 04/23/2015 0500   GLUCOSE 128* 04/23/2015 0500   GLUCOSE 84 06/01/2006 0958   CALCIUM 8.5* 04/23/2015 0500   AST 21 04/19/2015 1359   ALT 10* 04/19/2015 1359   ALKPHOS 92 04/19/2015 1359   BILITOT 0.7 04/19/2015 1359   PROT 6.4* 04/19/2015 1359   ALBUMIN 2.8* 04/20/2015 0618    Physical Exam: Vital Signs: BP 106/54 mmHg  Pulse 76  Temp(Src) 98.6 F (37 C) (Oral)  Resp 11  Ht _0  (1.6 m)  Wt 36.8 kg (81 lb 2.1 oz)  BMI 14.38 kg/m2  SpO2 90% SpO2: SpO2: 90 % O2 Device: O2 Device: Nasal Cannula O2 Flow Rate: O2 Flow Rate (L/min):  6 L/min Intake/output summary:  Intake/Output Summary (Last 24 hours) at 04/23/15 0910 Last data filed at 04/23/15 0600  Gross per 24 hour  Intake    540 ml  Output    750 ml  Net   -210 ml   LBM: Last BM Date: 04/19/15 Baseline Weight: Weight: 36.6 kg (80 lb 11 oz) Most recent weight: Weight: 36.8 kg (81 lb 2.1 oz)  Exam Findings:  Extremely frail but mental status is very good. Full capacity. +tachypnea, +anxiety         Palliative Performance Scale: 40%           Additional Data Reviewed: Recent Labs     04/21/15  0524  04/23/15  0500  WBC  7.9  9.1  HGB  12.6  12.2  PLT  190  209  NA  131*  134*  BUN  15  15  CREATININE  0.39*  0.37*     Time In: 11AM Time Out: 12:10AM Time Total: 70 min Greater than 50%  of this time was spent counseling and coordinating care related to the above assessment and plan.  Signed by: Roma Schanz, DO  04/23/2015, 9:10 AM  Please contact Palliative Medicine Team phone at [phone] for questions and concerns.

## 2015-04-22 NOTE — Progress Notes (Signed)
[facility] PROGRESS NOTE  NOBUKO GSELL ACZ:[id number] DOB: 1941/01/13 DOA: 04/19/2015 PCP: Dorothyann Peng, NP  Brief Summary  Heidi [name] is a 74 y.o. female with a past medical history of chronic obstructive pulmonary disease, ongoing tobacco abuse, history of 4-5 mm pulmonary nodules seen on CT scan on 02/22/2015, brought to the emergency department by EMS. History was obtained from family members present at bedside and emergency room staff as she was unable to provide history given hypercarbia. Family members reporting that she has had progressive shortness of breath over the 7-10 days prior to admission with cough productive of brownish sputum production. She has become progressively weaker and having poor tolerance to physical exertion. She currently resides at home with her daughter and son-in-law.  She was seen by PCCM and placed on Bipap and admitted to the stepdown unit.     Assessment/Plan  Acute hypercarbic and hypoxemic respiratory failure due to acute COPD exacerbation and CAP - continue to attempt to transition to nasal canula  -IV steroids and IV antibiotic therapy as below  Chronic obstructive pulmonary disease exacerbation. - continues to smoke around 2 packs of cigarettes daily - continue Solu-Medrol 60 mg IV every 8 hours today - start budesonide and brovana -  Continue guaifenesin - continue scheduled duo nebs along with empiric IV antimicrobial therapy. - palliative care consultation recommended by pulmonology > to see patient possibly today.  Need Code status and hospice assessment   Community acquire pneumonia.  - Ceftriaxone 1 g IV every 24 hours -  Completed 1.5gm of azithromycin - BCx NGTD - CXR demonstrates small bilateral pleural effusions left greater than right -  Sputum culture:  Normal flora -  S. pneumo neg, Legionella pending  Possible dysphagia vs. Severe cough -  Speech therapy assessment - recommend regular diet with thin, no signs of  aspiration  Possible acute on chronic diastolic heart failure due to IV fluids with bilateral effusions and interstitial edema on chest x-ray -  BNP 1672.  -  Patient responded to IV boluses overnight for hypotension and tachycardia so will hold off on diuretics for now  Elevated troponin.  - Initial lab showed a troponin of 0.25. Her EKG did not reveal acute ischemic changes.  - Echo:  Normal systolic function with ejection fraction of 60-65% with no regional wall motion abnormalities, grade 1 diastolic dysfunction - Given severity of pulmonary disease, limited prognosis, not a good candidate for stents or surgeries  Possible a-fib but I suspect that she has intermittent episodic MAT, bigeminy  Unable to capture on 12-lead. -  Continue to address respiratory issues -  Will try to get 12-lead if tachycardia recurs -  Sinus rhythm on telemetry over last 24 hours  Hyponatremia.  - hold diuretics, trend  Hypothyroidism. -Prior lab work has shown an elevated TSH of 32.19 on 02/17/2015.  - TSH 2.8 currently -  Resume oral synthroid  Severe protein calorie malnutrition -  Appreciate nutrition assistance -  Supplements ordered  Diet:  Dysphagia  Access:  PICC line in right arm IVF:  off Proph:  lovenox  Code Status: full Family Communication: patient alone Disposition Plan: pending further improvement in breathing.  Transfer to telemetry once down closer to 40% venti mask   Consultants:  Critical care  Palliative care  Procedures:  bipap  Antibiotics:  Ceftriaxone 10/3 > 10/5  Azithromycin 10/3 >   HPI/Subjective:  States that her breathing is much better.  She continues to feel like she  has severe chest congestion that she is having a hard time coughing up  Objective: Filed Vitals:   04/22/15 0743 04/22/15 0800 04/22/15 0900 04/22/15 1000  BP:  99/74 113/62 124/52  Pulse:  89 91 91  Temp:  97.5 F (36.4 C)    TempSrc:  Oral    Resp:  20 17 19   Height:       Weight:      SpO2: 95% 91% 93% 89%    Intake/Output Summary (Last 24 hours) at 04/22/15 1248 Last data filed at 04/22/15 6720  Gross per 24 hour  Intake    625 ml  Output   2150 ml  Net  -1525 ml   Filed Weights   04/20/15 0500 04/21/15 0500 04/22/15 0500  Weight: 36.4 kg (80 lb 4 oz) 36.4 kg (80 lb 4 oz) 36.6 kg (80 lb 11 oz)   Body mass index is 14.3 kg/(m^2).  Exam:   General:  Cachectic female, speaking in full sentences  HEENT:  NCAT, MMM  Cardiovascular:  RRR, nl S1, S2 no mrg, 2+ pulses, warm extremities  Respiratory:  Diminished at bilateral bases with course rales at bases, LUL clear but RUL diminished  Abdomen:   NABS, soft, NT/ND  MSK:   Normal tone and bulk, no LEE  Neuro:  Grossly intact  Data Reviewed: Basic Metabolic Panel:  Recent Labs Lab 04/19/15 1359 04/19/15 1850 04/20/15 0618 04/21/15 0524  NA 125*  --  129* 131*  K 4.4  --  4.7 4.0  CL 80*  --  81* 86*  CO2 35*  --  33* 36*  GLUCOSE 143*  --  96 113*  BUN 13  --  18 15  CREATININE 0.53 0.71 0.57 0.39*  CALCIUM 8.4*  --  8.6* 8.3*  MG  --   --  1.8 1.8  PHOS  --   --  3.4  --    Liver Function Tests:  Recent Labs Lab 04/19/15 1359 04/20/15 0618  AST 21  --   ALT 10*  --   ALKPHOS 92  --   BILITOT 0.7  --   PROT 6.4*  --   ALBUMIN 3.0* 2.8*   No results for input(s): LIPASE, AMYLASE in the last 168 hours. No results for input(s): AMMONIA in the last 168 hours. CBC:  Recent Labs Lab 04/19/15 1359 04/19/15 1850 04/20/15 0618 04/21/15 0524  WBC 15.2* 12.6* 14.5* 7.9  NEUTROABS 14.2*  --  13.0*  --   HGB 15.1* 15.9* 15.1* 12.6  HCT 46.0 49.0* 45.1 39.1  MCV 89.8 89.7 87.4 89.5  PLT 244 196 192 190    Recent Results (from the past 240 hour(s))  MRSA PCR Screening     Status: None   Collection Time: 04/19/15  6:07 PM  Result Value Ref Range Status   MRSA by PCR NEGATIVE NEGATIVE Final    Comment:        The GeneXpert MRSA Assay (FDA approved for NASAL  specimens only), is one component of a comprehensive MRSA colonization surveillance program. It is not intended to diagnose MRSA infection nor to guide or monitor treatment for MRSA infections.   Culture, blood (x 2)     Status: None (Preliminary result)   Collection Time: 04/19/15  6:35 PM  Result Value Ref Range Status   Specimen Description BLOOD LEFT FOREARM  Final   Special Requests IN PEDIATRIC BOTTLE 3 ML  Final   Culture   Final  NO GROWTH 3 DAYS Performed at [facility]    Report Status PENDING  Incomplete  Culture, blood (x 2)     Status: None (Preliminary result)   Collection Time: 04/19/15  6:50 PM  Result Value Ref Range Status   Specimen Description BLOOD LEFT ARM  Final   Special Requests IN PEDIATRIC BOTTLE 2 ML  Final   Culture   Final    NO GROWTH 3 DAYS Performed at [facility]    Report Status PENDING  Incomplete  Culture, sputum-assessment     Status: None   Collection Time: 04/20/15  9:18 PM  Result Value Ref Range Status   Specimen Description SPUTUM  Final   Special Requests Normal  Final   Sputum evaluation   Final    THIS SPECIMEN IS ACCEPTABLE. RESPIRATORY CULTURE REPORT TO FOLLOW.   Report Status 04/20/2015 FINAL  Final  Culture, respiratory (NON-Expectorated)     Status: None (Preliminary result)   Collection Time: 04/20/15  9:18 PM  Result Value Ref Range Status   Specimen Description SPUTUM  Final   Special Requests NONE  Final   Gram Stain   Final    RARE WBC PRESENT, PREDOMINANTLY PMN RARE SQUAMOUS EPITHELIAL CELLS PRESENT RARE GRAM POSITIVE COCCI IN PAIRS Performed at [facility]    Culture   Final    NORMAL OROPHARYNGEAL FLORA Performed at [facility]    Report Status PENDING  Incomplete     Studies: Dg Chest Port 1 View  04/21/2015   CLINICAL DATA:  Acute onset of shortness of breath. Respiratory failure. Initial encounter.  EXAM: PORTABLE CHEST 1 VIEW  COMPARISON:  Chest radiograph  performed 04/20/2015  FINDINGS: The lungs are hyperexpanded, with flattening of the hemidiaphragms, compatible with COPD. Small bilateral pleural effusions are seen, left greater than right. Vascular congestion is noted, with increased interstitial markings, raising concern for mild interstitial edema, superimposed on chronic lung disease. No pneumothorax is seen.  The cardiomediastinal silhouette is borderline enlarged. A right PICC is seen ending about the distal SVC. No acute osseous abnormalities are seen.  IMPRESSION: 1. Small bilateral pleural effusions, left greater than right, with underlying vascular congestion and borderline cardiomegaly. Increased interstitial markings raise concern for mild interstitial edema. 2. Findings of COPD.   Electronically Signed   By: Garald Balding M.D.   On: 04/21/2015 06:07   Dg Swallowing Func-speech Pathology  04/21/2015    Objective Swallowing Evaluation:    Patient Details  Name: JIZELLE CONKEY MRN: [id number] Date of Birth: 1941/04/28  Today's Date: 04/21/2015 Time: SLP Start Time (ACUTE ONLY): 1515-SLP Stop Time (ACUTE ONLY): 1530 SLP Time Calculation (min) (ACUTE ONLY): 15 min  Past Medical History:  Past Medical History  Diagnosis Date  . COPD (chronic obstructive pulmonary disease) ([place])   . Hypothyroidism   . 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 Hyperlipidemia   . Pulmonary nodule   . 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([place])   . Depression   . Cerebrovascular disease   . Positive ANA (antinuclear antibody)   . Diverticulosis   . Esophageal stricture   . Arthritis   . History of pneumonia   . History of stroke     mild oncoming  . Pneumonia     HX of  . Shortness of breath     periodically with COPD flair up  . Internal hemorrhoids   . Rectal ulcer   . Osteoporosis  Past Surgical History:  Past Surgical History  Procedure Laterality Date  . Hiatal hernia repair      x 2  . Cataract extraction      bilateral  . Total abdominal hysterectomy w/ bilateral  salpingoophorectomy  1998  . Bladder surgery      tack  . Hemicolectomy  2012    emergent surgery [place] [facility]  . Tonsillectomy    . Colonoscopy  06/22/2011    Procedure: COLONOSCOPY;  Surgeon: Lafayette Dragon, MD;  Location: [facility]  ENDOSCOPY;  Service: Endoscopy;  Laterality: N/A;  . Gastric varices banding  06/22/2011    Procedure: HEMORRHOID BANDING;  Surgeon: Lafayette Dragon, MD;  Location:  [facility] ENDOSCOPY;  Service: Endoscopy;  Laterality: N/A;   HPI:  Other Pertinent Information: 74 year old female admitted 04/19/15 due to  SOB and AMS. PMH significant for COPD, esophageal stricture, PNA. Orders  received for BSE, which revealed no overt s/s aspiration. However, given  history of COPD, objective study was warranted.  No Data Recorded  Assessment / Plan / Recommendation CHL IP CLINICAL IMPRESSIONS 04/21/2015  Therapy Diagnosis WFL  Clinical Impression Normal oropharyngeal swallow. No penetration,  aspiration, postswallow residue on any consistency. Recommend regular diet  and thin liquids. No follow up at this time.      CHL IP TREATMENT RECOMMENDATION 04/21/2015  Treatment Recommendations No treatment recommended at this time     CHL IP DIET RECOMMENDATION 04/21/2015  SLP Diet Recommendations Thin  Liquid Administration via (None)  Medication Administration Whole meds with liquid  Compensations Slow rate;Small sips/bites  Postural Changes and/or Swallow Maneuvers (None)     CHL IP OTHER RECOMMENDATIONS 04/21/2015  Recommended Consults (None)  Oral Care Recommendations Oral care BID  Other Recommendations (None)     No flowsheet data found.   No flowsheet data found.   Pertinent Vitals/Pain VSS, no pain reported    SLP Swallow Goals No flowsheet data found.  No flowsheet data found.    CHL IP REASON FOR REFERRAL 04/21/2015  Reason for Referral Objectively evaluate swallowing function     CHL IP ORAL PHASE 04/21/2015  Lips (None)  Tongue (None)  Mucous membranes (None)  Nutritional status (None)  Other (None)  Oxygen therapy  (None)  Oral Phase WFL  Oral - Pudding Teaspoon (None)  Oral - Pudding Cup (None)  Oral - Honey Teaspoon (None)  Oral - Honey Cup (None)  Oral - Honey Syringe (None)  Oral - Nectar Teaspoon (None)  Oral - Nectar Cup (None)  Oral - Nectar Straw (None)  Oral - Nectar Syringe (None)  Oral - Ice Chips (None)  Oral - Thin Teaspoon (None)  Oral - Thin Cup (None)  Oral - Thin Straw (None)  Oral - Thin Syringe (None)  Oral - Puree (None)  Oral - Mechanical Soft (None)  Oral - Regular (None)  Oral - Multi-consistency (None)  Oral - Pill (None)  Oral Phase - Comment (None)      CHL IP PHARYNGEAL PHASE 04/21/2015  Pharyngeal Phase WFL  Pharyngeal - Pudding Teaspoon (None)  Penetration/Aspiration details (pudding teaspoon) (None)  Pharyngeal - Pudding Cup (None)  Penetration/Aspiration details (pudding cup) (None)  Pharyngeal - Honey Teaspoon (None)  Penetration/Aspiration details (honey teaspoon) (None)  Pharyngeal - Honey Cup (None)  Penetration/Aspiration details (honey cup) (None)  Pharyngeal - Honey Syringe (None)  Penetration/Aspiration details (honey syringe) (None)  Pharyngeal - Nectar Teaspoon (None)  Penetration/Aspiration details (nectar teaspoon) (None)  Pharyngeal - Nectar Cup (None)  Penetration/Aspiration details (nectar cup) (None)  Pharyngeal - Nectar Straw (None)  Penetration/Aspiration details (nectar straw) (None)  Pharyngeal - Nectar Syringe (None)  Penetration/Aspiration details (nectar syringe) (None)  Pharyngeal - Ice Chips (None)  Penetration/Aspiration details (ice chips) (None)  Pharyngeal - Thin Teaspoon (None)  Penetration/Aspiration details (thin teaspoon) (None)  Pharyngeal - Thin Cup (None)  Penetration/Aspiration details (thin cup) (None)  Pharyngeal - Thin Straw (None)  Penetration/Aspiration details (thin straw) (None)  Pharyngeal - Thin Syringe (None)  Penetration/Aspiration details (thin syringe') (None)  Pharyngeal - Puree (None)  Penetration/Aspiration details (puree) (None)  Pharyngeal -  Mechanical Soft (None)  Penetration/Aspiration details (mechanical soft) (None)  Pharyngeal - Regular (None)  Penetration/Aspiration details (regular) (None)  Pharyngeal - Multi-consistency (None)  Penetration/Aspiration details (multi-consistency) (None)  Pharyngeal - Pill (None)  Penetration/Aspiration details (pill) (None)  Pharyngeal Comment (None)      CHL IP CERVICAL ESOPHAGEAL PHASE 04/21/2015  Cervical Esophageal Phase WFL  Pudding Teaspoon (None)  Pudding Cup (None)  Honey Teaspoon (None)  Honey Cup (None)  Honey Straw (None)  Nectar Teaspoon (None)  Nectar Cup (None)  Nectar Straw (None)  Nectar Sippy Cup (None)  Thin Teaspoon (None)  Thin Cup (None)  Thin Straw (None)  Thin Sippy Cup (None)  Cervical Esophageal Comment (None)    No flowsheet data found.         Shonna Chock 04/21/2015, 3:45 PM  Celia B. Quentin Ore [facility], CCC-SLP [phone]   Scheduled Meds: . antiseptic oral rinse  7 mL Mouth Rinse q12n4p  . aspirin EC  81 mg Oral Daily  . azithromycin  500 mg Intravenous Q24H  . cefTRIAXone (ROCEPHIN)  IV  1 g Intravenous Q24H  . enoxaparin (LOVENOX) injection  30 mg Subcutaneous Q24H  . guaiFENesin  1,200 mg Oral BID  . ipratropium-albuterol  3 mL Nebulization Q4H  . levothyroxine  137 mcg Oral QAC breakfast  . methylPREDNISolone (SOLU-MEDROL) injection  60 mg Intravenous 3 times per day  . sodium chloride  10-40 mL Intracatheter Q12H   Continuous Infusions:   Principal Problem:   Sepsis ([place]) Active Problems:   Hypothyroidism   Acute respiratory failure with hypoxia and hypercarbia (HCC)   COPD exacerbation (HCC)   CAP (community acquired pneumonia)   Tobacco abuse   Protein-calorie malnutrition, severe    Time spent: 30 min    Samon Dishner, [facility] Pager [phone]. If 7PM-7AM, please contact night-coverage at [email or web address], password [email or web address] 04/22/2015, 12:48 PM  LOS: 3 days

## 2015-04-23 ENCOUNTER — Inpatient Hospital Stay (HOSPITAL_COMMUNITY): Payer: Medicare Other

## 2015-04-23 DIAGNOSIS — Z66 Do not resuscitate: Secondary | ICD-10-CM

## 2015-04-23 LAB — CBC
HEMATOCRIT: 41.4 % (ref 36.0–46.0)
HEMOGLOBIN: 12.2 g/dL (ref 12.0–15.0)
MCH: 28.1 pg (ref 26.0–34.0)
MCHC: 29.5 g/dL — AB (ref 30.0–36.0)
MCV: 95.4 fL (ref 78.0–100.0)
Platelets: 209 10*3/uL (ref 150–400)
RBC: 4.34 MIL/uL (ref 3.87–5.11)
RDW: 15.9 % — ABNORMAL HIGH (ref 11.5–15.5)
WBC: 9.1 10*3/uL (ref 4.0–10.5)

## 2015-04-23 LAB — BASIC METABOLIC PANEL
ANION GAP: 2 — AB (ref 5–15)
BUN: 15 mg/dL (ref 6–20)
CALCIUM: 8.5 mg/dL — AB (ref 8.9–10.3)
CHLORIDE: 86 mmol/L — AB (ref 101–111)
CO2: 46 mmol/L — AB (ref 22–32)
Creatinine, Ser: 0.37 mg/dL — ABNORMAL LOW (ref 0.44–1.00)
GFR calc non Af Amer: >60 mL/min (ref >60)
GLUCOSE: 128 mg/dL — AB (ref 65–99)
POTASSIUM: 4.6 mmol/L (ref 3.5–5.1)
Sodium: 134 mmol/L — ABNORMAL LOW (ref 135–145)

## 2015-04-23 LAB — CULTURE, RESPIRATORY

## 2015-04-23 LAB — CULTURE, RESPIRATORY W GRAM STAIN: Culture: NORMAL

## 2015-04-23 MED ORDER — DOCUSATE SODIUM 100 MG PO CAPS
100.0000 mg | ORAL_CAPSULE | Freq: Two times a day (BID) | ORAL | Status: DC
  Administered 2015-04-23 – 2015-04-27 (×5): 100 mg via ORAL
  Filled 2015-04-23 (×13): qty 1

## 2015-04-23 MED ORDER — MAGNESIUM HYDROXIDE 400 MG/5ML PO SUSP
30.0000 mL | Freq: Once | ORAL | Status: AC
  Administered 2015-04-23: 30 mL via ORAL
  Filled 2015-04-23: qty 30

## 2015-04-23 MED ORDER — SENNA 8.6 MG PO TABS
1.0000 | ORAL_TABLET | Freq: Two times a day (BID) | ORAL | Status: DC
  Administered 2015-04-23 – 2015-04-27 (×5): 8.6 mg via ORAL
  Filled 2015-04-23 (×7): qty 1

## 2015-04-23 MED ORDER — POLYETHYLENE GLYCOL 3350 17 G PO PACK
17.0000 g | PACK | Freq: Every day | ORAL | Status: DC
  Administered 2015-04-24 – 2015-04-27 (×2): 17 g via ORAL
  Filled 2015-04-23 (×5): qty 1

## 2015-04-23 NOTE — Progress Notes (Signed)
[facility] PROGRESS NOTE  Heidi [name] FSE:[id number] DOB: March 04, 1941 DOA: 04/19/2015 PCP: Dorothyann Peng, NP  Brief Summary  Heidi [name] is a 74 y.o. female with a past medical history of chronic obstructive pulmonary disease, ongoing tobacco abuse, history of 4-5 mm pulmonary nodules seen on CT scan on 02/22/2015, brought to the emergency department by EMS. History was obtained from family members present at bedside and emergency room staff as she was unable to provide history given hypercarbia. Family members reporting that she has had progressive shortness of breath over the 7-10 days prior to admission with cough productive of brownish sputum production. She has become progressively weaker and having poor tolerance to physical exertion. She currently resides at home with her daughter and son-in-law.  She was seen by PCCM and placed on Bipap and admitted to the stepdown unit.     Assessment/Plan  Acute hypercarbic and hypoxemic respiratory failure due to acute COPD exacerbation and CAP - continue to attempt to transition to nasal canula  - IV steroids and IV antibiotic therapy as below  Chronic obstructive pulmonary disease exacerbation. - continue Solu-Medrol 60 mg IV every 8 hours today - start budesonide and brovana - Continue guaifenesin - continue scheduled duo nebs along with empiric IV antimicrobial therapy - add flutter valve, OOB, up in chair, and PT/OT assessments - palliative care consultation recommended by pulmonology > to see patient possibly today.  Need Code status and hospice assessment   Community acquire pneumonia.  -  Resp viral panel:  Only rhinovirus positive > has had symptoms for longer than 7 days now so okay to d/c droplet  -  Ceftriaxone 1 g IV every 24 hours -  Completed 1.5gm of azithromycin -  BCx NGTD -  CXR demonstrates small bilateral pleural effusions left greater than right -  Sputum culture:  Normal flora -  S. pneumo neg, Legionella  neg  Possible acute on chronic diastolic heart failure due to IV fluids with bilateral effusions and interstitial edema on chest x-ray -  BNP 1672.  -  Repeat CXR this morning and consider low dose lasix trial if effusions worsening  Possible dysphagia vs. Severe cough -  Speech therapy assessment - recommend regular diet with thin, no signs of aspiration  Elevated troponin.  - Initial lab showed a troponin of 0.25. Her EKG did not reveal acute ischemic changes.  - Echo:  Normal systolic function with ejection fraction of 60-65% with no regional wall motion abnormalities, grade 1 diastolic dysfunction - Given severity of pulmonary disease, limited prognosis, not a good candidate for stents or surgeries  Possible a-fib but I suspect that she has intermittent episodic MAT, bigeminy  Unable to capture on 12-lead. -  Continue to address respiratory issues -  Will try to get 12-lead if tachycardia recurs -  Sinus rhythm on telemetry over last 24 hours  Hyponatremia.  - hold diuretics, trend  Hypothyroidism. -Prior lab work has shown an elevated TSH of 32.19 on 02/17/2015.  - TSH 2.8 currently -  Resume oral synthroid  Severe protein calorie malnutrition -  Appreciate nutrition assistance -  Supplements ordered  Constipation -  Start colace, senna, with a one time dose of milk of magnesia -  Start daily miralax tomorrow  Diet:  Dysphagia  Access:  PICC line in right arm IVF:  off Proph:  lovenox  Code Status: full Family Communication: patient alone Disposition Plan: pending further improvement in breathing   Consultants:  Critical care  Palliative care  Procedures:  bipap  CXR  Antibiotics:  Ceftriaxone 10/3 >   Azithromycin 10/3 > 10/5  HPI/Subjective:  Still with wet cough and difficulty getting secretions up.  No BM since admission.  Eating some.    Objective: Filed Vitals:   04/23/15 0300 04/23/15 0400 04/23/15 0500 04/23/15 0600  BP: 105/56  105/62  106/54  Pulse: 81 59  76  Temp:      TempSrc:      Resp: 14 16  11   Height:      Weight:   36.8 kg (81 lb 2.1 oz)   SpO2: 99% 94%  98%    Intake/Output Summary (Last 24 hours) at 04/23/15 0759 Last data filed at 04/23/15 0600  Gross per 24 hour  Intake    540 ml  Output    750 ml  Net   -210 ml   Filed Weights   04/21/15 0500 04/22/15 0500 04/23/15 0500  Weight: 36.4 kg (80 lb 4 oz) 36.6 kg (80 lb 11 oz) 36.8 kg (81 lb 2.1 oz)   Body mass index is 14.38 kg/(m^2).  Exam:   General:  Cachectic female, course rhonchorous cough  HEENT:  NCAT, MMM  Cardiovascular:  RRR, nl S1, S2 no mrg, 2+ pulses, warm extremities  Respiratory:  Diminished at bilateral bases with course rales at bases,very rhonchorous today  Abdomen:   NABS, soft, NT/ND  MSK:   Normal tone and bulk, no LEE  Neuro:  Grossly intact  Data Reviewed: Basic Metabolic Panel:  Recent Labs Lab 04/19/15 1359 04/19/15 1850 04/20/15 0618 04/21/15 0524 04/23/15 0500  NA 125*  --  129* 131* 134*  K 4.4  --  4.7 4.0 4.6  CL 80*  --  81* 86* 86*  CO2 35*  --  33* 36* 46*  GLUCOSE 143*  --  96 113* 128*  BUN 13  --  18 15 15   CREATININE 0.53 0.71 0.57 0.39* 0.37*  CALCIUM 8.4*  --  8.6* 8.3* 8.5*  MG  --   --  1.8 1.8  --   PHOS  --   --  3.4  --   --    Liver Function Tests:  Recent Labs Lab 04/19/15 1359 04/20/15 0618  AST 21  --   ALT 10*  --   ALKPHOS 92  --   BILITOT 0.7  --   PROT 6.4*  --   ALBUMIN 3.0* 2.8*   No results for input(s): LIPASE, AMYLASE in the last 168 hours. No results for input(s): AMMONIA in the last 168 hours. CBC:  Recent Labs Lab 04/19/15 1359 04/19/15 1850 04/20/15 0618 04/21/15 0524 04/23/15 0500  WBC 15.2* 12.6* 14.5* 7.9 9.1  NEUTROABS 14.2*  --  13.0*  --   --   HGB 15.1* 15.9* 15.1* 12.6 12.2  HCT 46.0 49.0* 45.1 39.1 41.4  MCV 89.8 89.7 87.4 89.5 95.4  PLT 244 196 192 190 209    Recent Results (from the past 240 hour(s))  MRSA PCR  Screening     Status: None   Collection Time: 04/19/15  6:07 PM  Result Value Ref Range Status   MRSA by PCR NEGATIVE NEGATIVE Final    Comment:        The GeneXpert MRSA Assay (FDA approved for NASAL specimens only), is one component of a comprehensive MRSA colonization surveillance program. It is not intended to diagnose MRSA infection nor to guide or monitor treatment for MRSA infections.  Culture, blood (x 2)     Status: None (Preliminary result)   Collection Time: 04/19/15  6:35 PM  Result Value Ref Range Status   Specimen Description BLOOD LEFT FOREARM  Final   Special Requests IN PEDIATRIC BOTTLE 3 ML  Final   Culture   Final    NO GROWTH 3 DAYS Performed at [facility]    Report Status PENDING  Incomplete  Culture, blood (x 2)     Status: None (Preliminary result)   Collection Time: 04/19/15  6:50 PM  Result Value Ref Range Status   Specimen Description BLOOD LEFT ARM  Final   Special Requests IN PEDIATRIC BOTTLE 2 ML  Final   Culture   Final    NO GROWTH 3 DAYS Performed at [facility]    Report Status PENDING  Incomplete  Respiratory virus panel     Status: Abnormal   Collection Time: 04/19/15  8:32 PM  Result Value Ref Range Status   Respiratory Syncytial Virus A Negative Negative Final   Respiratory Syncytial Virus B Negative Negative Final   Influenza A Negative Negative Final   Influenza B Negative Negative Final   Parainfluenza 1 Negative Negative Final   Parainfluenza 2 Negative Negative Final   Parainfluenza 3 Negative Negative Final   Metapneumovirus Negative Negative Final   Rhinovirus Positive (A) Negative Final   Adenovirus Negative Negative Final    Comment: (NOTE) Performed At: [facility], [place] [id number] Lindon Romp MD UU:[id number]   Culture, sputum-assessment     Status: None   Collection Time: 04/20/15  9:18 PM  Result Value Ref Range Status   Specimen Description SPUTUM  Final    Special Requests Normal  Final   Sputum evaluation   Final    THIS SPECIMEN IS ACCEPTABLE. RESPIRATORY CULTURE REPORT TO FOLLOW.   Report Status 04/20/2015 FINAL  Final  Culture, respiratory (NON-Expectorated)     Status: None (Preliminary result)   Collection Time: 04/20/15  9:18 PM  Result Value Ref Range Status   Specimen Description SPUTUM  Final   Special Requests NONE  Final   Gram Stain   Final    RARE WBC PRESENT, PREDOMINANTLY PMN RARE SQUAMOUS EPITHELIAL CELLS PRESENT RARE GRAM POSITIVE COCCI IN PAIRS Performed at [facility]    Culture   Final    NORMAL OROPHARYNGEAL FLORA Performed at [facility]    Report Status PENDING  Incomplete     Studies: Dg Swallowing Func-speech Pathology  04/21/2015    Objective Swallowing Evaluation:    Patient Details  Name: Heidi [name] MRN: [id number] Date of Birth: December 24, 1940  Today's Date: 04/21/2015 Time: SLP Start Time (ACUTE ONLY): 1515-SLP Stop Time (ACUTE ONLY): 1530 SLP Time Calculation (min) (ACUTE ONLY): 15 min  Past Medical History:  Past Medical History  Diagnosis Date  . COPD (chronic obstructive pulmonary disease) ([place])   . Hypothyroidism   . 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 Hyperlipidemia   . Pulmonary nodule   . 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([place])   . Depression   . Cerebrovascular disease   . Positive ANA (antinuclear antibody)   . Diverticulosis   . Esophageal stricture   . Arthritis   . History of pneumonia   . History of stroke     mild oncoming  . Pneumonia     HX of  . Shortness of breath  periodically with COPD flair up  . Internal hemorrhoids   . Rectal ulcer   . Osteoporosis    Past Surgical History:  Past Surgical History  Procedure Laterality Date  . Hiatal hernia repair      x 2  . Cataract extraction      bilateral  . Total abdominal hysterectomy w/ bilateral salpingoophorectomy  1998  . Bladder surgery      tack  . Hemicolectomy  2012    emergent surgery [facility]  .  Tonsillectomy    . Colonoscopy  06/22/2011    Procedure: COLONOSCOPY;  Surgeon: Lafayette Dragon, MD;  Location: [facility]  ENDOSCOPY;  Service: Endoscopy;  Laterality: N/A;  . Gastric varices banding  06/22/2011    Procedure: HEMORRHOID BANDING;  Surgeon: Lafayette Dragon, MD;  Location:  [facility] ENDOSCOPY;  Service: Endoscopy;  Laterality: N/A;   HPI:  Other Pertinent Information: 74 year old female admitted 04/19/15 due to  SOB and AMS. PMH significant for COPD, esophageal stricture, PNA. Orders  received for BSE, which revealed no overt s/s aspiration. However, given  history of COPD, objective study was warranted.  No Data Recorded  Assessment / Plan / Recommendation CHL IP CLINICAL IMPRESSIONS 04/21/2015  Therapy Diagnosis WFL  Clinical Impression Normal oropharyngeal swallow. No penetration,  aspiration, postswallow residue on any consistency. Recommend regular diet  and thin liquids. No follow up at this time.      CHL IP TREATMENT RECOMMENDATION 04/21/2015  Treatment Recommendations No treatment recommended at this time     CHL IP DIET RECOMMENDATION 04/21/2015  SLP Diet Recommendations Thin  Liquid Administration via (None)  Medication Administration Whole meds with liquid  Compensations Slow rate;Small sips/bites  Postural Changes and/or Swallow Maneuvers (None)     CHL IP OTHER RECOMMENDATIONS 04/21/2015  Recommended Consults (None)  Oral Care Recommendations Oral care BID  Other Recommendations (None)     No flowsheet data found.   No flowsheet data found.   Pertinent Vitals/Pain VSS, no pain reported    SLP Swallow Goals No flowsheet data found.  No flowsheet data found.    CHL IP REASON FOR REFERRAL 04/21/2015  Reason for Referral Objectively evaluate swallowing function     CHL IP ORAL PHASE 04/21/2015  Lips (None)  Tongue (None)  Mucous membranes (None)  Nutritional status (None)  Other (None)  Oxygen therapy (None)  Oral Phase WFL  Oral - Pudding Teaspoon (None)  Oral - Pudding Cup (None)  Oral - Honey Teaspoon (None)  Oral  - Honey Cup (None)  Oral - Honey Syringe (None)  Oral - Nectar Teaspoon (None)  Oral - Nectar Cup (None)  Oral - Nectar Straw (None)  Oral - Nectar Syringe (None)  Oral - Ice Chips (None)  Oral - Thin Teaspoon (None)  Oral - Thin Cup (None)  Oral - Thin Straw (None)  Oral - Thin Syringe (None)  Oral - Puree (None)  Oral - Mechanical Soft (None)  Oral - Regular (None)  Oral - Multi-consistency (None)  Oral - Pill (None)  Oral Phase - Comment (None)      CHL IP PHARYNGEAL PHASE 04/21/2015  Pharyngeal Phase WFL  Pharyngeal - Pudding Teaspoon (None)  Penetration/Aspiration details (pudding teaspoon) (None)  Pharyngeal - Pudding Cup (None)  Penetration/Aspiration details (pudding cup) (None)  Pharyngeal - Honey Teaspoon (None)  Penetration/Aspiration details (honey teaspoon) (None)  Pharyngeal - Honey Cup (None)  Penetration/Aspiration details (honey cup) (None)  Pharyngeal - Honey Syringe (None)  Penetration/Aspiration details (  honey syringe) (None)  Pharyngeal - Nectar Teaspoon (None)  Penetration/Aspiration details (nectar teaspoon) (None)  Pharyngeal - Nectar Cup (None)  Penetration/Aspiration details (nectar cup) (None)  Pharyngeal - Nectar Straw (None)  Penetration/Aspiration details (nectar straw) (None)  Pharyngeal - Nectar Syringe (None)  Penetration/Aspiration details (nectar syringe) (None)  Pharyngeal - Ice Chips (None)  Penetration/Aspiration details (ice chips) (None)  Pharyngeal - Thin Teaspoon (None)  Penetration/Aspiration details (thin teaspoon) (None)  Pharyngeal - Thin Cup (None)  Penetration/Aspiration details (thin cup) (None)  Pharyngeal - Thin Straw (None)  Penetration/Aspiration details (thin straw) (None)  Pharyngeal - Thin Syringe (None)  Penetration/Aspiration details (thin syringe') (None)  Pharyngeal - Puree (None)  Penetration/Aspiration details (puree) (None)  Pharyngeal - Mechanical Soft (None)  Penetration/Aspiration details (mechanical soft) (None)  Pharyngeal - Regular (None)   Penetration/Aspiration details (regular) (None)  Pharyngeal - Multi-consistency (None)  Penetration/Aspiration details (multi-consistency) (None)  Pharyngeal - Pill (None)  Penetration/Aspiration details (pill) (None)  Pharyngeal Comment (None)      CHL IP CERVICAL ESOPHAGEAL PHASE 04/21/2015  Cervical Esophageal Phase WFL  Pudding Teaspoon (None)  Pudding Cup (None)  Honey Teaspoon (None)  Honey Cup (None)  Honey Straw (None)  Nectar Teaspoon (None)  Nectar Cup (None)  Nectar Straw (None)  Nectar Sippy Cup (None)  Thin Teaspoon (None)  Thin Cup (None)  Thin Straw (None)  Thin Sippy Cup (None)  Cervical Esophageal Comment (None)    No flowsheet data found.         Shonna Chock 04/21/2015, 3:45 PM  Celia B. Quentin Ore [facility], CCC-SLP [phone]   Scheduled Meds: . antiseptic oral rinse  7 mL Mouth Rinse q12n4p  . arformoterol  15 mcg Nebulization BID  . aspirin EC  81 mg Oral Daily  . azithromycin  500 mg Intravenous Q24H  . budesonide (PULMICORT) nebulizer solution  0.25 mg Nebulization BID  . cefTRIAXone (ROCEPHIN)  IV  1 g Intravenous Q24H  . docusate sodium  100 mg Oral BID  . enoxaparin (LOVENOX) injection  30 mg Subcutaneous Q24H  . guaiFENesin  1,200 mg Oral BID  . ipratropium-albuterol  3 mL Nebulization QID  . levothyroxine  137 mcg Oral QAC breakfast  . magnesium hydroxide  30 mL Oral Once  . methylPREDNISolone (SOLU-MEDROL) injection  60 mg Intravenous 3 times per day  . mirtazapine  7.5 mg Oral QHS  . senna  1 tablet Oral BID  . sodium chloride  10-40 mL Intracatheter Q12H   Continuous Infusions:   Principal Problem:   Sepsis ([place]) Active Problems:   Hypothyroidism   Acute respiratory failure with hypoxia and hypercarbia (HCC)   COPD exacerbation (HCC)   CAP (community acquired pneumonia)   Tobacco abuse   Protein-calorie malnutrition, severe    Time spent: 30 min    Kaiana Marion, [place] [facility] Pager [phone]. If 7PM-7AM, please contact  night-coverage at [email or web address], password [facility] 04/23/2015, 7:59 AM  LOS: 4 days

## 2015-04-23 NOTE — Evaluation (Signed)
Physical Therapy Evaluation Patient Details Name: Heidi [name] MRN: [id number] DOB: 06/20/1941 Today's Date: 04/23/2015   History of Present Illness  Heidi [name] is a 74 y.o. female with a past medical history of chronic obstructive pulmonary disease, ongoing tobacco abuse, history of 4-5 mm pulmonary nodules seen on CT scan on 02/22/2015, brought to the emergency department by EMS on 04/19/15..  Family members reporting that she has had progressive shortness of breath over the 7-10 days prior to admission with cough productive of brownish sputum production. She has become progressively weaker and having poor tolerance to physical exertion.  Patient in acute hypercabic and hypoxic respiratory faiklure.  Clinical Impression  Patient with desaturation in bed, RN aware. Patient will benefit from PT to address problems listed in note below.     Follow Up Recommendations Home health PT;Supervision/Assistance - 24 hour    Equipment Recommendations   (tba)    Recommendations for Other Services       Precautions / Restrictions Precautions Precaution Comments: on high O2, monitor sats Restrictions Weight Bearing Restrictions: No      Mobility  Bed Mobility Overal bed mobility: Needs Assistance Bed Mobility: Supine to Sit;Sit to Supine     Supine to sit: Supervision Sit to supine: Supervision      Transfers Overall transfer level: Needs assistance Equipment used: Rolling walker (2 wheeled) Transfers: Sit to/from Stand Sit to Stand: Mod assist         General transfer comment: assist to power up from bed, stas on 6 l Dare 83-90. HR 100, RN aware  Ambulation/Gait                Stairs            Wheelchair Mobility    Modified Rankin (Stroke Patients Only)       Balance                                             Pertinent Vitals/Pain Pain Assessment: No/denies pain    Home Living Family/patient expects to be discharged to::  Private residence Living Arrangements: Children Available Help at Discharge: Family Type of Home: House Home Access: Stairs to enter   Technical brewer of Steps: a few Home Layout: One level Home Equipment: None      Prior Function Level of Independence: Independent               Hand Dominance        Extremity/Trunk Assessment   Upper Extremity Assessment: Generalized weakness           Lower Extremity Assessment: Generalized weakness      Cervical / Trunk Assessment: Kyphotic  Communication   Communication: No difficulties  Cognition Arousal/Alertness: Awake/alert Behavior During Therapy: WFL for tasks assessed/performed Overall Cognitive Status: Within Functional Limits for tasks assessed                      General Comments      Exercises        Assessment/Plan    PT Assessment Patient needs continued PT services  PT Diagnosis Difficulty walking;Generalized weakness   PT Problem List Decreased strength;Decreased activity tolerance;Cardiopulmonary status limiting activity;Decreased mobility  PT Treatment Interventions DME instruction;Gait training;Functional mobility training;Therapeutic activities;Therapeutic exercise   PT Goals (Current goals can be found in the Care Plan section) Acute  Rehab PT Goals Patient Stated Goal: to go back  home PT Goal Formulation: With patient Time For Goal Achievement: 05/07/15 Potential to Achieve Goals: Fair    Frequency Min 3X/week   Barriers to discharge        Co-evaluation               End of Session Equipment Utilized During Treatment: Oxygen Activity Tolerance: Treatment limited secondary to medical complications (Comment) Patient left: in bed;with call bell/phone within reach;with bed alarm set Nurse Communication: Mobility status         Time: 7903-8333 PT Time Calculation (min) (ACUTE ONLY): 17 min   Charges:   PT Evaluation $Initial PT Evaluation Tier I: 1  Procedure     PT G CodesClaretha [name] 04/23/2015, 5:04 PM

## 2015-04-24 ENCOUNTER — Other Ambulatory Visit (HOSPITAL_COMMUNITY): Payer: Medicare Other

## 2015-04-24 DIAGNOSIS — J90 Pleural effusion, not elsewhere classified: Secondary | ICD-10-CM

## 2015-04-24 LAB — BASIC METABOLIC PANEL
Anion gap: 7 (ref 5–15)
BUN: 20 mg/dL (ref 6–20)
CALCIUM: 8.6 mg/dL — AB (ref 8.9–10.3)
CHLORIDE: 87 mmol/L — AB (ref 101–111)
CO2: 43 mmol/L — AB (ref 22–32)
CREATININE: 0.55 mg/dL (ref 0.44–1.00)
GFR calc non Af Amer: >60 mL/min (ref >60)
Glucose, Bld: 262 mg/dL — ABNORMAL HIGH (ref 65–99)
Potassium: 4.3 mmol/L (ref 3.5–5.1)
SODIUM: 137 mmol/L (ref 135–145)

## 2015-04-24 LAB — DRUG SCREEN 10 W/CONF, SERUM
Amphetamines, IA: NEGATIVE ng/mL
BARBITURATES, IA: NEGATIVE ug/mL
Benzodiazepines, IA: NEGATIVE ng/mL
Cocaine & Metabolite, IA: NEGATIVE ng/mL
METHADONE, IA: NEGATIVE ng/mL
OXYCODONES, IA: NEGATIVE ng/mL
Opiates, IA: NEGATIVE ng/mL
Phencyclidine, IA: NEGATIVE ng/mL
Propoxyphene, IA: NEGATIVE ng/mL
THC(MARIJUANA) METABOLITE, IA: NEGATIVE ng/mL

## 2015-04-24 LAB — CULTURE, BLOOD (ROUTINE X 2)
Culture: NO GROWTH
Culture: NO GROWTH

## 2015-04-24 LAB — CBC
HEMATOCRIT: 44.4 % (ref 36.0–46.0)
Hemoglobin: 13.6 g/dL (ref 12.0–15.0)
MCH: 28.6 pg (ref 26.0–34.0)
MCHC: 30.6 g/dL (ref 30.0–36.0)
MCV: 93.3 fL (ref 78.0–100.0)
Platelets: 301 10*3/uL (ref 150–400)
RBC: 4.76 MIL/uL (ref 3.87–5.11)
RDW: 16 % — AB (ref 11.5–15.5)
WBC: 11.6 10*3/uL — ABNORMAL HIGH (ref 4.0–10.5)

## 2015-04-24 MED ORDER — FUROSEMIDE 10 MG/ML IJ SOLN
40.0000 mg | Freq: Once | INTRAMUSCULAR | Status: AC
  Administered 2015-04-24: 40 mg via INTRAVENOUS
  Filled 2015-04-24: qty 4

## 2015-04-24 NOTE — Progress Notes (Addendum)
[facility] PROGRESS NOTE  GIADA SCHOPPE FGH:[id number] DOB: 1940/09/09 DOA: 04/19/2015 PCP: Dorothyann Peng, NP  Brief Summary  Heidi [name] is a 74 y.o. female with a past medical history of chronic obstructive pulmonary disease, ongoing tobacco abuse, history of 4-5 mm pulmonary nodules seen on CT scan on 02/22/2015, brought to the emergency department by EMS. History was obtained from family members present at bedside and emergency room staff as she was unable to provide history given hypercarbia. Family members reporting that she has had progressive shortness of breath over the 7-10 days prior to admission with cough productive of brownish sputum production. She has become progressively weaker and having poor tolerance to physical exertion. She currently resides at home with her daughter and son-in-law.  She was seen by PCCM and placed on Bipap and admitted to the stepdown unit.     Assessment/Plan  Acute hypercarbic and hypoxemic respiratory failure due to acute COPD exacerbation and CAP - continue to attempt to transition to nasal canula  - IV steroids and IV antibiotic therapy as below  Chronic obstructive pulmonary disease exacerbation. - decrease to Solu-Medrol 60 mg IV BID - Continue budesonide and brovana - Continue guaifenesin - continue scheduled duo nebs along with empiric IV antimicrobial therapy - Continue flutter valve, OOB, up in chair, and PT/OT assessments -  Appreciate palliative care assistance  Community acquire pneumonia  -  Ceftriaxone 1 g IV every 24 hours, last day on 10/9. -  Completed 1.5gm of azithromycin -  BCx NGTD -  CXR demonstrates small bilateral pleural effusions left greater than right -  Sputum culture:  Normal flora -  S. pneumo neg, Legionella neg  Acute on chronic diastolic heart failure with worsening left effusion on CXR -  BNP 1672.  -  Repeat CXR:  Increased effusion -  Blood pressures are improved today, so will give lasix x  1 -  Spoke to patient about possible diagnostic/therapeutic thoracentesis and she would like to try thoracentesis to see if it may help her cough.    Possible dysphagia vs. Severe cough -  Speech therapy recommend regular diet with thin, no signs of aspiration  Elevated troponin.  - Initial lab showed a troponin of 0.25. Her EKG did not reveal acute ischemic changes.  - Echo:  Normal systolic function with ejection fraction of 60-65% with no regional wall motion abnormalities, grade 1 diastolic dysfunction - Given severity of pulmonary disease, limited prognosis, not a good candidate for stents or surgeries  Possible a-fib but I suspect that she has intermittent episodic MAT, bigeminy  Unable to capture on 12-lead. -  Continue to address respiratory issues -  Will try to get 12-lead if tachycardia recurs -  Sinus rhythm on telemetry over last 24 hours  Hyponatremia, resolved.   Hypothyroidism. -Prior lab work has shown an elevated TSH of 32.19 on 02/17/2015.  - TSH 2.8 currently -  Resume oral synthroid  Severe protein calorie malnutrition -  Appreciate nutrition assistance -  Supplements ordered  Constipation -  Continue colace, senna, daily miralax  Diet:  Dysphagia  Access:  PICC line in right arm IVF:  off Proph:  lovenox  Code Status: full Family Communication: patient alone Disposition Plan: pending further improvement in breathing   Consultants:  Critical care  Palliative care  Procedures:  bipap  CXR  Antibiotics:  Ceftriaxone 10/3 >   Azithromycin 10/3 > 10/5  HPI/Subjective:  Still with wet cough and difficulty getting secretions up.  No BM since admission.    Objective: Filed Vitals:   04/24/15 1025 04/24/15 1307 04/24/15 1408 04/24/15 1549  BP: 115/56  122/60   Pulse: 109  111   Temp: 98 F (36.7 C)  98.1 F (36.7 C)   TempSrc: Oral  Oral   Resp: 20  18   Height:      Weight:      SpO2: 88% 90% 90% 91%    Intake/Output  Summary (Last 24 hours) at 04/24/15 1807 Last data filed at 04/24/15 1748  Gross per 24 hour  Intake    620 ml  Output    250 ml  Net    370 ml   Filed Weights   04/21/15 0500 04/22/15 0500 04/23/15 0500  Weight: 36.4 kg (80 lb 4 oz) 36.6 kg (80 lb 11 oz) 36.8 kg (81 lb 2.1 oz)   Body mass index is 14.38 kg/(m^2).  Exam:   General:  Cachectic female, course rhonchorous cough  HEENT:  NCAT, MMM  Cardiovascular:  RRR  Respiratory:  Diminished at bilateral bases, absent at left base with course rales at bases, very rhonchorous  Abdomen:   NABS, soft, NT/ND  MSK:   Normal tone and bulk, no LEE  Neuro:  Grossly intact  Data Reviewed: Basic Metabolic Panel:  Recent Labs Lab 04/19/15 1359 04/19/15 1850 04/20/15 0618 04/21/15 0524 04/23/15 0500 04/24/15 0850  NA 125*  --  129* 131* 134* 137  K 4.4  --  4.7 4.0 4.6 4.3  CL 80*  --  81* 86* 86* 87*  CO2 35*  --  33* 36* 46* 43*  GLUCOSE 143*  --  96 113* 128* 262*  BUN 13  --  18 15 15 20   CREATININE 0.53 0.71 0.57 0.39* 0.37* 0.55  CALCIUM 8.4*  --  8.6* 8.3* 8.5* 8.6*  MG  --   --  1.8 1.8  --   --   PHOS  --   --  3.4  --   --   --    Liver Function Tests:  Recent Labs Lab 04/19/15 1359 04/20/15 0618  AST 21  --   ALT 10*  --   ALKPHOS 92  --   BILITOT 0.7  --   PROT 6.4*  --   ALBUMIN 3.0* 2.8*   No results for input(s): LIPASE, AMYLASE in the last 168 hours. No results for input(s): AMMONIA in the last 168 hours. CBC:  Recent Labs Lab 04/19/15 1359 04/19/15 1850 04/20/15 0618 04/21/15 0524 04/23/15 0500 04/24/15 0600  WBC 15.2* 12.6* 14.5* 7.9 9.1 11.6*  NEUTROABS 14.2*  --  13.0*  --   --   --   HGB 15.1* 15.9* 15.1* 12.6 12.2 13.6  HCT 46.0 49.0* 45.1 39.1 41.4 44.4  MCV 89.8 89.7 87.4 89.5 95.4 93.3  PLT 244 196 192 190 209 301    Recent Results (from the past 240 hour(s))  MRSA PCR Screening     Status: None   Collection Time: 04/19/15  6:07 PM  Result Value Ref Range Status    MRSA by PCR NEGATIVE NEGATIVE Final    Comment:        The GeneXpert MRSA Assay (FDA approved for NASAL specimens only), is one component of a comprehensive MRSA colonization surveillance program. It is not intended to diagnose MRSA infection nor to guide or monitor treatment for MRSA infections.   Culture, blood (x 2)     Status: None  Collection Time: 04/19/15  6:35 PM  Result Value Ref Range Status   Specimen Description BLOOD LEFT FOREARM  Final   Special Requests IN PEDIATRIC BOTTLE 3 ML  Final   Culture   Final    NO GROWTH 5 DAYS Performed at [facility]    Report Status 04/24/2015 FINAL  Final  Culture, blood (x 2)     Status: None   Collection Time: 04/19/15  6:50 PM  Result Value Ref Range Status   Specimen Description BLOOD LEFT ARM  Final   Special Requests IN PEDIATRIC BOTTLE 2 ML  Final   Culture   Final    NO GROWTH 5 DAYS Performed at [facility]    Report Status 04/24/2015 FINAL  Final  Respiratory virus panel     Status: Abnormal   Collection Time: 04/19/15  8:32 PM  Result Value Ref Range Status   Respiratory Syncytial Virus A Negative Negative Final   Respiratory Syncytial Virus B Negative Negative Final   Influenza A Negative Negative Final   Influenza B Negative Negative Final   Parainfluenza 1 Negative Negative Final   Parainfluenza 2 Negative Negative Final   Parainfluenza 3 Negative Negative Final   Metapneumovirus Negative Negative Final   Rhinovirus Positive (A) Negative Final   Adenovirus Negative Negative Final    Comment: (NOTE) Performed At: [facility] [place], [place] [id number] Lindon Romp MD ZH:[id number]   Culture, sputum-assessment     Status: None   Collection Time: 04/20/15  9:18 PM  Result Value Ref Range Status   Specimen Description SPUTUM  Final   Special Requests Normal  Final   Sputum evaluation   Final    THIS SPECIMEN IS ACCEPTABLE. RESPIRATORY CULTURE REPORT TO FOLLOW.    Report Status 04/20/2015 FINAL  Final  Culture, respiratory (NON-Expectorated)     Status: None   Collection Time: 04/20/15  9:18 PM  Result Value Ref Range Status   Specimen Description SPUTUM  Final   Special Requests NONE  Final   Gram Stain   Final    RARE WBC PRESENT, PREDOMINANTLY PMN RARE SQUAMOUS EPITHELIAL CELLS PRESENT RARE GRAM POSITIVE COCCI IN PAIRS Performed at [facility]    Culture   Final    NORMAL OROPHARYNGEAL FLORA Performed at [facility]    Report Status 04/23/2015 FINAL  Final     Studies: Dg Chest Port 1 View  04/23/2015   CLINICAL DATA:  Cough and hypoxia.  EXAM: PORTABLE CHEST 1 VIEW  COMPARISON:  April 21, 2015 and January 05, 2015  FINDINGS: There is underlying COPD. There is generalized interstitial edema with cardiomegaly and pulmonary venous hypertension. There are bilateral effusions, much larger on the left than on the right. There is consolidation in the left lower lobe. Central catheter tip is just beyond the cavoatrial junction in the right atrium. No pneumothorax.  IMPRESSION: Congestive heart failure superimposed on COPD. Moderate left effusion, larger compared to 2 days prior. Consolidation left lower lobe, likely superimposed pneumonia.   Electronically Signed   By: Lowella Grip III M.D.   On: 04/23/2015 08:45    Scheduled Meds: . antiseptic oral rinse  7 mL Mouth Rinse q12n4p  . arformoterol  15 mcg Nebulization BID  . aspirin EC  81 mg Oral Daily  . budesonide (PULMICORT) nebulizer solution  0.25 mg Nebulization BID  . cefTRIAXone (ROCEPHIN)  IV  1 g Intravenous Q24H  . docusate sodium  100  mg Oral BID  . enoxaparin (LOVENOX) injection  30 mg Subcutaneous Q24H  . guaiFENesin  1,200 mg Oral BID  . ipratropium-albuterol  3 mL Nebulization QID  . levothyroxine  137 mcg Oral QAC breakfast  . methylPREDNISolone (SOLU-MEDROL) injection  60 mg Intravenous 3 times per day  . mirtazapine  7.5 mg Oral QHS  . polyethylene  glycol  17 g Oral Daily  . senna  1 tablet Oral BID  . sodium chloride  10-40 mL Intracatheter Q12H   Continuous Infusions:   Principal Problem:   Sepsis ([place]) Active Problems:   Hypothyroidism   Acute respiratory failure with hypoxia and hypercarbia (HCC)   COPD exacerbation (HCC)   CAP (community acquired pneumonia)   Tobacco abuse   Protein-calorie malnutrition, severe   DNR (do not resuscitate)    Time spent: 30 min    Natia Fahmy, [facility] Pager [phone]. If 7PM-7AM, please contact night-coverage at [email or web address], password [facility] 04/24/2015, 6:07 PM  LOS: 5 days

## 2015-04-24 NOTE — Evaluation (Signed)
Occupational Therapy Evaluation Patient Details Name: Heidi [name] MRN: [id number] DOB: [birth year] Today's Date: 04/24/2015    History of Present Illness Heidi [name] is a 74 y.o. female with a past medical history of chronic obstructive pulmonary disease, ongoing tobacco abuse, history of 4-5 mm pulmonary nodules seen on CT scan on 02/22/2015, brought to the emergency department by EMS on 04/19/15..  Family members reporting that she has had progressive shortness of breath over the 7-10 days prior to admission with cough productive of brownish sputum production. She has become progressively weaker and having poor tolerance to physical exertion.  Patient in acute hypercabic and hypoxic respiratory faiklure.   Clinical Impression   Patient presenting with deconditioning secondary to above. Patient overall independent PTA. Patient currently functioning at an overall min assist level. Patient will benefit from acute OT to increase overall independence in the areas of ADLs, functional mobility, energy conservation, and overall safety in order to safely discharge home with [facility] and 24/7 supervision.   Pt on 6 L/min supplemental 02 during entire OT session. Patient's sats decreased to 84% after activity. Encouraged pursed lip breathing and within ~1 minute sats increased to 90%. Sats never got any higher than 90%. HR=up to 110.    Follow Up Recommendations  Home health OT;Supervision/Assistance - 24 hour    Equipment Recommendations  3 in 1 bedside comode    Recommendations for Other Services  None at this time   Precautions / Restrictions Precautions Precautions: Fall Precaution Comments: on high O2, monitor sats Restrictions Weight Bearing Restrictions: No    Mobility Bed Mobility Overal bed mobility: Needs Assistance Bed Mobility: Supine to Sit;Sit to Supine     Supine to sit: Supervision Sit to supine: Supervision   General bed mobility comments: supervision for  safety  Transfers Overall transfer level: Needs assistance Equipment used: None Transfers: Sit to/from [facility] Sit to Stand: Min assist Stand pivot transfers: Min assist General transfer comment: Min assist for safety due to no AD used. Cues for safety and correct technique.     Balance Overall balance assessment: Needs assistance Sitting-balance support: No upper extremity supported;Feet supported Sitting balance-Leahy Scale: Good     Standing balance support: No upper extremity supported;During functional activity Standing balance-Leahy Scale: Fair    ADL Overall ADL's : Needs assistance/impaired Eating/Feeding: Set up;Sitting   Grooming: Set up;Sitting   Upper Body Bathing: Set up;Sitting   Lower Body Bathing: Minimal assistance;Sit to/from stand   Upper Body Dressing : Set up;Sitting   Lower Body Dressing: Minimal assistance;Sit to/from stand   Toilet Transfer: Min guard;BSC;Stand-pivot   Toileting- Water quality scientist and Hygiene: Min guard;Sit to/from Nurse, children's Details (indicate cue type and reason): did not occur   General ADL Comments: Pt limtied by decreased 02 support. When patient's sats decreased to below 90%, therapist encouraged rest break and pursed lip breathing. Pt engaged in bed mobility and performed stand pivot transfer EOB to and from [facility].     Pertinent Vitals/Pain Pain Assessment: No/denies pain     Hand Dominance Right   Extremity/Trunk Assessment Upper Extremity Assessment Upper Extremity Assessment: Generalized weakness   Lower Extremity Assessment Lower Extremity Assessment: Generalized weakness   Cervical / Trunk Assessment Cervical / Trunk Assessment: Kyphotic   Communication Communication Communication: No difficulties   Cognition Arousal/Alertness: Awake/alert Behavior During Therapy: WFL for tasks assessed/performed Overall Cognitive Status: Within Functional Limits for tasks  assessed  Home Living Family/patient expects to be discharged to:: Private residence Living Arrangements: Children Available Help at Discharge: Family;Available 24 hours/day Type of Home: House Home Access: Stairs to enter [employer] of Steps: a few   Home Layout: One level     Bathroom Shower/Tub: Corporate investment banker: Standard     Home Equipment: Shower seat   Additional Comments: Lives with 5 family members and also has a few dogs & cats. Pt reports trying to get out of tub ~1 week ago and need a lot of assistance to get out.       Prior Functioning/Environment Level of Independence: Independent     OT Diagnosis: Generalized weakness   OT Problem List: Decreased strength;Decreased activity tolerance;Impaired balance (sitting and/or standing);Decreased knowledge of use of DME or AE;Cardiopulmonary status limiting activity;Decreased safety awareness   OT Treatment/Interventions: Self-care/ADL training;Therapeutic exercise;Energy conservation;DME and/or AE instruction;Therapeutic activities;Patient/family education;Balance training    OT Goals(Current goals can be found in the care plan section) Acute Rehab OT Goals Patient Stated Goal: to go back  home OT Goal Formulation: With patient Time For Goal Achievement: 05/08/15 Potential to Achieve Goals: Good ADL Goals Pt Will Perform Grooming: with modified independence;standing Pt Will Perform Lower Body Bathing: with modified independence;sit to/from stand Pt Will Perform Lower Body Dressing: with modified independence;sit to/from stand Pt Will Transfer to Toilet: with modified independence;bedside commode;ambulating Pt Will Perform Tub/Shower Transfer: Tub transfer;ambulating;3 in 1;rolling walker;with modified independence Additional ADL Goal #1: Pt will be mod I with functional mobility using RW prn  OT Frequency: Min 2X/week   Barriers to D/C: None known at this time    End of Session Nurse Communication: Mobility status  Activity Tolerance: Patient tolerated treatment well Patient left: in bed;with call bell/phone within reach   Time: 1242-1300 OT Time Calculation (min): 18 min Charges:  OT General Charges $OT Visit: 1 Procedure OT Evaluation $Initial OT Evaluation Tier I: 1 Procedure  Alahna Dunne , MS, OTR/L, CLT Pager: [phone]  04/24/2015, 1:25 PM

## 2015-04-25 ENCOUNTER — Inpatient Hospital Stay (HOSPITAL_COMMUNITY): Payer: Medicare Other

## 2015-04-25 ENCOUNTER — Other Ambulatory Visit: Payer: Self-pay | Admitting: Radiology

## 2015-04-25 DIAGNOSIS — I5033 Acute on chronic diastolic (congestive) heart failure: Secondary | ICD-10-CM

## 2015-04-25 DIAGNOSIS — J90 Pleural effusion, not elsewhere classified: Secondary | ICD-10-CM

## 2015-04-25 LAB — BASIC METABOLIC PANEL
Anion gap: 6 (ref 5–15)
BUN: 16 mg/dL (ref 6–20)
CO2: 45 mmol/L — ABNORMAL HIGH (ref 22–32)
Calcium: 8.4 mg/dL — ABNORMAL LOW (ref 8.9–10.3)
Chloride: 85 mmol/L — ABNORMAL LOW (ref 101–111)
Creatinine, Ser: 0.42 mg/dL — ABNORMAL LOW (ref 0.44–1.00)
Glucose, Bld: 95 mg/dL (ref 65–99)
POTASSIUM: 4.5 mmol/L (ref 3.5–5.1)
SODIUM: 136 mmol/L (ref 135–145)

## 2015-04-25 LAB — LACTATE DEHYDROGENASE, PLEURAL OR PERITONEAL FLUID: LD, Fluid: 69 U/L — ABNORMAL HIGH (ref 3–23)

## 2015-04-25 LAB — CBC
HEMATOCRIT: 43.3 % (ref 36.0–46.0)
Hemoglobin: 13.6 g/dL (ref 12.0–15.0)
MCH: 28.8 pg (ref 26.0–34.0)
MCHC: 31.4 g/dL (ref 30.0–36.0)
MCV: 91.5 fL (ref 78.0–100.0)
PLATELETS: 278 10*3/uL (ref 150–400)
RBC: 4.73 MIL/uL (ref 3.87–5.11)
RDW: 16 % — AB (ref 11.5–15.5)
WBC: 10.3 10*3/uL (ref 4.0–10.5)

## 2015-04-25 LAB — BODY FLUID CELL COUNT WITH DIFFERENTIAL
Eos, Fluid: 0 %
LYMPHS FL: 40 %
Monocyte-Macrophage-Serous Fluid: 26 % — ABNORMAL LOW (ref 50–90)
Neutrophil Count, Fluid: 34 % — ABNORMAL HIGH (ref 0–25)
Total Nucleated Cell Count, Fluid: 187 cu mm (ref 0–1000)

## 2015-04-25 LAB — PROTEIN, BODY FLUID

## 2015-04-25 LAB — GRAM STAIN

## 2015-04-25 LAB — GLUCOSE, SEROUS FLUID: GLUCOSE FL: 163 mg/dL

## 2015-04-25 NOTE — Progress Notes (Signed)
OT Cancellation Note  Patient Details Name: Heidi [name] MRN: [id number] DOB: 1940-10-26   Cancelled Treatment:    Reason Eval/Treat Not Completed: Patient at procedure or test/ unavailable. Pt currently down for thorocentesis. Will try back later today if schedule allows.  Almon Register [phone] 04/25/2015, 11:58 AM

## 2015-04-25 NOTE — Progress Notes (Signed)
[facility] PROGRESS NOTE  Heidi [name]:[id number] DOB: 06-14-1941 DOA: 04/19/2015 PCP: Dorothyann Peng, NP  Brief Summary  Heidi [name] is a 74 y.o. female with a past medical history of chronic obstructive pulmonary disease, ongoing tobacco abuse, history of 4-5 mm pulmonary nodules seen on CT scan on 02/22/2015, brought to the emergency department by EMS. History was obtained from family members present at bedside and emergency room staff as she was unable to provide history given hypercarbia. Family members reporting that she has had progressive shortness of breath over the 7-10 days prior to admission with cough productive of brownish sputum production. She has become progressively weaker and having poor tolerance to physical exertion. She currently resides at home with her daughter and son-in-law.  She was seen by PCCM and placed on Bipap and admitted to the stepdown unit.     Assessment/Plan  Acute hypercarbic and hypoxemic respiratory failure due to acute COPD exacerbation and CAP - continue to attempt to transition to nasal canula  - IV steroids and IV antibiotic therapy as below  Chronic obstructive pulmonary disease exacerbation. - continue Solu-Medrol 60 mg IV BID - Continue budesonide and brovana - Continue guaifenesin - continue scheduled duo nebs along with empiric IV antimicrobial therapy - Continue flutter valve, OOB, up in chair, and PT/OT assessments -  Appreciate palliative care assistance  Community acquire pneumonia  -  Ceftriaxone 1 g IV every 24 hours, last day on 10/9. -  Completed 1.5gm of azithromycin -  BCx NGTD -  CXR demonstrates small bilateral pleural effusions left greater than right -  Sputum culture:  Normal flora -  S. pneumo neg, Legionella neg  Acute on chronic diastolic heart failure with worsening left effusion on CXR -  BNP 1672.  -  Repeat CXR:  Increased effusion -  Blood pressures are improved today, so will give lasix x 1 -   Diagnostic and therapeutic thoracentesis today:  364mL fluid removed.  Greatly appreciate radiology assistance.  Possible dysphagia vs. Severe cough -  Speech therapy recommend regular diet with thin, no signs of aspiration  Elevated troponin.  - Initial lab showed a troponin of 0.25. Her EKG did not reveal acute ischemic changes.  - Echo:  Normal systolic function with ejection fraction of 60-65% with no regional wall motion abnormalities, grade 1 diastolic dysfunction - Given severity of pulmonary disease, limited prognosis, not a good candidate for stents or surgeries  Possible a-fib but I suspect that she has intermittent episodic MAT, bigeminy  Unable to capture on 12-lead. -  Continue to address respiratory issues -  Will try to get 12-lead if tachycardia recurs -  Sinus rhythm on telemetry over last 24 hours  Hyponatremia, resolved.   Hypothyroidism. -Prior lab work has shown an elevated TSH of 32.19 on 02/17/2015.  - TSH 2.8 currently -  Resume oral synthroid  Severe protein calorie malnutrition -  Appreciate nutrition assistance -  Supplements ordered  Constipation -  Continue colace, senna, daily miralax  Diet:  Dysphagia  Access:  PICC line in right arm IVF:  off Proph:  lovenox  Code Status:  DNR Family Communication: patient alone Disposition Plan:  Plan to d/c with hospice services after discussion today   Consultants:  Critical care  Palliative care  Procedures:  bipap  CXR  Antibiotics:  Ceftriaxone 10/3 > 10/9  Azithromycin 10/3 > 10/5  HPI/Subjective:  Still with wet cough but better today.  Voided frequently yesterday.  Pooping more regularly  now.    Objective: Filed Vitals:   04/25/15 1155 04/25/15 1243 04/25/15 1300 04/25/15 1400  BP: 143/69   116/55  Pulse:    98  Temp:    97.9 F (36.6 C)  TempSrc:    Oral  Resp:    22  Height:      Weight:      SpO2:  85% 91% 91%    Intake/Output Summary (Last 24 hours) at 04/25/15  1600 Last data filed at 04/25/15 1501  Gross per 24 hour  Intake    720 ml  Output      0 ml  Net    720 ml   Filed Weights   04/22/15 0500 04/23/15 0500 04/25/15 0846  Weight: 36.6 kg (80 lb 11 oz) 36.8 kg (81 lb 2.1 oz) 36.9 kg (81 lb 5.6 oz)   Body mass index is 14.41 kg/(m^2).  Exam:   General:  Cachectic female, course rhonchorous cough  HEENT:  NCAT, MMM  Cardiovascular:  RRR  Respiratory:  Diminished at bilateral bases, no wheezes, + rhonchi  Abdomen:   NABS, soft, NT/ND  MSK:   Normal tone and bulk, trace bilateral LEE  Neuro:  Grossly intact  Data Reviewed: Basic Metabolic Panel:  Recent Labs Lab 04/20/15 0618 04/21/15 0524 04/23/15 0500 04/24/15 0850 04/25/15 0615  NA 129* 131* 134* 137 136  K 4.7 4.0 4.6 4.3 4.5  CL 81* 86* 86* 87* 85*  CO2 33* 36* 46* 43* 45*  GLUCOSE 96 113* 128* 262* 95  BUN 18 15 15 20 16   CREATININE 0.57 0.39* 0.37* 0.55 0.42*  CALCIUM 8.6* 8.3* 8.5* 8.6* 8.4*  MG 1.8 1.8  --   --   --   PHOS 3.4  --   --   --   --    Liver Function Tests:  Recent Labs Lab 04/19/15 1359 04/20/15 0618  AST 21  --   ALT 10*  --   ALKPHOS 92  --   BILITOT 0.7  --   PROT 6.4*  --   ALBUMIN 3.0* 2.8*   No results for input(s): LIPASE, AMYLASE in the last 168 hours. No results for input(s): AMMONIA in the last 168 hours. CBC:  Recent Labs Lab 04/19/15 1359  04/20/15 0618 04/21/15 0524 04/23/15 0500 04/24/15 0600 04/25/15 0615  WBC 15.2*  < > 14.5* 7.9 9.1 11.6* 10.3  NEUTROABS 14.2*  --  13.0*  --   --   --   --   HGB 15.1*  < > 15.1* 12.6 12.2 13.6 13.6  HCT 46.0  < > 45.1 39.1 41.4 44.4 43.3  MCV 89.8  < > 87.4 89.5 95.4 93.3 91.5  PLT 244  < > 192 190 209 301 278  < > = values in this interval not displayed.  Recent Results (from the past 240 hour(s))  MRSA PCR Screening     Status: None   Collection Time: 04/19/15  6:07 PM  Result Value Ref Range Status   MRSA by PCR NEGATIVE NEGATIVE Final    Comment:        The  GeneXpert MRSA Assay (FDA approved for NASAL specimens only), is one component of a comprehensive MRSA colonization surveillance program. It is not intended to diagnose MRSA infection nor to guide or monitor treatment for MRSA infections.   Culture, blood (x 2)     Status: None   Collection Time: 04/19/15  6:35 PM  Result Value Ref Range Status  Specimen Description BLOOD LEFT FOREARM  Final   Special Requests IN PEDIATRIC BOTTLE 3 ML  Final   Culture   Final    NO GROWTH 5 DAYS Performed at [facility]    Report Status 04/24/2015 FINAL  Final  Culture, blood (x 2)     Status: None   Collection Time: 04/19/15  6:50 PM  Result Value Ref Range Status   Specimen Description BLOOD LEFT ARM  Final   Special Requests IN PEDIATRIC BOTTLE 2 ML  Final   Culture   Final    NO GROWTH 5 DAYS Performed at [facility]    Report Status 04/24/2015 FINAL  Final  Respiratory virus panel     Status: Abnormal   Collection Time: 04/19/15  8:32 PM  Result Value Ref Range Status   Respiratory Syncytial Virus A Negative Negative Final   Respiratory Syncytial Virus B Negative Negative Final   Influenza A Negative Negative Final   Influenza B Negative Negative Final   Parainfluenza 1 Negative Negative Final   Parainfluenza 2 Negative Negative Final   Parainfluenza 3 Negative Negative Final   Metapneumovirus Negative Negative Final   Rhinovirus Positive (A) Negative Final   Adenovirus Negative Negative Final    Comment: (NOTE) Performed At: [facility] [place], [place] [id number] Lindon Romp MD PF:[id number]   Culture, sputum-assessment     Status: None   Collection Time: 04/20/15  9:18 PM  Result Value Ref Range Status   Specimen Description SPUTUM  Final   Special Requests Normal  Final   Sputum evaluation   Final    THIS SPECIMEN IS ACCEPTABLE. RESPIRATORY CULTURE REPORT TO FOLLOW.   Report Status 04/20/2015 FINAL  Final  Culture,  respiratory (NON-Expectorated)     Status: None   Collection Time: 04/20/15  9:18 PM  Result Value Ref Range Status   Specimen Description SPUTUM  Final   Special Requests NONE  Final   Gram Stain   Final    RARE WBC PRESENT, PREDOMINANTLY PMN RARE SQUAMOUS EPITHELIAL CELLS PRESENT RARE GRAM POSITIVE COCCI IN PAIRS Performed at [facility]    Culture   Final    NORMAL OROPHARYNGEAL FLORA Performed at [facility]    Report Status 04/23/2015 FINAL  Final     Studies: Dg Chest 1 View  04/25/2015   CLINICAL DATA:  Status post left-sided thoracentesis.  EXAM: CHEST 1 VIEW  COMPARISON:  April 23, 2015.  FINDINGS: Stable cardiomegaly. Right-sided PICC line is unchanged in position. No pneumothorax is noted. Left pleural effusion is significantly smaller status post thoracentesis. Stable bibasilar edema or atelectasis is noted. Bony thorax is unremarkable.  IMPRESSION: Left pleural effusion is significantly smaller status post thoracentesis. No pneumothorax is noted.   Electronically Signed   By: Marijo Conception, M.D.   On: 04/25/2015 12:36    Scheduled Meds: . antiseptic oral rinse  7 mL Mouth Rinse q12n4p  . arformoterol  15 mcg Nebulization BID  . aspirin EC  81 mg Oral Daily  . budesonide (PULMICORT) nebulizer solution  0.25 mg Nebulization BID  . cefTRIAXone (ROCEPHIN)  IV  1 g Intravenous Q24H  . docusate sodium  100 mg Oral BID  . enoxaparin (LOVENOX) injection  30 mg Subcutaneous Q24H  . guaiFENesin  1,200 mg Oral BID  . ipratropium-albuterol  3 mL Nebulization QID  . levothyroxine  137 mcg Oral QAC breakfast  . methylPREDNISolone (SOLU-MEDROL) injection  60 mg  Intravenous 3 times per day  . mirtazapine  7.5 mg Oral QHS  . polyethylene glycol  17 g Oral Daily  . senna  1 tablet Oral BID  . sodium chloride  10-40 mL Intracatheter Q12H   Continuous Infusions:   Principal Problem:   Sepsis ([place]) Active Problems:   Hypothyroidism   Acute respiratory  failure with hypoxia and hypercarbia (HCC)   COPD exacerbation (HCC)   CAP (community acquired pneumonia)   Tobacco abuse   Protein-calorie malnutrition, severe   DNR (do not resuscitate)   Pleural effusion   Acute on chronic diastolic heart failure ([place])    Time spent: 30 min    Merridy Pascoe, [facility] Pager [phone]. If 7PM-7AM, please contact night-coverage at [email or web address], password [facility] 04/25/2015, 4:00 PM  LOS: 6 days

## 2015-04-25 NOTE — Care Management Note (Signed)
Case Management Note  Patient Details  Name: Heidi [name] MRN: [id number] Date of Birth: 09-Oct-1940  Subjective/Objective:                  chronic obstructive pulmonary disease  Action/Plan:  discharge planning  Expected Discharge Date:   (unknown)               Expected Discharge Plan:  Home w Hospice Care  In-House Referral:     Discharge planning Services  CM Consult  Post Acute Care Choice:    Choice offered to:     DME Arranged:    DME Agency:     HH Arranged:    HH Agency:     Status of Service:  In process, will continue to follow  Medicare Important Message Given:    Date Medicare IM Given:    Medicare IM give by:    Date Additional Medicare IM Given:    Additional Medicare Important Message give by:     If discussed at [place] of Stay Meetings, dates discussed:    Additional Comments: CM spoke with patient at the bedside. Patient states her plan is to be discharged home with Hospice. She was provided with a list of Home Hospice agencies. She will review the list with family. Patient will need a 3N1 when she is discharged home. Patient will inform CM when she has a decision for her HH. Will continue to follow.  Apolonio Schneiders, RN 04/25/2015, 10:41 AM

## 2015-04-25 NOTE — Progress Notes (Addendum)
Occupational Therapy Treatment Patient Details Name: Heidi [name] MRN: [id number] DOB: 1940-12-13 Today's Date: 04/25/2015    History of present illness Heidi [name] is a 74 y.o. female with a past medical history of chronic obstructive pulmonary disease, ongoing tobacco abuse, history of 4-5 mm pulmonary nodules seen on CT scan on 02/22/2015, brought to the emergency department by EMS on 04/19/15..  Family members reporting that she has had progressive shortness of breath over the 7-10 days prior to admission with cough productive of brownish sputum production. She has become progressively weaker and having poor tolerance to physical exertion.  Patient in acute hypercabic and hypoxic respiratory faiklure.   OT comments  This 74 yo female admitted with above presents to acute OT making progress, but still quite O2 dependent (90=96% on 5 liters), recommend someone be with her at home anytime she is up on her feet to help her manage her O2 tubing. Will continue to benefit from acute OT with follow up [place].  Follow Up Recommendations  Home health OT;Supervision/Assistance - 24 hour    Equipment Recommendations  3 in 1 bedside comode       Precautions / Restrictions Precautions Precautions: Fall Precaution Comments: on high O2, monitor sats (as high as 96% and as low as 90% on 5 liters of O2) Restrictions Weight Bearing Restrictions: No       Mobility Bed Mobility Overal bed mobility: Independent                Transfers Overall transfer level: Needs assistance Equipment used: Rolling walker (2 wheeled) Transfers: Sit to/from Stand Sit to Stand: Supervision         General transfer comment: VCs for safe hand placement    Balance Overall balance assessment: Needs assistance Sitting-balance support: No upper extremity supported;Feet supported Sitting balance-Leahy Scale: Good     Standing balance support: No upper extremity supported;During functional  activity Standing balance-Leahy Scale: Fair                     ADL Overall ADL's : Needs assistance/impaired     Grooming: Wash/dry hands;Set up;Supervision/safety;Standing                   Toilet Transfer: Supervision/safety;Ambulation;RW;Regular Toilet;Grab bars   Toileting- Clothing Manipulation and Hygiene: Supervision/safety;Sit to/from stand          Spoke with pt about possibly a tub bench for tub since she does report it is really hard for her to get out of tub, however pt prefers tub baths not showers, so tub benches/seats she is not interested in. Educated pt on purse lipped breathing and had her perform 2 sets of 5 reps with me during the session.      Vision                 Additional Comments: No change from baseline          Cognition   Behavior During Therapy: WFL for tasks assessed/performed Overall Cognitive Status: Within Functional Limits for tasks assessed                                    Pertinent Vitals/ Pain       Pain Assessment: No/denies pain         Frequency Min 2X/week     Progress Toward Goals  OT Goals(current goals can now be found in  the care plan section)  Progress towards OT goals: Progressing toward goals     Plan Discharge plan remains appropriate       End of Session Equipment Utilized During Treatment: Rolling walker;Oxygen (5 liters)   Activity Tolerance Patient tolerated treatment well (reports that she feels much better after thorocentesis earlier today)   Patient Left in bed;with call bell/phone within reach;with chair alarm set   Nurse Communication  (and NT: left O2 extension for pt to use so she can get up to bathroom with A of staff and keep O2 on)        Time: 1420-1448 OT Time Calculation (min): 28 min  Charges: OT General Charges $OT Visit: 1 Procedure OT Treatments $Self Care/Home Management : 23-37 mins  Almon Register [phone] 04/25/2015, 3:00  PM

## 2015-04-25 NOTE — Procedures (Signed)
US guided diagnostic/therapeutic left thoracentesis performed yielding 380 cc yellow fluid. The fluid was sent to the lab for preordered studies. F/u CXR pending. No immediate complications.

## 2015-04-26 LAB — BASIC METABOLIC PANEL
ANION GAP: 5 (ref 5–15)
BUN: 15 mg/dL (ref 6–20)
CO2: 45 mmol/L — AB (ref 22–32)
Calcium: 8.6 mg/dL — ABNORMAL LOW (ref 8.9–10.3)
Chloride: 88 mmol/L — ABNORMAL LOW (ref 101–111)
Creatinine, Ser: 0.32 mg/dL — ABNORMAL LOW (ref 0.44–1.00)
GFR calc Af Amer: >60 mL/min (ref >60)
GLUCOSE: 114 mg/dL — AB (ref 65–99)
POTASSIUM: 4.5 mmol/L (ref 3.5–5.1)
Sodium: 138 mmol/L (ref 135–145)

## 2015-04-26 LAB — CBC
HEMATOCRIT: 41.5 % (ref 36.0–46.0)
HEMOGLOBIN: 12.8 g/dL (ref 12.0–15.0)
MCH: 28.4 pg (ref 26.0–34.0)
MCHC: 30.8 g/dL (ref 30.0–36.0)
MCV: 92.2 fL (ref 78.0–100.0)
Platelets: 268 10*3/uL (ref 150–400)
RBC: 4.5 MIL/uL (ref 3.87–5.11)
RDW: 16.2 % — ABNORMAL HIGH (ref 11.5–15.5)
WBC: 7.9 10*3/uL (ref 4.0–10.5)

## 2015-04-26 MED ORDER — PREDNISONE 10 MG PO TABS
60.0000 mg | ORAL_TABLET | Freq: Every day | ORAL | Status: DC
  Administered 2015-04-26 – 2015-04-27 (×2): 60 mg via ORAL
  Filled 2015-04-26 (×3): qty 1

## 2015-04-26 NOTE — Progress Notes (Signed)
[facility] PROGRESS NOTE  HANAAN GANCARZ QIH:[id number] DOB: 17-Feb-1941 DOA: 04/19/2015 PCP: Dorothyann Peng, NP  Brief Summary  Heidi [name] is a 74 y.o. female with a past medical history of chronic obstructive pulmonary disease, ongoing tobacco abuse, history of 4-5 mm pulmonary nodules seen on CT scan on 02/22/2015, brought to the emergency department by EMS. History was obtained from family members present at bedside and emergency room staff as she was unable to provide history given hypercarbia. Family members reporting that she has had progressive shortness of breath over the 7-10 days prior to admission with cough productive of brownish sputum production. She has become progressively weaker and having poor tolerance to physical exertion. She currently resides at home with her daughter and son-in-law.  She was seen by PCCM and placed on Bipap and admitted to the stepdown unit.     Assessment/Plan  Acute hypercarbic and hypoxemic respiratory failure due to acute COPD exacerbation and CAP - continue to wean nasal canula as able -  Will need home O2  Chronic obstructive pulmonary disease exacerbation. - start prednisone - Continue budesonide and brovana - Continue guaifenesin - continue scheduled duo nebs along with empiric IV antimicrobial therapy - Continue flutter valve, OOB, up in chair, and PT/OT assessments -  Appreciate palliative care assistance  Community acquire pneumonia  -  Completed 7 days of ceftriaxone -  Completed 1.5gm of azithromycin -  BCx NGTD -  CXR demonstrates small bilateral pleural effusions left greater than right -  Sputum culture:  Normal flora -  S. pneumo neg, Legionella neg  Acute on chronic diastolic heart failure with worsening left effusion on CXR, feeling much better after thoracentesis on 10/9. -  BNP 1672.  -    Possible dysphagia vs. Severe cough -  Speech therapy recommend regular diet with thin, no signs of aspiration  Elevated  troponin.  - Initial lab showed a troponin of 0.25. Her EKG did not reveal acute ischemic changes.  - Echo:  Normal systolic function with ejection fraction of 60-65% with no regional wall motion abnormalities, grade 1 diastolic dysfunction - Given severity of pulmonary disease, limited prognosis, not a good candidate for stents or surgeries  Possible a-fib but I suspect that she has intermittent episodic MAT, bigeminy  Unable to capture on 12-lead. -  Continue to address respiratory issues -  Will try to get 12-lead if tachycardia recurs -  Sinus rhythm on telemetry over last 24 hours  Hyponatremia, resolved.   Hypothyroidism. -Prior lab work has shown an elevated TSH of 32.19 on 02/17/2015.  - TSH 2.8 currently -  Resume oral synthroid  Severe protein calorie malnutrition -  Appreciate nutrition assistance -  Supplements ordered  Constipation -  Continue colace, senna, daily miralax  Diet:  Dysphagia  Access:  PICC line in right arm IVF:  off Proph:  lovenox  Code Status:  DNR Family Communication: patient alone Disposition Plan:  Plan to d/c with hospice services Tues or Wed.  Need equipment set up   Consultants:  Critical care  Palliative care  Procedures:  bipap  CXR  Antibiotics:  Ceftriaxone 10/3 > 10/9  Azithromycin 10/3 > 10/5  HPI/Subjective:  Minimal cough today.  Feeling much better after thoracentesis which "didn't even hurt."    Objective: Filed Vitals:   04/26/15 0754 04/26/15 1142 04/26/15 1352 04/26/15 1551  BP:   127/59   Pulse:   106   Temp:   98.2 F (36.8 C)  TempSrc:   Oral   Resp:   15   Height:      Weight:      SpO2: 91% 84% 95% 90%    Intake/Output Summary (Last 24 hours) at 04/26/15 1843 Last data filed at 04/26/15 1814  Gross per 24 hour  Intake    390 ml  Output      0 ml  Net    390 ml   Filed Weights   04/22/15 0500 04/23/15 0500 04/25/15 0846  Weight: 36.6 kg (80 lb 11 oz) 36.8 kg (81 lb 2.1 oz) 36.9  kg (81 lb 5.6 oz)   Body mass index is 14.41 kg/(m^2).  Exam:   General:  Cachectic female, NAD  HEENT:  NCAT, MMM  Cardiovascular:  RRR  Respiratory:  Diminished at bilateral bases, no wheezes, or rhonchi.  Rales at left base  Abdomen:   NABS, soft, NT/ND  MSK:   Normal tone and bulk, trace bilateral LEE  Neuro:  Grossly intact  Data Reviewed: Basic Metabolic Panel:  Recent Labs Lab 04/20/15 0618 04/21/15 0524 04/23/15 0500 04/24/15 0850 04/25/15 0615 04/26/15 0540  NA 129* 131* 134* 137 136 138  K 4.7 4.0 4.6 4.3 4.5 4.5  CL 81* 86* 86* 87* 85* 88*  CO2 33* 36* 46* 43* 45* 45*  GLUCOSE 96 113* 128* 262* 95 114*  BUN 18 15 15 20 16 15   CREATININE 0.57 0.39* 0.37* 0.55 0.42* 0.32*  CALCIUM 8.6* 8.3* 8.5* 8.6* 8.4* 8.6*  MG 1.8 1.8  --   --   --   --   PHOS 3.4  --   --   --   --   --    Liver Function Tests:  Recent Labs Lab 04/20/15 0618  ALBUMIN 2.8*   No results for input(s): LIPASE, AMYLASE in the last 168 hours. No results for input(s): AMMONIA in the last 168 hours. CBC:  Recent Labs Lab 04/20/15 0618 04/21/15 0524 04/23/15 0500 04/24/15 0600 04/25/15 0615 04/26/15 0540  WBC 14.5* 7.9 9.1 11.6* 10.3 7.9  NEUTROABS 13.0*  --   --   --   --   --   HGB 15.1* 12.6 12.2 13.6 13.6 12.8  HCT 45.1 39.1 41.4 44.4 43.3 41.5  MCV 87.4 89.5 95.4 93.3 91.5 92.2  PLT 192 190 209 301 278 268    Recent Results (from the past 240 hour(s))  MRSA PCR Screening     Status: None   Collection Time: 04/19/15  6:07 PM  Result Value Ref Range Status   MRSA by PCR NEGATIVE NEGATIVE Final    Comment:        The GeneXpert MRSA Assay (FDA approved for NASAL specimens only), is one component of a comprehensive MRSA colonization surveillance program. It is not intended to diagnose MRSA infection nor to guide or monitor treatment for MRSA infections.   Culture, blood (x 2)     Status: None   Collection Time: 04/19/15  6:35 PM  Result Value Ref Range  Status   Specimen Description BLOOD LEFT FOREARM  Final   Special Requests IN PEDIATRIC BOTTLE 3 ML  Final   Culture   Final    NO GROWTH 5 DAYS Performed at [facility]    Report Status 04/24/2015 FINAL  Final  Culture, blood (x 2)     Status: None   Collection Time: 04/19/15  6:50 PM  Result Value Ref Range Status   Specimen Description BLOOD LEFT ARM  Final   Special Requests IN PEDIATRIC BOTTLE 2 ML  Final   Culture   Final    NO GROWTH 5 DAYS Performed at [facility]    Report Status 04/24/2015 FINAL  Final  Respiratory virus panel     Status: Abnormal   Collection Time: 04/19/15  8:32 PM  Result Value Ref Range Status   Respiratory Syncytial Virus A Negative Negative Final   Respiratory Syncytial Virus B Negative Negative Final   Influenza A Negative Negative Final   Influenza B Negative Negative Final   Parainfluenza 1 Negative Negative Final   Parainfluenza 2 Negative Negative Final   Parainfluenza 3 Negative Negative Final   Metapneumovirus Negative Negative Final   Rhinovirus Positive (A) Negative Final   Adenovirus Negative Negative Final    Comment: (NOTE) Performed At: [facility] [place], [place] [id number] Lindon Romp MD KW:[id number]   Culture, sputum-assessment     Status: None   Collection Time: 04/20/15  9:18 PM  Result Value Ref Range Status   Specimen Description SPUTUM  Final   Special Requests Normal  Final   Sputum evaluation   Final    THIS SPECIMEN IS ACCEPTABLE. RESPIRATORY CULTURE REPORT TO FOLLOW.   Report Status 04/20/2015 FINAL  Final  Culture, respiratory (NON-Expectorated)     Status: None   Collection Time: 04/20/15  9:18 PM  Result Value Ref Range Status   Specimen Description SPUTUM  Final   Special Requests NONE  Final   Gram Stain   Final    RARE WBC PRESENT, PREDOMINANTLY PMN RARE SQUAMOUS EPITHELIAL CELLS PRESENT RARE GRAM POSITIVE COCCI IN PAIRS Performed at [facility]    Culture   Final    NORMAL OROPHARYNGEAL FLORA Performed at [facility]    Report Status 04/23/2015 FINAL  Final  Culture, body fluid-bottle     Status: None (Preliminary result)   Collection Time: 04/25/15 11:49 AM  Result Value Ref Range Status   Specimen Description FLUID LEFT PLEURAL  Final   Special Requests BOTTLES DRAWN AEROBIC AND ANAEROBIC 10CC  Final   Culture   Final    NO GROWTH < 24 HOURS Performed at [facility]    Report Status PENDING  Incomplete  Gram stain     Status: None   Collection Time: 04/25/15 11:49 AM  Result Value Ref Range Status   Specimen Description FLUID LEFT PLEURAL  Final   Special Requests NONE  Final   Gram Stain   Final    WBC PRESENT,BOTH PMN AND MONONUCLEAR NO ORGANISMS SEEN CONFIRMED BY K.WOOTEN Performed at [facility]    Report Status 04/25/2015 FINAL  Final     Studies: Dg Chest 1 View  04/25/2015   CLINICAL DATA:  Status post left-sided thoracentesis.  EXAM: CHEST 1 VIEW  COMPARISON:  April 23, 2015.  FINDINGS: Stable cardiomegaly. Right-sided PICC line is unchanged in position. No pneumothorax is noted. Left pleural effusion is significantly smaller status post thoracentesis. Stable bibasilar edema or atelectasis is noted. Bony thorax is unremarkable.  IMPRESSION: Left pleural effusion is significantly smaller status post thoracentesis. No pneumothorax is noted.   Electronically Signed   By: Marijo Conception, M.D.   On: 04/25/2015 12:36    Scheduled Meds: . antiseptic oral rinse  7 mL Mouth Rinse q12n4p  . arformoterol  15 mcg Nebulization BID  . aspirin EC  81 mg Oral Daily  . budesonide (PULMICORT) nebulizer solution  0.25 mg Nebulization BID  . cefTRIAXone (ROCEPHIN)  IV  1 g Intravenous Q24H  . docusate sodium  100 mg Oral BID  . enoxaparin (LOVENOX) injection  30 mg Subcutaneous Q24H  . guaiFENesin  1,200 mg Oral BID  . ipratropium-albuterol  3 mL Nebulization QID  . levothyroxine  137  mcg Oral QAC breakfast  . mirtazapine  7.5 mg Oral QHS  . polyethylene glycol  17 g Oral Daily  . predniSONE  60 mg Oral Q breakfast  . senna  1 tablet Oral BID  . sodium chloride  10-40 mL Intracatheter Q12H   Continuous Infusions:   Principal Problem:   Sepsis ([place]) Active Problems:   Hypothyroidism   Acute respiratory failure with hypoxia and hypercarbia (HCC)   COPD exacerbation (HCC)   CAP (community acquired pneumonia)   Tobacco abuse   Protein-calorie malnutrition, severe   DNR (do not resuscitate)   Pleural effusion   Acute on chronic diastolic heart failure ([place])    Time spent: 30 min    Dovber Ernest, [facility] Pager [phone]. If 7PM-7AM, please contact night-coverage at [email or web address], password [facility] 04/26/2015, 6:43 PM  LOS: 7 days

## 2015-04-26 NOTE — Progress Notes (Signed)
Flow cytometry cannot be performed because it was kept in fridge and not room temperature. Contact lab for any questions.

## 2015-04-26 NOTE — Progress Notes (Signed)
Occupational Therapy Treatment Patient Details Name: Heidi [name] MRN: [id number] DOB: 1941/04/22 Today's Date: 04/26/2015    History of present illness Heidi [name] is a 74 y.o. female with a past medical history of chronic obstructive pulmonary disease, ongoing tobacco abuse, history of 4-5 mm pulmonary nodules seen on CT scan on 02/22/2015, brought to the emergency department by EMS on 04/19/15..  Family members reporting that she has had progressive shortness of breath over the 7-10 days prior to admission with cough productive of brownish sputum production. She has become progressively weaker and having poor tolerance to physical exertion.  Patient in acute hypercabic and hypoxic respiratory faiklure.   OT comments  Pt waiting to go to bathroom and needed to go Quickly  Follow Up Recommendations  Home health OT;Supervision/Assistance - 24 hour    Equipment Recommendations  3 in 1 bedside comode    Recommendations for Other Services      Precautions / Restrictions Precautions Precautions: Fall Precaution Comments: on high O2, monitor sats (as high as 96% and as low as 90% on 5 liters of O2)       Mobility Bed Mobility Overal bed mobility: Independent                Transfers Overall transfer level: Needs assistance Equipment used: Rolling walker (2 wheeled);None Transfers: Sit to/from Stand Sit to Stand: Supervision         General transfer comment: VCs for safe hand placement    Balance     Sitting balance-Leahy Scale: Good                             ADL Overall ADL's : Needs assistance/impaired     Grooming: Wash/dry hands;Set up;Supervision/safety;Standing                   Toilet Transfer: Ambulation;RW;Regular Toilet;Grab bars;Min guard   Toileting- Clothing Manipulation and Hygiene: Supervision/safety;Sit to/from stand                                        Pertinent Vitals/ Pain       Pain Assessment:  No/denies pain         Frequency Min 2X/week     Progress Toward Goals  OT Goals(current goals can now be found in the care plan section)        Plan Discharge plan remains appropriate       End of Session Equipment Utilized During Treatment: Rolling walker;Oxygen (5 liters)   Activity Tolerance Patient tolerated treatment well (reports that she feels much better after thorocentesis earlier today)   Patient Left in bed;with call bell/phone within reach;with chair alarm set   Nurse Communication  (and NT: left O2 extension for pt to use so she can get up to bathroom with A of staff and keep O2 on)        Time: 1430-1443 OT Time Calculation (min): 13 min  Charges: OT General Charges $OT Visit: 1 Procedure OT Treatments $Self Care/Home Management : 8-22 mins  Tacori Kvamme, Edwena Felty D 04/26/2015, 2:49 PM

## 2015-04-26 NOTE — Progress Notes (Signed)
Physical Therapy Treatment Patient Details Name: KASHAY CAVENAUGH MRN: [id number] DOB: 06/15/1941 Today's Date: 04/26/2015    History of Present Illness DERICKA OSTENSON is a 74 y.o. female with a past medical history of chronic obstructive pulmonary disease, ongoing tobacco abuse, history of 4-5 mm pulmonary nodules seen on CT scan on 02/22/2015, brought to the emergency department by EMS on 04/19/15..  Family members reporting that she has had progressive shortness of breath over the 7-10 days prior to admission with cough productive of brownish sputum production. She has become progressively weaker and having poor tolerance to physical exertion.  Patient in acute hypercabic and hypoxic respiratory faiklure.    PT Comments    Patient is mobilizing well but  With high Oxygen requirement. Sats did stay above 90% on 6 l. HR 110 patient reports that she will return home. Recommend HHPT  Follow Up Recommendations  Home health PT;Supervision/Assistance - 24 hour     Equipment Recommendations  None recommended by PT    Recommendations for Other Services       Precautions / Restrictions Precautions Precautions: Fall Precaution Comments: on high O2, monitor sats (as high as 96% and as low as 90% on 5 liters of O2)    Mobility  Bed Mobility Overal bed mobility: Independent                Transfers Overall transfer level: Needs assistance Equipment used: Rolling walker (2 wheeled);None Transfers: Sit to/from Stand Sit to Stand: Supervision         General transfer comment: VCs for safe hand placement  Ambulation/Gait Ambulation/Gait assistance: Min guard Ambulation Distance (Feet): 100 Feet Assistive device: Rolling walker (2 wheeled) Gait Pattern/deviations: Step-through pattern Gait velocity: decr, stop for sats to go back up   General Gait Details: then 40'thout RW, held to wall, door, sats 88% on 6 l to ambulat(no 5 l). HR 110.   Stairs            Wheelchair  Mobility    Modified Rankin (Stroke Patients Only)       Balance     Sitting balance-Leahy Scale: Good                              Cognition Arousal/Alertness: Awake/alert                          Exercises      General Comments        Pertinent Vitals/Pain Pain Assessment: No/denies pain    Home Living                      Prior Function            PT Goals (current goals can now be found in the care plan section) Progress towards PT goals: Progressing toward goals    Frequency  Min 3X/week    PT Plan Current plan remains appropriate    Co-evaluation             End of Session Equipment Utilized During Treatment: Oxygen;Gait belt Activity Tolerance: Patient tolerated treatment well Patient left: in bed;with call bell/phone within reach;with bed alarm set     Time: 7681-1572 PT Time Calculation (min) (ACUTE ONLY): 20 min  Charges:  $Gait Training: 8-22 mins  G Codes:      Claretha Cooper 04/26/2015, 12:43 PM

## 2015-04-26 NOTE — Care Management Important Message (Signed)
Important Message  Patient Details  Name: Heidi [name] MRN: [id number] Date of Birth: 02-11-1941   Medicare Important Message Given:  Yes-second notification given    Camillo Flaming 04/26/2015, 1:59 PMImportant Message  Patient Details  Name: Heidi [name] MRN: [id number] Date of Birth: 01-Aug-1940   Medicare Important Message Given:  Yes-second notification given    Camillo Flaming 04/26/2015, 1:59 PM

## 2015-04-26 NOTE — Progress Notes (Signed)
Date:  Oct. 10, 2016 U.R. performed for needs and level of care. Will continue to follow for Case Management needs.  Rhonda Davis, RN, BSN, CCM   [phone]  

## 2015-04-27 ENCOUNTER — Telehealth: Payer: Self-pay | Admitting: Adult Health

## 2015-04-27 DIAGNOSIS — R7989 Other specified abnormal findings of blood chemistry: Secondary | ICD-10-CM

## 2015-04-27 DIAGNOSIS — I471 Supraventricular tachycardia: Secondary | ICD-10-CM

## 2015-04-27 DIAGNOSIS — R778 Other specified abnormalities of plasma proteins: Secondary | ICD-10-CM

## 2015-04-27 LAB — BASIC METABOLIC PANEL
Anion gap: 4 — ABNORMAL LOW (ref 5–15)
BUN: 20 mg/dL (ref 6–20)
CALCIUM: 8.6 mg/dL — AB (ref 8.9–10.3)
CO2: 44 mmol/L — AB (ref 22–32)
CREATININE: 0.44 mg/dL (ref 0.44–1.00)
Chloride: 92 mmol/L — ABNORMAL LOW (ref 101–111)
GFR calc Af Amer: >60 mL/min (ref >60)
GFR calc non Af Amer: >60 mL/min (ref >60)
GLUCOSE: 88 mg/dL (ref 65–99)
Potassium: 4 mmol/L (ref 3.5–5.1)
Sodium: 140 mmol/L (ref 135–145)

## 2015-04-27 MED ORDER — FUROSEMIDE 20 MG PO TABS: 20.0000 mg | ORAL_TABLET | Freq: Every day | ORAL | Status: DC | PRN

## 2015-04-27 MED ORDER — TIOTROPIUM BROMIDE MONOHYDRATE 18 MCG IN CAPS: 18.0000 ug | ORAL_CAPSULE | Freq: Every day | RESPIRATORY_TRACT | Status: DC

## 2015-04-27 MED ORDER — FLUCONAZOLE 200 MG PO TABS: 200.0000 mg | ORAL_TABLET | Freq: Every day | ORAL | Status: DC

## 2015-04-27 MED ORDER — ALBUTEROL SULFATE HFA 108 (90 BASE) MCG/ACT IN AERS: 2.0000 | INHALATION_SPRAY | Freq: Four times a day (QID) | RESPIRATORY_TRACT | Status: DC | PRN

## 2015-04-27 MED ORDER — PREDNISONE 20 MG PO TABS: ORAL_TABLET | ORAL | Status: DC

## 2015-04-27 MED ORDER — ALPRAZOLAM 0.5 MG PO TABS: 0.5000 mg | ORAL_TABLET | Freq: Three times a day (TID) | ORAL | Status: DC | PRN

## 2015-04-27 MED ORDER — MORPHINE SULFATE (CONCENTRATE) 20 MG/ML PO SOLN: 10.0000 mg | ORAL | Status: DC | PRN

## 2015-04-27 MED ORDER — ALBUTEROL SULFATE (2.5 MG/3ML) 0.083% IN NEBU: 2.5000 mg | INHALATION_SOLUTION | Freq: Four times a day (QID) | RESPIRATORY_TRACT | Status: DC | PRN

## 2015-04-27 NOTE — Discharge Summary (Addendum)
Physician Discharge Summary  Heidi [name] EXN:[id number] DOB: 02/11/1941 DOA: 04/19/2015  PCP: Dorothyann Peng, NP  Admit date: 04/19/2015 Discharge date: 04/27/2015  Recommendations for Outpatient Follow-up:  1.  To home with hospice  Discharge Diagnoses:  Principal Problem:   Sepsis ([place]) Active Problems:   Hypothyroidism   Acute respiratory failure with hypoxia and hypercarbia (HCC)   COPD exacerbation (HCC)   CAP (community acquired pneumonia)   Tobacco abuse   Protein-calorie malnutrition, severe   DNR (do not resuscitate)   Pleural effusion   Acute on chronic diastolic heart failure (HCC)   Multifocal atrial tachycardia (HCC)   Elevated troponin   Discharge Condition: Fair  Diet recommendation: Regular  Wt Readings from Last 3 Encounters:  04/25/15 36.9 kg (81 lb 5.6 oz)  02/17/15 34.882 kg (76 lb 14.4 oz)  01/05/15 36.968 kg (81 lb 8 oz)    History of present illness:   The patient is a 74 year old female with history of COPD not previously on home oxygen, ongoing tobacco abuse, multiple pulmonary nodules who is brought to the emergency department by EMS. Her family had found her with worsening shortness of breath, productive cough, severe weakness. In the emergency department, she required BiPAP and was initially admitted to the stepdown unit.  Hospital Course:   Acute hypercarbic and hypoxemic respiratory failure secondary to acute COPD exacerbation and community-acquired pneumonia. She was initially in the stepdown unit on BiPAP. She was started on Solu-Medrol, nebulizer treatments, ceftriaxone and azithromycin. She had gradual improvement in her wheezing and respiratory failure, however she was not able to transition back to room air. I suspect that she had hypoxia at home and had declined oxygen previously when she probably needed it. She was able to tolerate 4-5 L of nasal cannula prior to discharge. Her IV steroids were transitioned to oral steroids to continue  with a long taper. She was prescribed inhaled corticosteroid and long-acting beta agonist, Spiriva, and albuterol as needed. For her community-acquired pneumonia, her blood cultures remain no growth to date. Her sputum culture grew normal flora. Her strep pneumococcal and Legionella antigens were negative. She completed 7 days of ceftriaxone and 1.5 g of azithromycin.  Acute on chronic diastolic heart failure.  Preserved LVEF, however, she has some moderately reduced right ventricular systolic function with a mildly dilated RV.  She had a rhonchorous cough and her chest x-ray demonstrated worsening effusions. She was started on intermittent dosed Lasix and underwent a left thoracentesis on 10/9 which demonstrated a transudate of effusion. Her cultures are still pending.  She was advised to weigh herself daily and if she gains more than 3 pounds in 1 day or 5 pounds over 7 days, she should take a dose of Lasix and notify the hospice nurse.    MAT and bigeminy. This was seen on telemetry however we were unable to capture of these rhythms on a 12-lead EKG. This occurred during the initial 24 hours of hospitalization and gradually she transition to normal sinus rhythm as her respiratory status improved. Echocardiogram demonstrated normal ejection fraction with grade 1 diastolic dysfunction, but no regional wall motion abnormalities.  She had stable, mildly elevated troponins of 0.33, 0.33, and 0.27 which were likely secondary to strain from severe respiratory distress.  Her ECHO demonstrated no regional wall motion abnormalities.  Due to entering hospice care and her end-stage COPD, she is not a candidate for catheterization and remained chest pain free.  Symptom management only.    Hyponatremia due to pneumonia  and dehydration, resolved with IVF and antibiotics.    Hypothyroidism, prior lab work demonstrated a TSH of 32.19 on 02/17/2015.  TSH 2.8 currently and she continued her synthroid.  Severe protein  calorie malnutrition, she was evaluated by nutrition who recommended a regular diet and supplements.    Constipation, resolved with colace, senna, daily miralax  Oral thrush, start fluconazole for 14 days.  Consultants:  Critical care  Palliative care  Procedures:  bipap  CXR  Antibiotics:  Ceftriaxone 10/3 > 10/9  Azithromycin 10/3 > 10/5  Discharge Exam: Filed Vitals:   04/27/15 1314  BP: 123/61  Pulse: 104  Temp: 98.2 F (36.8 C)  Resp: 20   Filed Vitals:   04/26/15 2114 04/27/15 0605 04/27/15 0831 04/27/15 1314  BP: 113/50 121/72  123/61  Pulse: 109 93  104  Temp: 98.2 F (36.8 C) 98.3 F (36.8 C)  98.2 F (36.8 C)  TempSrc: Oral Oral  Oral  Resp: 20 22  20   Height:      Weight:      SpO2: 94% 94% 92% 92%     General: Cachectic female, NAD  HEENT: NCAT, MMM  Cardiovascular: RRR  Respiratory: Rales at left base, diminished but otherwise CTAB  Abdomen: NABS, soft, NT/ND  MSK: Normal tone and bulk, no LEE  Neuro: Grossly intact  Discharge Instructions      Discharge Instructions    (HEART FAILURE PATIENTS) Call MD:  Anytime you have any of the following symptoms: 1) 3 pound weight gain in 24 hours or 5 pounds in 1 week 2) shortness of breath, with or without a dry hacking cough 3) swelling in the hands, feet or stomach 4) if you have to sleep on extra pillows at night in order to breathe.    Complete by:  As directed      Call MD for:  difficulty breathing, headache or visual disturbances    Complete by:  As directed      Call MD for:  extreme fatigue    Complete by:  As directed      Call MD for:  hives    Complete by:  As directed      Call MD for:  persistant dizziness or light-headedness    Complete by:  As directed      Call MD for:  persistant nausea and vomiting    Complete by:  As directed      Call MD for:  severe uncontrolled pain    Complete by:  As directed      Call MD for:  temperature >100.4    Complete by:  As  directed      Diet general    Complete by:  As directed      Discharge instructions    Complete by:  As directed   You were hospitalized with shortness of breath from COPD and pneumonia.  You completed your antibiotics in the hospital but you continue to have problems with your breathing from COPD.  Please take prednisone in a gradually decreasing dose for the next few days until all the tabs are gone.  Continue to use your advair and I encourage you to start taking your spiriva again if possible.  You may use albuterol as needed for shortness of breath, either the inhaler or the nebulizer.  I have given you prescriptions for xanax to use for anxiety and for morphine to use for shortness of breath or pain.  Morphine can cause constipation and  therefore, please use stool softeners and laxatives if needed.     Increase activity slowly    Complete by:  As directed             Medication List    STOP taking these medications        alendronate 70 MG tablet  Commonly known as:  FOSAMAX     loperamide 2 MG tablet  Commonly known as:  IMODIUM A-D      TAKE these medications        albuterol (2.5 MG/3ML) 0.083% nebulizer solution  Commonly known as:  PROVENTIL  Take 3 mLs (2.5 mg total) by nebulization every 6 (six) hours as needed for wheezing or shortness of breath.     albuterol 108 (90 BASE) MCG/ACT inhaler  Commonly known as:  PROVENTIL HFA;VENTOLIN HFA  Inhale 2 puffs into the lungs every 6 (six) hours as needed for wheezing or shortness of breath.     ALPRAZolam 0.5 MG tablet  Commonly known as:  XANAX  Take 1 tablet (0.5 mg total) by mouth 3 (three) times daily as needed for anxiety or sleep (air hunger. dyspnea).     fluconazole 200 MG tablet  Commonly known as:  DIFLUCAN  Take 1 tablet (200 mg total) by mouth daily.     Fluticasone-Salmeterol 100-50 MCG/DOSE Aepb  Commonly known as:  ADVAIR  Inhale 1 puff into the lungs 2 (two) times daily.     furosemide 20 MG tablet   Commonly known as:  LASIX  Take 1 tablet (20 mg total) by mouth daily as needed for fluid or edema.     levothyroxine 137 MCG tablet  Commonly known as:  SYNTHROID, LEVOTHROID  Take 1 tablet (137 mcg total) by mouth daily before breakfast.     mirtazapine 15 MG tablet  Commonly known as:  REMERON  Take 1 tablet (15 mg total) by mouth at bedtime.     morphine 20 MG/ML concentrated solution  Commonly known as:  ROXANOL  Take 0.5 mLs (10 mg total) by mouth every 2 (two) hours as needed for moderate pain, severe pain, anxiety or shortness of breath.     predniSONE 20 MG tablet  Commonly known as:  DELTASONE  Take 2 tabs daily x 3 days, 1 tab daily x 3 days, then half a tab daily x 4 days, then stop     tiotropium 18 MCG inhalation capsule  Commonly known as:  SPIRIVA HANDIHALER  Place 1 capsule (18 mcg total) into inhaler and inhale daily.     ULTRAVATE 0.05 % ointment  Generic drug:  halobetasol  Apply topically 2 (two) times daily.       Follow-up Information    Schedule an appointment as soon as possible for a visit with Dorothyann Peng, NP.   Specialty:  Family Medicine   Why:  As needed   Contact information:   [address] [place] [id number] [phone]        The results of significant diagnostics from this hospitalization (including imaging, microbiology, ancillary and laboratory) are listed below for reference.    Significant Diagnostic Studies: Dg Chest 1 View  04/25/2015   CLINICAL DATA:  Status post left-sided thoracentesis.  EXAM: CHEST 1 VIEW  COMPARISON:  April 23, 2015.  FINDINGS: Stable cardiomegaly. Right-sided PICC line is unchanged in position. No pneumothorax is noted. Left pleural effusion is significantly smaller status post thoracentesis. Stable bibasilar edema or atelectasis is noted. Bony thorax is unremarkable.  IMPRESSION: Left pleural effusion is significantly smaller status post thoracentesis. No pneumothorax is noted.    Electronically Signed   By: Marijo Conception, M.D.   On: 04/25/2015 12:36   Dg Chest Port 1 View  04/23/2015   CLINICAL DATA:  Cough and hypoxia.  EXAM: PORTABLE CHEST 1 VIEW  COMPARISON:  April 21, 2015 and January 05, 2015  FINDINGS: There is underlying COPD. There is generalized interstitial edema with cardiomegaly and pulmonary venous hypertension. There are bilateral effusions, much larger on the left than on the right. There is consolidation in the left lower lobe. Central catheter tip is just beyond the cavoatrial junction in the right atrium. No pneumothorax.  IMPRESSION: Congestive heart failure superimposed on COPD. Moderate left effusion, larger compared to 2 days prior. Consolidation left lower lobe, likely superimposed pneumonia.   Electronically Signed   By: Lowella Grip III M.D.   On: 04/23/2015 08:45   Dg Chest Port 1 View  04/21/2015   CLINICAL DATA:  Acute onset of shortness of breath. Respiratory failure. Initial encounter.  EXAM: PORTABLE CHEST 1 VIEW  COMPARISON:  Chest radiograph performed 04/20/2015  FINDINGS: The lungs are hyperexpanded, with flattening of the hemidiaphragms, compatible with COPD. Small bilateral pleural effusions are seen, left greater than right. Vascular congestion is noted, with increased interstitial markings, raising concern for mild interstitial edema, superimposed on chronic lung disease. No pneumothorax is seen.  The cardiomediastinal silhouette is borderline enlarged. A right PICC is seen ending about the distal SVC. No acute osseous abnormalities are seen.  IMPRESSION: 1. Small bilateral pleural effusions, left greater than right, with underlying vascular congestion and borderline cardiomegaly. Increased interstitial markings raise concern for mild interstitial edema. 2. Findings of COPD.   Electronically Signed   By: Garald Balding M.D.   On: 04/21/2015 06:07   Portable Chest 1 View  04/20/2015   CLINICAL DATA:  Sepsis.  EXAM: PORTABLE CHEST 1 VIEW   COMPARISON:  04/19/2015, 12/22/2009.  CT 02/22/2015.  FINDINGS: Mediastinum hilar structures normal. Persistent diffuse bilateral interstitial infiltrates again noted. Left base subsegmental atelectasis. Cardiomegaly with normal pulmonary vascularity. No pleural effusion or pneumothorax.  IMPRESSION: Persistent diffuse interstitial infiltrates with left base subsegmental atelectasis.   Electronically Signed   By: Marcello Moores  Register   On: 04/20/2015 07:10   Dg Chest Port 1 View  04/19/2015   CLINICAL DATA:  Sepsis  EXAM: PORTABLE CHEST 1 VIEW  COMPARISON:  1130 hours  FINDINGS: Heterogeneous opacities throughout both lungs are again noted. There has been some clearing at the lung bases. Heart remains enlarged. No pneumothorax.  IMPRESSION: Improved basilar opacities. Opacities throughout the remainder of the lungs persist.   Electronically Signed   By: Marybelle Killings M.D.   On: 04/19/2015 20:20   Dg Chest Portable 1 View  04/19/2015   CLINICAL DATA:  Increased shortness of breath for 1 day. History of COPD.  EXAM: PORTABLE CHEST 1 VIEW  COMPARISON:  01/05/2015  FINDINGS: Lungs are hyperinflated. There is significant interstitial prominence which has increased since prior study. Heart size is enlarged. There is opacity of both lung bases, left greater than right. This is increased compared with previous studies in may reflect acute infectious process. Suspect left pleural effusion.  IMPRESSION: 1. Bronchitic changes and COPD. 2. Cardiomegaly. 3. Bibasilar opacities left greater than right consistent with infectious process.   Electronically Signed   By: Nolon Nations M.D.   On: 04/19/2015 11:58   Dg Swallowing Func-speech Pathology  04/21/2015  Objective Swallowing Evaluation:    Patient Details  Name: Heidi [name] MRN: [id number] Date of Birth: 09/26/1940  Today's Date: 04/21/2015 Time: SLP Start Time (ACUTE ONLY): 1515-SLP Stop Time (ACUTE ONLY): 1530 SLP Time Calculation (min) (ACUTE ONLY): 15 min  Past  Medical History:  Past Medical History  Diagnosis Date  . COPD (chronic obstructive pulmonary disease) ([place])   . Hypothyroidism   . 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 Hyperlipidemia   . Pulmonary nodule   . 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([place])   . Depression   . Cerebrovascular disease   . Positive ANA (antinuclear antibody)   . Diverticulosis   . Esophageal stricture   . Arthritis   . History of pneumonia   . History of stroke     mild oncoming  . Pneumonia     HX of  . Shortness of breath     periodically with COPD flair up  . Internal hemorrhoids   . Rectal ulcer   . Osteoporosis    Past Surgical History:  Past Surgical History  Procedure Laterality Date  . Hiatal hernia repair      x 2  . Cataract extraction      bilateral  . Total abdominal hysterectomy w/ bilateral salpingoophorectomy  1998  . Bladder surgery      tack  . Hemicolectomy  2012    emergent surgery [facility]  . Tonsillectomy    . Colonoscopy  06/22/2011    Procedure: COLONOSCOPY;  Surgeon: Lafayette Dragon, MD;  Location: [facility]  ENDOSCOPY;  Service: Endoscopy;  Laterality: N/A;  . Gastric varices banding  06/22/2011    Procedure: HEMORRHOID BANDING;  Surgeon: Lafayette Dragon, MD;  Location:  [facility] ENDOSCOPY;  Service: Endoscopy;  Laterality: N/A;   HPI:  Other Pertinent Information: 74 year old female admitted 04/19/15 due to  SOB and AMS. PMH significant for COPD, esophageal stricture, PNA. Orders  received for BSE, which revealed no overt s/s aspiration. However, given  history of COPD, objective study was warranted.  No Data Recorded  Assessment / Plan / Recommendation CHL IP CLINICAL IMPRESSIONS 04/21/2015  Therapy Diagnosis WFL  Clinical Impression Normal oropharyngeal swallow. No penetration,  aspiration, postswallow residue on any consistency. Recommend regular diet  and thin liquids. No follow up at this time.      CHL IP TREATMENT RECOMMENDATION 04/21/2015  Treatment Recommendations No treatment recommended at this time     CHL  IP DIET RECOMMENDATION 04/21/2015  SLP Diet Recommendations Thin  Liquid Administration via (None)  Medication Administration Whole meds with liquid  Compensations Slow rate;Small sips/bites  Postural Changes and/or Swallow Maneuvers (None)     CHL IP OTHER RECOMMENDATIONS 04/21/2015  Recommended Consults (None)  Oral Care Recommendations Oral care BID  Other Recommendations (None)     No flowsheet data found.   No flowsheet data found.   Pertinent Vitals/Pain VSS, no pain reported    SLP Swallow Goals No flowsheet data found.  No flowsheet data found.    CHL IP REASON FOR REFERRAL 04/21/2015  Reason for Referral Objectively evaluate swallowing function     CHL IP ORAL PHASE 04/21/2015  Lips (None)  Tongue (None)  Mucous membranes (None)  Nutritional status (None)  Other (None)  Oxygen therapy (None)  Oral Phase WFL  Oral - Pudding Teaspoon (None)  Oral - Pudding Cup (None)  Oral - Honey Teaspoon (None)  Oral - Honey Cup (None)  Oral -  Honey Syringe (None)  Oral - Nectar Teaspoon (None)  Oral - Nectar Cup (None)  Oral - Nectar Straw (None)  Oral - Nectar Syringe (None)  Oral - Ice Chips (None)  Oral - Thin Teaspoon (None)  Oral - Thin Cup (None)  Oral - Thin Straw (None)  Oral - Thin Syringe (None)  Oral - Puree (None)  Oral - Mechanical Soft (None)  Oral - Regular (None)  Oral - Multi-consistency (None)  Oral - Pill (None)  Oral Phase - Comment (None)      CHL IP PHARYNGEAL PHASE 04/21/2015  Pharyngeal Phase WFL  Pharyngeal - Pudding Teaspoon (None)  Penetration/Aspiration details (pudding teaspoon) (None)  Pharyngeal - Pudding Cup (None)  Penetration/Aspiration details (pudding cup) (None)  Pharyngeal - Honey Teaspoon (None)  Penetration/Aspiration details (honey teaspoon) (None)  Pharyngeal - Honey Cup (None)  Penetration/Aspiration details (honey cup) (None)  Pharyngeal - Honey Syringe (None)  Penetration/Aspiration details (honey syringe) (None)  Pharyngeal - Nectar Teaspoon (None)  Penetration/Aspiration details  (nectar teaspoon) (None)  Pharyngeal - Nectar Cup (None)  Penetration/Aspiration details (nectar cup) (None)  Pharyngeal - Nectar Straw (None)  Penetration/Aspiration details (nectar straw) (None)  Pharyngeal - Nectar Syringe (None)  Penetration/Aspiration details (nectar syringe) (None)  Pharyngeal - Ice Chips (None)  Penetration/Aspiration details (ice chips) (None)  Pharyngeal - Thin Teaspoon (None)  Penetration/Aspiration details (thin teaspoon) (None)  Pharyngeal - Thin Cup (None)  Penetration/Aspiration details (thin cup) (None)  Pharyngeal - Thin Straw (None)  Penetration/Aspiration details (thin straw) (None)  Pharyngeal - Thin Syringe (None)  Penetration/Aspiration details (thin syringe') (None)  Pharyngeal - Puree (None)  Penetration/Aspiration details (puree) (None)  Pharyngeal - Mechanical Soft (None)  Penetration/Aspiration details (mechanical soft) (None)  Pharyngeal - Regular (None)  Penetration/Aspiration details (regular) (None)  Pharyngeal - Multi-consistency (None)  Penetration/Aspiration details (multi-consistency) (None)  Pharyngeal - Pill (None)  Penetration/Aspiration details (pill) (None)  Pharyngeal Comment (None)      CHL IP CERVICAL ESOPHAGEAL PHASE 04/21/2015  Cervical Esophageal Phase WFL  Pudding Teaspoon (None)  Pudding Cup (None)  Honey Teaspoon (None)  Honey Cup (None)  Honey Straw (None)  Nectar Teaspoon (None)  Nectar Cup (None)  Nectar Straw (None)  Nectar Sippy Cup (None)  Thin Teaspoon (None)  Thin Cup (None)  Thin Straw (None)  Thin Sippy Cup (None)  Cervical Esophageal Comment (None)    No flowsheet data found.         Shonna Chock 04/21/2015, 3:45 PM  [facility]elia [address]. Qu[facility] [place] [phone]   Microbiology: Recent Results (from the past 240 hour(s))  MRSA PCR Screening     Status: None   Collection Time: 04/19/15  6:07 PM  Result Value Ref Range Status   MRSA by PCR NEGATIVE NEGATIVE Final    Comment:        The GeneXpert MRSA Assay (FDA approved for  NASAL specimens only), is one component of a comprehensive MRSA colonization surveillance program. It is not intended to diagnose MRSA infection nor to guide or monitor treatment for MRSA infections.   Culture, blood (x 2)     Status: None   Collection Time: 04/19/15  6:35 PM  Result Value Ref Range Status   Specimen Description BLOOD LEFT FOREARM  Final   Special Requests IN PEDIATRIC BOTTLE 3 ML  Final   Culture   Final    NO GROWTH 5 DAYS Performed at [facility]    Report Status 04/24/2015 FINAL  Final  Culture, blood (x  2)     Status: None   Collection Time: 04/19/15  6:50 PM  Result Value Ref Range Status   Specimen Description BLOOD LEFT ARM  Final   Special Requests IN PEDIATRIC BOTTLE 2 ML  Final   Culture   Final    NO GROWTH 5 DAYS Performed at [facility]    Report Status 04/24/2015 FINAL  Final  Respiratory virus panel     Status: Abnormal   Collection Time: 04/19/15  8:32 PM  Result Value Ref Range Status   Respiratory Syncytial Virus A Negative Negative Final   Respiratory Syncytial Virus B Negative Negative Final   Influenza A Negative Negative Final   Influenza B Negative Negative Final   Parainfluenza 1 Negative Negative Final   Parainfluenza 2 Negative Negative Final   Parainfluenza 3 Negative Negative Final   Metapneumovirus Negative Negative Final   Rhinovirus Positive (A) Negative Final   Adenovirus Negative Negative Final    Comment: (NOTE) Performed At: [facility] [place], [place] [id number] Lindon Romp MD KK:[id number]   Culture, sputum-assessment     Status: None   Collection Time: 04/20/15  9:18 PM  Result Value Ref Range Status   Specimen Description SPUTUM  Final   Special Requests Normal  Final   Sputum evaluation   Final    THIS SPECIMEN IS ACCEPTABLE. RESPIRATORY CULTURE REPORT TO FOLLOW.   Report Status 04/20/2015 FINAL  Final  Culture, respiratory (NON-Expectorated)     Status: None    Collection Time: 04/20/15  9:18 PM  Result Value Ref Range Status   Specimen Description SPUTUM  Final   Special Requests NONE  Final   Gram Stain   Final    RARE WBC PRESENT, PREDOMINANTLY PMN RARE SQUAMOUS EPITHELIAL CELLS PRESENT RARE GRAM POSITIVE COCCI IN PAIRS Performed at [facility]    Culture   Final    NORMAL OROPHARYNGEAL FLORA Performed at [facility]    Report Status 04/23/2015 FINAL  Final  Culture, body fluid-bottle     Status: None (Preliminary result)   Collection Time: 04/25/15 11:49 AM  Result Value Ref Range Status   Specimen Description FLUID LEFT PLEURAL  Final   Special Requests BOTTLES DRAWN AEROBIC AND ANAEROBIC 10CC  Final   Culture   Final    NO GROWTH 2 DAYS Performed at [facility]    Report Status PENDING  Incomplete  Gram stain     Status: None   Collection Time: 04/25/15 11:49 AM  Result Value Ref Range Status   Specimen Description FLUID LEFT PLEURAL  Final   Special Requests NONE  Final   Gram Stain   Final    WBC PRESENT,BOTH PMN AND MONONUCLEAR NO ORGANISMS SEEN CONFIRMED BY K.WOOTEN Performed at [facility]    Report Status 04/25/2015 FINAL  Final     Labs: Basic Metabolic Panel:  Recent Labs Lab 04/21/15 0524 04/23/15 0500 04/24/15 0850 04/25/15 0615 04/26/15 0540 04/27/15 0428  NA 131* 134* 137 136 138 140  K 4.0 4.6 4.3 4.5 4.5 4.0  CL 86* 86* 87* 85* 88* 92*  CO2 36* 46* 43* 45* 45* 44*  GLUCOSE 113* 128* 262* 95 114* 88  BUN 15 15 20 16 15 20   CREATININE 0.39* 0.37* 0.55 0.42* 0.32* 0.44  CALCIUM 8.3* 8.5* 8.6* 8.4* 8.6* 8.6*  MG 1.8  --   --   --   --   --  Liver Function Tests: No results for input(s): AST, ALT, ALKPHOS, BILITOT, PROT, ALBUMIN in the last 168 hours. No results for input(s): LIPASE, AMYLASE in the last 168 hours. No results for input(s): AMMONIA in the last 168 hours. CBC:  Recent Labs Lab 04/21/15 0524 04/23/15 0500 04/24/15 0600 04/25/15 0615  04/26/15 0540  WBC 7.9 9.1 11.6* 10.3 7.9  HGB 12.6 12.2 13.6 13.6 12.8  HCT 39.1 41.4 44.4 43.3 41.5  MCV 89.5 95.4 93.3 91.5 92.2  PLT 190 209 301 278 268   Cardiac Enzymes: No results for input(s): CKTOTAL, CKMB, CKMBINDEX, TROPONINI in the last 168 hours. BNP: BNP (last 3 results)  Recent Labs  04/19/15 1359  BNP 1672.8*    ProBNP (last 3 results) No results for input(s): PROBNP in the last 8760 hours.  CBG: No results for input(s): GLUCAP in the last 168 hours.  Time coordinating discharge: 35 minutes  Signed:  Zoila Ditullio  [facility] 04/27/2015, 1:42 PM

## 2015-04-27 NOTE — Progress Notes (Signed)
[id number] davis,RN,BSN,CCM: Spoke with patient and left hospice provider list with patient with my name and number for family to call on arrival.

## 2015-04-27 NOTE — Telephone Encounter (Signed)
Tommi Rumps is pt's pcp, but hospice needs an attending physician They need to know who will be the attending physcian. They asked me if Dr Elease Hashimoto as the attending physician., Please advise if Dr Elease Hashimoto will be. Thank you

## 2015-04-27 NOTE — Progress Notes (Signed)
Nutrition Follow-up  DOCUMENTATION CODES:   Severe malnutrition in context of chronic illness, Underweight  INTERVENTION:  - Continue Magic Cup BID - RD will continue to monitor for needs  NUTRITION DIAGNOSIS:   Inadequate oral intake related to inability to eat as evidenced by NPO status. -resolved with diet advancement and intakes mainly 50-90%. No new nutrition dx.   GOAL:   Patient will meet greater than or equal to 90% of their needs -mainly met  MONITOR:   PO intake, Supplement acceptance, Weight trends, Labs, I & O's  ASSESSMENT:   74 y.o. female with a past medical history of chronic obstructive pulmonary disease, ongoing tobacco abuse, history of 4-5 mm pulmonary nodules seen on CT scan on 02/22/2015, brought to the emergency department by EMS. History was obtained from family members present at bedside and emergency room staff as she was unable to provide history given hypercarbia. Family members reporting that she has had progressive shortness of breath over the past 7-10 days associate with cough and brownish sputum production. During this time she has become progressively weaker and having poor tolerance to physical exertion. She currently resides at home with her daughter and son-in-law. In the last 24 hours she has had significant mental status changes becoming increasing confused and lethargic.   10/11 Pt to d/c with hospice. Per chart review, she ate 75% breakfast, 90% lunch, and 50% dinner 10/9, 90% lunch yesterday (10/10) and 75% of breakfast this AM. Pt meeting needs at this time without issue. Medications reviewed. Lab reviewed; Cl: 92 mmol/L, Ca: 8.6 mg/dL.   10/5 - Pt has been NPO and is pending swallow evaluation.  - She states she is "starving" this AM and is very interested in receiving food.  - She states that PTA she had a very good appetite and ate a variety of foods but did not like items such as cookies, cakes, or pies. - She denies any chewing or  swallowing issues with foods or liquids although she does state that family had to cut food into small, bite sized pieces in order for her to tolerate them well.  - She was not drinking nutrition supplements PTA. - Per chart review, pt has lost 4 lbs (4.8% body weight) in the past 5 months which is not significant for time frame.  - Noted severe muscle and fat wasting to upper body; lower body not assessed. -  Noted that dysphagia 1, nectar-thick diet order placed @ 0930 today but no notes from SLP yet.   Diet Order:  Diet regular Room service appropriate?: Yes; Fluid consistency:: Thin  Skin:  Reviewed, no issues  Last BM:  10/9  Height:   Ht Readings from Last 1 Encounters:  04/19/15 $RemoveB'5\' 3"'[id number]$  (1.6 m)    Weight:   Wt Readings from Last 1 Encounters:  04/25/15 81 lb 5.6 oz (36.9 kg)    Ideal Body Weight:  52.27 kg (kg)  BMI:  Body mass index is 14.41 kg/(m^2).  Estimated Nutritional Needs:   Kcal:  1000-1200  Protein:  35-45 grams  Fluid:  1.6-1.8 L/day  EDUCATION NEEDS:   No education needs identified at this time      Jarome Matin, RD, LDN Inpatient Clinical Dietitian Pager # [phone] After hours/weekend pager # [phone]

## 2015-04-28 NOTE — Telephone Encounter (Signed)
i will be happy to unless Hospice prefers to have one of their attendings be primary.

## 2015-04-28 NOTE — Telephone Encounter (Signed)
Heidi [name] at [facility] aware dr Elease Hashimoto will be attending physician

## 2015-04-28 NOTE — Telephone Encounter (Signed)
Dr. Elease Hashimoto will be the attending physician

## 2015-04-30 ENCOUNTER — Institutional Professional Consult (permissible substitution): Payer: Medicare Other | Admitting: Pulmonary Disease

## 2015-04-30 LAB — CULTURE, BODY FLUID W GRAM STAIN -BOTTLE: Culture: NO GROWTH

## 2015-05-04 ENCOUNTER — Institutional Professional Consult (permissible substitution): Payer: Medicare Other | Admitting: Pulmonary Disease

## 2015-05-13 ENCOUNTER — Ambulatory Visit (INDEPENDENT_AMBULATORY_CARE_PROVIDER_SITE_OTHER): Payer: Medicare Other | Admitting: Pulmonary Disease

## 2015-05-13 VITALS — BP 122/70 | HR 91 | Temp 97.4°F | Wt 78.2 lb

## 2015-05-13 DIAGNOSIS — J438 Other emphysema: Secondary | ICD-10-CM

## 2015-05-13 DIAGNOSIS — J9601 Acute respiratory failure with hypoxia: Secondary | ICD-10-CM

## 2015-05-13 DIAGNOSIS — J189 Pneumonia, unspecified organism: Secondary | ICD-10-CM

## 2015-05-13 DIAGNOSIS — I5033 Acute on chronic diastolic (congestive) heart failure: Secondary | ICD-10-CM

## 2015-05-13 DIAGNOSIS — J410 Simple chronic bronchitis: Secondary | ICD-10-CM

## 2015-05-13 DIAGNOSIS — J90 Pleural effusion, not elsewhere classified: Secondary | ICD-10-CM

## 2015-05-13 DIAGNOSIS — J9602 Acute respiratory failure with hypercapnia: Secondary | ICD-10-CM

## 2015-05-13 DIAGNOSIS — E43 Unspecified severe protein-calorie malnutrition: Secondary | ICD-10-CM

## 2015-05-13 NOTE — Patient Instructions (Signed)
Heidi [name]-- it was nice meeting you today...  I recommend that you stay on your oxygen at 4L/min continuously 24/7...  You should also stay on the [place] one inhalation twice daily, and the [facility] once daily...    Combine those w/ your NEBULIZER 4 times a day...   I will send a note to your primary doctor (DrCory) for him to review...  Please let me know if I can be of service in any way.Marland KitchenMarland Kitchen

## 2015-05-15 ENCOUNTER — Encounter: Payer: Self-pay | Admitting: Pulmonary Disease

## 2015-05-15 NOTE — Progress Notes (Signed)
Subjective:     Patient ID: Heidi [name], female   DOB: 06-30-1941, 74 y.o.   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 She was seen by [place] attended her in the hosp; Treated w/ Rocephin/ Zithromax, Solumedrol, Nebs, & brief stay in step-down unit on BiPap; disch on ADVAIR100-1spBid, SPIRIVA daily, PRED taper, NEBS w/ Albut... She was also treated for acute on chronic diastolic CHF- mod dil RV & reduced RV sys function (w/ preserved LVF) on 2DEcho & diuresed w/ Lasix but only disch on 20mg  prn for edema... Hospice was consulted (DrGolding's note reviewed) and she has entered the Hospice program (she is a DNR, DNI) w/ a prognosis est <41mo & she was disch on ROXANOL 20mg /ml--take 1/2 ml Q2H as needed for pain, SOB, anxiety...  She last saw DrSwords 11/2013, she has established new PCP- Dorothyann Peng & DrBurchette- has post hosp f/u pending; Note> she cancelled 2 appts w/ DrMcQuaid recently...       Currently Heidi [name] is very stoic- states she's not having any breathing problems at the present time & credits the thoracentesis w/ her remarkable improvement; her last cigarette was 04/19/15 prior to her admission; she was disch on O2 at 4L/min & has an O2sat=89% on this in the office today (RA sat was 74%); she was unable to perform PFT today; we had a frank discussion about her severe, end-stage COPD...       Epic records indicate that she was seen by DrWert in 2011> on Spiriva +/- Advair, stated she quit smoking 2011 on adm for COPD exac, PFT reported FEV1=1.32 (77%), FEV1/FVC ratio=50%,  DLCO=70%; CXR showed COPD & biapical pleuroparenchymal scarring, CT Chest 4/11 showed ?mult pulm nodules ?early cavitation r/o MAI, cultures- only NTF, O2sat=95% on RA...  Medical Hx>   She had an annual wellness exam from her PCP 02/2015> COPD, Hx pulm nodule, Hx of pneumonia; Hypothyroidism; adenomatous colonic polyps; Hyperlipidemia; Sigmoid volvulus; Depression; Cerebrovascular disease; Positive ANA; Diverticulosis; Esophageal stricture; Arthritis; History of stroke; Internal hemorrhoids; and Rectal ulcer; protein-calorie malnutrition (she weighs 78 lbs...   EXAM shows Afeb, VSS, O2sat=89% on O2 at 4L/min; Wt=78#, 5'2"Tall, BMI=16;  Heent- neg, mallampati1;  Chest- decr BS bilat, scat rhonchi, & end-exp wheezing;  Heart- RR gr1/6 SEM w/o r/g;  Abd soft, neg;  Ext- w/o c/c/ tr edema...   Baseline CXR 12/31/14 showed hyperinflation c/w COPD, biapical pleuroparenchymal scarring, nipple shadows and T12 compression fx...  CT Chest 02/22/15 showed cardiomeg, atherosclerotic changes in Ao & coronaries, small right effusion; emhysema w/ several additional findings- RUL & lingular subpleural opacities, & 27mm LUL & RLL nodules; osteopenia and T7 +T12 compression fractures...   CXRs performed during the 04/2015 Admission showed cardiomeg, CHF w/ left effusion, COPD w/ interstitial edema & LLL opac => improved post thoracentesis 10/9 w/ 400cc removed...  ABGs during the 10/16 Hosp showed pCO2 in the 73-87 range w/ resp acidosis...   LABS 10/16 reviewed> CBC- ok w/ Hg=13 range;  Chems- note HCO3=44-45 range & she may benefit drom Acetazolamide  added to her diuretic regimen if more aggressive treatment is desired...   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 She also has a hx of poor compliance w/ med rx & she has once again stopped the prescribed meds perceiving that she is much better since the  thoracentesis; she was referred to Hospice for outpt follow up & will continue to see her PCPs at [facility]; I told her I would be happy to recheck at any time if she so desired...  NOTE> since her disch she states that her breathing has been "so much better" that she hasn't needed her inhalers or the nebulizer, and she's off the Pred; she states she is taking the ASA81, Lasix20, Synthroid137, Xanax, and Remeron; she apparently hasn't needed the Roxanol yet...  We had a frank discussion about her end-stage disease & we "proved it to her" by checking her O2sat off the oxygen (74%), and attempting to get a Spirometry tracing (she couldn't do it);  Pt is asked to get back on her meds & we outlined a regimen>  NEBS w/ Albut Qid (breakfast, lunch, dinner, & bedtime);  ADVAIR100- 2spBid (taken after the Neb at breakfast & dinner);  SPIRIVA via handihaler once daily (taken after the Neb at lunch)...     Past Medical History  Diagnosis Date  . COPD (chronic obstructive pulmonary disease) ([place])   . Hypothyroidism   . 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 Hyperlipidemia   . Pulmonary nodule   . 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([place])   . Depression   . Cerebrovascular disease   . Positive ANA (antinuclear antibody)   . Diverticulosis   . Esophageal stricture   . Arthritis   . History of pneumonia   . History of stroke     mild oncoming  . Pneumonia     HX of  . Shortness of breath     periodically with COPD flair up  . Internal hemorrhoids   . Rectal ulcer   . Osteoporosis     Past Surgical History  Procedure Laterality Date  . Hiatal hernia repair      x 2  . Cataract extraction      bilateral  . Total abdominal hysterectomy w/ bilateral salpingoophorectomy  1998  . Bladder surgery      tack  . Hemicolectomy  2012    emergent surgery [facility]  . Tonsillectomy    . Colonoscopy  06/22/2011    Procedure: COLONOSCOPY;  Surgeon: Lafayette Dragon, MD;  Location: [facility]  ENDOSCOPY;  Service: Endoscopy;  Laterality: N/A;  . Gastric varices banding  06/22/2011    Procedure: HEMORRHOID BANDING;  Surgeon: Lafayette Dragon, MD;  Location: [facility] ENDOSCOPY;  Service: Endoscopy;  Laterality: N/A;    Allergies  Allergen Reactions  . Penicillins Hives    Has patient had a PCN reaction causing immediate rash, facial/tongue/throat swelling, SOB or lightheadedness with hypotension: No Has patient had a PCN reaction causing severe rash involving mucus membranes or skin necrosis: No Has patient had a PCN reaction that required hospitalization No Has patient had a PCN reaction occurring within the last 10 years: No If all of the above answers are "NO", then may proceed with Cephalosporin use.    Immunization History  Administered Date(s) Administered  . H1N1 06/30/2008  . Influenza Split 04/18/2012  .  Influenza Whole 06/10/2007, 04/14/2010, 04/18/2012  . Influenza,inj,Quad PF,36+ Mos 04/16/2013  . Pneumococcal Polysaccharide-23 05/17/2006, 04/16/2013  . Td 03/29/1999  . Tdap 04/16/2013    Family History  Problem Relation Age of Onset  . Colon cancer Mother   . COPD Father   . Heart attack Father   . Stroke Maternal Aunt   . Colon polyps Brother   . Liver disease Maternal Aunt   . Breast cancer Maternal Aunt   . Hypertension Son   . Hypothyroidism Daughter     Social History   Social History  . Marital Status: Widowed    Spouse Name: N/A  . Number of Children: 3  . Years of Education: N/A   Occupational History  . RETIRED    Social History Main Topics  . Smoking status: Former Smoker    Quit date: 11/01/2009  . Smokeless tobacco: Never Used  . Alcohol Use: No  . Drug Use: No  . Sexual Activity: Not Currently   Other Topics Concern  . Not on file   Social History Narrative   Patient has no recent travel. She is smoking 2 packs per day and has smoked since age of 40. Has cats and dogs at home. No bird or mold exposure.     Current Medications,  Allergies, Past Medical History, Past Surgical History, Family History, and Social History were reviewed in [employer] record.   Review of Systems             All symptoms NEG except where BOLDED >>  Constitutional:  F/C/S, fatigue, anorexia, unexpected weight change. HEENT:  HA, visual changes, hearing loss, earache, nasal symptoms, sore throat, mouth sores, hoarseness. Resp:  cough, sputum, hemoptysis; SOB, tightness, wheezing. Cardio:  CP, palpit, DOE, orthopnea, edema. GI:  N/V/D/C, blood in stool; reflux, abd pain, distention, gas. GU:  dysuria, freq, urgency, hematuria, flank pain, voiding difficulty. MS:  joint pain, swelling, tenderness, decr ROM; neck pain, back pain, etc. Neuro:  HA, tremors, seizures, dizziness, syncope, weakness, numbness, gait abn. Skin:  suspicious lesions or skin rash. Heme:  adenopathy, bruising, bleeding. Psyche:  confusion, agitation, sleep disturbance, hallucinations, anxiety, depression suicidal.   Objective:   Physical Exam       Vital Signs:  Reviewed...  General:  WD, thin, 74 y/o WM in NAD; alert & oriented; pleasant & cooperative... HEENT:  [place]/AT; Conjunctiva- pink, Sclera- nonicteric, EOM-wnl, PERRLA, EACs-clear, TMs-wnl; NOSE-clear; THROAT-clear & wnl. Neck:  Supple w/ fair ROM; no JVD; normal carotid impulses w/o bruits; no thyromegaly or nodules palpated; no lymphadenopathy. Chest:  Decr BS bilat, few scat rhonchi 7 end-exp wheezing, no rales, no rubs... Heart:  Regular Rhythm; ?S4, gr1/6 SEM Abdomen:  Soft & nontender- no guarding or rebound; normal bowel sounds; no organomegaly or masses palpated. Ext:  decr ROM; without deformities +arthritic changes; no varicose veins, venous insuffic, tr edema;  Pulses intact w/o bruits. Neuro:  CNs II-XII intact; motor testing normal; sensory testing normal; gait normal & balance OK. Derm:  No lesions noted; no rash etc. Lymph:  No cervical, supraclavicular, axillary, or  inguinal adenopathy palpated.   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 She also has a hx of poor compliance w/ med rx & she has once again stopped the prescribed meds perceiving that she is much better since the thoracentesis; she was  referred to Hospice for outpt follow up & will continue to see her PCPs at [facility]; I told her I would be happy to recheck at any time if she so desired...  NOTE> since her disch she states that her breathing has been "so much better" that she hasn't needed her inhalers or the nebulizer, and she's off the Pred; she states she is taking the ASA81, Lasix20, Synthroid137, Xanax, and Remeron; she apparently hasn't needed the Roxanol yet...  We had a frank discussion about her end-stage disease & we "proved it to her" by checking her O2sat off the oxygen (74%), and attempting to get a Spirometry tracing (she couldn't do it);  Pt is asked to get back on her meds & we outlined a regimen>  NEBS w/ Albut Qid (breakfast, lunch, dinner, & bedtime);  ADVAIR100- 2spBid (taken after the Neb at breakfast & dinner);  SPIRIVA via handihaler once daily (taken after the Neb at lunch)...      Plan:     Patient's Medications  New Prescriptions   No medications on file  Previous Medications   ALBUTEROL (PROVENTIL HFA;VENTOLIN HFA) 108 (90 BASE) MCG/ACT INHALER    Inhale 2 puffs into the lungs every 6 (six) hours as needed for wheezing or shortness of breath.   ALBUTEROL (PROVENTIL) (2.5 MG/3ML) 0.083% NEBULIZER SOLUTION    Take 3 mLs (2.5 mg total) by nebulization every 6 (six) hours as needed for wheezing or shortness of breath.   ALPRAZOLAM (XANAX) 0.5 MG TABLET    Take 1 tablet (0.5 mg total) by mouth 3 (three) times daily as needed for anxiety or sleep (air hunger. dyspnea).   FLUCONAZOLE (DIFLUCAN) 200 MG TABLET    Take 1 tablet (200 mg total)  by mouth daily.   FLUTICASONE-SALMETEROL (ADVAIR) 100-50 MCG/DOSE AEPB    Inhale 1 puff into the lungs 2 (two) times daily.   FUROSEMIDE (LASIX) 20 MG TABLET    Take 1 tablet (20 mg total) by mouth daily as needed for fluid or edema.   HALOBETASOL (ULTRAVATE) 0.05 % OINTMENT    Apply topically 2 (two) times daily.     LEVOTHYROXINE (SYNTHROID, LEVOTHROID) 137 MCG TABLET    Take 1 tablet (137 mcg total) by mouth daily before breakfast.   MIRTAZAPINE (REMERON) 15 MG TABLET    Take 1 tablet (15 mg total) by mouth at bedtime.   MORPHINE (ROXANOL) 20 MG/ML CONCENTRATED SOLUTION    Take 0.5 mLs (10 mg total) by mouth every 2 (two) hours as needed for moderate pain, severe pain, anxiety or shortness of breath.   PREDNISONE (DELTASONE) 20 MG TABLET    Take 2 tabs daily x 3 days, 1 tab daily x 3 days, then half a tab daily x 4 days, then stop   TIOTROPIUM (SPIRIVA HANDIHALER) 18 MCG INHALATION CAPSULE    Place 1 capsule (18 mcg total) into inhaler and inhale daily.  Modified Medications   No medications on file  Discontinued Medications   No medications on file

## 2015-05-20 NOTE — ED Provider Notes (Signed)
CSN: [id number]     Arrival date & time 04/19/15  1043 History   First MD Initiated Contact with Patient 04/19/15 1046     Chief Complaint  Patient presents with  . Shortness of Breath  . Weakness     (Consider location/radiation/quality/duration/timing/severity/associated sxs/prior Treatment) HPI Comments: 74 y.o. Female with history of COPD, hyperlipidemia, hypothyroidism presents for shortness of breath.  Patient has been increasingly short of breath over the last week or so.  She has had a cough over this time that has become productive of brownish sputum and progressive weakness.The family has found that over the last day she has become confused and tired.  Patient is a 74 y.o. female presenting with shortness of breath and weakness.  Shortness of Breath Weakness Associated symptoms include shortness of breath.    Past Medical History  Diagnosis Date  . COPD (chronic obstructive pulmonary disease) ([place])   . Hypothyroidism   . 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 Hyperlipidemia   . Pulmonary nodule   . 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([place])   . Depression   . Cerebrovascular disease   . Positive ANA (antinuclear antibody)   . Diverticulosis   . Esophageal stricture   . Arthritis   . History of pneumonia   . History of stroke     mild oncoming  . Pneumonia     HX of  . Shortness of breath     periodically with COPD flair up  . Internal hemorrhoids   . Rectal ulcer   . Osteoporosis    Past Surgical History  Procedure Laterality Date  . Hiatal hernia repair      x 2  . Cataract extraction      bilateral  . Total abdominal hysterectomy w/ bilateral salpingoophorectomy  1998  . Bladder surgery      tack  . Hemicolectomy  2012    emergent surgery [facility]  . Tonsillectomy    . Colonoscopy  06/22/2011    Procedure: COLONOSCOPY;  Surgeon: Lafayette Dragon, MD;  Location: [facility];  Service: Endoscopy;   Laterality: N/A;  . Gastric varices banding  06/22/2011    Procedure: HEMORRHOID BANDING;  Surgeon: Lafayette Dragon, MD;  Location: [facility];  Service: Endoscopy;  Laterality: N/A;   Family History  Problem Relation Age of Onset  . Colon cancer Mother   . COPD Father   . Heart attack Father   . Stroke Maternal Aunt   . Colon polyps Brother   . Liver disease Maternal Aunt   . Breast cancer Maternal Aunt   . Hypertension Son   . Hypothyroidism Daughter    Social History  Substance Use Topics  . Smoking status: Former Smoker    Quit date: 11/01/2009  . Smokeless tobacco: Never Used  . Alcohol Use: No   OB History    No data available     Review of Systems  Unable to perform ROS: Mental status change  Respiratory: Positive for shortness of breath.   Neurological: Positive for weakness.      Allergies  Penicillins  Home Medications   Prior to Admission medications   Medication Sig Start Date End Date Taking? Authorizing Provider  Fluticasone-Salmeterol (ADVAIR) 100-50 MCG/DOSE AEPB Inhale 1 puff into the lungs 2 (two) times daily. 12/15/14  Yes Dorothyann Peng, NP  levothyroxine (SYNTHROID, LEVOTHROID) 137 MCG tablet Take 1 tablet (137 mcg total) by mouth daily before breakfast. 02/18/15  Yes  Dorothyann Peng, NP  mirtazapine (REMERON) 15 MG tablet Take 1 tablet (15 mg total) by mouth at bedtime. 12/15/14  Yes Dorothyann Peng, NP  albuterol (PROVENTIL HFA;VENTOLIN HFA) 108 (90 BASE) MCG/ACT inhaler Inhale 2 puffs into the lungs every 6 (six) hours as needed for wheezing or shortness of breath. 04/27/15   Janece Canterbury, MD  albuterol (PROVENTIL) (2.5 MG/3ML) 0.083% nebulizer solution Take 3 mLs (2.5 mg total) by nebulization every 6 (six) hours as needed for wheezing or shortness of breath. 04/27/15   Janece Canterbury, MD  ALPRAZolam Duanne Moron) 0.5 MG tablet Take 1 tablet (0.5 mg total) by mouth 3 (three) times daily as needed for anxiety or sleep (air hunger. dyspnea). 04/27/15    Janece Canterbury, MD  fluconazole (DIFLUCAN) 200 MG tablet Take 1 tablet (200 mg total) by mouth daily. Patient not taking: Reported on 05/13/2015 04/27/15   Janece Canterbury, MD  furosemide (LASIX) 20 MG tablet Take 1 tablet (20 mg total) by mouth daily as needed for fluid or edema. 04/27/15   Janece Canterbury, MD  halobetasol (ULTRAVATE) 0.05 % ointment Apply topically 2 (two) times daily.      Historical Provider, MD  morphine (ROXANOL) 20 MG/ML concentrated solution Take 0.5 mLs (10 mg total) by mouth every 2 (two) hours as needed for moderate pain, severe pain, anxiety or shortness of breath. 04/27/15   Janece Canterbury, MD  predniSONE (DELTASONE) 20 MG tablet Take 2 tabs daily x 3 days, 1 tab daily x 3 days, then half a tab daily x 4 days, then stop Patient not taking: Reported on 05/13/2015 04/27/15   Janece Canterbury, MD  tiotropium (SPIRIVA HANDIHALER) 18 MCG inhalation capsule Place 1 capsule (18 mcg total) into inhaler and inhale daily. 04/27/15   Janece Canterbury, MD   BP 123/61 mmHg  Pulse 104  Temp(Src) 98.2 F (36.8 C) (Oral)  Resp 20  Ht 5\' 3"  (1.6 m)  Wt 81 lb 5.6 oz (36.9 kg)  BMI 14.41 kg/m2  SpO2 93% Physical Exam  Constitutional: She appears distressed.  HENT:  Head: Normocephalic and atraumatic.  Eyes: Right eye exhibits no discharge. Left eye exhibits no discharge.  Neck: Normal range of motion. Neck supple.  Cardiovascular: Intact distal pulses.  Tachycardia present.   Pulmonary/Chest: She is in respiratory distress. She has wheezes.  Abdominal: She exhibits no distension. There is no guarding.  Musculoskeletal: She exhibits no edema or tenderness.  Skin: Skin is warm and dry.  Vitals reviewed.   ED Course  Procedures (including critical care time) Labs Review Labs Reviewed  RESPIRATORY VIRUS PANEL - Abnormal; Notable for the following:    Rhinovirus Positive (*)    All other components within normal limits  CBC WITH DIFFERENTIAL/PLATELET - Abnormal; Notable  for the following:    WBC 15.2 (*)    RBC 5.12 (*)    Hemoglobin 15.1 (*)    RDW 15.7 (*)    Neutro Abs 14.2 (*)    Lymphs Abs 0.5 (*)    All other components within normal limits  COMPREHENSIVE METABOLIC PANEL - Abnormal; Notable for the following:    Sodium 125 (*)    Chloride 80 (*)    CO2 35 (*)    Glucose, Bld 143 (*)    Calcium 8.4 (*)    Total Protein 6.4 (*)    Albumin 3.0 (*)    ALT 10 (*)    All other components within normal limits  BRAIN NATRIURETIC PEPTIDE - Abnormal; Notable for the following:  B Natriuretic Peptide 1672.8 (*)    All other components within normal limits  BLOOD GAS, ARTERIAL - Abnormal; Notable for the following:    pH, Arterial 7.275 (*)    pCO2 arterial 86.6 (*)    pO2, Arterial 62.8 (*)    Bicarbonate 39.0 (*)    Acid-Base Excess 8.0 (*)    All other components within normal limits  BLOOD GAS, ARTERIAL - Abnormal; Notable for the following:    pH, Arterial 7.271 (*)    pCO2 arterial 84.7 (*)    pO2, Arterial 67.6 (*)    Bicarbonate 37.7 (*)    Acid-Base Excess 7.2 (*)    All other components within normal limits   Imaging Review No results found. I have personally reviewed and evaluated these images and lab results as part of my medical decision-making.   EKG Interpretation   Date/Time:  Monday April 19 2015 13:18:59 EDT Ventricular Rate:  106 PR Interval:  123 QRS Duration: 88 QT Interval:  360 QTC Calculation: 478 R Axis:   -80 Text Interpretation:  Sinus tachycardia Left atrial enlargement Inferior  infarct, old No significant change since last tracing Reconfirmed by  [place], Raquel Sarna ([id number]) on 05/21/2015 7:47:41 AM      MDM  Patient seen and evaluated with family at bedside.  Patient found to be hypercarbic with signs of pneumonia and COPD exacerbation.  Patient given breathing treatment, solumedrol, started on BiPAP.  Discussed with Dr. Coralyn Pear from hospitalist for admission who requested critical care consult and they  evaluated patient at bedside.  Patient was admitted for further treatment and evaluation. Final diagnoses:  Hypercarbia  Acute respiratory distress (HCC)    1. Acute respiratory distress  2. CAP  3. COPD with exacerbation 4. Hypercarbia    Harvel Quale, MD 05/21/15 [phone]

## 2015-06-02 ENCOUNTER — Ambulatory Visit (INDEPENDENT_AMBULATORY_CARE_PROVIDER_SITE_OTHER): Payer: Medicare Other | Admitting: Adult Health

## 2015-06-02 ENCOUNTER — Encounter: Payer: Self-pay | Admitting: Adult Health

## 2015-06-02 VITALS — BP 132/76 | Temp 98.6°F | Ht 63.0 in | Wt 82.7 lb

## 2015-06-02 DIAGNOSIS — Z23 Encounter for immunization: Secondary | ICD-10-CM | POA: Diagnosis not present

## 2015-06-02 DIAGNOSIS — J449 Chronic obstructive pulmonary disease, unspecified: Secondary | ICD-10-CM | POA: Diagnosis not present

## 2015-06-02 NOTE — Progress Notes (Signed)
Subjective:    Patient ID: Heidi [name], female    DOB: 04-19-1941, 74 y.o.   MRN: [id number]  HPI  74 year old female who presents to the office today for follow up regarding her chronic conditions.   In October 2016 Heidi [name] was admitted to the hospital after being brought to the ER by EMS after family found her with worsening SOB, productive cough and severe weakness. In the ER she was required to be on BiPAP.   Her admitting diagnosis was Acute hypercarbic and hypoxemic respiratory failure secondary to acute COPD exacerbation and community-acquired pneumonia.   She was placed on Home Hospice upon discharge.   She reports that she is feeling much better and does not know if she needs to have Hospice anymore. She is wearing 2.5 l oxygen via [place], but does not like using it. She is taking her nebs and using her inhalers as directed. Most importantly, she has quit smoking since being out of the hospital. She was a 2.5 pack a day smoker and quit cold [place]. She is not having any problems with this.   Her diet is great as family reports. She is eating large meals and has increased energy. She is up 4 pounds in less than a month. Patient states " I am almost back to the old Porsche!"  Wt Readings from Last 3 Encounters:  06/02/15 82 lb 11.2 oz (37.512 kg)  05/13/15 78 lb 3.2 oz (35.471 kg)  04/25/15 81 lb 5.6 oz (36.9 kg)     Review of Systems  Constitutional: Negative.   HENT: Negative.   Eyes: Negative.   Respiratory: Positive for cough, shortness of breath and wheezing.   Cardiovascular: Negative.   Gastrointestinal: Negative.   Genitourinary: Negative.   Musculoskeletal: Positive for myalgias and arthralgias. Negative for joint swelling.  Skin: Negative.   Neurological: Negative.   Hematological: Negative.   All other systems reviewed and are negative.  Past Medical History  Diagnosis Date  . COPD (chronic obstructive pulmonary disease) ([place])   . Hypothyroidism   . Hx of  adenomatous colonic polyps     with high grade dysplasia  . Hyperlipidemia   . Pulmonary nodule   . 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([place])   . Depression   . Cerebrovascular disease   . Positive ANA (antinuclear antibody)   . Diverticulosis   . Esophageal stricture   . Arthritis   . History of pneumonia   . History of stroke     mild oncoming  . Pneumonia     HX of  . Shortness of breath     periodically with COPD flair up  . Internal hemorrhoids   . Rectal ulcer   . Osteoporosis     Social History   Social History  . Marital Status: Widowed    Spouse Name: N/A  . Number of Children: 3  . Years of Education: N/A   Occupational History  . RETIRED    Social History Main Topics  . Smoking status: Former Smoker    Quit date: 11/01/2009  . Smokeless tobacco: Never Used  . Alcohol Use: No  . Drug Use: No  . Sexual Activity: Not Currently   Other Topics Concern  . Not on file   Social History Narrative   Patient has no recent travel. She is smoking 2 packs per day and has smoked since age of 54. Has cats and dogs at home. No bird or mold exposure.     Past  Surgical History  Procedure Laterality Date  . Hiatal hernia repair      x 2  . Cataract extraction      bilateral  . Total abdominal hysterectomy w/ bilateral salpingoophorectomy  1998  . Bladder surgery      tack  . Hemicolectomy  2012    emergent surgery [facility]  . Tonsillectomy    . Colonoscopy  06/22/2011    Procedure: COLONOSCOPY;  Surgeon: Lafayette Dragon, MD;  Location: [facility];  Service: Endoscopy;  Laterality: N/A;  . Gastric varices banding  06/22/2011    Procedure: HEMORRHOID BANDING;  Surgeon: Lafayette Dragon, MD;  Location: [facility];  Service: Endoscopy;  Laterality: N/A;    Family History  Problem Relation Age of Onset  . Colon cancer Mother   . COPD Father   . Heart attack Father   . Stroke Maternal Aunt   . Colon polyps Brother   . Liver disease Maternal Aunt   . Breast cancer  Maternal Aunt   . Hypertension Son   . Hypothyroidism Daughter     Allergies  Allergen Reactions  . Penicillins Hives    Has patient had a PCN reaction causing immediate rash, facial/tongue/throat swelling, SOB or lightheadedness with hypotension: No Has patient had a PCN reaction causing severe rash involving mucus membranes or skin necrosis: No Has patient had a PCN reaction that required hospitalization No Has patient had a PCN reaction occurring within the last 10 years: No If all of the above answers are "NO", then may proceed with Cephalosporin use.    Current Outpatient Prescriptions on File Prior to Visit  Medication Sig Dispense Refill  . albuterol (PROVENTIL HFA;VENTOLIN HFA) 108 (90 BASE) MCG/ACT inhaler Inhale 2 puffs into the lungs every 6 (six) hours as needed for wheezing or shortness of breath. 1 Inhaler 0  . albuterol (PROVENTIL) (2.5 MG/3ML) 0.083% nebulizer solution Take 3 mLs (2.5 mg total) by nebulization every 6 (six) hours as needed for wheezing or shortness of breath. 150 mL 0  . ALPRAZolam (XANAX) 0.5 MG tablet Take 1 tablet (0.5 mg total) by mouth 3 (three) times daily as needed for anxiety or sleep (air hunger. dyspnea). 30 tablet 0  . Fluticasone-Salmeterol (ADVAIR) 100-50 MCG/DOSE AEPB Inhale 1 puff into the lungs 2 (two) times daily. 1 each 3  . furosemide (LASIX) 20 MG tablet Take 1 tablet (20 mg total) by mouth daily as needed for fluid or edema. 30 tablet 0  . levothyroxine (SYNTHROID, LEVOTHROID) 137 MCG tablet Take 1 tablet (137 mcg total) by mouth daily before breakfast. 30 tablet 3  . mirtazapine (REMERON) 15 MG tablet Take 1 tablet (15 mg total) by mouth at bedtime. 30 tablet 5  . tiotropium (SPIRIVA HANDIHALER) 18 MCG inhalation capsule Place 1 capsule (18 mcg total) into inhaler and inhale daily. 30 capsule 12  . halobetasol (ULTRAVATE) 0.05 % ointment Apply topically 2 (two) times daily.      Marland Kitchen morphine (ROXANOL) 20 MG/ML concentrated solution Take  0.5 mLs (10 mg total) by mouth every 2 (two) hours as needed for moderate pain, severe pain, anxiety or shortness of breath. (Patient not taking: Reported on 06/02/2015) 30 mL 0   No current facility-administered medications on file prior to visit.    BP 132/76 mmHg  Temp(Src) 98.6 F (37 C) (Oral)  Ht 5\' 3"  (1.6 m)  Wt 82 lb 11.2 oz (37.512 kg)  BMI 14.65 kg/m2       Objective:  Physical Exam  Constitutional: She is oriented to person, place, and time. She appears well-developed. No distress.  Thin and frail.  She appears happy and energetic  HENT:  Head: Normocephalic and atraumatic.  Right Ear: External ear normal.  Left Ear: External ear normal.  Nose: Nose normal.  Mouth/Throat: Oropharynx is clear and moist. No oropharyngeal exudate.  Cardiovascular: Normal rate, regular rhythm, normal heart sounds and intact distal pulses.  Exam reveals no gallop.   No murmur heard. Pulmonary/Chest: Effort normal. No respiratory distress. She has wheezes (diffuse). She has no rales. She exhibits no tenderness.  Musculoskeletal: She exhibits no edema.  Neurological: She is alert and oriented to person, place, and time.  Skin: Skin is warm and dry. No rash noted. She is not diaphoretic. No erythema. No pallor.  Psychiatric: She has a normal mood and affect. Her behavior is normal. Judgment and thought content normal.  Nursing note and vitals reviewed.      Assessment & Plan:  1. Chronic obstructive pulmonary disease, unspecified COPD type ([place]) - I advised to continue to wear her oxygen at all times, especially with exertion and when sleeping. She would like to take it off during relaxation periods, I am ok with this if she monitors her pulse ox, which she does at home. She is to return to oxygen therapy if she feels SOB.  - Continue with nebs and inhalers as directed.  - Use incentive spirometer.   - Continue to eat with no dietary restrictions - Follow up after the holidays or  sooner if needed  2. Need for pneumococcal vaccine - Pneumococcal conjugate vaccine 13-valent IM  3. Encounter for immunization - High dose flu given

## 2015-06-02 NOTE — Patient Instructions (Signed)
Heidi [name],   It was great seeing you today! I am so happy you are doing much better.   You can trial having the oxygen off when you are home and watching tv or relaxing. Keep an eye on your oxygen levels and if needed place the oxygen back on. During activity and sleep I would like you to have your oxygen on. Continue with the nebulizers and inhalers, they are helping with your breathing.   Follow up with me after the holidays or sooner if needed.   Please, enjoy Thanksgiving and Christmas! I wish you and your family a happy new year.

## 2015-06-24 ENCOUNTER — Other Ambulatory Visit: Payer: Self-pay | Admitting: Adult Health

## 2015-06-24 ENCOUNTER — Telehealth: Payer: Self-pay | Admitting: Adult Health

## 2015-06-24 MED ORDER — AZITHROMYCIN 250 MG PO TABS: ORAL_TABLET | ORAL | Status: DC

## 2015-06-24 NOTE — Telephone Encounter (Signed)
Juliann Pulse with [facility] calling to report pt has been running a fever between 100 -100.5 accompanied with a cough and a raspy, sore throat for the last 3-4 days.  Needs an order to start a z pack or levequin if approved by PCP.

## 2015-06-24 NOTE — Telephone Encounter (Signed)
Called and spoke with Juliann Pulse and advised that zpak was sent to pharmacy.

## 2015-06-24 NOTE — Telephone Encounter (Signed)
Z pack sent in to [facility]

## 2015-07-02 ENCOUNTER — Ambulatory Visit: Payer: Medicare Other | Admitting: Adult Health

## 2015-07-05 ENCOUNTER — Ambulatory Visit: Payer: Medicare Other | Admitting: Adult Health

## 2015-07-15 ENCOUNTER — Telehealth: Payer: Self-pay | Admitting: Adult Health

## 2015-07-15 NOTE — Telephone Encounter (Signed)
Pt was last seen on 06/02/2015. Please advise on medication changes.

## 2015-07-15 NOTE — Telephone Encounter (Signed)
Brooke rn from [facility] would like something stronger than tylenol for this pt. Please fax rx [phone]. Pt is having head pain. Jerene Pitch would like rx fax over today

## 2015-07-15 NOTE — Telephone Encounter (Signed)
Her med list has Roxanol.  Is she not on this?  Please clarify.  This pt is not familiar to me.  She might be a good candidate to have Hospice based physician do an asessement/consult.

## 2015-07-16 MED ORDER — OXYCODONE-ACETAMINOPHEN 5-325 MG PO TABS: ORAL_TABLET | ORAL | Status: DC

## 2015-07-16 NOTE — Telephone Encounter (Signed)
Let's try Percocet 5/325 mg 1-2 po q 6 hours prn pain #30

## 2015-07-16 NOTE — Telephone Encounter (Signed)
Alisha verbally spoke with Hospice and Dr. Elease Hashimoto. Dr Elease Hashimoto was agreeable to have pt take Oxycodone for her pain prn. I have faxed the RX to two separate locations to be filled. Hospice is aware of this.

## 2015-07-16 NOTE — Telephone Encounter (Signed)
Per Jerene Pitch pt has not tried any other pain medication.  There are no other allergies to report.  Ok per Dr. Elease Hashimoto to send in percocet 5-325 take 1 to 2 every 6 hours #30 with 0 rf.  Per Jerene Pitch send to [facility] and to hospice fax at [phone]

## 2015-07-16 NOTE — Telephone Encounter (Signed)
Brooke returned call and states that pt does have Roxanol to use prn.  Last week pt's head hurt so bad she was given a dose but it made her extremely sick and pt refuses to use it again.  Jerene Pitch states pt would like a step above the Tylenol for her headache.  Advised of Dr. Erick Blinks recommendations and she states that on pt's initial paperwork Dr. Elease Hashimoto and Tommi Rumps signed saying they would be attending physician.  After the new year we can have the paperwork faxed back over to change this orders if we need to and Dr. Carter Kitten can do symptom management.  If Jerene Pitch is not contacted today the on call line for the weekend is [phone]. Pls advise.

## 2015-07-16 NOTE — Telephone Encounter (Signed)
Left a message for return call.  

## 2015-07-21 ENCOUNTER — Ambulatory Visit: Payer: Medicare Other | Admitting: Adult Health

## 2015-07-26 ENCOUNTER — Ambulatory Visit: Payer: Medicare Other | Admitting: Adult Health

## 2015-07-29 ENCOUNTER — Other Ambulatory Visit: Payer: Self-pay | Admitting: Adult Health

## 2015-07-30 ENCOUNTER — Ambulatory Visit: Payer: Medicare Other | Admitting: Adult Health

## 2015-08-02 ENCOUNTER — Ambulatory Visit (INDEPENDENT_AMBULATORY_CARE_PROVIDER_SITE_OTHER): Payer: Medicare Other | Admitting: Adult Health

## 2015-08-02 ENCOUNTER — Encounter: Payer: Self-pay | Admitting: Adult Health

## 2015-08-02 VITALS — BP 136/80 | HR 100 | Temp 97.3°F | Ht 63.0 in | Wt 97.0 lb

## 2015-08-02 DIAGNOSIS — R51 Headache: Secondary | ICD-10-CM | POA: Diagnosis not present

## 2015-08-02 DIAGNOSIS — Z09 Encounter for follow-up examination after completed treatment for conditions other than malignant neoplasm: Secondary | ICD-10-CM

## 2015-08-02 DIAGNOSIS — R519 Headache, unspecified: Secondary | ICD-10-CM

## 2015-08-02 NOTE — Patient Instructions (Addendum)
Heidi [name],   As always, it was great seeing you!  Try using OTC Migraine medication for your headaches and get your eyes checked. If you continue to have headaches, please let me know.   Follow up in 2 months     General Headache Without Cause A headache is pain or discomfort felt around the head or neck area. The specific cause of a headache may not be found. There are many causes and types of headaches. A few common ones are:  Tension headaches.  Migraine headaches.  Cluster headaches.  Chronic daily headaches. HOME CARE INSTRUCTIONS  Watch your condition for any changes. Take these steps to help with your condition: Managing Pain  Take over-the-counter and prescription medicines only as told by your health care provider.  Lie down in a dark, quiet room when you have a headache.  If directed, apply ice to the head and neck area:  Put ice in a plastic bag.  Place a towel between your skin and the bag.  Leave the ice on for 20 minutes, 2-3 times per day.  Use a heating pad or hot shower to apply heat to the head and neck area as told by your health care provider.  Keep lights dim if bright lights bother you or make your headaches worse. Eating and Drinking  Eat meals on a regular schedule.  Limit alcohol use.  Decrease the amount of caffeine you drink, or stop drinking caffeine. General Instructions  Keep all follow-up visits as told by your health care provider. This is important.  Keep a headache journal to help find out what may trigger your headaches. For example, write down:  What you eat and drink.  How much sleep you get.  Any change to your diet or medicines.  Try massage or other relaxation techniques.  Limit stress.  Sit up straight, and do not tense your muscles.  Do not use tobacco products, including cigarettes, chewing tobacco, or e-cigarettes. If you need help quitting, ask your health care provider.  Exercise regularly as told by your  health care provider.  Sleep on a regular schedule. Get 7-9 hours of sleep, or the amount recommended by your health care provider. SEEK MEDICAL CARE IF:   Your symptoms are not helped by medicine.  You have a headache that is different from the usual headache.  You have nausea or you vomit.  You have a fever. SEEK IMMEDIATE MEDICAL CARE IF:   Your headache becomes severe.  You have repeated vomiting.  You have a stiff neck.  You have a loss of vision.  You have problems with speech.  You have pain in the eye or ear.  You have muscular weakness or loss of muscle control.  You lose your balance or have trouble walking.  You feel faint or pass out.  You have confusion.   This information is not intended to replace advice given to you by your health care provider. Make sure you discuss any questions you have with your health care provider.   Document Released: 07/03/2005 Document Revised: 03/24/2015 Document Reviewed: 10/26/2014 Elsevier Interactive Patient Education [employer].

## 2015-08-02 NOTE — Progress Notes (Signed)
Pre visit review using our clinic review tool, if applicable. No additional management support is needed unless otherwise documented below in the visit note. 

## 2015-08-02 NOTE — Progress Notes (Signed)
   Subjective:    Patient ID: Heidi [name], female    DOB: [birth year], 75 y.o.   MRN: [id number]  HPI  75 year old female who presents to the office today for follow up. I last saw her on 06/02/2015. In October 2016 she was seen in the hospital for ARF secondary to COPD and was placed on hospice care. During the last visit she endorses ": feeling pretty well". She continues with that feeling today. Her only concern is that of a constant headache but does not have any blurred vision. She has been using tylenol with slight relief.   She reports that she is gaining weight and her appetite it good. She continues to be under the care of hospice and feels as though this is going well.   She is looking at retirement facilities to live in, as she feels this would be a better option for her then living at home.   Currently she is on 2.5 liters of oxygen via nasal canula.   Wt Readings from Last 3 Encounters:  08/02/15 97 lb (43.999 kg)  06/02/15 82 lb 11.2 oz (37.512 kg)  05/13/15 78 lb 3.2 oz (35.471 kg)     Review of Systems  Constitutional: Negative.   HENT: Negative.   Respiratory: Positive for shortness of breath. Negative for cough, choking, chest tightness and wheezing.   Cardiovascular: Negative.   Gastrointestinal: Negative.   Endocrine: Negative.   Genitourinary: Negative.   Musculoskeletal: Positive for myalgias and arthralgias.  Skin: Negative.   Allergic/Immunologic: Negative.   Neurological: Negative.   Hematological: Negative.   All other systems reviewed and are negative.      Objective:   Physical Exam  Constitutional: She is oriented to person, place, and time. She appears well-developed and well-nourished. No distress.  frail  HENT:  Head: Normocephalic and atraumatic.  Right Ear: External ear normal.  Left Ear: External ear normal.  Nose: Nose normal.  Mouth/Throat: Oropharynx is clear and moist.  Neck: Normal range of motion. Neck supple. No thyromegaly  present.  Cardiovascular: Normal rate, regular rhythm, normal heart sounds and intact distal pulses.  Exam reveals no gallop and no friction rub.   No murmur heard. Pulmonary/Chest: Effort normal and breath sounds normal. No respiratory distress. She has no wheezes. She has no rales. She exhibits no tenderness.  Musculoskeletal: Normal range of motion. She exhibits no edema or tenderness.  Lymphadenopathy:    She has no cervical adenopathy.  Neurological: She is alert and oriented to person, place, and time. She has normal reflexes.  Skin: Skin is warm and dry. No rash noted. She is not diaphoretic. No erythema. No pallor.  Psychiatric: She has a normal mood and affect. Her behavior is normal. Judgment and thought content normal.  Joyful   Nursing note and vitals reviewed.      Assessment & Plan:  1. Acute nonintractable headache, unspecified headache type - OTC Excedrin migraine - Make an appointment for eye exam - Follow up if no improvement.   2. Follow-up exam - Heidi [name] looks great during this exam.  - Continue with current medications - Continue with hospice care.  - Follow up in 2 months or sooner if needed

## 2015-08-18 DIAGNOSIS — J189 Pneumonia, unspecified organism: Secondary | ICD-10-CM | POA: Diagnosis not present

## 2015-08-18 DIAGNOSIS — J441 Chronic obstructive pulmonary disease with (acute) exacerbation: Secondary | ICD-10-CM | POA: Diagnosis not present

## 2015-08-18 DIAGNOSIS — I509 Heart failure, unspecified: Secondary | ICD-10-CM | POA: Diagnosis not present

## 2015-08-18 DIAGNOSIS — Z8673 Personal history of transient ischemic attack (TIA), and cerebral infarction without residual deficits: Secondary | ICD-10-CM | POA: Diagnosis not present

## 2015-08-18 DIAGNOSIS — Z8739 Personal history of other diseases of the musculoskeletal system and connective tissue: Secondary | ICD-10-CM | POA: Diagnosis not present

## 2015-08-18 DIAGNOSIS — Z681 Body mass index (BMI) 19 or less, adult: Secondary | ICD-10-CM | POA: Diagnosis not present

## 2015-08-18 DIAGNOSIS — Z8639 Personal history of other endocrine, nutritional and metabolic disease: Secondary | ICD-10-CM | POA: Diagnosis not present

## 2015-08-25 DIAGNOSIS — J441 Chronic obstructive pulmonary disease with (acute) exacerbation: Secondary | ICD-10-CM | POA: Diagnosis not present

## 2015-08-25 DIAGNOSIS — J189 Pneumonia, unspecified organism: Secondary | ICD-10-CM | POA: Diagnosis not present

## 2015-08-25 DIAGNOSIS — Z8673 Personal history of transient ischemic attack (TIA), and cerebral infarction without residual deficits: Secondary | ICD-10-CM | POA: Diagnosis not present

## 2015-08-25 DIAGNOSIS — Z8639 Personal history of other endocrine, nutritional and metabolic disease: Secondary | ICD-10-CM | POA: Diagnosis not present

## 2015-08-25 DIAGNOSIS — Z681 Body mass index (BMI) 19 or less, adult: Secondary | ICD-10-CM | POA: Diagnosis not present

## 2015-08-25 DIAGNOSIS — I509 Heart failure, unspecified: Secondary | ICD-10-CM | POA: Diagnosis not present

## 2015-09-01 ENCOUNTER — Other Ambulatory Visit: Payer: Self-pay | Admitting: Adult Health

## 2015-09-01 DIAGNOSIS — J189 Pneumonia, unspecified organism: Secondary | ICD-10-CM | POA: Diagnosis not present

## 2015-09-01 DIAGNOSIS — I509 Heart failure, unspecified: Secondary | ICD-10-CM | POA: Diagnosis not present

## 2015-09-01 DIAGNOSIS — Z8639 Personal history of other endocrine, nutritional and metabolic disease: Secondary | ICD-10-CM | POA: Diagnosis not present

## 2015-09-01 DIAGNOSIS — Z681 Body mass index (BMI) 19 or less, adult: Secondary | ICD-10-CM | POA: Diagnosis not present

## 2015-09-01 DIAGNOSIS — J441 Chronic obstructive pulmonary disease with (acute) exacerbation: Secondary | ICD-10-CM | POA: Diagnosis not present

## 2015-09-01 DIAGNOSIS — Z8673 Personal history of transient ischemic attack (TIA), and cerebral infarction without residual deficits: Secondary | ICD-10-CM | POA: Diagnosis not present

## 2015-09-01 MED ORDER — GABAPENTIN 300 MG PO CAPS: 300.0000 mg | ORAL_CAPSULE | Freq: Two times a day (BID) | ORAL | Status: DC

## 2015-09-01 NOTE — Telephone Encounter (Signed)
Rx was faxed.

## 2015-09-01 NOTE — Telephone Encounter (Signed)
That is fine. Gabapentin 300mg  BID, 30 days, 60 pills, 2 refills. Let us know if this does not work.

## 2015-09-01 NOTE — Telephone Encounter (Signed)
Pia Mau of hospice/ [facility] called to ask for a new med consideration for pt.  Pt has been expericing some leg and foot pain at night. Pt states it is quite severe at time. Jenny Reichmann is thinking this may be nerve pain and would like to know if Tommi Rumps can prescribe  gabapentin/neurotin   If ok please fax script to fax:  [phone]

## 2015-09-03 DIAGNOSIS — J189 Pneumonia, unspecified organism: Secondary | ICD-10-CM | POA: Diagnosis not present

## 2015-09-03 DIAGNOSIS — Z8673 Personal history of transient ischemic attack (TIA), and cerebral infarction without residual deficits: Secondary | ICD-10-CM | POA: Diagnosis not present

## 2015-09-03 DIAGNOSIS — Z8639 Personal history of other endocrine, nutritional and metabolic disease: Secondary | ICD-10-CM | POA: Diagnosis not present

## 2015-09-03 DIAGNOSIS — J441 Chronic obstructive pulmonary disease with (acute) exacerbation: Secondary | ICD-10-CM | POA: Diagnosis not present

## 2015-09-03 DIAGNOSIS — I509 Heart failure, unspecified: Secondary | ICD-10-CM | POA: Diagnosis not present

## 2015-09-03 DIAGNOSIS — Z681 Body mass index (BMI) 19 or less, adult: Secondary | ICD-10-CM | POA: Diagnosis not present

## 2015-09-07 DIAGNOSIS — J189 Pneumonia, unspecified organism: Secondary | ICD-10-CM | POA: Diagnosis not present

## 2015-09-07 DIAGNOSIS — I509 Heart failure, unspecified: Secondary | ICD-10-CM | POA: Diagnosis not present

## 2015-09-07 DIAGNOSIS — J441 Chronic obstructive pulmonary disease with (acute) exacerbation: Secondary | ICD-10-CM | POA: Diagnosis not present

## 2015-09-07 DIAGNOSIS — Z8639 Personal history of other endocrine, nutritional and metabolic disease: Secondary | ICD-10-CM | POA: Diagnosis not present

## 2015-09-07 DIAGNOSIS — Z8673 Personal history of transient ischemic attack (TIA), and cerebral infarction without residual deficits: Secondary | ICD-10-CM | POA: Diagnosis not present

## 2015-09-07 DIAGNOSIS — Z681 Body mass index (BMI) 19 or less, adult: Secondary | ICD-10-CM | POA: Diagnosis not present

## 2015-09-08 DIAGNOSIS — J441 Chronic obstructive pulmonary disease with (acute) exacerbation: Secondary | ICD-10-CM | POA: Diagnosis not present

## 2015-09-08 DIAGNOSIS — Z681 Body mass index (BMI) 19 or less, adult: Secondary | ICD-10-CM | POA: Diagnosis not present

## 2015-09-08 DIAGNOSIS — Z8639 Personal history of other endocrine, nutritional and metabolic disease: Secondary | ICD-10-CM | POA: Diagnosis not present

## 2015-09-08 DIAGNOSIS — I509 Heart failure, unspecified: Secondary | ICD-10-CM | POA: Diagnosis not present

## 2015-09-08 DIAGNOSIS — J189 Pneumonia, unspecified organism: Secondary | ICD-10-CM | POA: Diagnosis not present

## 2015-09-08 DIAGNOSIS — Z8673 Personal history of transient ischemic attack (TIA), and cerebral infarction without residual deficits: Secondary | ICD-10-CM | POA: Diagnosis not present

## 2015-09-12 DIAGNOSIS — J441 Chronic obstructive pulmonary disease with (acute) exacerbation: Secondary | ICD-10-CM | POA: Diagnosis not present

## 2015-09-12 DIAGNOSIS — Z8639 Personal history of other endocrine, nutritional and metabolic disease: Secondary | ICD-10-CM | POA: Diagnosis not present

## 2015-09-12 DIAGNOSIS — J189 Pneumonia, unspecified organism: Secondary | ICD-10-CM | POA: Diagnosis not present

## 2015-09-12 DIAGNOSIS — Z681 Body mass index (BMI) 19 or less, adult: Secondary | ICD-10-CM | POA: Diagnosis not present

## 2015-09-12 DIAGNOSIS — Z8673 Personal history of transient ischemic attack (TIA), and cerebral infarction without residual deficits: Secondary | ICD-10-CM | POA: Diagnosis not present

## 2015-09-12 DIAGNOSIS — I509 Heart failure, unspecified: Secondary | ICD-10-CM | POA: Diagnosis not present

## 2015-09-13 DIAGNOSIS — I509 Heart failure, unspecified: Secondary | ICD-10-CM | POA: Diagnosis not present

## 2015-09-13 DIAGNOSIS — J189 Pneumonia, unspecified organism: Secondary | ICD-10-CM | POA: Diagnosis not present

## 2015-09-13 DIAGNOSIS — Z681 Body mass index (BMI) 19 or less, adult: Secondary | ICD-10-CM | POA: Diagnosis not present

## 2015-09-13 DIAGNOSIS — Z8639 Personal history of other endocrine, nutritional and metabolic disease: Secondary | ICD-10-CM | POA: Diagnosis not present

## 2015-09-13 DIAGNOSIS — Z8673 Personal history of transient ischemic attack (TIA), and cerebral infarction without residual deficits: Secondary | ICD-10-CM | POA: Diagnosis not present

## 2015-09-13 DIAGNOSIS — J441 Chronic obstructive pulmonary disease with (acute) exacerbation: Secondary | ICD-10-CM | POA: Diagnosis not present

## 2015-09-15 DIAGNOSIS — J441 Chronic obstructive pulmonary disease with (acute) exacerbation: Secondary | ICD-10-CM | POA: Diagnosis not present

## 2015-09-15 DIAGNOSIS — I509 Heart failure, unspecified: Secondary | ICD-10-CM | POA: Diagnosis not present

## 2015-09-15 DIAGNOSIS — Z8673 Personal history of transient ischemic attack (TIA), and cerebral infarction without residual deficits: Secondary | ICD-10-CM | POA: Diagnosis not present

## 2015-09-15 DIAGNOSIS — Z681 Body mass index (BMI) 19 or less, adult: Secondary | ICD-10-CM | POA: Diagnosis not present

## 2015-09-15 DIAGNOSIS — J189 Pneumonia, unspecified organism: Secondary | ICD-10-CM | POA: Diagnosis not present

## 2015-09-15 DIAGNOSIS — Z8639 Personal history of other endocrine, nutritional and metabolic disease: Secondary | ICD-10-CM | POA: Diagnosis not present

## 2015-09-15 DIAGNOSIS — Z8739 Personal history of other diseases of the musculoskeletal system and connective tissue: Secondary | ICD-10-CM | POA: Diagnosis not present

## 2015-09-20 DIAGNOSIS — Z681 Body mass index (BMI) 19 or less, adult: Secondary | ICD-10-CM | POA: Diagnosis not present

## 2015-09-20 DIAGNOSIS — J189 Pneumonia, unspecified organism: Secondary | ICD-10-CM | POA: Diagnosis not present

## 2015-09-20 DIAGNOSIS — I509 Heart failure, unspecified: Secondary | ICD-10-CM | POA: Diagnosis not present

## 2015-09-20 DIAGNOSIS — J441 Chronic obstructive pulmonary disease with (acute) exacerbation: Secondary | ICD-10-CM | POA: Diagnosis not present

## 2015-09-20 DIAGNOSIS — Z8673 Personal history of transient ischemic attack (TIA), and cerebral infarction without residual deficits: Secondary | ICD-10-CM | POA: Diagnosis not present

## 2015-09-20 DIAGNOSIS — Z8639 Personal history of other endocrine, nutritional and metabolic disease: Secondary | ICD-10-CM | POA: Diagnosis not present

## 2015-09-23 DIAGNOSIS — I509 Heart failure, unspecified: Secondary | ICD-10-CM | POA: Diagnosis not present

## 2015-09-23 DIAGNOSIS — Z8639 Personal history of other endocrine, nutritional and metabolic disease: Secondary | ICD-10-CM | POA: Diagnosis not present

## 2015-09-23 DIAGNOSIS — Z681 Body mass index (BMI) 19 or less, adult: Secondary | ICD-10-CM | POA: Diagnosis not present

## 2015-09-23 DIAGNOSIS — J441 Chronic obstructive pulmonary disease with (acute) exacerbation: Secondary | ICD-10-CM | POA: Diagnosis not present

## 2015-09-23 DIAGNOSIS — J189 Pneumonia, unspecified organism: Secondary | ICD-10-CM | POA: Diagnosis not present

## 2015-09-23 DIAGNOSIS — Z8673 Personal history of transient ischemic attack (TIA), and cerebral infarction without residual deficits: Secondary | ICD-10-CM | POA: Diagnosis not present

## 2015-09-27 DIAGNOSIS — J441 Chronic obstructive pulmonary disease with (acute) exacerbation: Secondary | ICD-10-CM | POA: Diagnosis not present

## 2015-09-27 DIAGNOSIS — Z8639 Personal history of other endocrine, nutritional and metabolic disease: Secondary | ICD-10-CM | POA: Diagnosis not present

## 2015-09-27 DIAGNOSIS — Z8673 Personal history of transient ischemic attack (TIA), and cerebral infarction without residual deficits: Secondary | ICD-10-CM | POA: Diagnosis not present

## 2015-09-27 DIAGNOSIS — J189 Pneumonia, unspecified organism: Secondary | ICD-10-CM | POA: Diagnosis not present

## 2015-09-27 DIAGNOSIS — Z681 Body mass index (BMI) 19 or less, adult: Secondary | ICD-10-CM | POA: Diagnosis not present

## 2015-09-27 DIAGNOSIS — I509 Heart failure, unspecified: Secondary | ICD-10-CM | POA: Diagnosis not present

## 2015-10-01 DIAGNOSIS — Z8639 Personal history of other endocrine, nutritional and metabolic disease: Secondary | ICD-10-CM | POA: Diagnosis not present

## 2015-10-01 DIAGNOSIS — J441 Chronic obstructive pulmonary disease with (acute) exacerbation: Secondary | ICD-10-CM | POA: Diagnosis not present

## 2015-10-01 DIAGNOSIS — J189 Pneumonia, unspecified organism: Secondary | ICD-10-CM | POA: Diagnosis not present

## 2015-10-01 DIAGNOSIS — Z681 Body mass index (BMI) 19 or less, adult: Secondary | ICD-10-CM | POA: Diagnosis not present

## 2015-10-01 DIAGNOSIS — I509 Heart failure, unspecified: Secondary | ICD-10-CM | POA: Diagnosis not present

## 2015-10-01 DIAGNOSIS — Z8673 Personal history of transient ischemic attack (TIA), and cerebral infarction without residual deficits: Secondary | ICD-10-CM | POA: Diagnosis not present

## 2015-10-04 DIAGNOSIS — I509 Heart failure, unspecified: Secondary | ICD-10-CM | POA: Diagnosis not present

## 2015-10-04 DIAGNOSIS — J441 Chronic obstructive pulmonary disease with (acute) exacerbation: Secondary | ICD-10-CM | POA: Diagnosis not present

## 2015-10-04 DIAGNOSIS — Z681 Body mass index (BMI) 19 or less, adult: Secondary | ICD-10-CM | POA: Diagnosis not present

## 2015-10-04 DIAGNOSIS — J189 Pneumonia, unspecified organism: Secondary | ICD-10-CM | POA: Diagnosis not present

## 2015-10-04 DIAGNOSIS — Z8673 Personal history of transient ischemic attack (TIA), and cerebral infarction without residual deficits: Secondary | ICD-10-CM | POA: Diagnosis not present

## 2015-10-04 DIAGNOSIS — Z8639 Personal history of other endocrine, nutritional and metabolic disease: Secondary | ICD-10-CM | POA: Diagnosis not present

## 2015-10-07 DIAGNOSIS — Z8639 Personal history of other endocrine, nutritional and metabolic disease: Secondary | ICD-10-CM | POA: Diagnosis not present

## 2015-10-07 DIAGNOSIS — Z681 Body mass index (BMI) 19 or less, adult: Secondary | ICD-10-CM | POA: Diagnosis not present

## 2015-10-07 DIAGNOSIS — J441 Chronic obstructive pulmonary disease with (acute) exacerbation: Secondary | ICD-10-CM | POA: Diagnosis not present

## 2015-10-07 DIAGNOSIS — I509 Heart failure, unspecified: Secondary | ICD-10-CM | POA: Diagnosis not present

## 2015-10-07 DIAGNOSIS — J189 Pneumonia, unspecified organism: Secondary | ICD-10-CM | POA: Diagnosis not present

## 2015-10-07 DIAGNOSIS — Z8673 Personal history of transient ischemic attack (TIA), and cerebral infarction without residual deficits: Secondary | ICD-10-CM | POA: Diagnosis not present

## 2015-10-12 DIAGNOSIS — Z8639 Personal history of other endocrine, nutritional and metabolic disease: Secondary | ICD-10-CM | POA: Diagnosis not present

## 2015-10-12 DIAGNOSIS — I509 Heart failure, unspecified: Secondary | ICD-10-CM | POA: Diagnosis not present

## 2015-10-12 DIAGNOSIS — J189 Pneumonia, unspecified organism: Secondary | ICD-10-CM | POA: Diagnosis not present

## 2015-10-12 DIAGNOSIS — Z681 Body mass index (BMI) 19 or less, adult: Secondary | ICD-10-CM | POA: Diagnosis not present

## 2015-10-12 DIAGNOSIS — Z8673 Personal history of transient ischemic attack (TIA), and cerebral infarction without residual deficits: Secondary | ICD-10-CM | POA: Diagnosis not present

## 2015-10-12 DIAGNOSIS — J441 Chronic obstructive pulmonary disease with (acute) exacerbation: Secondary | ICD-10-CM | POA: Diagnosis not present

## 2015-10-13 DIAGNOSIS — J441 Chronic obstructive pulmonary disease with (acute) exacerbation: Secondary | ICD-10-CM | POA: Diagnosis not present

## 2015-10-13 DIAGNOSIS — Z8639 Personal history of other endocrine, nutritional and metabolic disease: Secondary | ICD-10-CM | POA: Diagnosis not present

## 2015-10-13 DIAGNOSIS — Z681 Body mass index (BMI) 19 or less, adult: Secondary | ICD-10-CM | POA: Diagnosis not present

## 2015-10-13 DIAGNOSIS — I509 Heart failure, unspecified: Secondary | ICD-10-CM | POA: Diagnosis not present

## 2015-10-13 DIAGNOSIS — Z8673 Personal history of transient ischemic attack (TIA), and cerebral infarction without residual deficits: Secondary | ICD-10-CM | POA: Diagnosis not present

## 2015-10-13 DIAGNOSIS — J189 Pneumonia, unspecified organism: Secondary | ICD-10-CM | POA: Diagnosis not present

## 2015-10-14 DIAGNOSIS — Z8673 Personal history of transient ischemic attack (TIA), and cerebral infarction without residual deficits: Secondary | ICD-10-CM | POA: Diagnosis not present

## 2015-10-14 DIAGNOSIS — I509 Heart failure, unspecified: Secondary | ICD-10-CM | POA: Diagnosis not present

## 2015-10-14 DIAGNOSIS — J441 Chronic obstructive pulmonary disease with (acute) exacerbation: Secondary | ICD-10-CM | POA: Diagnosis not present

## 2015-10-14 DIAGNOSIS — J189 Pneumonia, unspecified organism: Secondary | ICD-10-CM | POA: Diagnosis not present

## 2015-10-14 DIAGNOSIS — Z681 Body mass index (BMI) 19 or less, adult: Secondary | ICD-10-CM | POA: Diagnosis not present

## 2015-10-14 DIAGNOSIS — Z8639 Personal history of other endocrine, nutritional and metabolic disease: Secondary | ICD-10-CM | POA: Diagnosis not present

## 2015-10-16 DIAGNOSIS — Z8639 Personal history of other endocrine, nutritional and metabolic disease: Secondary | ICD-10-CM | POA: Diagnosis not present

## 2015-10-16 DIAGNOSIS — Z8673 Personal history of transient ischemic attack (TIA), and cerebral infarction without residual deficits: Secondary | ICD-10-CM | POA: Diagnosis not present

## 2015-10-16 DIAGNOSIS — Z681 Body mass index (BMI) 19 or less, adult: Secondary | ICD-10-CM | POA: Diagnosis not present

## 2015-10-16 DIAGNOSIS — J189 Pneumonia, unspecified organism: Secondary | ICD-10-CM | POA: Diagnosis not present

## 2015-10-16 DIAGNOSIS — Z8739 Personal history of other diseases of the musculoskeletal system and connective tissue: Secondary | ICD-10-CM | POA: Diagnosis not present

## 2015-10-16 DIAGNOSIS — J441 Chronic obstructive pulmonary disease with (acute) exacerbation: Secondary | ICD-10-CM | POA: Diagnosis not present

## 2015-10-16 DIAGNOSIS — I509 Heart failure, unspecified: Secondary | ICD-10-CM | POA: Diagnosis not present

## 2015-10-19 DIAGNOSIS — Z8639 Personal history of other endocrine, nutritional and metabolic disease: Secondary | ICD-10-CM | POA: Diagnosis not present

## 2015-10-19 DIAGNOSIS — J189 Pneumonia, unspecified organism: Secondary | ICD-10-CM | POA: Diagnosis not present

## 2015-10-19 DIAGNOSIS — Z681 Body mass index (BMI) 19 or less, adult: Secondary | ICD-10-CM | POA: Diagnosis not present

## 2015-10-19 DIAGNOSIS — I509 Heart failure, unspecified: Secondary | ICD-10-CM | POA: Diagnosis not present

## 2015-10-19 DIAGNOSIS — J441 Chronic obstructive pulmonary disease with (acute) exacerbation: Secondary | ICD-10-CM | POA: Diagnosis not present

## 2015-10-19 DIAGNOSIS — Z8673 Personal history of transient ischemic attack (TIA), and cerebral infarction without residual deficits: Secondary | ICD-10-CM | POA: Diagnosis not present

## 2015-10-21 DIAGNOSIS — Z681 Body mass index (BMI) 19 or less, adult: Secondary | ICD-10-CM | POA: Diagnosis not present

## 2015-10-21 DIAGNOSIS — Z8639 Personal history of other endocrine, nutritional and metabolic disease: Secondary | ICD-10-CM | POA: Diagnosis not present

## 2015-10-21 DIAGNOSIS — I509 Heart failure, unspecified: Secondary | ICD-10-CM | POA: Diagnosis not present

## 2015-10-21 DIAGNOSIS — J441 Chronic obstructive pulmonary disease with (acute) exacerbation: Secondary | ICD-10-CM | POA: Diagnosis not present

## 2015-10-21 DIAGNOSIS — J189 Pneumonia, unspecified organism: Secondary | ICD-10-CM | POA: Diagnosis not present

## 2015-10-21 DIAGNOSIS — Z8673 Personal history of transient ischemic attack (TIA), and cerebral infarction without residual deficits: Secondary | ICD-10-CM | POA: Diagnosis not present

## 2015-10-27 DIAGNOSIS — Z8639 Personal history of other endocrine, nutritional and metabolic disease: Secondary | ICD-10-CM | POA: Diagnosis not present

## 2015-10-27 DIAGNOSIS — Z681 Body mass index (BMI) 19 or less, adult: Secondary | ICD-10-CM | POA: Diagnosis not present

## 2015-10-27 DIAGNOSIS — J189 Pneumonia, unspecified organism: Secondary | ICD-10-CM | POA: Diagnosis not present

## 2015-10-27 DIAGNOSIS — I509 Heart failure, unspecified: Secondary | ICD-10-CM | POA: Diagnosis not present

## 2015-10-27 DIAGNOSIS — J441 Chronic obstructive pulmonary disease with (acute) exacerbation: Secondary | ICD-10-CM | POA: Diagnosis not present

## 2015-10-27 DIAGNOSIS — Z8673 Personal history of transient ischemic attack (TIA), and cerebral infarction without residual deficits: Secondary | ICD-10-CM | POA: Diagnosis not present

## 2015-10-28 DIAGNOSIS — Z8639 Personal history of other endocrine, nutritional and metabolic disease: Secondary | ICD-10-CM | POA: Diagnosis not present

## 2015-10-28 DIAGNOSIS — I509 Heart failure, unspecified: Secondary | ICD-10-CM | POA: Diagnosis not present

## 2015-10-28 DIAGNOSIS — Z8673 Personal history of transient ischemic attack (TIA), and cerebral infarction without residual deficits: Secondary | ICD-10-CM | POA: Diagnosis not present

## 2015-10-28 DIAGNOSIS — Z681 Body mass index (BMI) 19 or less, adult: Secondary | ICD-10-CM | POA: Diagnosis not present

## 2015-10-28 DIAGNOSIS — J189 Pneumonia, unspecified organism: Secondary | ICD-10-CM | POA: Diagnosis not present

## 2015-10-28 DIAGNOSIS — J441 Chronic obstructive pulmonary disease with (acute) exacerbation: Secondary | ICD-10-CM | POA: Diagnosis not present

## 2015-11-01 DIAGNOSIS — Z8639 Personal history of other endocrine, nutritional and metabolic disease: Secondary | ICD-10-CM | POA: Diagnosis not present

## 2015-11-01 DIAGNOSIS — J441 Chronic obstructive pulmonary disease with (acute) exacerbation: Secondary | ICD-10-CM | POA: Diagnosis not present

## 2015-11-01 DIAGNOSIS — I509 Heart failure, unspecified: Secondary | ICD-10-CM | POA: Diagnosis not present

## 2015-11-01 DIAGNOSIS — J189 Pneumonia, unspecified organism: Secondary | ICD-10-CM | POA: Diagnosis not present

## 2015-11-01 DIAGNOSIS — Z8673 Personal history of transient ischemic attack (TIA), and cerebral infarction without residual deficits: Secondary | ICD-10-CM | POA: Diagnosis not present

## 2015-11-01 DIAGNOSIS — Z681 Body mass index (BMI) 19 or less, adult: Secondary | ICD-10-CM | POA: Diagnosis not present

## 2015-11-02 DIAGNOSIS — I509 Heart failure, unspecified: Secondary | ICD-10-CM | POA: Diagnosis not present

## 2015-11-02 DIAGNOSIS — Z681 Body mass index (BMI) 19 or less, adult: Secondary | ICD-10-CM | POA: Diagnosis not present

## 2015-11-02 DIAGNOSIS — J441 Chronic obstructive pulmonary disease with (acute) exacerbation: Secondary | ICD-10-CM | POA: Diagnosis not present

## 2015-11-02 DIAGNOSIS — Z8639 Personal history of other endocrine, nutritional and metabolic disease: Secondary | ICD-10-CM | POA: Diagnosis not present

## 2015-11-02 DIAGNOSIS — Z8673 Personal history of transient ischemic attack (TIA), and cerebral infarction without residual deficits: Secondary | ICD-10-CM | POA: Diagnosis not present

## 2015-11-02 DIAGNOSIS — J189 Pneumonia, unspecified organism: Secondary | ICD-10-CM | POA: Diagnosis not present

## 2015-11-08 DIAGNOSIS — Z8639 Personal history of other endocrine, nutritional and metabolic disease: Secondary | ICD-10-CM | POA: Diagnosis not present

## 2015-11-08 DIAGNOSIS — Z8673 Personal history of transient ischemic attack (TIA), and cerebral infarction without residual deficits: Secondary | ICD-10-CM | POA: Diagnosis not present

## 2015-11-08 DIAGNOSIS — J441 Chronic obstructive pulmonary disease with (acute) exacerbation: Secondary | ICD-10-CM | POA: Diagnosis not present

## 2015-11-08 DIAGNOSIS — J189 Pneumonia, unspecified organism: Secondary | ICD-10-CM | POA: Diagnosis not present

## 2015-11-08 DIAGNOSIS — I509 Heart failure, unspecified: Secondary | ICD-10-CM | POA: Diagnosis not present

## 2015-11-08 DIAGNOSIS — Z681 Body mass index (BMI) 19 or less, adult: Secondary | ICD-10-CM | POA: Diagnosis not present

## 2015-11-09 DIAGNOSIS — Z8673 Personal history of transient ischemic attack (TIA), and cerebral infarction without residual deficits: Secondary | ICD-10-CM | POA: Diagnosis not present

## 2015-11-09 DIAGNOSIS — J189 Pneumonia, unspecified organism: Secondary | ICD-10-CM | POA: Diagnosis not present

## 2015-11-09 DIAGNOSIS — Z8639 Personal history of other endocrine, nutritional and metabolic disease: Secondary | ICD-10-CM | POA: Diagnosis not present

## 2015-11-09 DIAGNOSIS — J441 Chronic obstructive pulmonary disease with (acute) exacerbation: Secondary | ICD-10-CM | POA: Diagnosis not present

## 2015-11-09 DIAGNOSIS — I509 Heart failure, unspecified: Secondary | ICD-10-CM | POA: Diagnosis not present

## 2015-11-09 DIAGNOSIS — Z681 Body mass index (BMI) 19 or less, adult: Secondary | ICD-10-CM | POA: Diagnosis not present

## 2015-11-12 DIAGNOSIS — Z8673 Personal history of transient ischemic attack (TIA), and cerebral infarction without residual deficits: Secondary | ICD-10-CM | POA: Diagnosis not present

## 2015-11-12 DIAGNOSIS — Z681 Body mass index (BMI) 19 or less, adult: Secondary | ICD-10-CM | POA: Diagnosis not present

## 2015-11-12 DIAGNOSIS — I509 Heart failure, unspecified: Secondary | ICD-10-CM | POA: Diagnosis not present

## 2015-11-12 DIAGNOSIS — J189 Pneumonia, unspecified organism: Secondary | ICD-10-CM | POA: Diagnosis not present

## 2015-11-12 DIAGNOSIS — Z8639 Personal history of other endocrine, nutritional and metabolic disease: Secondary | ICD-10-CM | POA: Diagnosis not present

## 2015-11-12 DIAGNOSIS — J441 Chronic obstructive pulmonary disease with (acute) exacerbation: Secondary | ICD-10-CM | POA: Diagnosis not present

## 2015-11-15 DIAGNOSIS — Z681 Body mass index (BMI) 19 or less, adult: Secondary | ICD-10-CM | POA: Diagnosis not present

## 2015-11-15 DIAGNOSIS — Z8673 Personal history of transient ischemic attack (TIA), and cerebral infarction without residual deficits: Secondary | ICD-10-CM | POA: Diagnosis not present

## 2015-11-15 DIAGNOSIS — J441 Chronic obstructive pulmonary disease with (acute) exacerbation: Secondary | ICD-10-CM | POA: Diagnosis not present

## 2015-11-15 DIAGNOSIS — Z8639 Personal history of other endocrine, nutritional and metabolic disease: Secondary | ICD-10-CM | POA: Diagnosis not present

## 2015-11-15 DIAGNOSIS — J189 Pneumonia, unspecified organism: Secondary | ICD-10-CM | POA: Diagnosis not present

## 2015-11-15 DIAGNOSIS — Z8739 Personal history of other diseases of the musculoskeletal system and connective tissue: Secondary | ICD-10-CM | POA: Diagnosis not present

## 2015-11-15 DIAGNOSIS — I509 Heart failure, unspecified: Secondary | ICD-10-CM | POA: Diagnosis not present

## 2015-11-16 DIAGNOSIS — Z8673 Personal history of transient ischemic attack (TIA), and cerebral infarction without residual deficits: Secondary | ICD-10-CM | POA: Diagnosis not present

## 2015-11-16 DIAGNOSIS — I509 Heart failure, unspecified: Secondary | ICD-10-CM | POA: Diagnosis not present

## 2015-11-16 DIAGNOSIS — Z8639 Personal history of other endocrine, nutritional and metabolic disease: Secondary | ICD-10-CM | POA: Diagnosis not present

## 2015-11-16 DIAGNOSIS — J441 Chronic obstructive pulmonary disease with (acute) exacerbation: Secondary | ICD-10-CM | POA: Diagnosis not present

## 2015-11-16 DIAGNOSIS — J189 Pneumonia, unspecified organism: Secondary | ICD-10-CM | POA: Diagnosis not present

## 2015-11-16 DIAGNOSIS — Z681 Body mass index (BMI) 19 or less, adult: Secondary | ICD-10-CM | POA: Diagnosis not present

## 2015-11-17 DIAGNOSIS — Z681 Body mass index (BMI) 19 or less, adult: Secondary | ICD-10-CM | POA: Diagnosis not present

## 2015-11-17 DIAGNOSIS — Z8639 Personal history of other endocrine, nutritional and metabolic disease: Secondary | ICD-10-CM | POA: Diagnosis not present

## 2015-11-17 DIAGNOSIS — Z8673 Personal history of transient ischemic attack (TIA), and cerebral infarction without residual deficits: Secondary | ICD-10-CM | POA: Diagnosis not present

## 2015-11-17 DIAGNOSIS — J189 Pneumonia, unspecified organism: Secondary | ICD-10-CM | POA: Diagnosis not present

## 2015-11-17 DIAGNOSIS — J441 Chronic obstructive pulmonary disease with (acute) exacerbation: Secondary | ICD-10-CM | POA: Diagnosis not present

## 2015-11-17 DIAGNOSIS — I509 Heart failure, unspecified: Secondary | ICD-10-CM | POA: Diagnosis not present

## 2015-11-23 DIAGNOSIS — I509 Heart failure, unspecified: Secondary | ICD-10-CM | POA: Diagnosis not present

## 2015-11-23 DIAGNOSIS — J441 Chronic obstructive pulmonary disease with (acute) exacerbation: Secondary | ICD-10-CM | POA: Diagnosis not present

## 2015-11-23 DIAGNOSIS — Z8673 Personal history of transient ischemic attack (TIA), and cerebral infarction without residual deficits: Secondary | ICD-10-CM | POA: Diagnosis not present

## 2015-11-23 DIAGNOSIS — Z8639 Personal history of other endocrine, nutritional and metabolic disease: Secondary | ICD-10-CM | POA: Diagnosis not present

## 2015-11-23 DIAGNOSIS — J189 Pneumonia, unspecified organism: Secondary | ICD-10-CM | POA: Diagnosis not present

## 2015-11-23 DIAGNOSIS — Z681 Body mass index (BMI) 19 or less, adult: Secondary | ICD-10-CM | POA: Diagnosis not present

## 2015-11-24 DIAGNOSIS — Z8639 Personal history of other endocrine, nutritional and metabolic disease: Secondary | ICD-10-CM | POA: Diagnosis not present

## 2015-11-24 DIAGNOSIS — Z8673 Personal history of transient ischemic attack (TIA), and cerebral infarction without residual deficits: Secondary | ICD-10-CM | POA: Diagnosis not present

## 2015-11-24 DIAGNOSIS — Z681 Body mass index (BMI) 19 or less, adult: Secondary | ICD-10-CM | POA: Diagnosis not present

## 2015-11-24 DIAGNOSIS — J441 Chronic obstructive pulmonary disease with (acute) exacerbation: Secondary | ICD-10-CM | POA: Diagnosis not present

## 2015-11-24 DIAGNOSIS — I509 Heart failure, unspecified: Secondary | ICD-10-CM | POA: Diagnosis not present

## 2015-11-24 DIAGNOSIS — J189 Pneumonia, unspecified organism: Secondary | ICD-10-CM | POA: Diagnosis not present

## 2015-11-26 DIAGNOSIS — Z8639 Personal history of other endocrine, nutritional and metabolic disease: Secondary | ICD-10-CM | POA: Diagnosis not present

## 2015-11-26 DIAGNOSIS — J189 Pneumonia, unspecified organism: Secondary | ICD-10-CM | POA: Diagnosis not present

## 2015-11-26 DIAGNOSIS — I509 Heart failure, unspecified: Secondary | ICD-10-CM | POA: Diagnosis not present

## 2015-11-26 DIAGNOSIS — Z8673 Personal history of transient ischemic attack (TIA), and cerebral infarction without residual deficits: Secondary | ICD-10-CM | POA: Diagnosis not present

## 2015-11-26 DIAGNOSIS — J441 Chronic obstructive pulmonary disease with (acute) exacerbation: Secondary | ICD-10-CM | POA: Diagnosis not present

## 2015-11-26 DIAGNOSIS — Z681 Body mass index (BMI) 19 or less, adult: Secondary | ICD-10-CM | POA: Diagnosis not present

## 2015-11-30 DIAGNOSIS — J441 Chronic obstructive pulmonary disease with (acute) exacerbation: Secondary | ICD-10-CM | POA: Diagnosis not present

## 2015-11-30 DIAGNOSIS — Z681 Body mass index (BMI) 19 or less, adult: Secondary | ICD-10-CM | POA: Diagnosis not present

## 2015-11-30 DIAGNOSIS — I509 Heart failure, unspecified: Secondary | ICD-10-CM | POA: Diagnosis not present

## 2015-11-30 DIAGNOSIS — J189 Pneumonia, unspecified organism: Secondary | ICD-10-CM | POA: Diagnosis not present

## 2015-11-30 DIAGNOSIS — Z8639 Personal history of other endocrine, nutritional and metabolic disease: Secondary | ICD-10-CM | POA: Diagnosis not present

## 2015-11-30 DIAGNOSIS — Z8673 Personal history of transient ischemic attack (TIA), and cerebral infarction without residual deficits: Secondary | ICD-10-CM | POA: Diagnosis not present

## 2015-12-01 DIAGNOSIS — J441 Chronic obstructive pulmonary disease with (acute) exacerbation: Secondary | ICD-10-CM | POA: Diagnosis not present

## 2015-12-01 DIAGNOSIS — Z8673 Personal history of transient ischemic attack (TIA), and cerebral infarction without residual deficits: Secondary | ICD-10-CM | POA: Diagnosis not present

## 2015-12-01 DIAGNOSIS — I509 Heart failure, unspecified: Secondary | ICD-10-CM | POA: Diagnosis not present

## 2015-12-01 DIAGNOSIS — J189 Pneumonia, unspecified organism: Secondary | ICD-10-CM | POA: Diagnosis not present

## 2015-12-01 DIAGNOSIS — Z8639 Personal history of other endocrine, nutritional and metabolic disease: Secondary | ICD-10-CM | POA: Diagnosis not present

## 2015-12-01 DIAGNOSIS — Z681 Body mass index (BMI) 19 or less, adult: Secondary | ICD-10-CM | POA: Diagnosis not present

## 2015-12-07 DIAGNOSIS — I509 Heart failure, unspecified: Secondary | ICD-10-CM | POA: Diagnosis not present

## 2015-12-07 DIAGNOSIS — Z8639 Personal history of other endocrine, nutritional and metabolic disease: Secondary | ICD-10-CM | POA: Diagnosis not present

## 2015-12-07 DIAGNOSIS — J441 Chronic obstructive pulmonary disease with (acute) exacerbation: Secondary | ICD-10-CM | POA: Diagnosis not present

## 2015-12-07 DIAGNOSIS — J189 Pneumonia, unspecified organism: Secondary | ICD-10-CM | POA: Diagnosis not present

## 2015-12-07 DIAGNOSIS — Z681 Body mass index (BMI) 19 or less, adult: Secondary | ICD-10-CM | POA: Diagnosis not present

## 2015-12-07 DIAGNOSIS — Z8673 Personal history of transient ischemic attack (TIA), and cerebral infarction without residual deficits: Secondary | ICD-10-CM | POA: Diagnosis not present

## 2015-12-08 DIAGNOSIS — Z681 Body mass index (BMI) 19 or less, adult: Secondary | ICD-10-CM | POA: Diagnosis not present

## 2015-12-08 DIAGNOSIS — Z8673 Personal history of transient ischemic attack (TIA), and cerebral infarction without residual deficits: Secondary | ICD-10-CM | POA: Diagnosis not present

## 2015-12-08 DIAGNOSIS — I509 Heart failure, unspecified: Secondary | ICD-10-CM | POA: Diagnosis not present

## 2015-12-08 DIAGNOSIS — J441 Chronic obstructive pulmonary disease with (acute) exacerbation: Secondary | ICD-10-CM | POA: Diagnosis not present

## 2015-12-08 DIAGNOSIS — J189 Pneumonia, unspecified organism: Secondary | ICD-10-CM | POA: Diagnosis not present

## 2015-12-08 DIAGNOSIS — Z8639 Personal history of other endocrine, nutritional and metabolic disease: Secondary | ICD-10-CM | POA: Diagnosis not present

## 2015-12-09 ENCOUNTER — Encounter: Payer: Self-pay | Admitting: Adult Health

## 2015-12-09 ENCOUNTER — Ambulatory Visit (INDEPENDENT_AMBULATORY_CARE_PROVIDER_SITE_OTHER): Payer: PRIVATE HEALTH INSURANCE | Admitting: Adult Health

## 2015-12-09 ENCOUNTER — Ambulatory Visit: Payer: Medicare Other | Admitting: Adult Health

## 2015-12-09 VITALS — BP 140/88 | Temp 97.9°F | Ht 63.0 in | Wt 116.1 lb

## 2015-12-09 DIAGNOSIS — R635 Abnormal weight gain: Secondary | ICD-10-CM | POA: Diagnosis not present

## 2015-12-09 DIAGNOSIS — Z79899 Other long term (current) drug therapy: Secondary | ICD-10-CM | POA: Diagnosis not present

## 2015-12-09 NOTE — Patient Instructions (Signed)
You look great!  As discussed take medications that are marked as needed.   Cut back on remeron to 15 mg  Follow up with me in 3 months or sooner if needed

## 2015-12-09 NOTE — Progress Notes (Signed)
Subjective:    Patient ID: Heidi [name], female    DOB: [birth year], 75 y.o.   MRN: [id number]  HPI  75 year old female who presents to the office today to discuss her weight gain. She is concerned that she is gaining too much weight.   Wt Readings from Last 3 Encounters:  12/09/15 116 lb 1.6 oz (52.663 kg)  08/02/15 97 lb (43.999 kg)  06/02/15 82 lb 11.2 oz (37.512 kg)   She continues to be under hospice care for end stage COPD.   She reports that she feels great, has a wonderful appetitie and is sleeping well.   She is going to moving into an apartment by herself. Her main caregiver [name] will be living next door. Aaliyah is very excited for this.   Review of Systems  Constitutional: Negative.   Respiratory: Negative.   Cardiovascular: Negative.   Neurological: Negative.    Past Medical History  Diagnosis Date  . COPD (chronic obstructive pulmonary disease) ([place])   . Hypothyroidism   . 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 Hyperlipidemia   . Pulmonary nodule   . 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([place])   . Depression   . Cerebrovascular disease   . Positive ANA (antinuclear antibody)   . Diverticulosis   . Esophageal stricture   . Arthritis   . History of pneumonia   . History of stroke     mild oncoming  . Pneumonia     HX of  . Shortness of breath     periodically with COPD flair up  . Internal hemorrhoids   . Rectal ulcer   . Osteoporosis     Social History   Social History  . Marital Status: Widowed    Spouse Name: N/A  . Number of Children: 3  . Years of Education: N/A   Occupational History  . RETIRED    Social History Main Topics  . Smoking status: Former Smoker    Quit date: 11/01/2009  . Smokeless tobacco: Never Used  . Alcohol Use: No  . Drug Use: No  . Sexual Activity: Not Currently   Other Topics Concern  . Not on file   Social History Narrative   Patient has no recent travel. She is smoking 2 packs per day and has  smoked since age of 60. Has cats and dogs at home. No bird or mold exposure.     Past Surgical History  Procedure Laterality Date  . Hiatal hernia repair      x 2  . Cataract extraction      bilateral  . Total abdominal hysterectomy w/ bilateral salpingoophorectomy  1998  . Bladder surgery      tack  . Hemicolectomy  2012    emergent surgery [facility]  . Tonsillectomy    . Colonoscopy  06/22/2011    Procedure: COLONOSCOPY;  Surgeon: Lafayette Dragon, MD;  Location: [facility];  Service: Endoscopy;  Laterality: N/A;  . Gastric varices banding  06/22/2011    Procedure: HEMORRHOID BANDING;  Surgeon: Lafayette Dragon, MD;  Location: [facility] ENDOSCOPY;  Service: Endoscopy;  Laterality: N/A;    Family History  Problem Relation Age of Onset  . Colon cancer Mother   . COPD Father   . Heart attack Father   . Stroke Maternal Aunt   . Colon polyps Brother   . Liver disease Maternal Aunt   . Breast cancer Maternal Aunt   .  Hypertension Son   . Hypothyroidism Daughter     Allergies  Allergen Reactions  . Morphine And Related Nausea And Vomiting  . Penicillins Hives    Has patient had a PCN reaction causing immediate rash, facial/tongue/throat swelling, SOB or lightheadedness with hypotension: No Has patient had a PCN reaction causing severe rash involving mucus membranes or skin necrosis: No Has patient had a PCN reaction that required hospitalization No Has patient had a PCN reaction occurring within the last 10 years: No If all of the above answers are "NO", then may proceed with Cephalosporin use.    Current Outpatient Prescriptions on File Prior to Visit  Medication Sig Dispense Refill  . albuterol (PROVENTIL HFA;VENTOLIN HFA) 108 (90 BASE) MCG/ACT inhaler Inhale 2 puffs into the lungs every 6 (six) hours as needed for wheezing or shortness of breath. 1 Inhaler 0  . albuterol (PROVENTIL) (2.5 MG/3ML) 0.083% nebulizer solution Take 3 mLs (2.5 mg total) by nebulization every 6 (six)  hours as needed for wheezing or shortness of breath. 150 mL 0  . ALPRAZolam (XANAX) 0.5 MG tablet Take 1 tablet (0.5 mg total) by mouth 3 (three) times daily as needed for anxiety or sleep (air hunger. dyspnea). 30 tablet 0  . Fluticasone-Salmeterol (ADVAIR) 100-50 MCG/DOSE AEPB Inhale 1 puff into the lungs 2 (two) times daily. 1 each 3  . furosemide (LASIX) 20 MG tablet Take 1 tablet (20 mg total) by mouth daily as needed for fluid or edema. 30 tablet 0  . gabapentin (NEURONTIN) 300 MG capsule Take 1 capsule (300 mg total) by mouth 2 (two) times daily. 60 capsule 2  . halobetasol (ULTRAVATE) 0.05 % ointment Apply topically 2 (two) times daily.      Marland Kitchen levothyroxine (SYNTHROID, LEVOTHROID) 137 MCG tablet TAKE ONE TABLET BY MOUTH ONCE DAILY BEFORE BREAKFAST. 30 tablet 5  . mirtazapine (REMERON) 15 MG tablet Take 1 tablet (15 mg total) by mouth at bedtime. 30 tablet 5  . morphine (ROXANOL) 20 MG/ML concentrated solution Take 0.5 mLs (10 mg total) by mouth every 2 (two) hours as needed for moderate pain, severe pain, anxiety or shortness of breath. 30 mL 0  . oxyCODONE-acetaminophen (PERCOCET) 5-325 MG tablet Take 1 to 2 tablets by mouth every 6 hours as need for pain 30 tablet 0  . tiotropium (SPIRIVA HANDIHALER) 18 MCG inhalation capsule Place 1 capsule (18 mcg total) into inhaler and inhale daily. 30 capsule 12   No current facility-administered medications on file prior to visit.    BP 140/88 mmHg  Temp(Src) 97.9 F (36.6 C) (Oral)  Ht 5\' 3"  (1.6 m)  Wt 116 lb 1.6 oz (52.663 kg)  BMI 20.57 kg/m2       Objective:   Physical Exam  Constitutional: She is oriented to person, place, and time. She appears well-developed and well-nourished. No distress.  Happy and joyful.   Cardiovascular: Normal rate, regular rhythm, normal heart sounds and intact distal pulses.  Exam reveals no gallop and no friction rub.   No murmur heard. Pulmonary/Chest: Effort normal and breath sounds normal. No  respiratory distress. She has no wheezes. She has no rales. She exhibits no tenderness.  2 L via [place]  Musculoskeletal: Normal range of motion.  Neurological: She is alert and oriented to person, place, and time.  Skin: Skin is warm and dry. No rash noted. She is not diaphoretic. No erythema. No pallor.  Psychiatric: She has a normal mood and affect. Her behavior is normal. Judgment and thought  content normal.  Nursing note and vitals reviewed.     Assessment & Plan:  1. Weight gain Janiyah looks great. I reassured her that her weight is healthy. She is back to where we would like her weight to be.  - Continue to eat healthy and stay active - follow up in three months.   2. Medication management - I am going to have her cut back on Remeron to 15mg  and see how she does there.  - Changed xanax, ativan, naproxen and gabapentin to PRN  Dorothyann Peng, NP

## 2015-12-14 DIAGNOSIS — Z8673 Personal history of transient ischemic attack (TIA), and cerebral infarction without residual deficits: Secondary | ICD-10-CM | POA: Diagnosis not present

## 2015-12-14 DIAGNOSIS — Z8639 Personal history of other endocrine, nutritional and metabolic disease: Secondary | ICD-10-CM | POA: Diagnosis not present

## 2015-12-14 DIAGNOSIS — I509 Heart failure, unspecified: Secondary | ICD-10-CM | POA: Diagnosis not present

## 2015-12-14 DIAGNOSIS — J189 Pneumonia, unspecified organism: Secondary | ICD-10-CM | POA: Diagnosis not present

## 2015-12-14 DIAGNOSIS — J441 Chronic obstructive pulmonary disease with (acute) exacerbation: Secondary | ICD-10-CM | POA: Diagnosis not present

## 2015-12-14 DIAGNOSIS — Z681 Body mass index (BMI) 19 or less, adult: Secondary | ICD-10-CM | POA: Diagnosis not present

## 2015-12-15 DIAGNOSIS — J441 Chronic obstructive pulmonary disease with (acute) exacerbation: Secondary | ICD-10-CM | POA: Diagnosis not present

## 2015-12-15 DIAGNOSIS — Z681 Body mass index (BMI) 19 or less, adult: Secondary | ICD-10-CM | POA: Diagnosis not present

## 2015-12-15 DIAGNOSIS — Z8639 Personal history of other endocrine, nutritional and metabolic disease: Secondary | ICD-10-CM | POA: Diagnosis not present

## 2015-12-15 DIAGNOSIS — I509 Heart failure, unspecified: Secondary | ICD-10-CM | POA: Diagnosis not present

## 2015-12-15 DIAGNOSIS — J189 Pneumonia, unspecified organism: Secondary | ICD-10-CM | POA: Diagnosis not present

## 2015-12-15 DIAGNOSIS — Z8673 Personal history of transient ischemic attack (TIA), and cerebral infarction without residual deficits: Secondary | ICD-10-CM | POA: Diagnosis not present

## 2015-12-16 DIAGNOSIS — J441 Chronic obstructive pulmonary disease with (acute) exacerbation: Secondary | ICD-10-CM | POA: Diagnosis not present

## 2015-12-16 DIAGNOSIS — Z681 Body mass index (BMI) 19 or less, adult: Secondary | ICD-10-CM | POA: Diagnosis not present

## 2015-12-16 DIAGNOSIS — Z8673 Personal history of transient ischemic attack (TIA), and cerebral infarction without residual deficits: Secondary | ICD-10-CM | POA: Diagnosis not present

## 2015-12-16 DIAGNOSIS — Z8739 Personal history of other diseases of the musculoskeletal system and connective tissue: Secondary | ICD-10-CM | POA: Diagnosis not present

## 2015-12-16 DIAGNOSIS — Z8639 Personal history of other endocrine, nutritional and metabolic disease: Secondary | ICD-10-CM | POA: Diagnosis not present

## 2015-12-16 DIAGNOSIS — I509 Heart failure, unspecified: Secondary | ICD-10-CM | POA: Diagnosis not present

## 2015-12-16 DIAGNOSIS — J189 Pneumonia, unspecified organism: Secondary | ICD-10-CM | POA: Diagnosis not present

## 2015-12-17 DIAGNOSIS — Z8639 Personal history of other endocrine, nutritional and metabolic disease: Secondary | ICD-10-CM | POA: Diagnosis not present

## 2015-12-17 DIAGNOSIS — I509 Heart failure, unspecified: Secondary | ICD-10-CM | POA: Diagnosis not present

## 2015-12-17 DIAGNOSIS — Z681 Body mass index (BMI) 19 or less, adult: Secondary | ICD-10-CM | POA: Diagnosis not present

## 2015-12-17 DIAGNOSIS — J189 Pneumonia, unspecified organism: Secondary | ICD-10-CM | POA: Diagnosis not present

## 2015-12-17 DIAGNOSIS — Z8673 Personal history of transient ischemic attack (TIA), and cerebral infarction without residual deficits: Secondary | ICD-10-CM | POA: Diagnosis not present

## 2015-12-17 DIAGNOSIS — J441 Chronic obstructive pulmonary disease with (acute) exacerbation: Secondary | ICD-10-CM | POA: Diagnosis not present

## 2015-12-21 DIAGNOSIS — J441 Chronic obstructive pulmonary disease with (acute) exacerbation: Secondary | ICD-10-CM | POA: Diagnosis not present

## 2015-12-21 DIAGNOSIS — I509 Heart failure, unspecified: Secondary | ICD-10-CM | POA: Diagnosis not present

## 2015-12-21 DIAGNOSIS — Z681 Body mass index (BMI) 19 or less, adult: Secondary | ICD-10-CM | POA: Diagnosis not present

## 2015-12-21 DIAGNOSIS — J189 Pneumonia, unspecified organism: Secondary | ICD-10-CM | POA: Diagnosis not present

## 2015-12-21 DIAGNOSIS — Z8639 Personal history of other endocrine, nutritional and metabolic disease: Secondary | ICD-10-CM | POA: Diagnosis not present

## 2015-12-21 DIAGNOSIS — Z8673 Personal history of transient ischemic attack (TIA), and cerebral infarction without residual deficits: Secondary | ICD-10-CM | POA: Diagnosis not present

## 2015-12-22 DIAGNOSIS — Z8673 Personal history of transient ischemic attack (TIA), and cerebral infarction without residual deficits: Secondary | ICD-10-CM | POA: Diagnosis not present

## 2015-12-22 DIAGNOSIS — Z681 Body mass index (BMI) 19 or less, adult: Secondary | ICD-10-CM | POA: Diagnosis not present

## 2015-12-22 DIAGNOSIS — J189 Pneumonia, unspecified organism: Secondary | ICD-10-CM | POA: Diagnosis not present

## 2015-12-22 DIAGNOSIS — Z8639 Personal history of other endocrine, nutritional and metabolic disease: Secondary | ICD-10-CM | POA: Diagnosis not present

## 2015-12-22 DIAGNOSIS — J441 Chronic obstructive pulmonary disease with (acute) exacerbation: Secondary | ICD-10-CM | POA: Diagnosis not present

## 2015-12-22 DIAGNOSIS — I509 Heart failure, unspecified: Secondary | ICD-10-CM | POA: Diagnosis not present

## 2015-12-28 DIAGNOSIS — Z681 Body mass index (BMI) 19 or less, adult: Secondary | ICD-10-CM | POA: Diagnosis not present

## 2015-12-28 DIAGNOSIS — I509 Heart failure, unspecified: Secondary | ICD-10-CM | POA: Diagnosis not present

## 2015-12-28 DIAGNOSIS — J441 Chronic obstructive pulmonary disease with (acute) exacerbation: Secondary | ICD-10-CM | POA: Diagnosis not present

## 2015-12-28 DIAGNOSIS — J189 Pneumonia, unspecified organism: Secondary | ICD-10-CM | POA: Diagnosis not present

## 2015-12-28 DIAGNOSIS — Z8639 Personal history of other endocrine, nutritional and metabolic disease: Secondary | ICD-10-CM | POA: Diagnosis not present

## 2015-12-28 DIAGNOSIS — Z8673 Personal history of transient ischemic attack (TIA), and cerebral infarction without residual deficits: Secondary | ICD-10-CM | POA: Diagnosis not present

## 2015-12-29 DIAGNOSIS — I509 Heart failure, unspecified: Secondary | ICD-10-CM | POA: Diagnosis not present

## 2015-12-29 DIAGNOSIS — Z8639 Personal history of other endocrine, nutritional and metabolic disease: Secondary | ICD-10-CM | POA: Diagnosis not present

## 2015-12-29 DIAGNOSIS — J441 Chronic obstructive pulmonary disease with (acute) exacerbation: Secondary | ICD-10-CM | POA: Diagnosis not present

## 2015-12-29 DIAGNOSIS — Z8673 Personal history of transient ischemic attack (TIA), and cerebral infarction without residual deficits: Secondary | ICD-10-CM | POA: Diagnosis not present

## 2015-12-29 DIAGNOSIS — J189 Pneumonia, unspecified organism: Secondary | ICD-10-CM | POA: Diagnosis not present

## 2015-12-29 DIAGNOSIS — Z681 Body mass index (BMI) 19 or less, adult: Secondary | ICD-10-CM | POA: Diagnosis not present

## 2016-01-05 DIAGNOSIS — J189 Pneumonia, unspecified organism: Secondary | ICD-10-CM | POA: Diagnosis not present

## 2016-01-05 DIAGNOSIS — Z681 Body mass index (BMI) 19 or less, adult: Secondary | ICD-10-CM | POA: Diagnosis not present

## 2016-01-05 DIAGNOSIS — Z8673 Personal history of transient ischemic attack (TIA), and cerebral infarction without residual deficits: Secondary | ICD-10-CM | POA: Diagnosis not present

## 2016-01-05 DIAGNOSIS — Z8639 Personal history of other endocrine, nutritional and metabolic disease: Secondary | ICD-10-CM | POA: Diagnosis not present

## 2016-01-05 DIAGNOSIS — J441 Chronic obstructive pulmonary disease with (acute) exacerbation: Secondary | ICD-10-CM | POA: Diagnosis not present

## 2016-01-05 DIAGNOSIS — I509 Heart failure, unspecified: Secondary | ICD-10-CM | POA: Diagnosis not present

## 2016-01-10 ENCOUNTER — Ambulatory Visit (INDEPENDENT_AMBULATORY_CARE_PROVIDER_SITE_OTHER): Payer: PRIVATE HEALTH INSURANCE | Admitting: Pulmonary Disease

## 2016-01-10 ENCOUNTER — Ambulatory Visit (INDEPENDENT_AMBULATORY_CARE_PROVIDER_SITE_OTHER)
Admission: RE | Admit: 2016-01-10 | Discharge: 2016-01-10 | Disposition: A | Discharge status: Home / Self Care | Payer: Medicare Other | Source: Ambulatory Visit | Attending: Pulmonary Disease | Admitting: Pulmonary Disease

## 2016-01-10 ENCOUNTER — Encounter: Payer: Self-pay | Admitting: Pulmonary Disease

## 2016-01-10 VITALS — BP 132/72 | HR 73 | Temp 97.9°F | Ht 63.0 in | Wt 112.0 lb

## 2016-01-10 DIAGNOSIS — E039 Hypothyroidism, unspecified: Secondary | ICD-10-CM

## 2016-01-10 DIAGNOSIS — J449 Chronic obstructive pulmonary disease, unspecified: Secondary | ICD-10-CM | POA: Diagnosis not present

## 2016-01-10 DIAGNOSIS — Z681 Body mass index (BMI) 19 or less, adult: Secondary | ICD-10-CM | POA: Diagnosis not present

## 2016-01-10 DIAGNOSIS — M545 Low back pain, unspecified: Secondary | ICD-10-CM

## 2016-01-10 DIAGNOSIS — Z8673 Personal history of transient ischemic attack (TIA), and cerebral infarction without residual deficits: Secondary | ICD-10-CM | POA: Diagnosis not present

## 2016-01-10 DIAGNOSIS — J984 Other disorders of lung: Secondary | ICD-10-CM | POA: Diagnosis not present

## 2016-01-10 DIAGNOSIS — Z8639 Personal history of other endocrine, nutritional and metabolic disease: Secondary | ICD-10-CM | POA: Diagnosis not present

## 2016-01-10 DIAGNOSIS — R0600 Dyspnea, unspecified: Secondary | ICD-10-CM

## 2016-01-10 DIAGNOSIS — M47816 Spondylosis without myelopathy or radiculopathy, lumbar region: Secondary | ICD-10-CM | POA: Diagnosis not present

## 2016-01-10 DIAGNOSIS — Z515 Encounter for palliative care: Secondary | ICD-10-CM

## 2016-01-10 DIAGNOSIS — I5033 Acute on chronic diastolic (congestive) heart failure: Secondary | ICD-10-CM

## 2016-01-10 DIAGNOSIS — J441 Chronic obstructive pulmonary disease with (acute) exacerbation: Secondary | ICD-10-CM | POA: Diagnosis not present

## 2016-01-10 DIAGNOSIS — J9611 Chronic respiratory failure with hypoxia: Secondary | ICD-10-CM | POA: Diagnosis not present

## 2016-01-10 DIAGNOSIS — J189 Pneumonia, unspecified organism: Secondary | ICD-10-CM | POA: Diagnosis not present

## 2016-01-10 DIAGNOSIS — I509 Heart failure, unspecified: Secondary | ICD-10-CM | POA: Diagnosis not present

## 2016-01-10 MED ORDER — ALBUTEROL SULFATE (2.5 MG/3ML) 0.083% IN NEBU: INHALATION_SOLUTION | RESPIRATORY_TRACT | Status: DC

## 2016-01-10 MED ORDER — BUDESONIDE-FORMOTEROL FUMARATE 160-4.5 MCG/ACT IN AERO: 2.0000 | INHALATION_SPRAY | Freq: Two times a day (BID) | RESPIRATORY_TRACT | Status: DC

## 2016-01-10 MED ORDER — UMECLIDINIUM BROMIDE 62.5 MCG/INH IN AEPB: 1.0000 | INHALATION_SPRAY | Freq: Every day | RESPIRATORY_TRACT | Status: DC

## 2016-01-10 NOTE — Progress Notes (Addendum)
Subjective:     Patient ID: Heidi [name], female   DOB: 1941-05-14, 75 y.o.   MRN: [id number]  HPI  ~  May 13, 2015:  Initial pulmonary consult by SN>        Heidi [name] was [facility] 10/3 - 04/27/15 by [facility] after presenting via EMS w/ cough, weakness, increased SOB; she was still smoking, not on home O2; found to have COPD exac & CAP w/ bibasilar opacities, acute on chronic hypoxemic & hypercarbic resp failure, & superimposed CHF w/ bilat L>R pleural effusions (this was tapped 10/9 yielding transudative fluid, neg cytology, neg culture)... She was seen by [facility] attended her in the hosp; Treated w/ Rocephin/ Zithromax, Solumedrol, Nebs, & brief stay in step-down unit on BiPap; disch on ADVAIR100-1spBid, SPIRIVA daily, PRED taper, NEBS w/ Albut... She was also treated for acute on chronic diastolic CHF- mod dil RV & reduced RV sys function (w/ preserved LVF) on 2DEcho & diuresed w/ Lasix but only disch on 25m prn for edema... Hospice was consulted (DrGolding's note reviewed) and she has entered the Hospice program (she is a DNR, DNI) w/ a prognosis est <672mo she was disch on ROXANOL 2046ml--take 1/2 ml Q2H as needed for pain, SOB, anxiety...  She last saw DrSwords 11/2013, she has established new PCP- CorDorothyann PengDrBurchette- has post hosp f/u pending; Note> she cancelled 2 appts w/ DrMcQuaid recently...       Currently Heidi [name] is very stoic- states she's not having any breathing problems at the present time & credits the thoracentesis w/ her remarkable improvement; her last cigarette was 04/19/15 prior to her admission; she was disch on O2 at 4L/min & has an O2sat=89% on this in the office today (RA sat was 74%); she was unable to perform PFT today; we had a frank discussion about her severe, end-stage COPD...       Epic records indicate that she was seen by DrWert in 2011> on Spiriva +/- Advair, stated she quit smoking 2011 on adm for COPD exac, PFT reported FEV1=1.32 (77%), FEV1/FVC  ratio=50%, DLCO=70%; CXR showed COPD & biapical pleuroparenchymal scarring, CT Chest 4/11 showed ?mult pulm nodules ?early cavitation r/o MAI, cultures- only NTF, O2sat=95% on RA...  Medical Hx>   She had an annual wellness exam from her PCP 02/2015> COPD, Hx pulm nodule, Hx of pneumonia; Hypothyroidism; adenomatous colonic polyps; Hyperlipidemia; Sigmoid volvulus; Depression; Cerebrovascular disease; Positive ANA; Diverticulosis; Esophageal stricture; Arthritis; History of stroke; Internal hemorrhoids; and Rectal ulcer; protein-calorie malnutrition (she weighs 78 lbs...   EXAM shows Afeb, VSS, O2sat=89% on O2 at 4L/min; Wt=78#, 5'2"Tall, BMI=16;  Heent- neg, mallampati1;  Chest- decr BS bilat, scat rhonchi, & end-exp wheezing;  Heart- RR gr1/6 SEM w/o r/g;  Abd soft, neg;  Ext- w/o c/c/ tr edema...   Baseline CXR 12/31/14 showed hyperinflation c/w COPD, biapical pleuroparenchymal scarring, nipple shadows and T12 compression fx...  CT Chest 02/22/15 showed cardiomeg, atherosclerotic changes in Ao & coronaries, small right effusion; emhysema w/ several additional findings- RUL & lingular subpleural opacities, & 5mm40mL & RLL nodules; osteopenia and T7 +T12 compression fractures...   CXRs performed during the 04/2015 Admission showed cardiomeg, CHF w/ left effusion, COPD w/ interstitial edema & LLL opac => improved post thoracentesis 10/9 w/ 400cc removed...  ABGs during the 10/16 Hosp showed pCO2 in the 73-87 range w/ resp acidosis...   LABS 10/16 reviewed> CBC- ok w/ Hg=13 range;  Chems- note HCO3=44-45 range & she may benefit drom  Acetazolamide added to her diuretic regimen if more aggressive treatment is desired...   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 She also has a hx of poor compliance w/ med rx & she has once again stopped the prescribed meds perceiving that she is much better  since the thoracentesis; she was referred to Hospice for outpt follow up & will continue to see her PCPs at [facility]; I told her I would be happy to recheck at any time if she so desired...  NOTE> since her disch she states that her breathing has been "so much better" that she hasn't needed her inhalers or the nebulizer, and she's off the Pred; she states she is taking the ASA81, Lasix20, Synthroid137, Xanax, and Remeron; she apparently hasn't needed the Roxanol yet...  We had a frank discussion about her end-stage disease & we "proved it to her" by checking her O2sat off the oxygen (74%), and attempting to get a Spirometry tracing (she couldn't do it);  Pt is asked to get back on her meds & we outlined a regimen>  NEBS w/ Albut Qid (breakfast, lunch, dinner, & bedtime);  ADVAIR100- 2spBid (taken after the Neb at breakfast & dinner);  SPIRIVA via handihaler once daily (taken after the Neb at lunch)...    ~  January 10, 2016:  57moROV w/ SN>  NOTE: family has moved her into her own apt 12/2015) next to her Sister-in-law/ care giver's apt      Heidi [name] for a follow up visit- she has severe COPD/emphysema w/ chronic hypoxemic and hypercarbic resp failure; [facility]; she has been followed by NP-CoryNafziger since then;  She returns today w/ her sister-in-law care giver [name]#[phone] that Hospice is planning to stop services soon but pt is c/o severe LBP x 6wks ever since she lifted a chlorox bottle & felt a "pop" in her back; she has mentioned this to Hospice but all they have done is to adjust her pain meds (Percocet5-325 & Roxanol 2328mml- 0.28m21m2H prn) without further eval;  She states that her breathing is OK- meaning she could get about in her apt before the episode of acute LBP 2wks ago;I note that she has not had any lab work or f/u CXR [facility] & she is clearly in need of same- she has a hx of  compression fx of spine and osteoporosis but not on any bone building therapy... We reviewed the following medical problems during today's office visit >>      Severe COPD> she could not perform simple spirometry when attempted 04/2015; on NEBS w/ Albut Q6H prn but it makes her too jittery, SYMBICORT160-2spBid & [facility];  REC to use Albut 1/2vial in NEB Tid regularly followed by Symbicort160-2spBid & try INCRUSE once daily => we plan ROV in 6wks w/ repeat spirometry & oximetry if she can walk...     Chronic hypoxemic and hypercarbic resp failure> O2sat was 74% on RA 10[place]/ resp acid[place]; she is now on O2 at 2.5L/min regularly & looks much better, chest is clearer and labs improved; we will check ambulatory oximetry on ret if able to walk for us.[place]    Hx pneumonia> resolved- CXR 12/2015 shows COPD & is clear, no  opacity...    Hx bilat pulm nodules on CT Chest> Her CXR is much improved 01/10/16; she will be due for a f/u CT Chest on return 02/2016...    Ex-smoker, quit 04/2015>  congrats on not smoking!    Diastolic CHF w/ pulm edema & transudative left pleural effusion 04/2015> on Lasix20 prn only    Hx cerebrovasc dis & stroke> this must have occurred in [place]- no data in EPIC & no prev CDopplers to review; rec to take ASA81/d & we will check CDopplers later...    MEDICAL Issues>  Hyperlipidemia (not on meds),  Hypothyroidism (on Synthroid137/d),  Prot-cal malnutrition (wt is up 38# & BMI=21 now),      Hx HH repair & esoph stricture> not on PPI rx at present    Hx colon polyps, divertics, hems (w/ banding), & cecal volvulus s/p right hemicolectomy at [facility] 2012> aware...    S/P TAH & BSO> aware    DJD, Back pain w/ compression fxs> she indicates that she "cracked my spine" about 32yr ago, says she was hosp x4d w/ MRI, had brace applied- no shots or vertebroplasty ([facility] in  Epic from [facility]-2016;      Osteoporosis> she had BMD 01/2015 by PCP w/ TScore -4.8 in Lspine & -3.7 in Left FemNeck; placed on Ca++, VitD OTC, and FOSAMAX70/wk started; she picked up #4 tabs 02/11/15 but never refilled the med...    Hx anxiety/ depression> on XANAX 0.589mid prn and REMERON 1546mhs prn...    Hx poor compliance w/ medical regimen> this was before Hospice involvement in her care (04/2015)  EXAM shows Afeb, VSS, O2sat=97% on O2 at 2.5L/min; Wt was 116# in [id number] 38# to BMI=21);  Heent- neg, mallampati1;  Chest- decr BS bilat w/o w/r/r;  Heart- RR gr1/6 SEM w/o r/g;  Abd soft, neg;  Ext- w/o c/c/e in wheelchair & won't stand/walk due to LBP...  CXR 01/10/16>  Much improved- some hyperinflation, bilat apical pleural thickening & bibasilar scarring, no effusion/ edema/ consolidation/ adenopathy, stable ant wedging of T12 w/ kyphosis...   Lumbar spine XRay 01/10/16>  Anterior wedging of L2 is new, old fx of T12, no other fxs & no spondylolithesis- disc sp narrowing L2-3 & L3-4, facet arthritis L5-S1, calcif in abd Ao...  LABS 12/2015>  Chems- wnl, HCO3=32, renal=wnl;  CBC- wnl;  TSH=0.01 on synthroid137;  BNP=70;  Sede=3 IMP/PLAN>>  Respiratory-wise she is improved, CXR is improved, etc but she needs to continue taking O2 and meds regularly- O2 at 2L/min by [place] 24/7, NEBS w/ Albut using 1/2 vial TID every day followed by SYMBICORT160- 2spBid and new INCRUSE one inhalation daily;  XRay of Lspine shows new compression in L2, and Epic review showed that she only took Alendronate70 for 4 wks in Aug2016- she never refilled the Rx;  Discussed CXR & Lumbar films w/ Sister-in-law care giver Heidi [name]=> REC that [place] re-start Calcium/ VitD/ ALENDRONATE70/wk & stay on this; she has a back brace at home & pain meds from Hospice, needs PT for ambulation;  Second issue is her oversuppressed TSH on Synth137=> REC decr dose to 112 & recheck TSH on return... ROV planned in 6wks time.    Past Medical History   Diagnosis Date  . COPD (chronic obstructive pulmonary disease) ([place] . Hypothyroidism   . 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 Hyperlipidemia   .  Pulmonary nodule   . 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([place])   . Depression   . Cerebrovascular disease   . Positive ANA (antinuclear antibody)   . Diverticulosis   . Esophageal stricture   . Arthritis   . History of pneumonia   . History of stroke     mild oncoming  . Pneumonia     HX of  . Shortness of breath     periodically with COPD flair up  . Internal hemorrhoids   . Rectal ulcer   . Osteoporosis     Past Surgical History  Procedure Laterality Date  . Hiatal hernia repair      x 2  . Cataract extraction      bilateral  . Total abdominal hysterectomy w/ bilateral salpingoophorectomy  1998  . Bladder surgery      tack  . Hemicolectomy  2012    [facility]ent [facility]  . Tonsillectomy    . Colonoscopy  06/22/2011    Procedure: COLONOSCOPY;  Surgeon: Lafayette Dragon, MD;  Location: [facility] ENDOSCOPY;  Service: Endoscopy;  Laterality: N/A;  . Gastric varices banding  06/22/2011    Procedure: HEMORRHOID BANDING;  Surgeon: Lafayette Dragon, MD;  Location: [facility] ENDOSCOPY;  Service: Endoscopy;  Laterality: N/A;    Outpatient Encounter Prescriptions as of 01/10/2016  Medication Sig  . albuterol (PROVENTIL HFA;VENTOLIN HFA) 108 (90 BASE) MCG/ACT inhaler Inhale 2 puffs into the lungs every 6 (six) hours as needed for wheezing or shortness of breath.  Marland Kitchen albuterol (PROVENTIL) (2.5 MG/3ML) 0.083% nebulizer solution Use 1/2 vial of ALBUTEROL in NEBULIZER three times daily...  . ALPRAZolam (XANAX) 0.5 MG tablet Take 1 tablet (0.5 mg total) by mouth 3 (three) times daily as needed for anxiety or sleep (air hunger. dyspnea).  . furosemide (LASIX) 20 MG tablet Take 1 tablet (20 mg total) by mouth daily as needed for fluid or edema.  . gabapentin (NEURONTIN) 300 MG capsule Take 1 capsule (300 mg total) by mouth 2 (two) times  daily.  . halobetasol (ULTRAVATE) 0.05 % ointment Apply topically 2 (two) times daily.    Marland Kitchen levothyroxine (SYNTHROID, LEVOTHROID) 137 MCG tablet TAKE ONE TABLET BY MOUTH ONCE DAILY BEFORE BREAKFAST.  . mirtazapine (REMERON) 15 MG tablet Take 1 tablet (15 mg total) by mouth at bedtime.  Marland Kitchen morphine (ROXANOL) 20 MG/ML concentrated solution Take 0.5 mLs (10 mg total) by mouth every 2 (two) hours as needed for moderate pain, severe pain, anxiety or shortness of breath.  . oxyCODONE-acetaminophen (PERCOCET) 5-325 MG tablet Take 1 to 2 tablets by mouth every 6 hours as need for pain  . [DISCONTINUED] albuterol (PROVENTIL) (2.5 MG/3ML) 0.083% nebulizer solution Take 3 mLs (2.5 mg total) by nebulization every 6 (six) hours as needed for wheezing or shortness of breath.  . budesonide-formoterol (SYMBICORT) 160-4.5 MCG/ACT inhaler Inhale 2 puffs into the lungs 2 (two) times daily.  Marland Kitchen umeclidinium bromide (INCRUSE ELLIPTA) 62.5 MCG/INH AEPB Inhale 1 puff into the lungs daily.  . [DISCONTINUED] Fluticasone-Salmeterol (ADVAIR) 100-50 MCG/DOSE AEPB Inhale 1 puff into the lungs 2 (two) times daily. (Patient not taking: Reported on 01/10/2016)  . [DISCONTINUED] tiotropium (SPIRIVA HANDIHALER) 18 MCG inhalation capsule Place 1 capsule (18 mcg total) into inhaler and inhale daily. (Patient not taking: Reported on 01/10/2016)    Allergies  Allergen Reactions  . Morphine And Related Nausea And Vomiting  . Penicillins Hives    Has patient had a PCN reaction causing immediate rash, facial/tongue/throat swelling, SOB or lightheadedness with hypotension:  No Has patient had a PCN reaction causing severe rash involving mucus membranes or skin necrosis: No Has patient had a PCN reaction that required hospitalization No Has patient had a PCN reaction occurring within the last 10 years: No If all of the above answers are "NO", then may proceed with Cephalosporin use.    Immunization History  Administered Date(s)  Administered  . H1N1 06/30/2008  . Influenza Split 04/18/2012  . Influenza Whole 06/10/2007, 04/14/2010, 04/18/2012  . Influenza, High Dose Seasonal PF 06/02/2015  . Influenza,inj,Quad PF,36+ Mos 04/16/2013  . Pneumococcal Conjugate-13 06/02/2015  . Pneumococcal Polysaccharide-23 05/17/2006, 04/16/2013  . Td 03/29/1999  . Tdap 04/16/2013    Current Medications, Allergies, Past Medical History, Past Surgical History, Family History, and Social History were reviewed in [employer] record.   Review of Systems             All symptoms NEG except where BOLDED >>  Constitutional:  F/C/S, fatigue, anorexia, unexpected weight change. HEENT:  HA, visual changes, hearing loss, earache, nasal symptoms, sore throat, mouth sores, hoarseness. Resp:  cough, sputum, hemoptysis; SOB, tightness, wheezing. Cardio:  CP, palpit, DOE, orthopnea, edema. GI:  N/V/D/C, blood in stool; reflux, abd pain, distention, gas. GU:  dysuria, freq, urgency, hematuria, flank pain, voiding difficulty. MS:  joint pain, swelling, tenderness, decr ROM; neck pain, back pain, etc. Neuro:  HA, tremors, seizures, dizziness, syncope, weakness, numbness, gait abn. Skin:  suspicious lesions or skin rash. Heme:  adenopathy, bruising, bleeding. Psyche:  confusion, agitation, sleep disturbance, hallucinations, anxiety, depression suicidal.   Objective:   Physical Exam       Vital Signs:  Reviewed...  General:  WD, thin, 75 y/o WM in NAD; alert & oriented; pleasant & cooperative... HEENT:  [place]/AT; Conjunctiva- pink, Sclera- nonicteric, EOM-wnl, PERRLA, EACs-clear, TMs-wnl; NOSE-clear; THROAT-clear & wnl. Neck:  Supple w/ fair ROM; no JVD; normal carotid impulses w/o bruits; no thyromegaly or nodules palpated; no lymphadenopathy. Chest:  Decr BS bilat, clear w/o w/r/r heard... Heart:  Regular Rhythm; ?S4, gr1/6 SEM Abdomen:  Soft & nontender- no guarding or rebound; normal bowel sounds; no organomegaly or  masses palpated. Ext:  decr ROM; without deformities +arthritic changes; no varicose veins, venous insuffic, no edema;  Pulses intact w/o bruits. Neuro:  No focal neuro deficits- pt in wheelchair & won't stand or walk due to LBP... Derm:  No lesions noted; no rash etc. Lymph:  No cervical, supraclavicular, axillary, or inguinal adenopathy palpated.   Assessment:      IMP>>     Severe COPD> she could not perform simple spirometry when attempted 04/2015; on NEBS w/ Albut Q6H prn but it makes her too jittery, SYMBICORT160-2spBid & [facility] to $$;  REC to use Albut 1/2vial in NEB Tid regularly followed by Symbicort160-2spBid & try INCRUSE once daily => we plan ROV in 6wks w/ repeat spirometry & oximetry if she can walk...     Chronic hypoxemic and hypercarbic resp failure> O2sat was 74% on RA 10/16 and ABGs in [place] showed pCO2=73-87 w/ resp acidosis; she is now on O2 at 2.5L/min regularly & looks much better, chest is clearer and labs improved; we will check ambulatory oximetry on ret if able to walk for [place]...    Hx pneumonia> resolved- CXR 12/2015 shows COPD & is clear, no opacity...    Hx bilat pulm nodules on CT Chest> Her CXR is much improved 01/10/16; she will be due for a f/u CT Chest on  return 02/2016...    Ex-smoker, quit 04/2015>  congrats on not smoking!    Diastolic CHF w/ pulm edema & transudative left pleural effusion 04/2015> on Lasix20 prn only    Hx cerebrovasc dis & stroke> this must have occurred in [place]- no data in EPIC & no prev CDopplers to review; rec to take ASA81/d & we will check CDopplers later...    MEDICAL Issues>  Hyperlipidemia (not on meds),  Hypothyroidism (on Synthroid137- oversuppressed w/ TSH=0.01 therefore decr dose to 127mg/d & recheck),  Prot-cal malnutrition (wt is up 38# & BMI=21 now),      Hx HH repair & esoph stricture> not on PPI rx at present    Hx colon polyps, divertics, hems (w/ banding), & [facility]...    S/P TAH & BSO> aware    DJD, Back pain w/ compression fxs> she indicates that she "cracked my spine" about 473yrago, says she was hosp x4d w/ MRI, had brace applied- no shots or vertebroplasty ([facility] are no [facility]Ray reports in Epic from 2012-2016;      Osteoporosis> she had BMD 01/2015 by PCP w/ TScore -4.8 in Lspine & -3.7 in Left FemNeck; placed on Ca++, VitD OTC, and FOSAMAX70/wk started; she picked up #4 tabs 02/11/15 but never refilled the med...    Hx anxiety/ depression> on XANAX 0.11m711md prn and REMERON 111m12ms prn...    Hx poor compliance w/ medical regimen> this was before Hospice involvement in her care (04/2015)  PLAN>>     Respiratory-wise she is improved, CXR is improved, etc but she needs to continue taking O2 and meds regularly- O2 at 2L/min by [place] 24/7, NEBS w/ Albut using 1/2 vial TID every day followed by SYMBICORT160- 2spBid and new INCRUSE one inhalation daily;  XRay of Lspine shows new compression in L2, and Epic review showed that she only took Alendronate70 for 4 wks in Aug2016- she never refilled the Rx;  Discussed CXR & Lumbar films w/ Sister-in-law care giver Heidi [name]=> REC that Steph[place] re-start Calcium/ VitD/ ALENDRONATE70/wk & stay on this; she has a back brace at home & pain meds from Hospice, needs PT for ambulation;  We decreased her Synthroid to 112mc47mas well... ROV planned in 6wks time.     Plan:     Patient's Medications  New Prescriptions   BUDESONIDE-FORMOTEROL (SYMBICORT) 160-4.5 MCG/ACT INHALER    Inhale 2 puffs into the lungs 2 (two) times daily.   UMECLIDINIUM BROMIDE (INCRUSE ELLIPTA) 62.5 MCG/INH AEPB    Inhale 1 puff into the lungs daily.  Previous Medications   ALBUTEROL (PROVENTIL HFA;VENTOLIN HFA) 108 (90 BASE) MCG/ACT INHALER    Inhale 2 puffs into the lungs every 6 (six) hours as needed for wheezing or shortness of breath.   ALPRAZOLAM (XANAX) 0.5 MG TABLET    Take 1 tablet (0.5 mg  total) by mouth 3 (three) times daily as needed for anxiety or sleep (air hunger. dyspnea).   FUROSEMIDE (LASIX) 20 MG TABLET    Take 1 tablet (20 mg total) by mouth daily as needed for fluid or edema.   GABAPENTIN (NEURONTIN) 300 MG CAPSULE    Take 1 capsule (300 mg total) by mouth 2 (two) times daily.   HALOBETASOL (ULTRAVATE) 0.05 % OINTMENT    Apply topically 2 (two) times daily.     LEVOTHYROXINE (SYNTHROID, LEVOTHROID) 112 MCG TABLET (dose decr from 137=>112)    TAKE  ONE TABLET BY MOUTH ONCE DAILY BEFORE BREAKFAST.   MIRTAZAPINE (REMERON) 15 MG TABLET    Take 1 tablet (15 mg total) by mouth at bedtime.   MORPHINE (ROXANOL) 20 MG/ML CONCENTRATED SOLUTION    Take 0.5 mLs (10 mg total) by mouth every 2 (two) hours as needed for moderate pain, severe pain, anxiety or shortness of breath.   OXYCODONE-ACETAMINOPHEN (PERCOCET) 5-325 MG TABLET    Take 1 to 2 tablets by mouth every 6 hours as need for pain  Modified Medications   Modified Medication Previous Medication   ALBUTEROL (PROVENTIL) (2.5 MG/3ML) 0.083% NEBULIZER SOLUTION albuterol (PROVENTIL) (2.5 MG/3ML) 0.083% nebulizer solution      Use 1/2 vial of ALBUTEROL in NEBULIZER three times daily...    Take 3 mLs (2.5 mg total) by nebulization every 6 (six) hours as needed for wheezing or shortness of breath.  Discontinued Medications   FLUTICASONE-SALMETEROL (ADVAIR) 100-50 MCG/DOSE AEPB    Inhale 1 puff into the lungs 2 (two) times daily.   TIOTROPIUM (SPIRIVA HANDIHALER) 18 MCG INHALATION CAPSULE    Place 1 capsule (18 mcg total) into inhaler and inhale daily.

## 2016-01-10 NOTE — Patient Instructions (Signed)
Today we updated your med list in our EPIC system...    Continue your current medications the same...  We discussed changing the NEB med to 1/2 vial of ALBUTEROL in NEBULIZER three times daily...  Follow this by the SYMBICORT160- 2sprays twice daily and the new INCRUSE one inhalation daily...  Today we checked a follow up CXR, an XRay of your lower back, and BLOOD work...    We will contact you w/ the results when available...   You will also need an OSTEOPOROSIS work up & treatment from your PCP...  Let's plan a follow up visit in 6weeks time.Marland KitchenMarland Kitchen

## 2016-01-11 ENCOUNTER — Other Ambulatory Visit (INDEPENDENT_AMBULATORY_CARE_PROVIDER_SITE_OTHER): Payer: Medicare Other

## 2016-01-11 DIAGNOSIS — Z8639 Personal history of other endocrine, nutritional and metabolic disease: Secondary | ICD-10-CM | POA: Diagnosis not present

## 2016-01-11 DIAGNOSIS — Z681 Body mass index (BMI) 19 or less, adult: Secondary | ICD-10-CM | POA: Diagnosis not present

## 2016-01-11 DIAGNOSIS — J449 Chronic obstructive pulmonary disease, unspecified: Secondary | ICD-10-CM | POA: Diagnosis not present

## 2016-01-11 DIAGNOSIS — I509 Heart failure, unspecified: Secondary | ICD-10-CM | POA: Diagnosis not present

## 2016-01-11 DIAGNOSIS — Z8673 Personal history of transient ischemic attack (TIA), and cerebral infarction without residual deficits: Secondary | ICD-10-CM | POA: Diagnosis not present

## 2016-01-11 DIAGNOSIS — R0600 Dyspnea, unspecified: Secondary | ICD-10-CM | POA: Diagnosis not present

## 2016-01-11 DIAGNOSIS — J189 Pneumonia, unspecified organism: Secondary | ICD-10-CM | POA: Diagnosis not present

## 2016-01-11 DIAGNOSIS — J441 Chronic obstructive pulmonary disease with (acute) exacerbation: Secondary | ICD-10-CM | POA: Diagnosis not present

## 2016-01-11 LAB — CBC WITH DIFFERENTIAL/PLATELET
BASOS PCT: 0.4 % (ref 0.0–3.0)
Basophils Absolute: 0 10*3/uL (ref 0.0–0.1)
EOS PCT: 1.7 % (ref 0.0–5.0)
Eosinophils Absolute: 0.1 10*3/uL (ref 0.0–0.7)
HCT: 41.9 % (ref 36.0–46.0)
Hemoglobin: 13.7 g/dL (ref 12.0–15.0)
LYMPHS ABS: 1.9 10*3/uL (ref 0.7–4.0)
Lymphocytes Relative: 32.7 % (ref 12.0–46.0)
MCHC: 32.7 g/dL (ref 30.0–36.0)
MCV: 87.9 fl (ref 78.0–100.0)
MONOS PCT: 6.8 % (ref 3.0–12.0)
Monocytes Absolute: 0.4 10*3/uL (ref 0.1–1.0)
NEUTROS ABS: 3.4 10*3/uL (ref 1.4–7.7)
NEUTROS PCT: 58.4 % (ref 43.0–77.0)
Platelets: 275 10*3/uL (ref 150.0–400.0)
RBC: 4.77 Mil/uL (ref 3.87–5.11)
RDW: 13.4 % (ref 11.5–15.5)
WBC: 5.8 10*3/uL (ref 4.0–10.5)

## 2016-01-11 LAB — COMPREHENSIVE METABOLIC PANEL
ALK PHOS: 94 U/L (ref 39–117)
ALT: 11 U/L (ref 0–35)
AST: 15 U/L (ref 0–37)
Albumin: 4.2 g/dL (ref 3.5–5.2)
BUN: 11 mg/dL (ref 6–23)
CHLORIDE: 103 meq/L (ref 96–112)
CO2: 32 mEq/L (ref 19–32)
Calcium: 9.6 mg/dL (ref 8.4–10.5)
Creatinine, Ser: 0.57 mg/dL (ref 0.40–1.20)
GFR: 110.1 mL/min (ref >60.00)
GLUCOSE: 82 mg/dL (ref 70–99)
POTASSIUM: 4.7 meq/L (ref 3.5–5.1)
Sodium: 140 mEq/L (ref 135–145)
TOTAL PROTEIN: 7 g/dL (ref 6.0–8.3)
Total Bilirubin: 0.3 mg/dL (ref 0.2–1.2)

## 2016-01-11 LAB — TSH: TSH: 0.01 u[IU]/mL — AB (ref 0.35–4.50)

## 2016-01-11 LAB — BRAIN NATRIURETIC PEPTIDE: PRO B NATRI PEPTIDE: 70 pg/mL (ref 0.0–100.0)

## 2016-01-11 LAB — SEDIMENTATION RATE: Sed Rate: 3 mm/hr (ref 0–30)

## 2016-01-11 MED ORDER — ALENDRONATE SODIUM 70 MG PO TABS: 70.0000 mg | ORAL_TABLET | ORAL | Status: DC

## 2016-01-11 NOTE — Addendum Note (Signed)
Addended by: Parke Poisson E on: 01/11/2016 05:24 PM   Modules accepted: Orders

## 2016-01-13 ENCOUNTER — Other Ambulatory Visit: Payer: Self-pay | Admitting: Pulmonary Disease

## 2016-01-13 DIAGNOSIS — I509 Heart failure, unspecified: Secondary | ICD-10-CM | POA: Diagnosis not present

## 2016-01-13 DIAGNOSIS — Z681 Body mass index (BMI) 19 or less, adult: Secondary | ICD-10-CM | POA: Diagnosis not present

## 2016-01-13 DIAGNOSIS — Z8673 Personal history of transient ischemic attack (TIA), and cerebral infarction without residual deficits: Secondary | ICD-10-CM | POA: Diagnosis not present

## 2016-01-13 DIAGNOSIS — Z8639 Personal history of other endocrine, nutritional and metabolic disease: Secondary | ICD-10-CM | POA: Diagnosis not present

## 2016-01-13 DIAGNOSIS — J441 Chronic obstructive pulmonary disease with (acute) exacerbation: Secondary | ICD-10-CM | POA: Diagnosis not present

## 2016-01-13 DIAGNOSIS — J189 Pneumonia, unspecified organism: Secondary | ICD-10-CM | POA: Diagnosis not present

## 2016-01-13 MED ORDER — LEVOTHYROXINE SODIUM 112 MCG PO TABS: ORAL_TABLET | ORAL | Status: DC

## 2016-01-13 NOTE — Progress Notes (Signed)
Rx telephoned to pharmacist [place] list updated

## 2016-01-13 NOTE — Progress Notes (Signed)
Quick Note:  Called spoke with patient, advised of lab results / recs as stated by SN. Pt verbalized her understanding and denied any questions. Orders only encounter created for Levothyroxine dose change. Rx was telephoned to verified pharmacy, spoke with pharmacist Santiago Glad. ______

## 2016-01-14 ENCOUNTER — Telehealth: Payer: Self-pay | Admitting: Pulmonary Disease

## 2016-01-14 ENCOUNTER — Telehealth: Payer: Self-pay | Admitting: *Deleted

## 2016-01-14 MED ORDER — MOMETASONE FURO-FORMOTEROL FUM 200-5 MCG/ACT IN AERO: 2.0000 | INHALATION_SPRAY | Freq: Two times a day (BID) | RESPIRATORY_TRACT | Status: DC

## 2016-01-14 NOTE — Telephone Encounter (Signed)
Per verbal order from SN Send dulera 200  Take 2 puffs twice a day  Called spoke with pt's sister. Reviewed SN's recs and verified pharmacy as [facility] in [place]. She voiced understanding and had no further questions.

## 2016-01-14 NOTE — Telephone Encounter (Signed)
PA initiated on patient for incruse Key:  [id number] This is under review. Will await decision. Form placed in PA triage box.    PA fax completed for symbicort and faxed to [phone]. Will await decision. Form placed back in triage.

## 2016-01-14 NOTE — Telephone Encounter (Signed)
Called spoke with Heidi [name]. She states that the PA was approved from today until 01/13/17. Nothing further needed.   Called spoke pt. Informed her that the PA was approved. She voiced understanding and had no further questions.

## 2016-01-14 NOTE — Telephone Encounter (Signed)
Last ov with SN on 01/10/16 Patient Instructions       Today we updated your med list in our EPIC system...    Continue your current medications the same...  We discussed changing the NEB med to 1/2 vial of ALBUTEROL in NEBULIZER three times daily...  Follow this by the SYMBICORT160- 2sprays twice daily and the new INCRUSE one inhalation daily...  Today we checked a follow up CXR, an XRay of your lower back, and BLOOD work...    We will contact you w/ the results when available...   You will also need an OSTEOPOROSIS work up & treatment from your PCP...  Let's plan a follow up visit in 6weeks time...       Called and spoke with Patty. She states that the Symbicort PA was denied because the pt has not tried the alternative. Alternatives are Breo and Dulera. She states that the pt must have tried and failed both of these alternatives before it will be approved. I explained to her that I would send the message to SN for his recs. She voiced understanding and had no further questions.  Allergies  Allergen Reactions  . Morphine And Related Nausea And Vomiting  . Penicillins Hives    Has patient had a PCN reaction causing immediate rash, facial/tongue/throat swelling, SOB or lightheadedness with hypotension: No Has patient had a PCN reaction causing severe rash involving mucus membranes or skin necrosis: No Has patient had a PCN reaction that required hospitalization No Has patient had a PCN reaction occurring within the last 10 years: No If all of the above answers are "NO", then may proceed with Cephalosporin use.    Current outpatient prescriptions:  .  albuterol (PROVENTIL HFA;VENTOLIN HFA) 108 (90 BASE) MCG/ACT inhaler, Inhale 2 puffs into the lungs every 6 (six) hours as needed for wheezing or shortness of breath., Disp: 1 Inhaler, Rfl: 0 .  albuterol (PROVENTIL) (2.5 MG/3ML) 0.083% nebulizer solution, Use 1/2 vial of ALBUTEROL in NEBULIZER three times daily..., Disp: 150 mL, Rfl:  0 .  alendronate (FOSAMAX) 70 MG tablet, Take 1 tablet (70 mg total) by mouth once a week. Take with a full glass of water on an empty stomach first thing in the morning., Disp: 12 tablet, Rfl: 3 .  ALPRAZolam (XANAX) 0.5 MG tablet, Take 1 tablet (0.5 mg total) by mouth 3 (three) times daily as needed for anxiety or sleep (air hunger. dyspnea)., Disp: 30 tablet, Rfl: 0 .  budesonide-formoterol (SYMBICORT) 160-4.5 MCG/ACT inhaler, Inhale 2 puffs into the lungs 2 (two) times daily., Disp: 1 Inhaler, Rfl: 6 .  furosemide (LASIX) 20 MG tablet, Take 1 tablet (20 mg total) by mouth daily as needed for fluid or edema., Disp: 30 tablet, Rfl: 0 .  gabapentin (NEURONTIN) 300 MG capsule, Take 1 capsule (300 mg total) by mouth 2 (two) times daily., Disp: 60 capsule, Rfl: 2 .  halobetasol (ULTRAVATE) 0.05 % ointment, Apply topically 2 (two) times daily.  , Disp: , Rfl:  .  levothyroxine (SYNTHROID, LEVOTHROID) 112 MCG tablet, TAKE ONE TABLET BY MOUTH ONCE DAILY BEFORE BREAKFAST., Disp: 30 tablet, Rfl: 5 .  mirtazapine (REMERON) 15 MG tablet, Take 1 tablet (15 mg total) by mouth at bedtime., Disp: 30 tablet, Rfl: 5 .  morphine (ROXANOL) 20 MG/ML concentrated solution, Take 0.5 mLs (10 mg total) by mouth every 2 (two) hours as needed for moderate pain, severe pain, anxiety or shortness of breath., Disp: 30 mL, Rfl: 0 .  oxyCODONE-acetaminophen (PERCOCET) 5-325  MG tablet, Take 1 to 2 tablets by mouth every 6 hours as need for pain, Disp: 30 tablet, Rfl: 0 .  umeclidinium bromide (INCRUSE ELLIPTA) 62.5 MCG/INH AEPB, Inhale 1 puff into the lungs daily., Disp: 1 each, Rfl: 6   SN please advise

## 2016-01-14 NOTE — Telephone Encounter (Signed)
Baneka called from [facility] regarding PA. They can be reached at [phone] Reference # [id number]

## 2016-01-15 DIAGNOSIS — J189 Pneumonia, unspecified organism: Secondary | ICD-10-CM | POA: Diagnosis not present

## 2016-01-15 DIAGNOSIS — I509 Heart failure, unspecified: Secondary | ICD-10-CM | POA: Diagnosis not present

## 2016-01-15 DIAGNOSIS — Z681 Body mass index (BMI) 19 or less, adult: Secondary | ICD-10-CM | POA: Diagnosis not present

## 2016-01-15 DIAGNOSIS — Z8739 Personal history of other diseases of the musculoskeletal system and connective tissue: Secondary | ICD-10-CM | POA: Diagnosis not present

## 2016-01-15 DIAGNOSIS — Z8639 Personal history of other endocrine, nutritional and metabolic disease: Secondary | ICD-10-CM | POA: Diagnosis not present

## 2016-01-15 DIAGNOSIS — Z8673 Personal history of transient ischemic attack (TIA), and cerebral infarction without residual deficits: Secondary | ICD-10-CM | POA: Diagnosis not present

## 2016-01-15 DIAGNOSIS — J441 Chronic obstructive pulmonary disease with (acute) exacerbation: Secondary | ICD-10-CM | POA: Diagnosis not present

## 2016-01-17 ENCOUNTER — Telehealth: Payer: Self-pay | Admitting: Pulmonary Disease

## 2016-01-17 DIAGNOSIS — M545 Low back pain, unspecified: Secondary | ICD-10-CM

## 2016-01-17 DIAGNOSIS — Z515 Encounter for palliative care: Secondary | ICD-10-CM

## 2016-01-17 DIAGNOSIS — J438 Other emphysema: Secondary | ICD-10-CM

## 2016-01-17 NOTE — Telephone Encounter (Signed)
Referral placed 01/11/16 for Hospice Note    Pt is currently on Hospice, but may be discharged soon d/t improvement. SN recommends for pt to have Home PT for ambulation >> 6/26 L-Spine shows wedging of the L2 vertebral body and pt has been c/o severe low back pain. Thanks.     LM for Melissa [facility] x 1

## 2016-01-17 NOTE — Telephone Encounter (Signed)
Order has been corrected in [facility] and staff message sent to [facility] with [place] via [facility].

## 2016-01-17 NOTE — Telephone Encounter (Signed)
lmtcb X1 for Melissa.  

## 2016-01-19 DIAGNOSIS — Z8639 Personal history of other endocrine, nutritional and metabolic disease: Secondary | ICD-10-CM | POA: Diagnosis not present

## 2016-01-19 DIAGNOSIS — I509 Heart failure, unspecified: Secondary | ICD-10-CM | POA: Diagnosis not present

## 2016-01-19 DIAGNOSIS — J441 Chronic obstructive pulmonary disease with (acute) exacerbation: Secondary | ICD-10-CM | POA: Diagnosis not present

## 2016-01-19 DIAGNOSIS — Z681 Body mass index (BMI) 19 or less, adult: Secondary | ICD-10-CM | POA: Diagnosis not present

## 2016-01-19 DIAGNOSIS — J189 Pneumonia, unspecified organism: Secondary | ICD-10-CM | POA: Diagnosis not present

## 2016-01-19 DIAGNOSIS — Z8673 Personal history of transient ischemic attack (TIA), and cerebral infarction without residual deficits: Secondary | ICD-10-CM | POA: Diagnosis not present

## 2016-01-19 NOTE — Telephone Encounter (Signed)
Per verbal order from [facility] Send PT referral to Hospice  Order has been placed. Nothing further needed at this time.

## 2016-01-19 NOTE — Telephone Encounter (Signed)
Spoke with Melissa @ [facility]. She states that per pt's family pt is still under Hospice care. This creates an overlap and [facility] cannot provide PT while pt is under Hospice.   SN Please advise. Thanks!

## 2016-01-19 NOTE — Telephone Encounter (Addendum)
Attempted to contact [place]. No answer, no option to leave a message. Will try back.

## 2016-01-20 DIAGNOSIS — Z8639 Personal history of other endocrine, nutritional and metabolic disease: Secondary | ICD-10-CM | POA: Diagnosis not present

## 2016-01-20 DIAGNOSIS — Z8673 Personal history of transient ischemic attack (TIA), and cerebral infarction without residual deficits: Secondary | ICD-10-CM | POA: Diagnosis not present

## 2016-01-20 DIAGNOSIS — I509 Heart failure, unspecified: Secondary | ICD-10-CM | POA: Diagnosis not present

## 2016-01-20 DIAGNOSIS — Z681 Body mass index (BMI) 19 or less, adult: Secondary | ICD-10-CM | POA: Diagnosis not present

## 2016-01-20 DIAGNOSIS — J441 Chronic obstructive pulmonary disease with (acute) exacerbation: Secondary | ICD-10-CM | POA: Diagnosis not present

## 2016-01-20 DIAGNOSIS — J189 Pneumonia, unspecified organism: Secondary | ICD-10-CM | POA: Diagnosis not present

## 2016-01-21 DIAGNOSIS — Z681 Body mass index (BMI) 19 or less, adult: Secondary | ICD-10-CM | POA: Diagnosis not present

## 2016-01-21 DIAGNOSIS — Z8673 Personal history of transient ischemic attack (TIA), and cerebral infarction without residual deficits: Secondary | ICD-10-CM | POA: Diagnosis not present

## 2016-01-21 DIAGNOSIS — Z8639 Personal history of other endocrine, nutritional and metabolic disease: Secondary | ICD-10-CM | POA: Diagnosis not present

## 2016-01-21 DIAGNOSIS — I509 Heart failure, unspecified: Secondary | ICD-10-CM | POA: Diagnosis not present

## 2016-01-21 DIAGNOSIS — J189 Pneumonia, unspecified organism: Secondary | ICD-10-CM | POA: Diagnosis not present

## 2016-01-21 DIAGNOSIS — J441 Chronic obstructive pulmonary disease with (acute) exacerbation: Secondary | ICD-10-CM | POA: Diagnosis not present

## 2016-01-25 DIAGNOSIS — I509 Heart failure, unspecified: Secondary | ICD-10-CM | POA: Diagnosis not present

## 2016-01-25 DIAGNOSIS — Z8639 Personal history of other endocrine, nutritional and metabolic disease: Secondary | ICD-10-CM | POA: Diagnosis not present

## 2016-01-25 DIAGNOSIS — J189 Pneumonia, unspecified organism: Secondary | ICD-10-CM | POA: Diagnosis not present

## 2016-01-25 DIAGNOSIS — Z681 Body mass index (BMI) 19 or less, adult: Secondary | ICD-10-CM | POA: Diagnosis not present

## 2016-01-25 DIAGNOSIS — J441 Chronic obstructive pulmonary disease with (acute) exacerbation: Secondary | ICD-10-CM | POA: Diagnosis not present

## 2016-01-25 DIAGNOSIS — Z8673 Personal history of transient ischemic attack (TIA), and cerebral infarction without residual deficits: Secondary | ICD-10-CM | POA: Diagnosis not present

## 2016-01-26 DIAGNOSIS — J189 Pneumonia, unspecified organism: Secondary | ICD-10-CM | POA: Diagnosis not present

## 2016-01-26 DIAGNOSIS — Z8639 Personal history of other endocrine, nutritional and metabolic disease: Secondary | ICD-10-CM | POA: Diagnosis not present

## 2016-01-26 DIAGNOSIS — Z681 Body mass index (BMI) 19 or less, adult: Secondary | ICD-10-CM | POA: Diagnosis not present

## 2016-01-26 DIAGNOSIS — Z8673 Personal history of transient ischemic attack (TIA), and cerebral infarction without residual deficits: Secondary | ICD-10-CM | POA: Diagnosis not present

## 2016-01-26 DIAGNOSIS — J441 Chronic obstructive pulmonary disease with (acute) exacerbation: Secondary | ICD-10-CM | POA: Diagnosis not present

## 2016-01-26 DIAGNOSIS — I509 Heart failure, unspecified: Secondary | ICD-10-CM | POA: Diagnosis not present

## 2016-01-28 ENCOUNTER — Telehealth: Payer: Self-pay

## 2016-01-28 DIAGNOSIS — Z8673 Personal history of transient ischemic attack (TIA), and cerebral infarction without residual deficits: Secondary | ICD-10-CM | POA: Diagnosis not present

## 2016-01-28 DIAGNOSIS — Z8639 Personal history of other endocrine, nutritional and metabolic disease: Secondary | ICD-10-CM | POA: Diagnosis not present

## 2016-01-28 DIAGNOSIS — J441 Chronic obstructive pulmonary disease with (acute) exacerbation: Secondary | ICD-10-CM | POA: Diagnosis not present

## 2016-01-28 DIAGNOSIS — Z681 Body mass index (BMI) 19 or less, adult: Secondary | ICD-10-CM | POA: Diagnosis not present

## 2016-01-28 DIAGNOSIS — J189 Pneumonia, unspecified organism: Secondary | ICD-10-CM | POA: Diagnosis not present

## 2016-01-28 DIAGNOSIS — I509 Heart failure, unspecified: Secondary | ICD-10-CM | POA: Diagnosis not present

## 2016-01-28 NOTE — Telephone Encounter (Signed)
That's fine

## 2016-02-01 DIAGNOSIS — I509 Heart failure, unspecified: Secondary | ICD-10-CM | POA: Diagnosis not present

## 2016-02-01 DIAGNOSIS — J441 Chronic obstructive pulmonary disease with (acute) exacerbation: Secondary | ICD-10-CM | POA: Diagnosis not present

## 2016-02-01 DIAGNOSIS — J189 Pneumonia, unspecified organism: Secondary | ICD-10-CM | POA: Diagnosis not present

## 2016-02-01 DIAGNOSIS — Z681 Body mass index (BMI) 19 or less, adult: Secondary | ICD-10-CM | POA: Diagnosis not present

## 2016-02-01 DIAGNOSIS — Z8673 Personal history of transient ischemic attack (TIA), and cerebral infarction without residual deficits: Secondary | ICD-10-CM | POA: Diagnosis not present

## 2016-02-01 DIAGNOSIS — Z8639 Personal history of other endocrine, nutritional and metabolic disease: Secondary | ICD-10-CM | POA: Diagnosis not present

## 2016-02-01 NOTE — Telephone Encounter (Signed)
Patient states she will have sister in law call back and schedule follow up for her since she will be the one having to bring her. Thanks.

## 2016-02-02 DIAGNOSIS — J441 Chronic obstructive pulmonary disease with (acute) exacerbation: Secondary | ICD-10-CM | POA: Diagnosis not present

## 2016-02-02 DIAGNOSIS — Z8673 Personal history of transient ischemic attack (TIA), and cerebral infarction without residual deficits: Secondary | ICD-10-CM | POA: Diagnosis not present

## 2016-02-02 DIAGNOSIS — I509 Heart failure, unspecified: Secondary | ICD-10-CM | POA: Diagnosis not present

## 2016-02-02 DIAGNOSIS — J189 Pneumonia, unspecified organism: Secondary | ICD-10-CM | POA: Diagnosis not present

## 2016-02-02 DIAGNOSIS — Z681 Body mass index (BMI) 19 or less, adult: Secondary | ICD-10-CM | POA: Diagnosis not present

## 2016-02-02 DIAGNOSIS — Z8639 Personal history of other endocrine, nutritional and metabolic disease: Secondary | ICD-10-CM | POA: Diagnosis not present

## 2016-02-07 DIAGNOSIS — J189 Pneumonia, unspecified organism: Secondary | ICD-10-CM | POA: Diagnosis not present

## 2016-02-07 DIAGNOSIS — J441 Chronic obstructive pulmonary disease with (acute) exacerbation: Secondary | ICD-10-CM | POA: Diagnosis not present

## 2016-02-07 DIAGNOSIS — Z681 Body mass index (BMI) 19 or less, adult: Secondary | ICD-10-CM | POA: Diagnosis not present

## 2016-02-07 DIAGNOSIS — Z8639 Personal history of other endocrine, nutritional and metabolic disease: Secondary | ICD-10-CM | POA: Diagnosis not present

## 2016-02-07 DIAGNOSIS — Z8673 Personal history of transient ischemic attack (TIA), and cerebral infarction without residual deficits: Secondary | ICD-10-CM | POA: Diagnosis not present

## 2016-02-07 DIAGNOSIS — I509 Heart failure, unspecified: Secondary | ICD-10-CM | POA: Diagnosis not present

## 2016-02-08 DIAGNOSIS — Z8673 Personal history of transient ischemic attack (TIA), and cerebral infarction without residual deficits: Secondary | ICD-10-CM | POA: Diagnosis not present

## 2016-02-08 DIAGNOSIS — J441 Chronic obstructive pulmonary disease with (acute) exacerbation: Secondary | ICD-10-CM | POA: Diagnosis not present

## 2016-02-08 DIAGNOSIS — I509 Heart failure, unspecified: Secondary | ICD-10-CM | POA: Diagnosis not present

## 2016-02-08 DIAGNOSIS — Z8639 Personal history of other endocrine, nutritional and metabolic disease: Secondary | ICD-10-CM | POA: Diagnosis not present

## 2016-02-08 DIAGNOSIS — Z681 Body mass index (BMI) 19 or less, adult: Secondary | ICD-10-CM | POA: Diagnosis not present

## 2016-02-08 DIAGNOSIS — J189 Pneumonia, unspecified organism: Secondary | ICD-10-CM | POA: Diagnosis not present

## 2016-02-10 ENCOUNTER — Encounter: Payer: Self-pay | Admitting: Adult Health

## 2016-02-10 ENCOUNTER — Ambulatory Visit (INDEPENDENT_AMBULATORY_CARE_PROVIDER_SITE_OTHER): Payer: PRIVATE HEALTH INSURANCE | Admitting: Adult Health

## 2016-02-10 DIAGNOSIS — M545 Low back pain, unspecified: Secondary | ICD-10-CM

## 2016-02-10 DIAGNOSIS — I509 Heart failure, unspecified: Secondary | ICD-10-CM | POA: Diagnosis not present

## 2016-02-10 DIAGNOSIS — Z681 Body mass index (BMI) 19 or less, adult: Secondary | ICD-10-CM | POA: Diagnosis not present

## 2016-02-10 DIAGNOSIS — G4700 Insomnia, unspecified: Secondary | ICD-10-CM | POA: Diagnosis not present

## 2016-02-10 DIAGNOSIS — F329 Major depressive disorder, single episode, unspecified: Secondary | ICD-10-CM | POA: Diagnosis not present

## 2016-02-10 DIAGNOSIS — F32A Depression, unspecified: Secondary | ICD-10-CM

## 2016-02-10 DIAGNOSIS — J189 Pneumonia, unspecified organism: Secondary | ICD-10-CM | POA: Diagnosis not present

## 2016-02-10 DIAGNOSIS — J441 Chronic obstructive pulmonary disease with (acute) exacerbation: Secondary | ICD-10-CM | POA: Diagnosis not present

## 2016-02-10 DIAGNOSIS — R634 Abnormal weight loss: Secondary | ICD-10-CM | POA: Diagnosis not present

## 2016-02-10 DIAGNOSIS — Z8639 Personal history of other endocrine, nutritional and metabolic disease: Secondary | ICD-10-CM | POA: Diagnosis not present

## 2016-02-10 DIAGNOSIS — Z8673 Personal history of transient ischemic attack (TIA), and cerebral infarction without residual deficits: Secondary | ICD-10-CM | POA: Diagnosis not present

## 2016-02-10 MED ORDER — MIRTAZAPINE 30 MG PO TABS: 30.0000 mg | ORAL_TABLET | Freq: Every day | ORAL | 3 refills | Status: DC

## 2016-02-10 MED ORDER — METHYLPREDNISOLONE 4 MG PO TBPK: ORAL_TABLET | ORAL | 0 refills | Status: DC

## 2016-02-10 NOTE — Patient Instructions (Signed)
It was great seeing you today!  I have sent in a prescription for a Medrol Dose pack. This is for your back pain. You can also apply a Salonpas patch or other muscle rub  I have increased your Remeron from 15 mg to 30 mg to help with sleep. Let me know if this does not help

## 2016-02-10 NOTE — Progress Notes (Signed)
Subjective:    Patient ID: Heidi [name], female    DOB: [birth year], 75 y.o.   MRN: [id number]  HPI  75 year old female who presents today for follow up regarding depression. Her PHQ 9 on 12/15/2014 was 18. She was placed on Remeron around that time for depression, weight loss and poor appetite. During her visits between then and now she had reported improvement in all aspects of her life. Today her PHQ 9 score is 0.   She does have an acute complaint of lower back pain. This happened about 2 weeks ago when she was cleaning the bath tub and " lifted the bottle of bleach wrong." She endorse having lower back pain ever since. The pain is a aching pain with sitting but then feels as though the muscle is stretching when she reaches for something. Hospice placed orders for x rays, which they report were " negative."   IMPRESSION: Age uncertain anterior wedge compression fracture of the L2 vertebral body. This fracture was not present on prior CT performed approximately 10 months prior. There is a stable prior fracture of the T12 vertebral body. No spondylolisthesis. Areas of osteoarthritic change.  There is a sclerotic focus in the superior medial left iliac crest. Suspect bone island, although this focus was not present on prior abdominal radiograph from 2009.  She continues to have pain in her lower back.   Additionally, she feels as though she is not sleeping well anymore. She is waking up around 2-3 on many mornings and is not able to go back to sleep.   Depression screen [facility] 2/9 02/10/2016 02/17/2015 12/15/2014 12/15/2014 12/15/2014  Decreased Interest 0 0 3 - 3  Down, Depressed, Hopeless 0 0 3 - 3  PHQ - 2 Score 0 0 6 - 6  Altered sleeping - - 3 - 2  Tired, decreased energy - - 2 - -  Change in appetite - - 3 - 3  Feeling bad or failure about yourself  - - 2 2 -  Trouble concentrating - - 2 1 -  Moving slowly or fidgety/restless - - 0 2 -  Suicidal thoughts - - 0 0 -  PHQ-9 Score -  - 18 - 11     Review of Systems  Constitutional: Positive for activity change.  HENT: Negative.   Respiratory: Negative.   Cardiovascular: Negative.   Musculoskeletal: Positive for back pain, gait problem and myalgias.  Skin: Negative.   All other systems reviewed and are negative.      Objective:   Physical Exam  Constitutional: She is oriented to person, place, and time. She appears well-developed and well-nourished. No distress.  Cardiovascular: Normal rate, regular rhythm, normal heart sounds and intact distal pulses.  Exam reveals no gallop and no friction rub.   No murmur heard. Pulmonary/Chest: Effort normal and breath sounds normal. No respiratory distress. She has no wheezes. She has no rales. She exhibits no tenderness.  Musculoskeletal: Normal range of motion. She exhibits tenderness.  There is tenderness and slight swelling across lower back ( T12-L1). Appears to be soft tissue swelling.   Neurological: She is alert and oriented to person, place, and time.  Skin: Skin is warm and dry. No rash noted. She is not diaphoretic. No erythema. No pallor.  Psychiatric: She has a normal mood and affect. Her behavior is normal. Judgment and thought content normal.  Nursing note and vitals reviewed.     Assessment & Plan:  1. Depression - Much  improved over the last year.  - mirtazapine (REMERON) 30 MG tablet; Take 1 tablet (30 mg total) by mouth at bedtime.  Dispense: 90 tablet; Refill: 3 - Continue to monitor.  2. Insomnia - Will increase Remeron from 15 mg to 30 mg  - mirtazapine (REMERON) 30 MG tablet; Take 1 tablet (30 mg total) by mouth at bedtime.  Dispense: 90 tablet; Refill: 3 - Follow up if no improvement   3. Bilateral low back pain without sciatica - methylPREDNISolone (MEDROL DOSEPAK) 4 MG TBPK tablet; Take as directed  Dispense: 21 tablet; Refill: 0 - Salonpas or other muscle rubs.  - Follow up if no improvement.   Dorothyann Peng, NP

## 2016-02-14 DIAGNOSIS — J441 Chronic obstructive pulmonary disease with (acute) exacerbation: Secondary | ICD-10-CM | POA: Diagnosis not present

## 2016-02-14 DIAGNOSIS — Z681 Body mass index (BMI) 19 or less, adult: Secondary | ICD-10-CM | POA: Diagnosis not present

## 2016-02-14 DIAGNOSIS — J189 Pneumonia, unspecified organism: Secondary | ICD-10-CM | POA: Diagnosis not present

## 2016-02-14 DIAGNOSIS — Z8673 Personal history of transient ischemic attack (TIA), and cerebral infarction without residual deficits: Secondary | ICD-10-CM | POA: Diagnosis not present

## 2016-02-14 DIAGNOSIS — Z8639 Personal history of other endocrine, nutritional and metabolic disease: Secondary | ICD-10-CM | POA: Diagnosis not present

## 2016-02-14 DIAGNOSIS — I509 Heart failure, unspecified: Secondary | ICD-10-CM | POA: Diagnosis not present

## 2016-02-15 DIAGNOSIS — J441 Chronic obstructive pulmonary disease with (acute) exacerbation: Secondary | ICD-10-CM | POA: Diagnosis not present

## 2016-02-15 DIAGNOSIS — J189 Pneumonia, unspecified organism: Secondary | ICD-10-CM | POA: Diagnosis not present

## 2016-02-15 DIAGNOSIS — Z8673 Personal history of transient ischemic attack (TIA), and cerebral infarction without residual deficits: Secondary | ICD-10-CM | POA: Diagnosis not present

## 2016-02-15 DIAGNOSIS — Z8639 Personal history of other endocrine, nutritional and metabolic disease: Secondary | ICD-10-CM | POA: Diagnosis not present

## 2016-02-15 DIAGNOSIS — I509 Heart failure, unspecified: Secondary | ICD-10-CM | POA: Diagnosis not present

## 2016-02-15 DIAGNOSIS — Z8739 Personal history of other diseases of the musculoskeletal system and connective tissue: Secondary | ICD-10-CM | POA: Diagnosis not present

## 2016-02-15 DIAGNOSIS — Z681 Body mass index (BMI) 19 or less, adult: Secondary | ICD-10-CM | POA: Diagnosis not present

## 2016-02-16 DIAGNOSIS — I509 Heart failure, unspecified: Secondary | ICD-10-CM | POA: Diagnosis not present

## 2016-02-16 DIAGNOSIS — Z8639 Personal history of other endocrine, nutritional and metabolic disease: Secondary | ICD-10-CM | POA: Diagnosis not present

## 2016-02-16 DIAGNOSIS — Z681 Body mass index (BMI) 19 or less, adult: Secondary | ICD-10-CM | POA: Diagnosis not present

## 2016-02-16 DIAGNOSIS — J189 Pneumonia, unspecified organism: Secondary | ICD-10-CM | POA: Diagnosis not present

## 2016-02-16 DIAGNOSIS — Z8673 Personal history of transient ischemic attack (TIA), and cerebral infarction without residual deficits: Secondary | ICD-10-CM | POA: Diagnosis not present

## 2016-02-16 DIAGNOSIS — J441 Chronic obstructive pulmonary disease with (acute) exacerbation: Secondary | ICD-10-CM | POA: Diagnosis not present

## 2016-02-22 DIAGNOSIS — I509 Heart failure, unspecified: Secondary | ICD-10-CM | POA: Diagnosis not present

## 2016-02-22 DIAGNOSIS — Z8673 Personal history of transient ischemic attack (TIA), and cerebral infarction without residual deficits: Secondary | ICD-10-CM | POA: Diagnosis not present

## 2016-02-22 DIAGNOSIS — J441 Chronic obstructive pulmonary disease with (acute) exacerbation: Secondary | ICD-10-CM | POA: Diagnosis not present

## 2016-02-22 DIAGNOSIS — Z8639 Personal history of other endocrine, nutritional and metabolic disease: Secondary | ICD-10-CM | POA: Diagnosis not present

## 2016-02-22 DIAGNOSIS — Z681 Body mass index (BMI) 19 or less, adult: Secondary | ICD-10-CM | POA: Diagnosis not present

## 2016-02-22 DIAGNOSIS — J189 Pneumonia, unspecified organism: Secondary | ICD-10-CM | POA: Diagnosis not present

## 2016-02-23 ENCOUNTER — Ambulatory Visit (INDEPENDENT_AMBULATORY_CARE_PROVIDER_SITE_OTHER): Payer: PRIVATE HEALTH INSURANCE | Admitting: Pulmonary Disease

## 2016-02-23 ENCOUNTER — Other Ambulatory Visit (INDEPENDENT_AMBULATORY_CARE_PROVIDER_SITE_OTHER): Payer: Medicare Other

## 2016-02-23 ENCOUNTER — Encounter: Payer: Self-pay | Admitting: Pulmonary Disease

## 2016-02-23 VITALS — BP 122/74 | HR 78 | Temp 97.9°F | Ht 63.0 in | Wt 114.0 lb

## 2016-02-23 DIAGNOSIS — J9611 Chronic respiratory failure with hypoxia: Secondary | ICD-10-CM | POA: Diagnosis not present

## 2016-02-23 DIAGNOSIS — I5032 Chronic diastolic (congestive) heart failure: Secondary | ICD-10-CM | POA: Diagnosis not present

## 2016-02-23 DIAGNOSIS — M545 Low back pain, unspecified: Secondary | ICD-10-CM | POA: Insufficient documentation

## 2016-02-23 DIAGNOSIS — J449 Chronic obstructive pulmonary disease, unspecified: Secondary | ICD-10-CM

## 2016-02-23 DIAGNOSIS — E039 Hypothyroidism, unspecified: Secondary | ICD-10-CM | POA: Diagnosis not present

## 2016-02-23 HISTORY — DX: Chronic diastolic (congestive) heart failure: I50.32

## 2016-02-23 LAB — TSH: TSH: 0.02 u[IU]/mL — ABNORMAL LOW (ref 0.35–4.50)

## 2016-02-23 NOTE — Patient Instructions (Addendum)
Today we updated your med list in our EPIC system...    Continue your current medications the same...  SYMBICORT160- 2 sprays TWICEdaily... INCRUSE- one inhalation daily... NEBULIZER w/ ALBUTEROL up to 3 ties daily as needed...  Your current dose of synthroid is 112 mcg/d...    We will recheck your thyroid blood test on this dose to see if any further adjustment is needed...    We will ask DrCory & DrBurchette to follow up on this medication going forward...  Stay as active as poss & congrats on your new apartment...  Let's plan a follow up visit in 3 months, sooner if needed for breathing problems.Marland KitchenMarland Kitchen

## 2016-02-23 NOTE — Progress Notes (Signed)
Subjective:     Patient ID: Heidi [name], female   DOB: 07/21/1940, 75 y.o.   MRN: [id number]  HPI 75 y/o WF former smoker who quit 2016 w/ severe COPD/emphysema & chronic hypoxemic and hypercarbic resp failure, placed on Hospice by DrSwords/ DrBurchette in 2016, hx pneumonia and small bilat pulm nodules on CT Chest, and diastolic CHF...   ~  May 13, 2015:  Initial pulmonary consult by SN>        Heidi [name] was Hosp 10/3 - 04/27/15 by [facility] after presenting via EMS w/ cough, weakness, increased SOB; she was still smoking, not on home O2; found to have COPD exac & CAP w/ bibasilar opacities, acute on chronic hypoxemic & hypercarbic resp failure, & superimposed CHF w/ bilat L>R pleural effusions (this was tapped 10/9 yielding transudative fluid, neg cytology, neg culture)... She was seen by CCM- both MR & JN attended her in the hosp; Treated w/ Rocephin/ Zithromax, Solumedrol, Nebs, & brief stay in step-down unit on BiPap; disch on ADVAIR100-1spBid, SPIRIVA daily, PRED taper, NEBS w/ Albut... She was also treated for acute on chronic diastolic CHF- mod dil RV & reduced RV sys function (w/ preserved LVF) on 2DEcho & diuresed w/ Lasix but only disch on 20mg prn for edema... Hospice was consulted (DrGolding's note reviewed) and she has entered the Hospice program (she is a DNR, DNI) w/ a prognosis est <6mo & she was disch on ROXANOL 20mg/ml--take 1/2 ml Q2H as needed for pain, SOB, anxiety...  She last saw DrSwords 11/2013, she has established new PCP- [name] & DrBurchette- has post hosp f/u pending; Note> she cancelled 2 appts w/ DrMcQuaid recently...       Currently Heidi [name] is very stoic- states she's not having any breathing problems at the present time & credits the thoracentesis w/ her remarkable improvement; her last cigarette was 04/19/15 prior to her admission; she was disch on O2 at 4L/min & has an O2sat=89% on this in the office today (RA sat was 74%); she was unable to perform PFT today; we had  a frank discussion about her severe, end-stage COPD...       Epic records indicate that she was seen by DrWert in 2011> on Spiriva +/- Advair, stated she quit smoking 2011 on adm for COPD exac, PFT reported FEV1=1.32 (77%), FEV1/FVC ratio=50%, DLCO=70%; CXR showed COPD & biapical pleuroparenchymal scarring, CT Chest 4/11 showed ?mult pulm nodules ?early cavitation r/o MAI, cultures- only NTF, O2sat=95% on RA...  Medical Hx>   She had an annual wellness exam from her PCP 02/2015> COPD, Hx pulm nodule, Hx of pneumonia; Hypothyroidism; adenomatous colonic polyps; Hyperlipidemia; Sigmoid volvulus; Depression; Cerebrovascular disease; Positive ANA; Diverticulosis; Esophageal stricture; Arthritis; History of stroke; Internal hemorrhoids; and Rectal ulcer; protein-calorie malnutrition (she weighs 78 lbs...  EXAM shows Afeb, VSS, O2sat=89% on O2 at 4L/min; Wt=78#, 5'2"Tall, BMI=16;  Heent- neg, mallampati1;  Chest- decr BS bilat, scat rhonchi, & end-exp wheezing;  Heart- RR gr1/6 SEM w/o r/g;  Abd soft, neg;  Ext- w/o c/c/ tr edema...   Baseline CXR 12/31/14 showed hyperinflation c/w COPD, biapical pleuroparenchymal scarring, nipple shadows and T12 compression fx...  CT Chest 02/22/15 showed cardiomeg, atherosclerotic changes in Ao & coronaries, small right effusion; emhysema w/ several additional findings- RUL & lingular subpleural opacities, & 5mm LUL & RLL nodules; osteopenia and T7 +T12 compression fractures...   CXRs performed during the 04/2015 Admission showed cardiomeg, CHF w/ left effusion, COPD w/ interstitial edema & LLL opac => improved post thoracentesis   10/9 w/ 400cc removed...  ABGs during the 10/16 Hosp showed pCO2 in the 73-87 range w/ resp acidosis...   LABS 10/16 reviewed> CBC- ok w/ Hg=13 range;  Chems- note HCO3=44-45 range & she may benefit drom Acetazolamide added to her diuretic regimen if more aggressive treatment is desired...  IMP>> Heidi [name] has severe COPD w/ acute on chronic hypoxemic &  hypercarbic resp failure & cor pulmonale; her last cigarette was the day of admission 04/19/15... She also has a hx of poor compliance w/ med rx & she has once again stopped the prescribed meds perceiving that she is much better since the thoracentesis; she was referred to Hospice for outpt follow up & will continue to see her PCPs at [facility]; I told her I would be happy to recheck at any time if she so desired...  NOTE> since her disch she states that her breathing has been "so much better" that she hasn't needed her inhalers or the nebulizer, and she's off the Pred; she states she is taking the ASA81, Lasix20, Synthroid137, Xanax, and Remeron; she apparently hasn't needed the Roxanol yet...  We had a frank discussion about her end-stage disease & we "proved it to her" by checking her O2sat off the oxygen (74%), and attempting to get a Spirometry tracing (she couldn't do it);  Pt is asked to get back on her meds & we outlined a regimen>  NEBS w/ Albut Qid (breakfast, lunch, dinner, & bedtime);  ADVAIR100- 2spBid (taken after the Neb at breakfast & dinner);  SPIRIVA via handihaler once daily (taken after the Neb at lunch)...    ~  January 10, 2016:  60moROV w/ SN>  NOTE: family has moved her into her own apt 12/2015) next to her Sister-in-law/ care giver's apt      Heidi [name] for a follow up visit- she has severe COPD/emphysema w/ chronic hypoxemic and hypercarbic resp failure; [facility]; she has been followed by NP-CoryNafziger since then;  She returns today w/ her sister-in-law care giver [name]#[phone] that Hospice is planning to stop services soon but pt is c/o severe LBP x 6wks ever since she lifted a chlorox bottle & felt a "pop" in her back; she has mentioned this to Hospice but all they have done is to adjust her pain meds (Percocet5-325 & Roxanol 283mml- 0.71m70m2H prn) without further eval;  She states that her breathing  is OK- meaning she could get about in her apt before the episode of acute LBP 2wks ago;I note that she has not had any lab work or f/u CXR since [facility] & she is clearly in need of same- she has a hx of compression fx of spine and osteoporosis but not on any bone building therapy... We reviewed the following medical problems during today's office visit >>     Severe COPD> she could not perform simple spirometry when attempted 04/2015; on NEBS w/ Albut Q6H prn but it makes her too jittery, SYMBICORT160-2spBid & [facility];  REC to use Albut 1/2vial in NEB Tid regularly followed by Symbicort160-2spBid & try INCRUSE once daily => we plan ROV in 6wks w/ repeat spirometry & oximetry if she can walk...     Chronic hypoxemic and hypercarbic resp failure> O2sat was 74% on RA 10[place]16 [place]87 w/ resp acidosis; she is now on O2 at 2.5L/min regularly &  looks much better, chest is clearer and labs improved; we will check ambulatory oximetry on ret if able to walk for [place]...    Hx pneumonia> resolved- CXR 12/2015 shows COPD & is clear, no opacity...    Hx bilat pulm nodules on CT Chest> Her CXR is much improved 01/10/16; she will be due for a f/u CT Chest on return 02/2016...    Ex-smoker, quit 04/2015>  congrats on not smoking!    Diastolic CHF w/ pulm edema & transudative left pleural effusion 04/2015> on Lasix20 prn only    Hx cerebrovasc dis & stroke> this must have occurred in [place]- no data in EPIC & no prev CDopplers to review; rec to take ASA81/d & we will check CDopplers later...    MEDICAL Issues>  Hyperlipidemia (not on meds),  Hypothyroidism (on Synthroid137/d),  Prot-cal malnutrition (wt is up 38# & BMI=21 now),      Hx HH repair & esoph stricture> not on PPI rx at present    Hx colon polyps, divertics, hems (w/ banding), & cecal volvulus s/p right hemicolectomy at [facility] 2012> aware...    S/P TAH & BSO> aware    DJD, Back pain w/  compression fxs> she indicates that she "cracked my spine" about 67yr ago, says she was hosp x4d w/ MRI, had brace applied- no shots or vertebroplasty ([facility] in Epic from [facility]-2016;      Osteoporosis> she had BMD 01/2015 by PCP w/ TScore -4.8 in Lspine & -3.7 in Left FemNeck; placed on Ca++, VitD OTC, and FOSAMAX70/wk started; she picked up #4 tabs 02/11/15 but never refilled the med...    Hx anxiety/ depression> on XANAX 0.571mid prn and REMERON 1559mhs prn...    Hx poor compliance w/ medical regimen> this was before Hospice involvement in her care (04/2015) EXAM shows Afeb, VSS, O2sat=97% on O2 at 2.5L/min; Wt was 116# in [id number] 38# to BMI=21);  Heent- neg, mallampati1;  Chest- decr BS bilat w/o w/r/r;  Heart- RR gr1/6 SEM w/o r/g;  Abd soft, neg;  Ext- w/o c/c/e in wheelchair & won't stand/walk due to LBP...  CXR 01/10/16>  Much improved- some hyperinflation, bilat apical pleural thickening & bibasilar scarring, no effusion/ edema/ consolidation/ adenopathy, stable ant wedging of T12 w/ kyphosis...   Lumbar spine XRay 01/10/16>  Anterior wedging of L2 is new, old fx of T12, no other fxs & no spondylolithesis- disc sp narrowing L2-3 & L3-4, facet arthritis L5-S1, calcif in abd Ao...  LABS 12/2015>  Chems- wnl, HCO3=32, renal=wnl;  CBC- wnl;  TSH=0.01 on synthroid137;  BNP=70;  Sede=3 IMP/PLAN>>  Respiratory-wise she is improved, CXR is improved, etc but she needs to continue taking O2 and meds regularly- O2 at 2L/min by [place] 24/7, NEBS w/ Albut using 1/2 vial TID every day followed by SYMBICORT160- 2spBid and new INCRUSE one inhalation daily;  XRay of Lspine shows new compression in L2, and Epic review showed that she only took Alendronate70 for 4 wks in Aug2016- she never refilled the Rx;  Discussed CXR & Lumbar films w/ Sister-in-law care giver Nancy=> REC that Heidi [name] re-start Calcium/ VitD/ ALENDRONATE70/wk & stay on this; she has a back  brace at home & pain meds from Hospice, needs PT for ambulation;  Second issue is her oversuppressed TSH on Synth137=> REC decr dose to 112 & recheck TSH on return... ROV planned in 6wks time.  ~  February 23, 2016:  6wk ROV & f/u w/ SN>  Heidi [name] has done remarkably well from the pulm standpoint- stabilized and improved suffic to be able to move into her own appt & care for herself w/ family support nearby; she says she feels like she's getting better too; she was working w/ PT at home but she thinks they were working her too hard & she sent them away- I explained that this therapy is the MOST IMPORTANT thing for her continued recovery!  She tells me that this is the last month of her yr on Hospice, she has Roxanol on her med list but thank goodness she hasn't needed it!  She is in a wheelchair in the office today- but tells me she is up & about at home=> we will walk her today & check O2sats...    1) Severe COPD> she could not perform simple spirometry when attempted 04/2015; on NEBS w/ Albut Q6H prn & using 1/2 vial 2-3x per wk, SYMBICORT160-2spBid & Incruse once daily; Hx medication noncompliance & not using the NEBS Tid as requested!     2) Chronic hypoxemic and hypercarbic resp failure> O2sat was 74% on RA 10/16 and ABGs in Hosp showed pCO2=73-87 w/ resp acidosis; she is now on O2 at 2.5L/min regularly & looks much better, chest is clearer and labs improved; ambulatory oximetry 02/2016 showed no desaturation on 2.5L/min...     3) Hx pneumonia> resolved- CXR 12/2015 shows COPD & clear, no opacity...    4) Hx bilat pulm nodules on CT Chest> Her CXR is much improved 01/10/16; she will be due for a f/u CT Chest to check these nodules soon...    5) Ex-smoker, quit 04/2015>  congrats on not smoking!    6) Diastolic CHF w/ pulm edema & transudative left pleural effusion 04/2015> on Lasix20 prn only now    7) Hx cerebrovasc dis & stroke> this must have occurred in [place]- no data in EPIC & no prev CDopplers to  review; rec to take ASA81/d & she will need CDopplers from her PCP on ret...    8) MEDICAL Issues>  Hyperlipidemia (not on meds),  Hypothyroidism (on Synthroid112/d),  Prot-cal malnutrition (wt is up to 114# & BMI=20 now); I reviewed all her TSH readings and called her Pharm ([facility] in [place]) & they confirmed poor med compliance in past- eery time her dose was increased she was NOT taking her meds regularly!  She is now on 112mcg/d and pill counts are accurate- TSH=0.02 so this dose is still too high for her; WE WILL DECR DOSE TO 75mcg/d and turn over follow up of this problem to her PCP- DrCory & DrBurchette...    9) Hx HH repair & esoph stricture> not on PPI rx at present    10) Hx colon polyps, divertics, hems (w/ banding), & cecal volvulus s/p right hemicolectomy at [place] 2012> aware...    11) S/P TAH & BSO> aware    12) DJD, Back pain w/ compression fxs> she indicates that she "cracked my spine" about 4yrs ago, says she was hosp x4d w/ MRI, had brace applied- no shots or vertebroplasty (this must have been done at [facility] as there are no XRay reports in Epic from 2012-2016; she was taking Roxanol for pain but will be leaving hospice soon & her PCP will assume care of her osteoporosis, back pain=> pain meds and bone building therapy.    13) Osteoporosis> she had BMD 01/2015 by PCP w/ TScore -4.8 in Lspine & -3.7 in   Left FemNeck; placed on Ca++, VitD OTC, and FOSAMAX70/wk started; she picked up #4 tabs 02/11/15 but never refilled the med...    14) Hx anxiety/ depression> on XANAX 0.'5mg'$ Tid prn and REMERON '15mg'$  Qhs prn...    15) Hx poor compliance w/ medical regimen> this was before Hospice involvement in her care (04/2015) EXAM shows Afeb, VSS, O2sat=97% on O2 at 1.5L/min; Wt = 114#, BMI=21;  Heent- neg, mallampati1;  Chest- decr BS bilat w/o w/r/r;  Heart- RR gr1/6 SEM w/o r/g;  Abd soft, neg;  Ext- w/o c/c/e in wheelchair today...  Ambulatory oximetry 02/23/16>  O2sat=94% on 1.5L/min at rest  w/ pulse=82/min;  She ambulated 3 Laps pushing her wheelchair w/ lowest O2sat=91% w/ HR=110/min...  LABS 02/23/16 showed TSH=0.02 on Levothy 112mg/d taken regularly ([facility] IMP/PLAN>>  Heidi [name] stabilized from the cardiopulm standpoint & she is instructed to continue her current meds regularly Symbicort & Incruse) plus her NEBS, VentHFA, & Lasix20 as needed;  Looks like she needs CDoppler f/u, f/u FLP, management of her thyroid medication to insure compliance w/ Rx, and management of her osteoporosis/ bone building therapy & her back pain due to compression fxs => note sent to her PCP...    Past Medical History:  Diagnosis Date  . Arthritis   . Cerebrovascular disease   . Chronic diastolic CHF (congestive heart failure) ([facility] 02/23/2016  . COPD (chronic obstructive pulmonary disease) ([facility]   . Depression   . Diverticulosis   . Esophageal stricture   . History of pneumonia   . History of stroke    mild oncoming  . 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 Hyperlipidemia   . Hypothyroidism   . Internal hemorrhoids   . Osteoporosis   . Pneumonia    HX of  . Positive ANA (antinuclear antibody)   . Pulmonary nodule   . Rectal ulcer   . Shortness of breath    periodically with COPD flair up  . 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([facility]     Past Surgical History:  Procedure Laterality Date  . BLADDER SURGERY     tack  . CATARACT EXTRACTION     bilateral  . COLONOSCOPY  06/22/2011   Procedure: COLONOSCOPY;  Surgeon: DLafayette Dragon MD;  Location: [facility] ENDOSCOPY;  Service: Endoscopy;  Laterality: N/A;  . GASTRIC VARICES BANDING  06/22/2011   Procedure: HEMORRHOID BANDING;  Surgeon: DLafayette Dragon MD;  Location: [facility] ENDOSCOPY;  Service: Endoscopy;  Laterality: N/A;  . HEMICOLECTOMY  2012   emergent surgery [place] [facility]  . HIATAL HERNIA REPAIR     x 2  . TONSILLECTOMY    . TOTAL ABDOMINAL HYSTERECTOMY W/ BILATERAL SALPINGOOPHORECTOMY  1998     Outpatient Encounter Prescriptions as of 01/10/2016  Medication Sig  . albuterol (PROVENTIL HFA;VENTOLIN HFA) 108 (90 BASE) MCG/ACT inhaler Inhale 2 puffs into the lungs every 6 (six) hours as needed for wheezing or shortness of breath.  .Marland Kitchenalbuterol (PROVENTIL) (2.5 MG/3ML) 0.083% nebulizer solution Use 1/2 vial of ALBUTEROL in NEBULIZER three times daily...  . ALPRAZolam (XANAX) 0.5 MG tablet Take 1 tablet (0.5 mg total) by mouth 3 (three) times daily as needed for anxiety or sleep (air hunger. dyspnea).  . furosemide (LASIX) 20 MG tablet Take 1 tablet (20 mg total) by mouth daily as needed for fluid or edema.  . gabapentin (NEURONTIN) 300 MG capsule Take 1 capsule (300 mg total) by mouth 2 (two) times daily.  .Marland Kitchen  halobetasol (ULTRAVATE) 0.05 % ointment Apply topically 2 (two) times daily.    Marland Kitchen levothyroxine (SYNTHROID, LEVOTHROID) 112 MCG tablet TAKE ONE TABLET BY MOUTH ONCE DAILY BEFORE BREAKFAST.  . mirtazapine (REMERON) 15 MG tablet Take 1 tablet (15 mg total) by mouth at bedtime.  Marland Kitchen morphine (ROXANOL) 20 MG/ML concentrated solution Take 0.5 mLs (10 mg total) by mouth every 2 (two) hours as needed for moderate pain, severe pain, anxiety or shortness of breath.  . oxyCODONE-acetaminophen (PERCOCET) 5-325 MG tablet Take 1 to 2 tablets by mouth every 6 hours as need for pain  . [DISCONTINUED] albuterol (PROVENTIL) (2.5 MG/3ML) 0.083% nebulizer solution Take 3 mLs (2.5 mg total) by nebulization every 6 (six) hours as needed for wheezing or shortness of breath.  . budesonide-formoterol (SYMBICORT) 160-4.5 MCG/ACT inhaler Inhale 2 puffs into the lungs 2 (two) times daily.  Marland Kitchen umeclidinium bromide (INCRUSE ELLIPTA) 62.5 MCG/INH AEPB Inhale 1 puff into the lungs daily.  . [DISCONTINUED] Fluticasone-Salmeterol (ADVAIR) 100-50 MCG/DOSE AEPB Inhale 1 puff into the lungs 2 (two) times daily. (Patient not taking: Reported on 01/10/2016)  . [DISCONTINUED] tiotropium (SPIRIVA HANDIHALER) 18 MCG inhalation  capsule Place 1 capsule (18 mcg total) into inhaler and inhale daily. (Patient not taking: Reported on 01/10/2016)    Allergies  Allergen Reactions  . Morphine And Related Nausea And Vomiting  . Penicillins Hives    Has patient had a PCN reaction causing immediate rash, facial/tongue/throat swelling, SOB or lightheadedness with hypotension: No Has patient had a PCN reaction causing severe rash involving mucus membranes or skin necrosis: No Has patient had a PCN reaction that required hospitalization No Has patient had a PCN reaction occurring within the last 10 years: No If all of the above answers are "NO", then may proceed with Cephalosporin use.    Immunization History  Administered Date(s) Administered  . H1N1 06/30/2008  . Influenza Split 04/18/2012  . Influenza Whole 06/10/2007, 04/14/2010, 04/18/2012  . Influenza, High Dose Seasonal PF 06/02/2015  . Influenza,inj,Quad PF,36+ Mos 04/16/2013  . Pneumococcal Conjugate-13 06/02/2015  . Pneumococcal Polysaccharide-23 05/17/2006, 04/16/2013  . Td 03/29/1999  . Tdap 04/16/2013    Current Medications, Allergies, Past Medical History, Past Surgical History, Family History, and Social History were reviewed in [employer] record.   Review of Systems             All symptoms NEG except where BOLDED >>  Constitutional:  F/C/S, fatigue, anorexia, unexpected weight change. HEENT:  HA, visual changes, hearing loss, earache, nasal symptoms, sore throat, mouth sores, hoarseness. Resp:  cough, sputum, hemoptysis; SOB, tightness, wheezing. Cardio:  CP, palpit, DOE, orthopnea, edema. GI:  N/V/D/C, blood in stool; reflux, abd pain, distention, gas. GU:  dysuria, freq, urgency, hematuria, flank pain, voiding difficulty. MS:  joint pain, swelling, tenderness, decr ROM; neck pain, back pain, etc. Neuro:  HA, tremors, seizures, dizziness, syncope, weakness, numbness, gait abn. Skin:  suspicious lesions or skin rash. Heme:   adenopathy, bruising, bleeding. Psyche:  confusion, agitation, sleep disturbance, hallucinations, anxiety, depression suicidal.   Objective:   Physical Exam       Vital Signs:  Reviewed...   General:  WD, thin, 75 y/o WM in NAD; alert & oriented; pleasant & cooperative... HEENT:  [place]/AT; Conjunctiva- pink, Sclera- nonicteric, EOM-wnl, PERRLA, EACs-clear, TMs-wnl; NOSE-clear; THROAT-clear & wnl.  Neck:  Supple w/ fair ROM; no JVD; normal carotid impulses w/o bruits; no thyromegaly or nodules palpated; no lymphadenopathy.  Chest:  Decr BS bilat, clear w/o w/r/r  heard... Heart:  Regular Rhythm; ?S4, gr1/6 SEM Abdomen:  Soft & nontender- no guarding or rebound; normal bowel sounds; no organomegaly or masses palpated. Ext:  decr ROM; without deformities +arthritic changes; no varicose veins, venous insuffic, no edema;  Pulses intact w/o bruits. Neuro:  No focal neuro deficits- pt in wheelchair & won't stand or walk due to LBP... Derm:  No lesions noted; no rash etc. Lymph:  No cervical, supraclavicular, axillary, or inguinal adenopathy palpated.   Assessment:      IMP>>     Severe COPD> she could not perform simple spirometry when attempted 04/2015; on NEBS w/ Albut Q6H prn but it makes her too jittery, SYMBICORT160-2spBid & not using the [facility] due to $$;  REC to use Albut 1/2vial in NEB Tid regularly followed by Symbicort160-2spBid & try INCRUSE once daily => we plan ROV in 6wks w/ repeat spirometry & oximetry if she can walk...     Chronic hypoxemic and hypercarbic resp failure> O2sat was 74% on RA 10/16 and ABGs in [place] showed pCO2=73-87 w/ resp acidosis; she is now on O2 at 2.5L/min regularly & looks much better, chest is clearer and labs improved; we will check ambulatory oximetry on ret if able to walk for [place]...    Hx pneumonia> resolved- CXR 12/2015 shows COPD & is clear, no opacity...    Hx bilat pulm nodules on CT Chest> Her CXR is much improved 01/10/16; she will be due for a f/u CT  Chest on return 02/2016...    Ex-smoker, quit 04/2015>  congrats on not smoking!    Diastolic CHF w/ pulm edema & transudative left pleural effusion 04/2015> on Lasix20 prn only    Hx cerebrovasc dis & stroke> this must have occurred in [place]- no data in EPIC & no prev CDopplers to review; rec to take ASA81/d & we will check CDopplers later...    MEDICAL Issues>  Hyperlipidemia (not on meds),  Hypothyroidism (on Synthroid137- oversuppressed w/ TSH=0.01 therefore decr dose to 135mg/d & recheck),  Prot-cal malnutrition (wt is up 38# & BMI=21 now),      Hx HH repair & esoph stricture> not on PPI rx at present    Hx colon polyps, divertics, hems (w/ banding), & [facility] aware...    S/P TAH & BSO> aware    DJD, Back pain w/ compression fxs> she indicates that she "cracked my spine" about [place], says she was hosp x4d w/ MRI, had brace applied- no shots or vertebroplasty ([facility] are no [facility] reports in Epic from 2012-2016;      Osteoporosis> she had BMD 01/2015 by PCP w/ TScore -4.8 in Lspine & -3.7 in Left FemNeck; placed on Ca++, VitD OTC, and FOSAMAX70/wk started; she picked up #4 tabs 02/11/15 but never refilled the med...    Hx anxiety/ depression> on XANAX 0.'5mg'$ Tid prn and REMERON '15mg'$  Qhs prn...    Hx poor compliance w/ medical regimen> this was before Hospice involvement in her care (04/2015)  PLAN>>  01/10/16>   Respiratory-wise she is improved, CXR is improved, etc but she needs to continue taking O2 and meds regularly- O2 at 2L/min by [place] 24/7, NEBS w/ Albut using 1/2 vial TID every day followed by SYMBICORT160- 2spBid and new INCRUSE one inhalation daily;  XRay of Lspine shows new compression in L2, and Epic review showed that she only took Alendronate70 for 4 wks in Aug2016- she never refilled  the Rx;  Discussed CXR & Lumbar films w/ Sister-in-law care giver Nancy=> REC that Heidi [name] re-start Calcium/ VitD/  ALENDRONATE70/wk & stay on this (she never did); she has a back brace at home & pain meds from Hospice, needs PT for ambulation;  We decreased her Synthroid to 114mg/d as well... ROV planned in 6wks time. 02/23/16>   JWalterhas stabilized from the cardiopulm standpoint & she is instructed to continue her current meds regularly (Symbicort & Incruse) plus her NEBS, VentHFA, & Lasix20 as needed;  Looks like she needs CDoppler f/u, f/u FLP, management of her thyroid medication to insure compliance w/ Rx (we cut her to 779m today), and management of her osteoporosis/ bone building therapy & her back pain due to compression fxs => note sent to her PCP...      Plan:     Patient's Medications  New Prescriptions   BUDESONIDE-FORMOTEROL (SYMBICORT) 160-4.5 MCG/ACT INHALER    Inhale 2 puffs into the lungs 2 (two) times daily.   UMECLIDINIUM BROMIDE (INCRUSE ELLIPTA) 62.5 MCG/INH AEPB    Inhale 1 puff into the lungs daily.  Previous Medications   ALBUTEROL (PROVENTIL HFA;VENTOLIN HFA) 108 (90 BASE) MCG/ACT INHALER    Inhale 2 puffs into the lungs every 6 (six) hours as needed for wheezing or shortness of breath.   ALPRAZOLAM (XANAX) 0.5 MG TABLET    Take 1 tablet (0.5 mg total) by mouth 3 (three) times daily as needed for anxiety or sleep (air hunger. dyspnea).   FUROSEMIDE (LASIX) 20 MG TABLET    Take 1 tablet (20 mg total) by mouth daily as needed for fluid or edema.   GABAPENTIN (NEURONTIN) 300 MG CAPSULE    Take 1 capsule (300 mg total) by mouth 2 (two) times daily.   HALOBETASOL (ULTRAVATE) 0.05 % OINTMENT    Apply topically 2 (two) times daily.     LEVOTHYROXINE (SYNTHROID, LEVOTHROID) 75 MCG TABLET (dose decr from 112=>75)    TAKE ONE TABLET BY MOUTH ONCE DAILY BEFORE BREAKFAST.   MIRTAZAPINE (REMERON) 15 MG TABLET    Take 1 tablet (15 mg total) by mouth at bedtime.   MORPHINE (ROXANOL) 20 MG/ML CONCENTRATED SOLUTION    Take 0.5 mLs (10 mg total) by mouth every 2 (two) hours as needed for moderate pain,  severe pain, anxiety or shortness of breath.   OXYCODONE-ACETAMINOPHEN (PERCOCET) 5-325 MG TABLET    Take 1 to 2 tablets by mouth every 6 hours as need for pain  Modified Medications   Modified Medication Previous Medication   ALBUTEROL (PROVENTIL) (2.5 MG/3ML) 0.083% NEBULIZER SOLUTION albuterol (PROVENTIL) (2.5 MG/3ML) 0.083% nebulizer solution      Use 1/2 vial of ALBUTEROL in NEBULIZER three times daily...    Take 3 mLs (2.5 mg total) by nebulization every 6 (six) hours as needed for wheezing or shortness of breath.  Discontinued Medications   FLUTICASONE-SALMETEROL (ADVAIR) 100-50 MCG/DOSE AEPB    Inhale 1 puff into the lungs 2 (two) times daily.   TIOTROPIUM (SPIRIVA HANDIHALER) 18 MCG INHALATION CAPSULE    Place 1 capsule (18 mcg total) into inhaler and inhale daily.

## 2016-02-25 ENCOUNTER — Other Ambulatory Visit: Payer: Self-pay | Admitting: Pulmonary Disease

## 2016-02-25 DIAGNOSIS — E039 Hypothyroidism, unspecified: Secondary | ICD-10-CM

## 2016-02-28 ENCOUNTER — Other Ambulatory Visit: Payer: Self-pay | Admitting: Pulmonary Disease

## 2016-02-28 MED ORDER — LEVOTHYROXINE SODIUM 75 MCG PO TABS: 75.0000 ug | ORAL_TABLET | Freq: Every day | ORAL | 11 refills | Status: DC

## 2016-03-01 DIAGNOSIS — J189 Pneumonia, unspecified organism: Secondary | ICD-10-CM | POA: Diagnosis not present

## 2016-03-01 DIAGNOSIS — Z8639 Personal history of other endocrine, nutritional and metabolic disease: Secondary | ICD-10-CM | POA: Diagnosis not present

## 2016-03-01 DIAGNOSIS — Z8673 Personal history of transient ischemic attack (TIA), and cerebral infarction without residual deficits: Secondary | ICD-10-CM | POA: Diagnosis not present

## 2016-03-01 DIAGNOSIS — Z681 Body mass index (BMI) 19 or less, adult: Secondary | ICD-10-CM | POA: Diagnosis not present

## 2016-03-01 DIAGNOSIS — I509 Heart failure, unspecified: Secondary | ICD-10-CM | POA: Diagnosis not present

## 2016-03-01 DIAGNOSIS — J441 Chronic obstructive pulmonary disease with (acute) exacerbation: Secondary | ICD-10-CM | POA: Diagnosis not present

## 2016-03-07 DIAGNOSIS — I509 Heart failure, unspecified: Secondary | ICD-10-CM | POA: Diagnosis not present

## 2016-03-07 DIAGNOSIS — J441 Chronic obstructive pulmonary disease with (acute) exacerbation: Secondary | ICD-10-CM | POA: Diagnosis not present

## 2016-03-07 DIAGNOSIS — Z681 Body mass index (BMI) 19 or less, adult: Secondary | ICD-10-CM | POA: Diagnosis not present

## 2016-03-07 DIAGNOSIS — J189 Pneumonia, unspecified organism: Secondary | ICD-10-CM | POA: Diagnosis not present

## 2016-03-07 DIAGNOSIS — Z8673 Personal history of transient ischemic attack (TIA), and cerebral infarction without residual deficits: Secondary | ICD-10-CM | POA: Diagnosis not present

## 2016-03-07 DIAGNOSIS — Z8639 Personal history of other endocrine, nutritional and metabolic disease: Secondary | ICD-10-CM | POA: Diagnosis not present

## 2016-03-08 DIAGNOSIS — I509 Heart failure, unspecified: Secondary | ICD-10-CM | POA: Diagnosis not present

## 2016-03-08 DIAGNOSIS — Z681 Body mass index (BMI) 19 or less, adult: Secondary | ICD-10-CM | POA: Diagnosis not present

## 2016-03-08 DIAGNOSIS — Z8673 Personal history of transient ischemic attack (TIA), and cerebral infarction without residual deficits: Secondary | ICD-10-CM | POA: Diagnosis not present

## 2016-03-08 DIAGNOSIS — J189 Pneumonia, unspecified organism: Secondary | ICD-10-CM | POA: Diagnosis not present

## 2016-03-08 DIAGNOSIS — Z8639 Personal history of other endocrine, nutritional and metabolic disease: Secondary | ICD-10-CM | POA: Diagnosis not present

## 2016-03-08 DIAGNOSIS — J441 Chronic obstructive pulmonary disease with (acute) exacerbation: Secondary | ICD-10-CM | POA: Diagnosis not present

## 2016-03-09 DIAGNOSIS — Z8673 Personal history of transient ischemic attack (TIA), and cerebral infarction without residual deficits: Secondary | ICD-10-CM | POA: Diagnosis not present

## 2016-03-09 DIAGNOSIS — J441 Chronic obstructive pulmonary disease with (acute) exacerbation: Secondary | ICD-10-CM | POA: Diagnosis not present

## 2016-03-09 DIAGNOSIS — I509 Heart failure, unspecified: Secondary | ICD-10-CM | POA: Diagnosis not present

## 2016-03-09 DIAGNOSIS — J189 Pneumonia, unspecified organism: Secondary | ICD-10-CM | POA: Diagnosis not present

## 2016-03-09 DIAGNOSIS — Z8639 Personal history of other endocrine, nutritional and metabolic disease: Secondary | ICD-10-CM | POA: Diagnosis not present

## 2016-03-09 DIAGNOSIS — Z681 Body mass index (BMI) 19 or less, adult: Secondary | ICD-10-CM | POA: Diagnosis not present

## 2016-03-14 DIAGNOSIS — Z8639 Personal history of other endocrine, nutritional and metabolic disease: Secondary | ICD-10-CM | POA: Diagnosis not present

## 2016-03-14 DIAGNOSIS — J441 Chronic obstructive pulmonary disease with (acute) exacerbation: Secondary | ICD-10-CM | POA: Diagnosis not present

## 2016-03-14 DIAGNOSIS — I509 Heart failure, unspecified: Secondary | ICD-10-CM | POA: Diagnosis not present

## 2016-03-14 DIAGNOSIS — Z681 Body mass index (BMI) 19 or less, adult: Secondary | ICD-10-CM | POA: Diagnosis not present

## 2016-03-14 DIAGNOSIS — J189 Pneumonia, unspecified organism: Secondary | ICD-10-CM | POA: Diagnosis not present

## 2016-03-14 DIAGNOSIS — Z8673 Personal history of transient ischemic attack (TIA), and cerebral infarction without residual deficits: Secondary | ICD-10-CM | POA: Diagnosis not present

## 2016-03-15 DIAGNOSIS — I509 Heart failure, unspecified: Secondary | ICD-10-CM | POA: Diagnosis not present

## 2016-03-15 DIAGNOSIS — J189 Pneumonia, unspecified organism: Secondary | ICD-10-CM | POA: Diagnosis not present

## 2016-03-15 DIAGNOSIS — Z681 Body mass index (BMI) 19 or less, adult: Secondary | ICD-10-CM | POA: Diagnosis not present

## 2016-03-15 DIAGNOSIS — J441 Chronic obstructive pulmonary disease with (acute) exacerbation: Secondary | ICD-10-CM | POA: Diagnosis not present

## 2016-03-15 DIAGNOSIS — Z8673 Personal history of transient ischemic attack (TIA), and cerebral infarction without residual deficits: Secondary | ICD-10-CM | POA: Diagnosis not present

## 2016-03-15 DIAGNOSIS — Z8639 Personal history of other endocrine, nutritional and metabolic disease: Secondary | ICD-10-CM | POA: Diagnosis not present

## 2016-03-17 DIAGNOSIS — I509 Heart failure, unspecified: Secondary | ICD-10-CM | POA: Diagnosis not present

## 2016-03-17 DIAGNOSIS — Z8673 Personal history of transient ischemic attack (TIA), and cerebral infarction without residual deficits: Secondary | ICD-10-CM | POA: Diagnosis not present

## 2016-03-17 DIAGNOSIS — Z8639 Personal history of other endocrine, nutritional and metabolic disease: Secondary | ICD-10-CM | POA: Diagnosis not present

## 2016-03-17 DIAGNOSIS — Z681 Body mass index (BMI) 19 or less, adult: Secondary | ICD-10-CM | POA: Diagnosis not present

## 2016-03-17 DIAGNOSIS — Z8739 Personal history of other diseases of the musculoskeletal system and connective tissue: Secondary | ICD-10-CM | POA: Diagnosis not present

## 2016-03-17 DIAGNOSIS — J189 Pneumonia, unspecified organism: Secondary | ICD-10-CM | POA: Diagnosis not present

## 2016-03-17 DIAGNOSIS — J441 Chronic obstructive pulmonary disease with (acute) exacerbation: Secondary | ICD-10-CM | POA: Diagnosis not present

## 2016-03-21 DIAGNOSIS — J189 Pneumonia, unspecified organism: Secondary | ICD-10-CM | POA: Diagnosis not present

## 2016-03-21 DIAGNOSIS — J441 Chronic obstructive pulmonary disease with (acute) exacerbation: Secondary | ICD-10-CM | POA: Diagnosis not present

## 2016-03-21 DIAGNOSIS — I509 Heart failure, unspecified: Secondary | ICD-10-CM | POA: Diagnosis not present

## 2016-03-21 DIAGNOSIS — Z8639 Personal history of other endocrine, nutritional and metabolic disease: Secondary | ICD-10-CM | POA: Diagnosis not present

## 2016-03-21 DIAGNOSIS — Z681 Body mass index (BMI) 19 or less, adult: Secondary | ICD-10-CM | POA: Diagnosis not present

## 2016-03-21 DIAGNOSIS — Z8673 Personal history of transient ischemic attack (TIA), and cerebral infarction without residual deficits: Secondary | ICD-10-CM | POA: Diagnosis not present

## 2016-03-22 DIAGNOSIS — I509 Heart failure, unspecified: Secondary | ICD-10-CM | POA: Diagnosis not present

## 2016-03-22 DIAGNOSIS — Z8639 Personal history of other endocrine, nutritional and metabolic disease: Secondary | ICD-10-CM | POA: Diagnosis not present

## 2016-03-22 DIAGNOSIS — Z8673 Personal history of transient ischemic attack (TIA), and cerebral infarction without residual deficits: Secondary | ICD-10-CM | POA: Diagnosis not present

## 2016-03-22 DIAGNOSIS — Z681 Body mass index (BMI) 19 or less, adult: Secondary | ICD-10-CM | POA: Diagnosis not present

## 2016-03-22 DIAGNOSIS — J441 Chronic obstructive pulmonary disease with (acute) exacerbation: Secondary | ICD-10-CM | POA: Diagnosis not present

## 2016-03-22 DIAGNOSIS — J189 Pneumonia, unspecified organism: Secondary | ICD-10-CM | POA: Diagnosis not present

## 2016-03-23 DIAGNOSIS — J441 Chronic obstructive pulmonary disease with (acute) exacerbation: Secondary | ICD-10-CM | POA: Diagnosis not present

## 2016-03-23 DIAGNOSIS — Z8639 Personal history of other endocrine, nutritional and metabolic disease: Secondary | ICD-10-CM | POA: Diagnosis not present

## 2016-03-23 DIAGNOSIS — Z8673 Personal history of transient ischemic attack (TIA), and cerebral infarction without residual deficits: Secondary | ICD-10-CM | POA: Diagnosis not present

## 2016-03-23 DIAGNOSIS — Z681 Body mass index (BMI) 19 or less, adult: Secondary | ICD-10-CM | POA: Diagnosis not present

## 2016-03-23 DIAGNOSIS — I509 Heart failure, unspecified: Secondary | ICD-10-CM | POA: Diagnosis not present

## 2016-03-23 DIAGNOSIS — J189 Pneumonia, unspecified organism: Secondary | ICD-10-CM | POA: Diagnosis not present

## 2016-03-28 DIAGNOSIS — Z681 Body mass index (BMI) 19 or less, adult: Secondary | ICD-10-CM | POA: Diagnosis not present

## 2016-03-28 DIAGNOSIS — I509 Heart failure, unspecified: Secondary | ICD-10-CM | POA: Diagnosis not present

## 2016-03-28 DIAGNOSIS — J441 Chronic obstructive pulmonary disease with (acute) exacerbation: Secondary | ICD-10-CM | POA: Diagnosis not present

## 2016-03-28 DIAGNOSIS — Z8639 Personal history of other endocrine, nutritional and metabolic disease: Secondary | ICD-10-CM | POA: Diagnosis not present

## 2016-03-28 DIAGNOSIS — Z8673 Personal history of transient ischemic attack (TIA), and cerebral infarction without residual deficits: Secondary | ICD-10-CM | POA: Diagnosis not present

## 2016-03-28 DIAGNOSIS — J189 Pneumonia, unspecified organism: Secondary | ICD-10-CM | POA: Diagnosis not present

## 2016-03-29 DIAGNOSIS — Z8639 Personal history of other endocrine, nutritional and metabolic disease: Secondary | ICD-10-CM | POA: Diagnosis not present

## 2016-03-29 DIAGNOSIS — I509 Heart failure, unspecified: Secondary | ICD-10-CM | POA: Diagnosis not present

## 2016-03-29 DIAGNOSIS — Z681 Body mass index (BMI) 19 or less, adult: Secondary | ICD-10-CM | POA: Diagnosis not present

## 2016-03-29 DIAGNOSIS — Z8673 Personal history of transient ischemic attack (TIA), and cerebral infarction without residual deficits: Secondary | ICD-10-CM | POA: Diagnosis not present

## 2016-03-29 DIAGNOSIS — J441 Chronic obstructive pulmonary disease with (acute) exacerbation: Secondary | ICD-10-CM | POA: Diagnosis not present

## 2016-03-29 DIAGNOSIS — J189 Pneumonia, unspecified organism: Secondary | ICD-10-CM | POA: Diagnosis not present

## 2016-03-31 DIAGNOSIS — Z8639 Personal history of other endocrine, nutritional and metabolic disease: Secondary | ICD-10-CM | POA: Diagnosis not present

## 2016-03-31 DIAGNOSIS — J441 Chronic obstructive pulmonary disease with (acute) exacerbation: Secondary | ICD-10-CM | POA: Diagnosis not present

## 2016-03-31 DIAGNOSIS — I509 Heart failure, unspecified: Secondary | ICD-10-CM | POA: Diagnosis not present

## 2016-03-31 DIAGNOSIS — J189 Pneumonia, unspecified organism: Secondary | ICD-10-CM | POA: Diagnosis not present

## 2016-03-31 DIAGNOSIS — Z681 Body mass index (BMI) 19 or less, adult: Secondary | ICD-10-CM | POA: Diagnosis not present

## 2016-03-31 DIAGNOSIS — Z8673 Personal history of transient ischemic attack (TIA), and cerebral infarction without residual deficits: Secondary | ICD-10-CM | POA: Diagnosis not present

## 2016-04-03 DIAGNOSIS — Z681 Body mass index (BMI) 19 or less, adult: Secondary | ICD-10-CM | POA: Diagnosis not present

## 2016-04-03 DIAGNOSIS — Z8639 Personal history of other endocrine, nutritional and metabolic disease: Secondary | ICD-10-CM | POA: Diagnosis not present

## 2016-04-03 DIAGNOSIS — I509 Heart failure, unspecified: Secondary | ICD-10-CM | POA: Diagnosis not present

## 2016-04-03 DIAGNOSIS — Z8673 Personal history of transient ischemic attack (TIA), and cerebral infarction without residual deficits: Secondary | ICD-10-CM | POA: Diagnosis not present

## 2016-04-03 DIAGNOSIS — J441 Chronic obstructive pulmonary disease with (acute) exacerbation: Secondary | ICD-10-CM | POA: Diagnosis not present

## 2016-04-03 DIAGNOSIS — J189 Pneumonia, unspecified organism: Secondary | ICD-10-CM | POA: Diagnosis not present

## 2016-04-04 ENCOUNTER — Other Ambulatory Visit: Payer: Self-pay | Admitting: Adult Health

## 2016-04-04 DIAGNOSIS — Z8673 Personal history of transient ischemic attack (TIA), and cerebral infarction without residual deficits: Secondary | ICD-10-CM | POA: Diagnosis not present

## 2016-04-04 DIAGNOSIS — Z8639 Personal history of other endocrine, nutritional and metabolic disease: Secondary | ICD-10-CM | POA: Diagnosis not present

## 2016-04-04 DIAGNOSIS — I509 Heart failure, unspecified: Secondary | ICD-10-CM | POA: Diagnosis not present

## 2016-04-04 DIAGNOSIS — J441 Chronic obstructive pulmonary disease with (acute) exacerbation: Secondary | ICD-10-CM | POA: Diagnosis not present

## 2016-04-04 DIAGNOSIS — Z681 Body mass index (BMI) 19 or less, adult: Secondary | ICD-10-CM | POA: Diagnosis not present

## 2016-04-04 DIAGNOSIS — J189 Pneumonia, unspecified organism: Secondary | ICD-10-CM | POA: Diagnosis not present

## 2016-04-05 ENCOUNTER — Other Ambulatory Visit: Payer: Self-pay | Admitting: Adult Health

## 2016-04-05 ENCOUNTER — Telehealth: Payer: Self-pay | Admitting: Adult Health

## 2016-04-05 DIAGNOSIS — E785 Hyperlipidemia, unspecified: Secondary | ICD-10-CM

## 2016-04-05 DIAGNOSIS — E039 Hypothyroidism, unspecified: Secondary | ICD-10-CM

## 2016-04-05 DIAGNOSIS — M810 Age-related osteoporosis without current pathological fracture: Secondary | ICD-10-CM

## 2016-04-05 NOTE — Telephone Encounter (Signed)
I have entered labs for thyroid, vitamin D and lipids. Please ask her to come fasting.

## 2016-04-05 NOTE — Telephone Encounter (Signed)
Is this ok?

## 2016-04-05 NOTE — Telephone Encounter (Signed)
Pt would like to have tsh blood work, Can I sch?

## 2016-04-06 DIAGNOSIS — Z8639 Personal history of other endocrine, nutritional and metabolic disease: Secondary | ICD-10-CM | POA: Diagnosis not present

## 2016-04-06 DIAGNOSIS — Z8673 Personal history of transient ischemic attack (TIA), and cerebral infarction without residual deficits: Secondary | ICD-10-CM | POA: Diagnosis not present

## 2016-04-06 DIAGNOSIS — Z681 Body mass index (BMI) 19 or less, adult: Secondary | ICD-10-CM | POA: Diagnosis not present

## 2016-04-06 DIAGNOSIS — J441 Chronic obstructive pulmonary disease with (acute) exacerbation: Secondary | ICD-10-CM | POA: Diagnosis not present

## 2016-04-06 DIAGNOSIS — I509 Heart failure, unspecified: Secondary | ICD-10-CM | POA: Diagnosis not present

## 2016-04-06 DIAGNOSIS — J189 Pneumonia, unspecified organism: Secondary | ICD-10-CM | POA: Diagnosis not present

## 2016-04-06 NOTE — Telephone Encounter (Signed)
Pt has been sch

## 2016-04-10 ENCOUNTER — Other Ambulatory Visit (INDEPENDENT_AMBULATORY_CARE_PROVIDER_SITE_OTHER): Payer: Medicare Other

## 2016-04-10 ENCOUNTER — Ambulatory Visit (INDEPENDENT_AMBULATORY_CARE_PROVIDER_SITE_OTHER): Payer: Medicare Other | Admitting: Family Medicine

## 2016-04-10 ENCOUNTER — Encounter: Payer: Self-pay | Admitting: Pulmonary Disease

## 2016-04-10 ENCOUNTER — Ambulatory Visit (INDEPENDENT_AMBULATORY_CARE_PROVIDER_SITE_OTHER): Payer: PRIVATE HEALTH INSURANCE | Admitting: Pulmonary Disease

## 2016-04-10 VITALS — BP 128/74 | HR 90 | Temp 97.0°F | Resp 18 | Ht 63.0 in | Wt 120.0 lb

## 2016-04-10 DIAGNOSIS — M545 Low back pain: Secondary | ICD-10-CM

## 2016-04-10 DIAGNOSIS — E785 Hyperlipidemia, unspecified: Secondary | ICD-10-CM

## 2016-04-10 DIAGNOSIS — Z681 Body mass index (BMI) 19 or less, adult: Secondary | ICD-10-CM | POA: Diagnosis not present

## 2016-04-10 DIAGNOSIS — I509 Heart failure, unspecified: Secondary | ICD-10-CM | POA: Diagnosis not present

## 2016-04-10 DIAGNOSIS — M810 Age-related osteoporosis without current pathological fracture: Secondary | ICD-10-CM | POA: Diagnosis not present

## 2016-04-10 DIAGNOSIS — Z23 Encounter for immunization: Secondary | ICD-10-CM

## 2016-04-10 DIAGNOSIS — J441 Chronic obstructive pulmonary disease with (acute) exacerbation: Secondary | ICD-10-CM | POA: Diagnosis not present

## 2016-04-10 DIAGNOSIS — I5032 Chronic diastolic (congestive) heart failure: Secondary | ICD-10-CM

## 2016-04-10 DIAGNOSIS — E039 Hypothyroidism, unspecified: Secondary | ICD-10-CM

## 2016-04-10 DIAGNOSIS — J449 Chronic obstructive pulmonary disease, unspecified: Secondary | ICD-10-CM | POA: Diagnosis not present

## 2016-04-10 DIAGNOSIS — J9611 Chronic respiratory failure with hypoxia: Secondary | ICD-10-CM

## 2016-04-10 DIAGNOSIS — J189 Pneumonia, unspecified organism: Secondary | ICD-10-CM | POA: Diagnosis not present

## 2016-04-10 DIAGNOSIS — R9389 Abnormal findings on diagnostic imaging of other specified body structures: Secondary | ICD-10-CM | POA: Insufficient documentation

## 2016-04-10 DIAGNOSIS — R938 Abnormal findings on diagnostic imaging of other specified body structures: Secondary | ICD-10-CM

## 2016-04-10 DIAGNOSIS — Z8673 Personal history of transient ischemic attack (TIA), and cerebral infarction without residual deficits: Secondary | ICD-10-CM | POA: Diagnosis not present

## 2016-04-10 DIAGNOSIS — Z8639 Personal history of other endocrine, nutritional and metabolic disease: Secondary | ICD-10-CM | POA: Diagnosis not present

## 2016-04-10 LAB — LIPID PANEL
CHOLESTEROL: 267 mg/dL — AB (ref 0–200)
HDL: 84 mg/dL (ref >39.00)
LDL Cholesterol: 149 mg/dL — ABNORMAL HIGH (ref 0–99)
NonHDL: 183.01
TRIGLYCERIDES: 169 mg/dL — AB (ref 0.0–149.0)
Total CHOL/HDL Ratio: 3
VLDL: 33.8 mg/dL (ref 0.0–40.0)

## 2016-04-10 LAB — VITAMIN D 25 HYDROXY (VIT D DEFICIENCY, FRACTURES): VITD: 15.77 ng/mL — AB (ref 30.00–100.00)

## 2016-04-10 LAB — TSH: TSH: 0.67 u[IU]/mL (ref 0.35–4.50)

## 2016-04-10 NOTE — Patient Instructions (Addendum)
Today we updated your med list in our EPIC system...    Continue your current medications the same...  We will ask [facility] to replace your oxygen tanks with a PORTABLE OXYGEN CONCENTRATOR...    Continue using 1.5 to 2 L/min flow...  We will arrange for a follow up CT scan of your chest to compare to the prev scan 02/2015...    We will contact you w/ the results when available...   Call for any questions...  Let's plan a follow up visit in 68months, sooner if needed for problems.Marland KitchenMarland Kitchen

## 2016-04-10 NOTE — Progress Notes (Signed)
Subjective:     Patient ID: Heidi [name], female   DOB: 12/18/1940, 75 y.o.   MRN: [id number]  HPI 75 y/o WF former smoker who quit 2016 w/ severe COPD/emphysema & chronic hypoxemic and hypercarbic resp failure, placed on Hospice by Heidi [name] in 2016, hx pneumonia and small bilat pulm nodules on CT Chest, and diastolic CHF...   ~  May 13, 2015:  Initial pulmonary consult by SN>        Heidi [name] was Hosp 10/3 - 04/27/15 by [facility] after presenting via EMS w/ cough, weakness, increased SOB; she was still smoking, not on home O2; found to have COPD exac & CAP w/ bibasilar opacities, acute on chronic hypoxemic & hypercarbic resp failure, & superimposed CHF w/ bilat L>R pleural effusions (this was tapped 10/9 yielding transudative fluid, neg cytology, neg culture)... She was seen by CCM- both MR & Heidi [name] attended her in the hosp; Treated w/ Rocephin/ Zithromax, Solumedrol, Nebs, & brief stay in step-down unit on BiPap; disch on ADVAIR100-1spBid, SPIRIVA daily, PRED taper, NEBS w/ Albut... She was also treated for acute on chronic diastolic CHF- mod dil RV & reduced RV sys function (w/ preserved LVF) on 2DEcho & diuresed w/ Lasix but only disch on 20mg prn for edema... Hospice was consulted (Heidi [name]'s note reviewed) and she has entered the Hospice program (she is a DNR, DNI) w/ a prognosis est <6mo & she was disch on ROXANOL 20mg/ml--take 1/2 ml Q2H as needed for pain, SOB, anxiety...  She last saw Heidi [name] 11/2013, she has established new PCP- Heidi [name] & Heidi [name]- has post hosp f/u pending; Note> she cancelled 2 appts w/ DrMcQuaid recently...       Currently Heidi [name] is very stoic- states she's not having any breathing problems at the present time & credits the thoracentesis w/ her remarkable improvement; her last cigarette was 04/19/15 prior to her admission; she was disch on O2 at 4L/min & has an O2sat=89% on this in the office today (RA sat was 74%); she was unable to perform PFT today; we had  a frank discussion about her severe, end-stage COPD...       Epic records indicate that she was seen by DrWert in 2011> on Spiriva +/- Advair, stated she quit smoking 2011 on adm for COPD exac, PFT reported FEV1=1.32 (77%), FEV1/FVC ratio=50%, DLCO=70%; CXR showed COPD & biapical pleuroparenchymal scarring, CT Chest 4/11 showed ?mult pulm nodules ?early cavitation r/o MAI, cultures- only NTF, O2sat=95% on RA...  Medical Hx>   She had an annual wellness exam from her PCP 02/2015> COPD, Hx pulm nodule, Hx of pneumonia; Hypothyroidism; adenomatous colonic polyps; Hyperlipidemia; Sigmoid volvulus; Depression; Cerebrovascular disease; Positive ANA; Diverticulosis; Esophageal stricture; Arthritis; History of stroke; Internal hemorrhoids; and Rectal ulcer; protein-calorie malnutrition (she weighs 78 lbs...  EXAM shows Afeb, VSS, O2sat=89% on O2 at 4L/min; Wt=78#, 5'2"Tall, BMI=16;  Heent- neg, mallampati1;  Chest- decr BS bilat, scat rhonchi, & end-exp wheezing;  Heart- RR gr1/6 SEM w/o r/g;  Abd soft, neg;  Ext- w/o c/c/ tr edema...   Baseline CXR 12/31/14 showed hyperinflation c/w COPD, biapical pleuroparenchymal scarring, nipple shadows and T12 compression fx...  CT Chest 02/22/15 showed cardiomeg, atherosclerotic changes in Ao & coronaries, small right effusion; emhysema w/ several additional findings- RUL & lingular subpleural opacities, & 5mm LUL & RLL nodules; osteopenia and T7 +T12 compression fractures...   CXRs performed during the 04/2015 Admission showed cardiomeg, CHF w/ left effusion, COPD w/ interstitial edema & LLL opac => improved post thoracentesis   10/9 w/ 400cc removed...  ABGs during the 10/16 Hosp showed pCO2 in the 73-87 range w/ resp acidosis...   LABS 10/16 reviewed> CBC- ok w/ Hg=13 range;  Chems- note HCO3=44-45 range & she may benefit drom Acetazolamide added to her diuretic regimen if more aggressive treatment is desired...  IMP>> Heidi [name] has severe COPD w/ acute on chronic hypoxemic &  hypercarbic resp failure & cor pulmonale; her last cigarette was the day of admission 04/19/15... She also has a hx of poor compliance w/ med rx & she has once again stopped the prescribed meds perceiving that she is much better since the thoracentesis; she was referred to Hospice for outpt follow up & will continue to see her PCPs at [facility]; I told her I would be happy to recheck at any time if she so desired...  NOTE> since her disch she states that her breathing has been "so much better" that she hasn't needed her inhalers or the nebulizer, and she's off the Pred; she states she is taking the ASA81, Lasix20, Synthroid137, Xanax, and Remeron; she apparently hasn't needed the Roxanol yet...  We had a frank discussion about her end-stage disease & we "proved it to her" by checking her O2sat off the oxygen (74%), and attempting to get a Spirometry tracing (she couldn't do it);  Pt is asked to get back on her meds & we outlined a regimen>  NEBS w/ Albut Qid (breakfast, lunch, dinner, & bedtime);  ADVAIR100- 2spBid (taken after the Neb at breakfast & dinner);  SPIRIVA via handihaler once daily (taken after the Neb at lunch)...    ~  January 10, 2016:  60moROV w/ SN>  NOTE: family has moved her into her own apt 12/2015) next to her Sister-in-law/ care giver's apt      Heidi [name] for a follow up visit- she has severe COPD/emphysema w/ chronic hypoxemic and hypercarbic resp failure; [facility]; she has been followed by Heidi [name] since then;  She returns today w/ her sister-in-law care giver Heidi [name]#[phone] that Hospice is planning to stop services soon but pt is c/o severe LBP x 6wks ever since she lifted a chlorox bottle & felt a "pop" in her back; she has mentioned this to Hospice but all they have done is to adjust her pain meds (Percocet5-325 & Roxanol 283mml- 0.71m70m2H prn) without further eval;  She states that her breathing  is OK- meaning she could get about in her apt before the episode of acute LBP 2wks ago;I note that she has not had any lab work or f/u CXR since [facility] & she is clearly in need of same- she has a hx of compression fx of spine and osteoporosis but not on any bone building therapy... We reviewed the following medical problems during today's office visit >>     Severe COPD> she could not perform simple spirometry when attempted 04/2015; on NEBS w/ Albut Q6H prn but it makes her too jittery, SYMBICORT160-2spBid & [facility];  REC to use Albut 1/2vial in NEB Tid regularly followed by Symbicort160-2spBid & try INCRUSE once daily => we plan ROV in 6wks w/ repeat spirometry & oximetry if she can walk...     Chronic hypoxemic and hypercarbic resp failure> O2sat was 74% on RA 10/16 [place]73[place]87 w/ resp acidosis; she is now on O2 at 2.5L/min regularly &  looks much better, chest is clearer and labs improved; we will check ambulatory oximetry on ret if able to walk for [place]...    Hx pneumonia> resolved- CXR 12/2015 shows COPD & is clear, no opacity...    Hx bilat pulm nodules on CT Chest> Her CXR is much improved 01/10/16; she will be due for a f/u CT Chest on return 02/2016...    Ex-smoker, quit 04/2015>  congrats on not smoking!    Diastolic CHF w/ pulm edema & transudative left pleural effusion 04/2015> on Lasix20 prn only    Hx cerebrovasc dis & stroke> this must have occurred in [place]- no data in EPIC & no prev CDopplers to review; rec to take ASA81/d & we will check CDopplers later...    MEDICAL Issues>  Hyperlipidemia (not on meds),  Hypothyroidism (on Synthroid137/d),  Prot-cal malnutrition (wt is up 38# & BMI=21 now),      Hx HH repair & esoph stricture> not on PPI rx at present    Hx colon polyps, divertics, hems (w/ banding), & cecal volvulus s/p right hemicolectomy at [facility] 2012> aware...    S/P TAH & BSO> aware    DJD, Back pain w/  compression fxs> she indicates that she "cracked my spine" about 67yr ago, says she was hosp x4d w/ MRI, had brace applied- no shots or vertebroplasty ([facility] are [facility] reports in Epic from 2012-2016;      Osteoporosis> she had BMD 01/2015 by PCP w/ TScore -4.8 in Lspine & -3.7 in Left FemNeck; placed on Ca++, VitD OTC, and FOSAMAX70/wk started; she picked up #4 tabs 02/11/15 but never refilled the med...    Hx anxiety/ depression> on XANAX 0.571mid prn and REMERON 1559mhs prn...    Hx poor compliance w/ medical regimen> this was before Hospice involvement in her care (04/2015) EXAM shows Afeb, VSS, O2sat=97% on O2 at 2.5L/min; Wt was 116# in [id number] 38# to BMI=21);  Heent- neg, mallampati1;  Chest- decr BS bilat w/o w/r/r;  Heart- RR gr1/6 SEM w/o r/g;  Abd soft, neg;  Ext- w/o c/c/e in wheelchair & won't stand/walk due to LBP...  CXR 01/10/16>  Much improved- some hyperinflation, bilat apical pleural thickening & bibasilar scarring, no effusion/ edema/ consolidation/ adenopathy, stable ant wedging of T12 w/ kyphosis...   Lumbar spine XRay 01/10/16>  Anterior wedging of L2 is new, old fx of T12, no other fxs & no spondylolithesis- disc sp narrowing L2-3 & L3-4, facet arthritis L5-S1, calcif in abd Ao...  LABS 12/2015>  Chems- wnl, HCO3=32, renal=wnl;  CBC- wnl;  TSH=0.01 on synthroid137;  BNP=70;  Sede=3 IMP/PLAN>>  Respiratory-wise she is improved, CXR is improved, etc but she needs to continue taking O2 and meds regularly- O2 at 2L/min by [place] 24/7, NEBS w/ Albut using 1/2 vial TID every day followed by SYMBICORT160- 2spBid and new INCRUSE one inhalation daily;  XRay of Lspine shows new compression in L2, and Epic review showed that she only took Alendronate70 for 4 wks in Aug2016- she never refilled the Rx;  Discussed CXR & Lumbar films w/ Sister-in-law care giver Heidi [name]=> REC that Heidi [name] re-start Calcium/ VitD/ ALENDRONATE70/wk & stay on this; she has a back  brace at home & pain meds from Hospice, needs PT for ambulation;  Second issue is her oversuppressed TSH on Synth137=> REC decr dose to 112 & recheck TSH on return... ROV planned in 6wks time.  ~  February 23, 2016:  6wk ROV & f/u w/ SN>  Heidi [name] has done remarkably well from the pulm standpoint- stabilized and improved suffic to be able to move into her own appt & care for herself w/ family support nearby; she says she feels like she's getting better too; she was working w/ PT at home but she thinks they were working her too hard & she sent them away- I explained that this therapy is the MOST IMPORTANT thing for her continued recovery!  She tells me that this is the last month of her yr on Hospice, she has Roxanol on her med list but thank goodness she hasn't needed it!  She is in a wheelchair in the office today- but tells me she is up & about at home=> we will walk her today & check O2sats...    1) Severe COPD> she could not perform simple spirometry when attempted 04/2015; on NEBS w/ Albut Q6H prn & using 1/2 vial 2-3x per wk, SYMBICORT160-2spBid & Incruse once daily; Hx medication noncompliance & not using the NEBS Tid as requested!     2) Chronic hypoxemic and hypercarbic resp failure> O2sat was 74% on RA 10/16 and ABGs in Hosp showed pCO2=73-87 w/ resp acidosis; she is now on O2 at 2.5L/min regularly & looks much better, chest is clearer and labs improved; ambulatory oximetry 02/2016 showed no desaturation on 2.5L/min...     3) Hx pneumonia> resolved- CXR 12/2015 shows COPD & clear, no opacity...    4) Hx bilat pulm nodules on CT Chest> Her CXR is much improved 01/10/16; she will be due for a f/u CT Chest to check these nodules soon...    5) Ex-smoker, quit 04/2015>  congrats on not smoking!    6) Diastolic CHF w/ pulm edema & transudative left pleural effusion 04/2015> on Lasix20 prn only now    7) Hx cerebrovasc dis & stroke> this must have occurred in [place]- no data in EPIC & no prev CDopplers to  review; rec to take ASA81/d & she will need CDopplers from her PCP on ret...    8) MEDICAL Issues>  Hyperlipidemia (not on meds),  Hypothyroidism (on Synthroid112/d),  Prot-cal malnutrition (wt is up to 114# & BMI=20 now); I reviewed all her TSH readings and called her Pharm ([facility] in [place]) & they confirmed poor med compliance in past- eery time her dose was increased she was NOT taking her meds regularly!  She is now on 112mcg/d and pill counts are accurate- TSH=0.02 so this dose is still too high for her; WE WILL DECR DOSE TO 75mcg/d and turn over follow up of this problem to her PCP- DrCory & Heidi [name]...    9) Hx HH repair & esoph stricture> not on PPI rx at present    10) Hx colon polyps, divertics, hems (w/ banding), & cecal volvulus s/p right hemicolectomy at [place] 2012> aware...    11) S/P TAH & BSO> aware    12) DJD, Back pain w/ compression fxs> she indicates that she "cracked my spine" about 4yrs ago, says she was hosp x4d w/ MRI, had brace applied- no shots or vertebroplasty (this must have been done at [facility] as there are no XRay reports in Epic from 2012-2016; she was taking Roxanol for pain but will be leaving hospice soon & her PCP will assume care of her osteoporosis, back pain=> pain meds and bone building therapy.    13) Osteoporosis> she had BMD 01/2015 by PCP w/ TScore -4.8 in Lspine & -3.7 in   Left FemNeck; placed on Ca++, VitD OTC, and FOSAMAX70/wk started; she picked up #4 tabs 02/11/15 but never refilled the med...    14) Hx anxiety/ depression> on XANAX 0.19mTid prn and REMERON 191mQhs prn...    15) Hx poor compliance w/ medical regimen> this was before Hospice involvement in her care (04/2015) EXAM shows Afeb, VSS, O2sat=97% on O2 at 1.5L/min; Wt = 114#, BMI=21;  Heent- neg, mallampati1;  Chest- decr BS bilat w/o w/r/r;  Heart- RR gr1/6 SEM w/o r/g;  Abd soft, neg;  Ext- w/o c/c/e in wheelchair today...  Ambulatory oximetry 02/23/16>  O2sat=94% on 1.5L/min at rest  w/ pulse=82/min;  She ambulated 3 Laps pushing her wheelchair w/ lowest O2sat=91% w/ HR=110/min...  LABS 02/23/16 showed TSH=0.02 on [place] 11231md taken regularly ([facility] IMP/PLAN>>  Heidi [name] stabilized from the cardiopulm standpoint & she is instructed to continue her current meds regularly Symbicort & Incruse) plus her NEBS, VentHFA, & Lasix20 as needed;  Looks like she needs CDoppler f/u, f/u FLP, management of her thyroid medication to insure compliance w/ Rx, and management of her osteoporosis/ bone building therapy & her back pain due to compression fxs => note sent to her PCP for all these...  ~  April 10, 2016:  6wk ROV w/ SN>  Heidi [name] for an add-on visit [place];  She was coughing up green mucus, called Hospice 7 they did a portable at home CXR that is said to be OK, clear, and treated her w/ Prednisone, "It was just a virus" she says, now improved... She is actually doing very well w/o acute resp symptoms and she has remained on her Pred10, NEBS w/ Albut- 1/2 vial Tid, Symbicort160-2Bid, Incruse once daily... She is c/o her port O2 system-- has difficulty filling the tanks, too heavy for her, she's "scared of it"=> needs POC (wants simply-Go mini) & [facility]... She is also due for her follow up CT Chest to compare to 02/2015...    Since she was here last she had a f/u visit w/ PCP- CorDorothyann Peng [place] depression- improved on Remeron for sleep, LBP- hospice XRays reported neg but showed L2 & T12 compressions- given Medrol dosepak & Salonpas patches... He has since reviewed my last OV note w/ requests for [facility] f/u for her medical issues- hypothyroid w/oversuppressed TSH on Synthroid (we cut her down to 48m62m), osteoporosis & compression fxs, due for f/u CDopplers...  SEE PROB LIST ABOVE>>     EXAM shows Afeb, VSS, O2sat=95% on O2  at ?3L/min; Wt = 120#, BMI=22;  Heent- neg, mallampati1;  Chest- decr BS bilat w/o w/r/r;  Heart- RR gr1/6 SEM w/o r/g;  Abd soft, neg;  Ext- w/o c/c/e;  Neuro- weak, no focal deficits...  LABS 04/10/16 per Heidi> TSH=0.67 on Synthroid75- continue same;  VitD=16 & needs supplement w/ 50K weekly... IMP/PLAN>>  COPD/ chr resp failure stable on current regimen- continue same;  Thyroid is improved on the 48mc28mtaken regularly- continue same;  She needs VitD and bone building therapy=> start VitD50K per week and ALENDRONATE 70mg 82mper week- must get these filled regularly & take them weekly going forward without fail...    Past Medical History:  Diagnosis Date  . Arthritis   . Cerebrovascular disease   . Chronic diastolic CHF (congestive heart failure) (HCC) [id number]  . COPD (chronic obstructive pulmonary  disease) ([place])   . Depression   . Diverticulosis   . Esophageal stricture   . History of pneumonia   . History of stroke    mild oncoming  . 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 Hyperlipidemia   . Hypothyroidism   . Internal hemorrhoids   . Osteoporosis   . Pneumonia    HX of  . Positive ANA (antinuclear antibody)   . Pulmonary nodule   . Rectal ulcer   . Shortness of breath    periodically with COPD flair up  . 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[facility])     Past Surgical History:  Procedure Laterality Date  . BLADDER SURGERY     tack  . CATARACT EXTRACTION     bilateral  . COLONOSCOPY  06/22/2011   Procedure: COLONOSCOPY;  Surgeon: Lafayette Dragon, MD;  Location: [facility] ENDOSCOPY;  Service: Endoscopy;  Laterality: N/A;  . GASTRIC VARICES BANDING  06/22/2011   Procedure: HEMORRHOID BANDING;  Surgeon: Lafayette Dragon, MD;  Location: [facility] ENDOSCOPY;  Service: Endoscopy;  Laterality: N/A;  . HEMICOLECTOMY  2012   emergent surgery [facility]  . HIATAL HERNIA REPAIR     x 2  . TONSILLECTOMY    . TOTAL ABDOMINAL HYSTERECTOMY W/ BILATERAL SALPINGOOPHORECTOMY  1998     Outpatient Encounter Prescriptions as of 04/10/2016  Medication Sig  . albuterol (PROVENTIL HFA;VENTOLIN HFA) 108 (90 BASE) MCG/ACT inhaler Inhale 2 puffs into the lungs every 6 (six) hours as needed for wheezing or shortness of breath.  Marland Kitchen albuterol (PROVENTIL) (2.5 MG/3ML) 0.083% nebulizer solution Use 1/2 vial of ALBUTEROL in NEBULIZER three times daily...  . alendronate (FOSAMAX) 70 MG tablet Take 1 tablet (70 mg total) by mouth once a week. Take with a full glass of water on an empty stomach first thing in the morning.  Marland Kitchen ALPRAZolam (XANAX) 0.5 MG tablet Take 1 tablet (0.5 mg total) by mouth 3 (three) times daily as needed for anxiety or sleep (air hunger. dyspnea).  . budesonide-formoterol (SYMBICORT) 160-4.5 MCG/ACT inhaler Inhale 2 puffs into the lungs 2 (two) times daily.  . furosemide (LASIX) 20 MG tablet Take 1 tablet (20 mg total) by mouth daily as needed for fluid or edema.  . gabapentin (NEURONTIN) 300 MG capsule Take 1 capsule (300 mg total) by mouth 2 (two) times daily.  . halobetasol (ULTRAVATE) 0.05 % ointment Apply topically 2 (two) times daily.    Marland Kitchen levothyroxine (SYNTHROID) 75 MCG tablet Take 1 tablet (75 mcg total) by mouth daily before breakfast.  . mirtazapine (REMERON) 30 MG tablet Take 1 tablet (30 mg total) by mouth at bedtime.  Marland Kitchen oxyCODONE-acetaminophen (PERCOCET) 5-325 MG tablet Take 1 to 2 tablets by mouth every 6 hours as need for pain  . predniSONE (DELTASONE) 10 MG tablet Take 10 mg by mouth daily with breakfast.  . umeclidinium bromide (INCRUSE ELLIPTA) 62.5 MCG/INH AEPB Inhale 1 puff into the lungs daily.  Marland Kitchen morphine (ROXANOL) 20 MG/ML concentrated solution Take 0.5 mLs (10 mg total) by mouth every 2 (two) hours as needed for moderate pain, severe pain, anxiety or shortness of breath. (Patient not taking: Reported on 04/10/2016)   No facility-administered encounter medications on file as of 04/10/2016.     Allergies  Allergen Reactions  . Morphine And  Related Nausea And Vomiting  . Penicillins Hives    Has patient had a PCN reaction causing immediate rash, facial/tongue/throat swelling, SOB or lightheadedness with hypotension: No Has patient  had a PCN reaction causing severe rash involving mucus membranes or skin necrosis: No Has patient had a PCN reaction that required hospitalization No Has patient had a PCN reaction occurring within the last 10 years: No If all of the above answers are "NO", then may proceed with Cephalosporin use.    Immunization History  Administered Date(s) Administered  . H1N1 06/30/2008  . Influenza Split 04/18/2012  . Influenza Whole 06/10/2007, 04/14/2010, 04/18/2012  . Influenza, High Dose Seasonal PF 06/02/2015, 04/10/2016  . Influenza,inj,Quad PF,36+ Mos 04/16/2013  . Pneumococcal Conjugate-13 06/02/2015  . Pneumococcal Polysaccharide-23 05/17/2006, 04/16/2013  . Td 03/29/1999  . Tdap 04/16/2013    Current Medications, Allergies, Past Medical History, Past Surgical History, Family History, and Social History were reviewed in [employer] record.   Review of Systems             All symptoms NEG except where BOLDED >>  Constitutional:  F/C/S, fatigue, anorexia, unexpected weight change. HEENT:  HA, visual changes, hearing loss, earache, nasal symptoms, sore throat, mouth sores, hoarseness. Resp:  cough, sputum, hemoptysis; SOB, tightness, wheezing. Cardio:  CP, palpit, DOE, orthopnea, edema. GI:  N/V/D/C, blood in stool; reflux, abd pain, distention, gas. GU:  dysuria, freq, urgency, hematuria, flank pain, voiding difficulty. MS:  joint pain, swelling, tenderness, decr ROM; neck pain, back pain, etc. Neuro:  HA, tremors, seizures, dizziness, syncope, weakness, numbness, gait abn. Skin:  suspicious lesions or skin rash. Heme:  adenopathy, bruising, bleeding. Psyche:  confusion, agitation, sleep disturbance, hallucinations, anxiety, depression suicidal.   Objective:    Physical Exam       Vital Signs:  Reviewed...   General:  WD, thin, 76 y/o WM in NAD; alert & oriented; pleasant & cooperative... HEENT:  [place]/AT; Conjunctiva- pink, Sclera- nonicteric, EOM-wnl, PERRLA, EACs-clear, TMs-wnl; NOSE-clear; THROAT-clear & wnl.  Neck:  Supple w/ fair ROM; no JVD; normal carotid impulses w/o bruits; no thyromegaly or nodules palpated; no lymphadenopathy.  Chest:  Decr BS bilat, clear w/o w/r/r heard... Heart:  Regular Rhythm; ?S4, gr1/6 SEM Abdomen:  Soft & nontender- no guarding or rebound; normal bowel sounds; no organomegaly or masses palpated. Ext:  decr ROM; without deformities +arthritic changes; no varicose veins, venous insuffic, no edema;  Pulses intact w/o bruits. Neuro:  No focal neuro deficits- pt in wheelchair & won't stand or walk due to LBP... Derm:  No lesions noted; no rash etc. Lymph:  No cervical, supraclavicular, axillary, or inguinal adenopathy palpated.   Assessment:      IMP>>     Severe COPD> she could not perform simple spirometry when attempted 04/2015; on NEBS w/ Albut Q6H prn but it makes her too jittery, SYMBICORT160-2spBid & not using the [facility] due to $$;  REC to use Albut 1/2vial in NEB Tid regularly followed by Symbicort160-2spBid & try INCRUSE once daily => stay on these regularly...    Chronic hypoxemic and hypercarbic resp failure> O2sat was 74% on RA 10/16 and ABGs in [place] showed pCO2=73-87 w/ resp acidosis; she is now on O2 at 2.5L/min regularly & looks much better, chest is clearer and labs improved;  Ambulatory oximetry 02/2016 w/ sats in the 90s on 1.5L after 3 Laps- much improved...    Hx pneumonia> resolved- CXR 12/2015 shows COPD & is clear, no opacity...    Hx bilat pulm nodules on CT Chest> Her CXR is much improved 01/10/16; she is due for f/u CT Chest but can't get this til she is OFF Hospice!  Ex-smoker, quit 04/2015>  congrats on not smoking!    Diastolic CHF w/ pulm edema & transudative left pleural effusion 04/2015>  on Lasix20 prn only    Hx cerebrovasc dis & stroke> this must have occurred in [place]- no data in EPIC & no prev CDopplers to review; rec to take ASA81/d & we will check CDopplers later...    MEDICAL Issues>  Hyperlipidemia (not on meds),  Hypothyroidism (on Synthroid137- oversuppressed w/ TSH=0.01 therefore decr dose to 11mg/d & recheck),  Prot-cal malnutrition (wt is up 38# & BMI=21 now),      Hx HH repair & esoph stricture> not on PPI rx at present    Hx colon polyps, divertics, hems (w/ banding), & cecal volvulus [facility] aware...    S/P TAH & BSO> aware    DJD, Back pain w/ compression fxs> she indicates that she "cracked my spine" about 47yr[age], says she was hosp x4d w/ MRI, had brace applied- no shots or vertebroplasty (this [facility] there are no XRay reports in Epic from 2012-2016;      Osteoporosis> she had BMD 01/2015 by PCP w/ TScore -4.8 in Lspine & -3.7 in Left FemNeck; placed on Ca++, VitD OTC, and FOSAMAX70/wk started; she picked up #4 tabs 02/11/15 but never refilled the med; WE STARTED VITD 50K per week & ALENDRONATE 7067mk ([id number]   Hx anxiety/ depression> on XANAX 0.5mg16m prn and REMERON 15mg9m prn...    Hx poor compliance w/ medical regimen> this was before Hospice involvement in her care (04/2015)  PLAN>>  01/10/16>   Respiratory-wise she is improved, CXR is improved, etc but she needs to continue taking O2 and meds regularly- O2 at 2L/min by [place] 24/7, NEBS w/ Albut using 1/2 vial TID every day followed by SYMBICORT160- 2spBid and new INCRUSE one inhalation daily;  XRay of Lspine shows new compression in L2, and Epic review showed that she only took Alendronate70 for 4 wks in Aug2016- she never refilled the Rx;  Discussed CXR & Lumbar films w/ Sister-in-law care giver Heidi [name]=> REC that Claude re-start Calcium/ VitD/ ALENDRONATE70/wk & stay on this (she never did); she has a back brace at home & pain meds from  Hospice, needs PT for ambulation;  We decreased her Synthroid to 112mcg78ms well... ROV planned in 6wks time. 02/23/16>   Little hTheadoratabilized from the cardiopulm standpoint & she is instructed to continue her current meds regularly (Symbicort & Incruse) plus her NEBS, VentHFA, & Lasix20 as needed;  Looks like she needs CDoppler f/u, f/u FLP, management of her thyroid medication to insure compliance w/ Rx (we cut her to 75mcg 69my), and management of her osteoporosis/ bone building therapy & her back pain due to compression fxs => note sent to her PCP... 04/10/16>   Heidi [name] continue her O2, Pred10, NEBS, Symbicort, Incruse regularly;  In addition she needs to stay on the Synthroid75mcg/d15me regularly;  And finally needs VitD50K & Alendronate70 both weekly for her bones...     Plan:     Patient's Medications  New Prescriptions                                         Vit D 50,000u one cap daily...   Previous Medications   ALBUTEROL (PROVENTIL HFA;VENTOLIN HFA) 108 (90 BASE) MCG/ACT INHALER  Inhale 2 puffs into the lungs every 6 (six) hours as needed for wheezing or shortness of breath.   ALBUTEROL (PROVENTIL) (2.5 MG/3ML) 0.083% NEBULIZER SOLUTION    Use 1/2 vial of ALBUTEROL in NEBULIZER three times daily...   ALENDRONATE (FOSAMAX) 70 MG TABLET    Take 1 tablet (70 mg total) by mouth once a week. Take with a full glass of water on an empty stomach first thing in the morning.   ALPRAZOLAM (XANAX) 0.5 MG TABLET    Take 1 tablet (0.5 mg total) by mouth 3 (three) times daily as needed for anxiety or sleep (air hunger. dyspnea).   BUDESONIDE-FORMOTEROL (SYMBICORT) 160-4.5 MCG/ACT INHALER    Inhale 2 puffs into the lungs 2 (two) times daily.   FUROSEMIDE (LASIX) 20 MG TABLET    Take 1 tablet (20 mg total) by mouth daily as needed for fluid or edema.   GABAPENTIN (NEURONTIN) 300 MG CAPSULE    Take 1 capsule (300 mg total) by mouth 2 (two) times daily.   HALOBETASOL (ULTRAVATE) 0.05 % OINTMENT     Apply topically 2 (two) times daily.     LEVOTHYROXINE (SYNTHROID) 75 MCG TABLET    Take 1 tablet (75 mcg total) by mouth daily before breakfast.   MIRTAZAPINE (REMERON) 30 MG TABLET    Take 1 tablet (30 mg total) by mouth at bedtime.   MORPHINE (ROXANOL) 20 MG/ML CONCENTRATED SOLUTION    Take 0.5 mLs (10 mg total) by mouth every 2 (two) hours as needed for moderate pain, severe pain, anxiety or shortness of breath.   OXYCODONE-ACETAMINOPHEN (PERCOCET) 5-325 MG TABLET    Take 1 to 2 tablets by mouth every 6 hours as need for pain   PREDNISONE (DELTASONE) 10 MG TABLET    Take 10 mg by mouth daily with breakfast.   UMECLIDINIUM BROMIDE (INCRUSE ELLIPTA) 62.5 MCG/INH AEPB    Inhale 1 puff into the lungs daily.  Modified Medications   No medications on file  Discontinued Medications   No medications on file

## 2016-04-12 DIAGNOSIS — Z681 Body mass index (BMI) 19 or less, adult: Secondary | ICD-10-CM | POA: Diagnosis not present

## 2016-04-12 DIAGNOSIS — Z8673 Personal history of transient ischemic attack (TIA), and cerebral infarction without residual deficits: Secondary | ICD-10-CM | POA: Diagnosis not present

## 2016-04-12 DIAGNOSIS — J441 Chronic obstructive pulmonary disease with (acute) exacerbation: Secondary | ICD-10-CM | POA: Diagnosis not present

## 2016-04-12 DIAGNOSIS — J189 Pneumonia, unspecified organism: Secondary | ICD-10-CM | POA: Diagnosis not present

## 2016-04-12 DIAGNOSIS — I509 Heart failure, unspecified: Secondary | ICD-10-CM | POA: Diagnosis not present

## 2016-04-12 DIAGNOSIS — Z8639 Personal history of other endocrine, nutritional and metabolic disease: Secondary | ICD-10-CM | POA: Diagnosis not present

## 2016-04-13 DIAGNOSIS — Z681 Body mass index (BMI) 19 or less, adult: Secondary | ICD-10-CM | POA: Diagnosis not present

## 2016-04-13 DIAGNOSIS — J441 Chronic obstructive pulmonary disease with (acute) exacerbation: Secondary | ICD-10-CM | POA: Diagnosis not present

## 2016-04-13 DIAGNOSIS — Z8639 Personal history of other endocrine, nutritional and metabolic disease: Secondary | ICD-10-CM | POA: Diagnosis not present

## 2016-04-13 DIAGNOSIS — J189 Pneumonia, unspecified organism: Secondary | ICD-10-CM | POA: Diagnosis not present

## 2016-04-13 DIAGNOSIS — I509 Heart failure, unspecified: Secondary | ICD-10-CM | POA: Diagnosis not present

## 2016-04-13 DIAGNOSIS — Z8673 Personal history of transient ischemic attack (TIA), and cerebral infarction without residual deficits: Secondary | ICD-10-CM | POA: Diagnosis not present

## 2016-04-16 DIAGNOSIS — Z8639 Personal history of other endocrine, nutritional and metabolic disease: Secondary | ICD-10-CM | POA: Diagnosis not present

## 2016-04-16 DIAGNOSIS — J189 Pneumonia, unspecified organism: Secondary | ICD-10-CM | POA: Diagnosis not present

## 2016-04-16 DIAGNOSIS — I509 Heart failure, unspecified: Secondary | ICD-10-CM | POA: Diagnosis not present

## 2016-04-16 DIAGNOSIS — Z8739 Personal history of other diseases of the musculoskeletal system and connective tissue: Secondary | ICD-10-CM | POA: Diagnosis not present

## 2016-04-16 DIAGNOSIS — J441 Chronic obstructive pulmonary disease with (acute) exacerbation: Secondary | ICD-10-CM | POA: Diagnosis not present

## 2016-04-16 DIAGNOSIS — Z8673 Personal history of transient ischemic attack (TIA), and cerebral infarction without residual deficits: Secondary | ICD-10-CM | POA: Diagnosis not present

## 2016-04-19 ENCOUNTER — Telehealth: Payer: Self-pay | Admitting: Adult Health

## 2016-04-19 DIAGNOSIS — Z8639 Personal history of other endocrine, nutritional and metabolic disease: Secondary | ICD-10-CM | POA: Diagnosis not present

## 2016-04-19 DIAGNOSIS — J189 Pneumonia, unspecified organism: Secondary | ICD-10-CM | POA: Diagnosis not present

## 2016-04-19 DIAGNOSIS — I509 Heart failure, unspecified: Secondary | ICD-10-CM | POA: Diagnosis not present

## 2016-04-19 DIAGNOSIS — Z8673 Personal history of transient ischemic attack (TIA), and cerebral infarction without residual deficits: Secondary | ICD-10-CM | POA: Diagnosis not present

## 2016-04-19 DIAGNOSIS — J441 Chronic obstructive pulmonary disease with (acute) exacerbation: Secondary | ICD-10-CM | POA: Diagnosis not present

## 2016-04-19 DIAGNOSIS — Z8739 Personal history of other diseases of the musculoskeletal system and connective tissue: Secondary | ICD-10-CM | POA: Diagnosis not present

## 2016-04-19 MED ORDER — SIMVASTATIN 10 MG PO TABS
10.0000 mg | ORAL_TABLET | Freq: Every day | ORAL | 3 refills | Status: DC
Start: 2016-04-19 — End: 2016-07-26

## 2016-04-19 NOTE — Telephone Encounter (Signed)
Spoke to caregiver, Heidi [name] and informed her of Heidi [name]'s labs  Her thyroid level is normal  She is vitamin D deficient and her cholesterol panel has increased  Heidi [name] reports that Heidi [name] is doing very well and has put on weigh, she continues to live independently  I will start Heidi [name] on a low-dose statin  Heidi [name] is going to have [place] start taking 1000 units of vitamin D daily

## 2016-04-20 DIAGNOSIS — Z8739 Personal history of other diseases of the musculoskeletal system and connective tissue: Secondary | ICD-10-CM | POA: Diagnosis not present

## 2016-04-20 DIAGNOSIS — Z8639 Personal history of other endocrine, nutritional and metabolic disease: Secondary | ICD-10-CM | POA: Diagnosis not present

## 2016-04-20 DIAGNOSIS — J441 Chronic obstructive pulmonary disease with (acute) exacerbation: Secondary | ICD-10-CM | POA: Diagnosis not present

## 2016-04-20 DIAGNOSIS — I509 Heart failure, unspecified: Secondary | ICD-10-CM | POA: Diagnosis not present

## 2016-04-20 DIAGNOSIS — Z8673 Personal history of transient ischemic attack (TIA), and cerebral infarction without residual deficits: Secondary | ICD-10-CM | POA: Diagnosis not present

## 2016-04-20 DIAGNOSIS — J189 Pneumonia, unspecified organism: Secondary | ICD-10-CM | POA: Diagnosis not present

## 2016-04-24 ENCOUNTER — Encounter: Payer: Self-pay | Admitting: Gastroenterology

## 2016-04-24 ENCOUNTER — Inpatient Hospital Stay: Admission: RE | Admit: 2016-04-24 | Payer: Medicare Other | Source: Ambulatory Visit

## 2016-04-25 DIAGNOSIS — Z8739 Personal history of other diseases of the musculoskeletal system and connective tissue: Secondary | ICD-10-CM | POA: Diagnosis not present

## 2016-04-25 DIAGNOSIS — I509 Heart failure, unspecified: Secondary | ICD-10-CM | POA: Diagnosis not present

## 2016-04-25 DIAGNOSIS — J441 Chronic obstructive pulmonary disease with (acute) exacerbation: Secondary | ICD-10-CM | POA: Diagnosis not present

## 2016-04-25 DIAGNOSIS — Z8639 Personal history of other endocrine, nutritional and metabolic disease: Secondary | ICD-10-CM | POA: Diagnosis not present

## 2016-04-25 DIAGNOSIS — Z8673 Personal history of transient ischemic attack (TIA), and cerebral infarction without residual deficits: Secondary | ICD-10-CM | POA: Diagnosis not present

## 2016-04-25 DIAGNOSIS — J189 Pneumonia, unspecified organism: Secondary | ICD-10-CM | POA: Diagnosis not present

## 2016-04-26 ENCOUNTER — Other Ambulatory Visit: Payer: Self-pay | Admitting: Pulmonary Disease

## 2016-04-26 DIAGNOSIS — J441 Chronic obstructive pulmonary disease with (acute) exacerbation: Secondary | ICD-10-CM | POA: Diagnosis not present

## 2016-04-26 DIAGNOSIS — J189 Pneumonia, unspecified organism: Secondary | ICD-10-CM | POA: Diagnosis not present

## 2016-04-26 DIAGNOSIS — Z8739 Personal history of other diseases of the musculoskeletal system and connective tissue: Secondary | ICD-10-CM | POA: Diagnosis not present

## 2016-04-26 DIAGNOSIS — Z8639 Personal history of other endocrine, nutritional and metabolic disease: Secondary | ICD-10-CM | POA: Diagnosis not present

## 2016-04-26 DIAGNOSIS — I509 Heart failure, unspecified: Secondary | ICD-10-CM | POA: Diagnosis not present

## 2016-04-26 DIAGNOSIS — Z8673 Personal history of transient ischemic attack (TIA), and cerebral infarction without residual deficits: Secondary | ICD-10-CM | POA: Diagnosis not present

## 2016-04-26 MED ORDER — VITAMIN D (ERGOCALCIFEROL) 1.25 MG (50000 UNIT) PO CAPS: 50000.0000 [IU] | ORAL_CAPSULE | ORAL | 3 refills | Status: DC

## 2016-05-03 DIAGNOSIS — Z8673 Personal history of transient ischemic attack (TIA), and cerebral infarction without residual deficits: Secondary | ICD-10-CM | POA: Diagnosis not present

## 2016-05-03 DIAGNOSIS — J441 Chronic obstructive pulmonary disease with (acute) exacerbation: Secondary | ICD-10-CM | POA: Diagnosis not present

## 2016-05-03 DIAGNOSIS — Z8639 Personal history of other endocrine, nutritional and metabolic disease: Secondary | ICD-10-CM | POA: Diagnosis not present

## 2016-05-03 DIAGNOSIS — J189 Pneumonia, unspecified organism: Secondary | ICD-10-CM | POA: Diagnosis not present

## 2016-05-03 DIAGNOSIS — I509 Heart failure, unspecified: Secondary | ICD-10-CM | POA: Diagnosis not present

## 2016-05-03 DIAGNOSIS — Z8739 Personal history of other diseases of the musculoskeletal system and connective tissue: Secondary | ICD-10-CM | POA: Diagnosis not present

## 2016-05-04 DIAGNOSIS — J441 Chronic obstructive pulmonary disease with (acute) exacerbation: Secondary | ICD-10-CM | POA: Diagnosis not present

## 2016-05-04 DIAGNOSIS — Z8739 Personal history of other diseases of the musculoskeletal system and connective tissue: Secondary | ICD-10-CM | POA: Diagnosis not present

## 2016-05-04 DIAGNOSIS — Z8639 Personal history of other endocrine, nutritional and metabolic disease: Secondary | ICD-10-CM | POA: Diagnosis not present

## 2016-05-04 DIAGNOSIS — I509 Heart failure, unspecified: Secondary | ICD-10-CM | POA: Diagnosis not present

## 2016-05-04 DIAGNOSIS — Z8673 Personal history of transient ischemic attack (TIA), and cerebral infarction without residual deficits: Secondary | ICD-10-CM | POA: Diagnosis not present

## 2016-05-04 DIAGNOSIS — J189 Pneumonia, unspecified organism: Secondary | ICD-10-CM | POA: Diagnosis not present

## 2016-05-09 DIAGNOSIS — J441 Chronic obstructive pulmonary disease with (acute) exacerbation: Secondary | ICD-10-CM | POA: Diagnosis not present

## 2016-05-09 DIAGNOSIS — Z8673 Personal history of transient ischemic attack (TIA), and cerebral infarction without residual deficits: Secondary | ICD-10-CM | POA: Diagnosis not present

## 2016-05-09 DIAGNOSIS — J189 Pneumonia, unspecified organism: Secondary | ICD-10-CM | POA: Diagnosis not present

## 2016-05-09 DIAGNOSIS — I509 Heart failure, unspecified: Secondary | ICD-10-CM | POA: Diagnosis not present

## 2016-05-09 DIAGNOSIS — Z8639 Personal history of other endocrine, nutritional and metabolic disease: Secondary | ICD-10-CM | POA: Diagnosis not present

## 2016-05-09 DIAGNOSIS — Z8739 Personal history of other diseases of the musculoskeletal system and connective tissue: Secondary | ICD-10-CM | POA: Diagnosis not present

## 2016-05-11 ENCOUNTER — Ambulatory Visit (INDEPENDENT_AMBULATORY_CARE_PROVIDER_SITE_OTHER): Payer: PRIVATE HEALTH INSURANCE | Admitting: Pulmonary Disease

## 2016-05-11 ENCOUNTER — Encounter: Payer: Self-pay | Admitting: Pulmonary Disease

## 2016-05-11 VITALS — BP 118/72 | HR 80 | Temp 98.1°F | Ht 63.0 in | Wt 123.5 lb

## 2016-05-11 DIAGNOSIS — G8929 Other chronic pain: Secondary | ICD-10-CM

## 2016-05-11 DIAGNOSIS — I5032 Chronic diastolic (congestive) heart failure: Secondary | ICD-10-CM | POA: Diagnosis not present

## 2016-05-11 DIAGNOSIS — J449 Chronic obstructive pulmonary disease, unspecified: Secondary | ICD-10-CM

## 2016-05-11 DIAGNOSIS — J9611 Chronic respiratory failure with hypoxia: Secondary | ICD-10-CM | POA: Diagnosis not present

## 2016-05-11 DIAGNOSIS — M545 Low back pain: Secondary | ICD-10-CM

## 2016-05-11 DIAGNOSIS — J441 Chronic obstructive pulmonary disease with (acute) exacerbation: Secondary | ICD-10-CM | POA: Diagnosis not present

## 2016-05-11 DIAGNOSIS — Z8639 Personal history of other endocrine, nutritional and metabolic disease: Secondary | ICD-10-CM | POA: Diagnosis not present

## 2016-05-11 DIAGNOSIS — R938 Abnormal findings on diagnostic imaging of other specified body structures: Secondary | ICD-10-CM | POA: Diagnosis not present

## 2016-05-11 DIAGNOSIS — Z8673 Personal history of transient ischemic attack (TIA), and cerebral infarction without residual deficits: Secondary | ICD-10-CM | POA: Diagnosis not present

## 2016-05-11 DIAGNOSIS — I509 Heart failure, unspecified: Secondary | ICD-10-CM | POA: Diagnosis not present

## 2016-05-11 DIAGNOSIS — Z8739 Personal history of other diseases of the musculoskeletal system and connective tissue: Secondary | ICD-10-CM | POA: Diagnosis not present

## 2016-05-11 DIAGNOSIS — J189 Pneumonia, unspecified organism: Secondary | ICD-10-CM | POA: Diagnosis not present

## 2016-05-11 DIAGNOSIS — E039 Hypothyroidism, unspecified: Secondary | ICD-10-CM

## 2016-05-11 DIAGNOSIS — Z515 Encounter for palliative care: Secondary | ICD-10-CM

## 2016-05-11 DIAGNOSIS — R9389 Abnormal findings on diagnostic imaging of other specified body structures: Secondary | ICD-10-CM

## 2016-05-11 DIAGNOSIS — F411 Generalized anxiety disorder: Secondary | ICD-10-CM | POA: Insufficient documentation

## 2016-05-11 MED ORDER — METHYLPREDNISOLONE 8 MG PO TABS: ORAL_TABLET | ORAL | 0 refills | Status: DC

## 2016-05-11 NOTE — Progress Notes (Signed)
Subjective:     Patient ID: Heidi [name], female   DOB: 09/27/1940, 75 y.o.   MRN: [id number]  HPI 75 y/o WF former smoker who quit 2016 w/ severe COPD/emphysema & chronic hypoxemic and hypercarbic resp failure, placed on Hospice by DrSwords/ DrBurchette in 2016, hx pneumonia and small bilat pulm nodules on CT Chest, and diastolic CHF...   ~  May 13, 2015:  Initial pulmonary consult by SN>        Heidi [name] was Hosp 10/3 - 04/27/15 by [facility] after presenting via EMS w/ cough, weakness, increased SOB; she was still smoking, not on home O2; found to have COPD exac & CAP w/ bibasilar opacities, acute on chronic hypoxemic & hypercarbic resp failure, & superimposed CHF w/ bilat L>R pleural effusions (this was tapped 10/9 yielding transudative fluid, neg cytology, neg culture)... She was seen by CCM- both MR & JN attended her in the hosp; Treated w/ Rocephin/ Zithromax, Solumedrol, Nebs, & brief stay in step-down unit on BiPap; disch on ADVAIR100-1spBid, SPIRIVA daily, PRED taper, NEBS w/ Albut... She was also treated for acute on chronic diastolic CHF- mod dil RV & reduced RV sys function (w/ preserved LVF) on 2DEcho & diuresed w/ Lasix but only disch on 20mg prn for edema... Hospice was consulted (DrGolding's note reviewed) and she has entered the Hospice program (she is a DNR, DNI) w/ a prognosis est <6mo & she was disch on ROXANOL 20mg/ml--take 1/2 ml Q2H as needed for pain, SOB, anxiety...  She last saw DrSwords 11/2013, she has established new PCP- [name] & DrBurchette- has post hosp f/u pending; Note> she cancelled 2 appts w/ DrMcQuaid recently...       Currently Heidi [name] is very stoic- states she's not having any breathing problems at the present time & credits the thoracentesis w/ her remarkable improvement; her last cigarette was 04/19/15 prior to her admission; she was disch on O2 at 4L/min & has an O2sat=89% on this in the office today (RA sat was 74%); she was unable to perform PFT today; we had  a frank discussion about her severe, end-stage COPD...       Epic records indicate that she was seen by DrWert in 2011> on Spiriva +/- Advair, stated she quit smoking 2011 on adm for COPD exac, PFT reported FEV1=1.32 (77%), FEV1/FVC ratio=50%, DLCO=70%; CXR showed COPD & biapical pleuroparenchymal scarring, CT Chest 4/11 showed ?mult pulm nodules ?early cavitation r/o MAI, cultures- only NTF, O2sat=95% on RA...  Medical Hx>   She had an annual wellness exam from her PCP 02/2015> COPD, Hx pulm nodule, Hx of pneumonia; Hypothyroidism; adenomatous colonic polyps; Hyperlipidemia; Sigmoid volvulus; Depression; Cerebrovascular disease; Positive ANA; Diverticulosis; Esophageal stricture; Arthritis; History of stroke; Internal hemorrhoids; and Rectal ulcer; protein-calorie malnutrition (she weighs 78 lbs...  EXAM shows Afeb, VSS, O2sat=89% on O2 at 4L/min; Wt=78#, 5'2"Tall, BMI=16;  Heent- neg, mallampati1;  Chest- decr BS bilat, scat rhonchi, & end-exp wheezing;  Heart- RR gr1/6 SEM w/o r/g;  Abd soft, neg;  Ext- w/o c/c/ tr edema...   Baseline CXR 12/31/14 showed hyperinflation c/w COPD, biapical pleuroparenchymal scarring, nipple shadows and T12 compression fx...  CT Chest 02/22/15 showed cardiomeg, atherosclerotic changes in Ao & coronaries, small right effusion; emhysema w/ several additional findings- RUL & lingular subpleural opacities, & 5mm LUL & RLL nodules; osteopenia and T7 +T12 compression fractures...   CXRs performed during the 04/2015 Admission showed cardiomeg, CHF w/ left effusion, COPD w/ interstitial edema & LLL opac => improved post thoracentesis   10/9 w/ 400cc removed...  ABGs during the 10/16 Hosp showed pCO2 in the 73-87 range w/ resp acidosis...   LABS 10/16 reviewed> CBC- ok w/ Hg=13 range;  Chems- note HCO3=44-45 range & she may benefit drom Acetazolamide added to her diuretic regimen if more aggressive treatment is desired...  IMP>> Heidi [name] has severe COPD w/ acute on chronic hypoxemic &  hypercarbic resp failure & cor pulmonale; her last cigarette was the day of admission 04/19/15... She also has a hx of poor compliance w/ med rx & she has once again stopped the prescribed meds perceiving that she is much better since the thoracentesis; she was referred to Hospice for outpt follow up & will continue to see her PCPs at [facility]; I told her I would be happy to recheck at any time if she so desired...  NOTE> since her disch she states that her breathing has been "so much better" that she hasn't needed her inhalers or the nebulizer, and she's off the Pred; she states she is taking the ASA81, Lasix20, Synthroid137, Xanax, and Remeron; she apparently hasn't needed the Roxanol yet...  We had a frank discussion about her end-stage disease & we "proved it to her" by checking her O2sat off the oxygen (74%), and attempting to get a Spirometry tracing (she couldn't do it);  Pt is asked to get back on her meds & we outlined a regimen>  NEBS w/ Albut Qid (breakfast, lunch, dinner, & bedtime);  ADVAIR100- 2spBid (taken after the Neb at breakfast & dinner);  SPIRIVA via handihaler once daily (taken after the Neb at lunch)...    ~  January 10, 2016:  60moROV w/ SN>  NOTE: family has moved her into her own apt 12/2015) next to her Sister-in-law/ care giver's apt      [name] for a follow up visit- she has severe COPD/emphysema w/ chronic hypoxemic and hypercarbic resp failure; [facility]; she has been followed by NP-CoryNafziger since then;  She returns today w/ her sister-in-law care giver [name]#[phone] that Hospice is planning to stop services soon but pt is c/o severe LBP x 6wks ever since she lifted a chlorox bottle & felt a "pop" in her back; she has mentioned this to Hospice but all they have done is to adjust her pain meds (Percocet5-325 & Roxanol 283mml- 0.71m70m2H prn) without further eval;  She states that her breathing  is OK- meaning she could get about in her apt before the episode of acute LBP 2wks ago;I note that she has not had any lab work or f/u CXR since [facility] & she is clearly in need of same- she has a hx of compression fx of spine and osteoporosis but not on any bone building therapy... We reviewed the following medical problems during today's office visit >>     Severe COPD> she could not perform simple spirometry when attempted 04/2015; on NEBS w/ Albut Q6H prn but it makes her too jittery, SYMBICORT160-2spBid & [facility];  REC to use Albut 1/2vial in NEB Tid regularly followed by Symbicort160-2spBid & try INCRUSE once daily => we plan ROV in 6wks w/ repeat spirometry & oximetry if she can walk...     Chronic hypoxemic and hypercarbic resp failure> O2sat was 74% on RA 10/16 and [place] p[place]2=73-87 w/ resp acidosis; she is now on O2 at 2.5L/min regularly &  looks much better, chest is clearer and labs improved; we will check ambulatory oximetry on ret if able to walk for [place]...    Hx pneumonia> resolved- CXR 12/2015 shows COPD & is clear, no opacity...    Hx bilat pulm nodules on CT Chest> Her CXR is much improved 01/10/16; she will be due for a f/u CT Chest on return 02/2016...    Ex-smoker, quit 04/2015>  congrats on not smoking!    Diastolic CHF w/ pulm edema & transudative left pleural effusion 04/2015> on Lasix20 prn only    Hx cerebrovasc dis & stroke> this must have occurred in [place]- no data in EPIC & no prev CDopplers to review; rec to take ASA81/d & we will check CDopplers later...    MEDICAL Issues>  Hyperlipidemia (not on meds),  Hypothyroidism (on Synthroid137/d),  Prot-cal malnutrition (wt is up 38# & BMI=21 now),      Hx HH repair & esoph stricture> not on PPI rx at present    Hx colon polyps, divertics, hems (w/ banding), & cecal volvulus s/p right hemicolectomy at [facility] 2012> aware...    S/P TAH & BSO> aware    DJD, Back pain w/  compression fxs> she indicates that she "cracked my spine" about 67yr ago, says she was hosp x4d w/ MRI, had brace applied- no shots or vertebroplasty ([facility] are [facility] reports in Epic from 2012-2016;      Osteoporosis> she had BMD 01/2015 by PCP w/ TScore -4.8 in Lspine & -3.7 in Left FemNeck; placed on Ca++, VitD OTC, and FOSAMAX70/wk started; she picked up #4 tabs 02/11/15 but never refilled the med...    Hx anxiety/ depression> on XANAX 0.571mid prn and REMERON 1559mhs prn...    Hx poor compliance w/ medical regimen> this was before Hospice involvement in her care (04/2015) EXAM shows Afeb, VSS, O2sat=97% on O2 at 2.5L/min; Wt was 116# in [id number] 38# to BMI=21);  Heent- neg, mallampati1;  Chest- decr BS bilat w/o w/r/r;  Heart- RR gr1/6 SEM w/o r/g;  Abd soft, neg;  Ext- w/o c/c/e in wheelchair & won't stand/walk due to LBP...  CXR 01/10/16>  Much improved- some hyperinflation, bilat apical pleural thickening & bibasilar scarring, no effusion/ edema/ consolidation/ adenopathy, stable ant wedging of T12 w/ kyphosis...   Lumbar spine XRay 01/10/16>  Anterior wedging of L2 is new, old fx of T12, no other fxs & no spondylolithesis- disc sp narrowing L2-3 & L3-4, facet arthritis L5-S1, calcif in abd Ao...  LABS 12/2015>  Chems- wnl, HCO3=32, renal=wnl;  CBC- wnl;  TSH=0.01 on synthroid137;  BNP=70;  Sede=3 IMP/PLAN>>  Respiratory-wise she is improved, CXR is improved, etc but she needs to continue taking O2 and meds regularly- O2 at 2L/min by [place] 24/7, NEBS w/ Albut using 1/2 vial TID every day followed by SYMBICORT160- 2spBid and new INCRUSE one inhalation daily;  XRay of Lspine shows new compression in L2, and Epic review showed that she only took Alendronate70 for 4 wks in Aug2016- she never refilled the Rx;  Discussed CXR & Lumbar films w/ Sister-in-law care giver Nancy=> REC that Leanza re-start Calcium/ VitD/ ALENDRONATE70/wk & stay on this; she has a back  brace at home & pain meds from Hospice, needs PT for ambulation;  Second issue is her oversuppressed TSH on Synth137=> REC decr dose to 112 & recheck TSH on return... ROV planned in 6wks time.  ~  February 23, 2016:  6wk ROV & f/u w/ SN>  [name] has done remarkably well from the pulm standpoint- stabilized and improved suffic to be able to move into her own appt & care for herself w/ family support nearby; she says she feels like she's getting better too; she was working w/ PT at home but she thinks they were working her too hard & she sent them away- I explained that this therapy is the MOST IMPORTANT thing for her continued recovery!  She tells me that this is the last month of her yr on Hospice, she has Roxanol on her med list but thank goodness she hasn't needed it!  She is in a wheelchair in the office today- but tells me she is up & about at home=> we will walk her today & check O2sats...    1) Severe COPD> she could not perform simple spirometry when attempted 04/2015; on NEBS w/ Albut Q6H prn & using 1/2 vial 2-3x per wk, SYMBICORT160-2spBid & Incruse once daily; Hx medication noncompliance & not using the NEBS Tid as requested!     2) Chronic hypoxemic and hypercarbic resp failure> O2sat was 74% on RA 10/16 and ABGs in Hosp showed pCO2=73-87 w/ resp acidosis; she is now on O2 at 2.5L/min regularly & looks much better, chest is clearer and labs improved; ambulatory oximetry 02/2016 showed no desaturation on 2.5L/min...     3) Hx pneumonia> resolved- CXR 12/2015 shows COPD & clear, no opacity...    4) Hx bilat pulm nodules on CT Chest> Her CXR is much improved 01/10/16; she will be due for a f/u CT Chest to check these nodules soon...    5) Ex-smoker, quit 04/2015>  congrats on not smoking!    6) Diastolic CHF w/ pulm edema & transudative left pleural effusion 04/2015> on Lasix20 prn only now    7) Hx cerebrovasc dis & stroke> this must have occurred in [place]- no data in EPIC & no prev CDopplers to  review; rec to take ASA81/d & she will need CDopplers from her PCP on ret...    8) MEDICAL Issues>  Hyperlipidemia (not on meds),  Hypothyroidism (on Synthroid112/d),  Prot-cal malnutrition (wt is up to 114# & BMI=20 now); I reviewed all her TSH readings and called her Pharm ([facility] in [place]) & they confirmed poor med compliance in past- eery time her dose was increased she was NOT taking her meds regularly!  She is now on 112mcg/d and pill counts are accurate- TSH=0.02 so this dose is still too high for her; WE WILL DECR DOSE TO 75mcg/d and turn over follow up of this problem to her PCP- DrCory & DrBurchette...    9) Hx HH repair & esoph stricture> not on PPI rx at present    10) Hx colon polyps, divertics, hems (w/ banding), & cecal volvulus s/p right hemicolectomy at [place] 2012> aware...    11) S/P TAH & BSO> aware    12) DJD, Back pain w/ compression fxs> she indicates that she "cracked my spine" about 4yrs ago, says she was hosp x4d w/ MRI, had brace applied- no shots or vertebroplasty (this must have been done at [place] [facility] as there are no XRay reports in Epic from 2012-2016; she was taking Roxanol for pain but will be leaving hospice soon & her PCP will assume care of her osteoporosis, back pain=> pain meds and bone building therapy.    13) Osteoporosis> she had BMD 01/2015 by PCP w/ TScore -4.8 in Lspine & -3.7 in   Left FemNeck; placed on Ca++, VitD OTC, and FOSAMAX70/wk started; she picked up #4 tabs 02/11/15 but never refilled the med...    14) Hx anxiety/ depression> on XANAX 0.72mTid prn and REMERON 14mQhs prn...    15) Hx poor compliance w/ medical regimen> this was before Hospice involvement in her care (04/2015) EXAM shows Afeb, VSS, O2sat=97% on O2 at 1.5L/min; Wt = 114#, BMI=21;  Heent- neg, mallampati1;  Chest- decr BS bilat w/o w/r/r;  Heart- RR gr1/6 SEM w/o r/g;  Abd soft, neg;  Ext- w/o c/c/e in wheelchair today...  Ambulatory oximetry 02/23/16>  O2sat=94% on 1.5L/min at rest  w/ pulse=82/min;  She ambulated 3 Laps pushing her wheelchair w/ lowest O2sat=91% w/ HR=110/min...  LABS 02/23/16 showed TSH=0.02 on [place] 11226md taken regularly ([facility] IMP/PLAN>>  JudSailors stabilized from the cardiopulm standpoint & she is instructed to continue her current meds regularly Symbicort & Incruse) plus her NEBS, VentHFA, & Lasix20 as needed;  Looks like she needs CDoppler f/u, f/u FLP, management of her thyroid medication to insure compliance w/ Rx, and management of her osteoporosis/ bone building therapy & her back pain due to compression fxs => note sent to her PCP for all these...  ~  April 10, 2016:  6wk ROV w/ SN>  JudChrislynnturns for an add-on visit after [place];  She was coughing up green mucus, called Hospice 7 they did a portable at home CXR that is said to be OK, clear, and treated her w/ Prednisone, "It was just a virus" she says, now improved... She is actually doing very well w/o acute resp symptoms and she has remained on her Pred10, NEBS w/ Albut- 1/2 vial Tid, Symbicort160-2Bid, Incruse once daily... She is c/o her port O2 system-- has difficulty filling the tanks, too heavy for her, she's "scared of it"=> needs POC (wants simply-Go mini) & [facility] [place] [facility]... She is also due for her follow up CT Chest to compare to 02/2015...    Since she was here last she had a f/u visit w/ PCP- CorDorothyann Peng Bra[place]u depression- improved on Remeron for sleep, LBP- hospice XRays reported neg but showed L2 & T12 compressions- given Medrol dosepak & Salonpas patches... He has since reviewed my last OV note w/ requests for [facility] f/u for her medical issues- hypothyroid w/oversuppressed TSH on Synthroid (we cut her down to 14m59m), osteoporosis & compression fxs, due for f/u CDopplers...  SEE PROB LIST ABOVE>>     EXAM shows Afeb, VSS, O2sat=95% on O2  at ?3L/min; Wt = 120#, BMI=22;  Heent- neg, mallampati1;  Chest- decr BS bilat w/o w/r/r;  Heart- RR gr1/6 SEM w/o r/g;  Abd soft, neg;  Ext- w/o c/c/e;  Neuro- weak, no focal deficits...  LABS 04/10/16 per Cory> TSH=0.67 on Synthroid75- continue same;  VitD=16 & needs supplement w/ 50K weekly... IMP/PLAN>>  COPD/ chr resp failure stable on current regimen- continue same;  Thyroid is improved on the 14mc56mtaken regularly- continue same;  She needs VitD and bone building therapy=> start VitD50K per week and ALENDRONATE 70mg 45mper week- must get these filled regularly & take them weekly going forward without fail...  ~  May 11, 2016:  77mo RO7moadd-on appt requested for dyspnea> Lenea reCorreens for an add-on visit "because I couldn't get thru to DrCory" c/o incr SOB, hard to cough  up any phlegm, denies f/c/s, c/o no appetite, shakey/ snappy/ agitated and incr SOB w/ activity & ADLs, feels like she can't get a deep breath, "anxious all the time"- says all this occurred when PRED weaned to 1/2 tab daily & she's been off x 2wks now; Hospice came out to her house & did a CXR- reported to be clear/ NAD (not avail for review, last CXR in Epic 01/10/16=> COPD, NAD, kyphosis & compression T12;...     EXAM shows Afeb, VSS, O2sat=96% on O2 at 3L/min; Wt = 124#, BMI=22;  HEENT- neg, mallampati1;  Chest- decr BS bilat w/o w/r/r;  Heart- RR gr1/6 SEM w/o r/g;  Abd soft, neg;  Ext- w/o c/c/e;  Neuro- weak, no focal deficits... IMP/PLAN>>  Eliane has been using her NEBULIZER just prn & averaging once qod or so- we discussed a regular treatment regimen that includes using her NEB w/ 1/2 vial of Albut Tid regularly followed by her Symbicort160-2spBid & the Incruse once daily;  We will restart a systemic steroid med- try MEDROL 41m tabs (one Bid for 10d then down to 1Qam til return);  In addition she will increase her Alprazolam to 1 tab Tid regularly (she's been on 1Bid but still anxious);  Keep other meds the same including  Alendronate70 once per week, Vit D 50K once per week, and Synthroid75 daily 1st thing in the Am;  We plan ROV recheck in 3wks time...    Past Medical History:  Diagnosis Date  . Arthritis   . Cerebrovascular disease   . Chronic diastolic CHF (congestive heart failure) ([place] 02/23/[place]  . COPD ([place] obstructive pulmonary disease) ([place]   . Depression   . Diverticulosis   . Esophageal stricture   . History of pneumonia   . History of stroke    mild oncoming  . 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 Hyperlipidemia   . Hypothyroidism   . Internal hemorrhoids   . Osteoporosis   . Pneumonia    HX of  . Positive ANA (antinuclear antibody)   . Pulmonary nodule   . Rectal ulcer   . Shortness of breath    periodically with COPD flair up  . 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([facility]     Past Surgical History:  Procedure Laterality Date  . BLADDER SURGERY     tack  . CATARACT EXTRACTION     bilateral  . COLONOSCOPY  06/22/2011   Procedure: COLONOSCOPY;  Surgeon: DLafayette Dragon MD;  Location: [facility] ENDOSCOPY;  Service: Endoscopy;  Laterality: N/A;  . GASTRIC VARICES BANDING  06/22/2011   Procedure: HEMORRHOID BANDING;  Surgeon: DLafayette Dragon MD;  Location: [facility] ENDOSCOPY;  Service: Endoscopy;  Laterality: N/A;  . HEMICOLECTOMY  2012   [facility]  . HIATAL HERNIA REPAIR     x 2  . TONSILLECTOMY    . TOTAL ABDOMINAL HYSTERECTOMY W/ BILATERAL SALPINGOOPHORECTOMY  1998    Outpatient Encounter Prescriptions as of 05/11/2016  Medication Sig  . albuterol (PROVENTIL HFA;VENTOLIN HFA) 108 (90 BASE) MCG/ACT inhaler Inhale 2 puffs into the lungs every 6 (six) hours as needed for wheezing or shortness of breath.  .Marland Kitchenalbuterol (PROVENTIL) (2.5 MG/3ML) 0.083% nebulizer solution Use 1/2 vial of ALBUTEROL in NEBULIZER three times daily...  . alendronate (FOSAMAX) 70 MG tablet Take 1 tablet (70 mg total) by mouth once a week. Take with a full glass of water on an empty  stomach first thing in the morning.  .Marland Kitchen  ALPRAZolam (XANAX) 0.5 MG tablet Take 1 tablet (0.5 mg total) by mouth 3 (three) times daily as needed for anxiety or sleep (air hunger. dyspnea).  . budesonide-formoterol (SYMBICORT) 160-4.5 MCG/ACT inhaler Inhale 2 puffs into the lungs 2 (two) times daily.  . furosemide (LASIX) 20 MG tablet Take 1 tablet (20 mg total) by mouth daily as needed for fluid or edema.  . gabapentin (NEURONTIN) 300 MG capsule Take 1 capsule (300 mg total) by mouth 2 (two) times daily.  . halobetasol (ULTRAVATE) 0.05 % ointment Apply topically 2 (two) times daily.    Marland Kitchen levothyroxine (SYNTHROID) 75 MCG tablet Take 1 tablet (75 mcg total) by mouth daily before breakfast.  . mirtazapine (REMERON) 30 MG tablet Take 1 tablet (30 mg total) by mouth at bedtime.  Marland Kitchen oxyCODONE-acetaminophen (PERCOCET) 5-325 MG tablet Take 1 to 2 tablets by mouth every 6 hours as need for pain  . simvastatin (ZOCOR) 10 MG tablet Take 1 tablet (10 mg total) by mouth at bedtime.  Marland Kitchen umeclidinium bromide (INCRUSE ELLIPTA) 62.5 MCG/INH AEPB Inhale 1 puff into the lungs daily.  . Vitamin D, Ergocalciferol, (DRISDOL) 50000 units CAPS capsule Take 1 capsule (50,000 Units total) by mouth every 7 (seven) days.  Marland Kitchen morphine (ROXANOL) 20 MG/ML concentrated solution Take 0.5 mLs (10 mg total) by mouth every 2 (two) hours as needed for moderate pain, severe pain, anxiety or shortness of breath. (Patient not taking: Reported on 05/11/2016)  . [DISCONTINUED] predniSONE (DELTASONE) 10 MG tablet Take 10 mg by mouth daily with breakfast.   No facility-administered encounter medications on file as of 05/11/2016.     Allergies  Allergen Reactions  . Morphine And Related Nausea And Vomiting  . Penicillins Hives    Has patient had a PCN reaction causing immediate rash, facial/tongue/throat swelling, SOB or lightheadedness with hypotension: No Has patient had a PCN reaction causing severe rash involving mucus membranes or skin  necrosis: No Has patient had a PCN reaction that required hospitalization No Has patient had a PCN reaction occurring within the last 10 years: No If all of the above answers are "NO", then may proceed with Cephalosporin use.    Immunization History  Administered Date(s) Administered  . H1N1 06/30/2008  . Influenza Split 04/18/2012  . Influenza Whole 06/10/2007, 04/14/2010, 04/18/2012  . Influenza, High Dose Seasonal PF 06/02/2015, 04/10/2016  . Influenza,inj,Quad PF,36+ Mos 04/16/2013  . Pneumococcal Conjugate-13 06/02/2015  . Pneumococcal Polysaccharide-23 05/17/2006, 04/16/2013  . Td 03/29/1999  . Tdap 04/16/2013    Current Medications, Allergies, Past Medical History, Past Surgical History, Family History, and Social History were reviewed in [employer] record.   Review of Systems             All symptoms NEG except where BOLDED >>  Constitutional:  F/C/S, fatigue, anorexia, unexpected weight change. HEENT:  HA, visual changes, hearing loss, earache, nasal symptoms, sore throat, mouth sores, hoarseness. Resp:  cough, sputum, hemoptysis; SOB, tightness, wheezing. Cardio:  CP, palpit, DOE, orthopnea, edema. GI:  N/V/D/C, blood in stool; reflux, abd pain, distention, gas. GU:  dysuria, freq, urgency, hematuria, flank pain, voiding difficulty. MS:  joint pain, swelling, tenderness, decr ROM; neck pain, back pain, etc. Neuro:  HA, tremors, seizures, dizziness, syncope, weakness, numbness, gait abn. Skin:  suspicious lesions or skin rash. Heme:  adenopathy, bruising, bleeding. Psyche:  confusion, agitation, sleep disturbance, hallucinations, anxiety, depression suicidal.   Objective:   Physical Exam       Vital Signs:  Reviewed.Marland KitchenMarland Kitchen  General:  WD, thin, 74 y/o WM in NAD; alert & oriented; pleasant & cooperative... HEENT:  [place]/AT; Conjunctiva- pink, Sclera- nonicteric, EOM-wnl, PERRLA, EACs-clear, TMs-wnl; NOSE-clear; THROAT-clear & wnl.  Neck:  Supple w/  fair ROM; no JVD; normal carotid impulses w/o bruits; no thyromegaly or nodules palpated; no lymphadenopathy.  Chest:  Decr BS bilat, clear w/o w/r/r heard... Heart:  Regular Rhythm; ?S4, gr1/6 SEM Abdomen:  Soft & nontender- no guarding or rebound; normal bowel sounds; no organomegaly or masses palpated. Ext:  decr ROM; without deformities +arthritic changes; no varicose veins, venous insuffic, no edema;  Pulses intact w/o bruits. Neuro:  No focal neuro deficits- pt in wheelchair & won't stand or walk due to LBP... Derm:  No lesions noted; no rash etc. Lymph:  No cervical, supraclavicular, axillary, or inguinal adenopathy palpated.   Assessment:      IMP>>     Severe COPD> she could not perform simple spirometry when attempted 04/2015; on NEBS w/ Albut Q6H prn but it makes her too jittery, SYMBICORT160-2spBid & not using the [facility] due to $$;  REC to use Albut 1/2vial in NEB Tid regularly followed by Symbicort160-2spBid & try INCRUSE once daily => stay on these regularly...    Chronic hypoxemic and hypercarbic resp failure> O2sat was 74% on RA 10/16 and ABGs in [place] showed pCO2=73-87 w/ resp acidosis; she is now on O2 at 2.5L/min regularly & looks much better, chest is clearer and labs improved;  Ambulatory oximetry 02/2016 w/ sats in the 90s on 1.5L after 3 Laps- much improved...    Hx pneumonia> resolved- CXR 12/2015 shows COPD & is clear, no opacity...    Hx bilat pulm nodules on CT Chest> Her CXR is much improved 01/10/16; she is due for f/u CT Chest but can't get this til she is OFF Hospice!    Ex-smoker, quit 04/2015>  congrats on not smoking!    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 aware...    S/P TAH & BSO> aware    DJD, Back pain w/ compression fxs> she indicates that she "cracked my spine" about 455yrago, says she was hosp x4d w/ MRI, had brace applied- no shots or vertebroplasty (this [facility] there are no XRay reports in Epic from 2012-2016;      Osteoporosis> she had BMD 01/2015 by PCP w/ TScore -4.8 in Lspine & -3.7 in Left FemNeck; placed on Ca++, VitD OTC, and FOSAMAX70/wk started; she picked up #4 tabs 02/11/15 but never refilled the med; WE STARTED VITD 50K per week & ALENDRONATE 7096mk ([id number]   Hx anxiety/ depression> on XANAX 0.5mg85m prn and REMERON 15mg26m prn...    Hx poor compliance w/ medical regimen> this was before Hospice involvement in her care (04/2015)  PLAN>>  01/10/16>   Respiratory-wise she  is improved, CXR is improved, etc but she needs to continue taking O2 and meds regularly- O2 at 2L/min by [place] 24/7, NEBS w/ Albut using 1/2 vial TID every day followed by SYMBICORT160- 2spBid and new INCRUSE one inhalation daily;  XRay of Lspine shows new compression in L2, and Epic review showed that she only took Alendronate70 for 4 wks in Aug2016- she never refilled the Rx;  Discussed CXR & Lumbar films w/ Sister-in-law care giver Nancy=> REC that Bassheva re-start Calcium/ VitD/ ALENDRONATE70/wk & stay on this (she never did); she has a back brace at home & pain meds from Hospice, needs PT for ambulation;  We decreased her Synthroid to 114mg/d as well... ROV planned in 6wks time. 02/23/16>   JTrinettehas stabilized from the cardiopulm standpoint & she is instructed to continue her current meds regularly (Symbicort & Incruse) plus her NEBS, VentHFA, &  Lasix20 as needed;  Looks like she needs CDoppler f/u, f/u FLP, management of her thyroid medication to insure compliance w/ Rx (we cut her to 787m today), and management of her osteoporosis/ bone building therapy & her back pain due to compression fxs => note sent to her PCP... 04/10/16>   JuChanelleeds to continue her O2, Pred10, NEBS, Symbicort, Incruse regularly;  In addition she needs to stay on the Synthroid753md dose regularly;  And finally needs VitD50K & Alendronate70 both weekly for her bones... 05/11/16>   JudNiralis been using her NEBULIZER just prn & averaging once qod or so- we discussed a regular treatment regimen that includes using her NEB w/ 1/2 vial of Albut Tid regularly followed by her Symbicort160-2spBid & the Incruse once daily;  We will restart a systemic steroid med- try MEDROL 8mg34mbs (one Bid for 10d then down to 1Qam til return);  In addition she will increase her Alprazolam to 1 tab Tid regularly (she's been on 1Bid but still anxious);  Keep other meds the same including Alendronate70 once per week, Vit D 50K once per week, and Synthroid75 daily 1st thing in the Am;  We plan ROV recheck in 3wks time...     Plan:     Patient's Medications  New Prescriptions   METHYLPREDNISOLONE (MEDROL) 8 MG TABLET    Take 1 tablet two times daily x 10 days then 1 tablet daily until return  Previous Medications   ALBUTEROL (PROVENTIL HFA;VENTOLIN HFA) 108 (90 BASE) MCG/ACT INHALER    Inhale 2 puffs into the lungs every 6 (six) hours as needed for wheezing or shortness of breath.   ALBUTEROL (PROVENTIL) (2.5 MG/3ML) 0.083% NEBULIZER SOLUTION    Use 1/2 vial of ALBUTEROL in NEBULIZER three times daily...   ALENDRONATE (FOSAMAX) 70 MG TABLET    Take 1 tablet (70 mg total) by mouth once a week. Take with a full glass of water on an empty stomach first thing in the morning.   ALPRAZOLAM (XANAX) 0.5 MG TABLET    Take 1 tablet (0.5 mg total) by mouth 3 (three) times daily as needed for anxiety or  sleep (air hunger. dyspnea).   BUDESONIDE-FORMOTEROL (SYMBICORT) 160-4.5 MCG/ACT INHALER    Inhale 2 puffs into the lungs 2 (two) times daily.   FUROSEMIDE (LASIX) 20 MG TABLET    Take 1 tablet (20 mg total) by mouth daily as needed for fluid or edema.   GABAPENTIN (NEURONTIN) 300 MG CAPSULE    Take 1 capsule (300 mg total) by mouth 2 (two) times daily.   HALOBETASOL (ULTRAVATE) 0.05 % OINTMENT  Apply topically 2 (two) times daily.     LEVOTHYROXINE (SYNTHROID) 75 MCG TABLET    Take 1 tablet (75 mcg total) by mouth daily before breakfast.   MIRTAZAPINE (REMERON) 30 MG TABLET    Take 1 tablet (30 mg total) by mouth at bedtime.   MORPHINE (ROXANOL) 20 MG/ML CONCENTRATED SOLUTION    Take 0.5 mLs (10 mg total) by mouth every 2 (two) hours as needed for moderate pain, severe pain, anxiety or shortness of breath.   OXYCODONE-ACETAMINOPHEN (PERCOCET) 5-325 MG TABLET    Take 1 to 2 tablets by mouth every 6 hours as need for pain   SIMVASTATIN (ZOCOR) 10 MG TABLET    Take 1 tablet (10 mg total) by mouth at bedtime.   UMECLIDINIUM BROMIDE (INCRUSE ELLIPTA) 62.5 MCG/INH AEPB    Inhale 1 puff into the lungs daily.   VITAMIN D, ERGOCALCIFEROL, (DRISDOL) 50000 UNITS CAPS CAPSULE    Take 1 capsule (50,000 Units total) by mouth every 7 (seven) days.  Modified Medications   No medications on file  Discontinued Medications   PREDNISONE (DELTASONE) 10 MG TABLET    Take 10 mg by mouth daily with breakfast.

## 2016-05-11 NOTE — Patient Instructions (Signed)
Today we updated your med list in our EPIC system...    Continue your current medications the same...  We discussed increasing the ALPRAZOLAM (Xanax) to one tab three times daily...  Add-in the new MEDROL 8mg  tabs-     Start w/ one tab twice daily for 10days...    Then decrease to one tab each AM until return visit in about 3 weeks...  Call for any questions...  Let's plan a follow up visit in 3wks time, sooner if needed for problems.Marland KitchenMarland Kitchen

## 2016-05-15 DIAGNOSIS — Z8739 Personal history of other diseases of the musculoskeletal system and connective tissue: Secondary | ICD-10-CM | POA: Diagnosis not present

## 2016-05-15 DIAGNOSIS — Z8673 Personal history of transient ischemic attack (TIA), and cerebral infarction without residual deficits: Secondary | ICD-10-CM | POA: Diagnosis not present

## 2016-05-15 DIAGNOSIS — J441 Chronic obstructive pulmonary disease with (acute) exacerbation: Secondary | ICD-10-CM | POA: Diagnosis not present

## 2016-05-15 DIAGNOSIS — I509 Heart failure, unspecified: Secondary | ICD-10-CM | POA: Diagnosis not present

## 2016-05-15 DIAGNOSIS — Z8639 Personal history of other endocrine, nutritional and metabolic disease: Secondary | ICD-10-CM | POA: Diagnosis not present

## 2016-05-15 DIAGNOSIS — J189 Pneumonia, unspecified organism: Secondary | ICD-10-CM | POA: Diagnosis not present

## 2016-05-16 DIAGNOSIS — J189 Pneumonia, unspecified organism: Secondary | ICD-10-CM | POA: Diagnosis not present

## 2016-05-16 DIAGNOSIS — Z8673 Personal history of transient ischemic attack (TIA), and cerebral infarction without residual deficits: Secondary | ICD-10-CM | POA: Diagnosis not present

## 2016-05-16 DIAGNOSIS — J441 Chronic obstructive pulmonary disease with (acute) exacerbation: Secondary | ICD-10-CM | POA: Diagnosis not present

## 2016-05-16 DIAGNOSIS — Z8739 Personal history of other diseases of the musculoskeletal system and connective tissue: Secondary | ICD-10-CM | POA: Diagnosis not present

## 2016-05-16 DIAGNOSIS — Z8639 Personal history of other endocrine, nutritional and metabolic disease: Secondary | ICD-10-CM | POA: Diagnosis not present

## 2016-05-16 DIAGNOSIS — I509 Heart failure, unspecified: Secondary | ICD-10-CM | POA: Diagnosis not present

## 2016-05-17 DIAGNOSIS — Z8639 Personal history of other endocrine, nutritional and metabolic disease: Secondary | ICD-10-CM | POA: Diagnosis not present

## 2016-05-17 DIAGNOSIS — I509 Heart failure, unspecified: Secondary | ICD-10-CM | POA: Diagnosis not present

## 2016-05-17 DIAGNOSIS — J189 Pneumonia, unspecified organism: Secondary | ICD-10-CM | POA: Diagnosis not present

## 2016-05-17 DIAGNOSIS — Z8739 Personal history of other diseases of the musculoskeletal system and connective tissue: Secondary | ICD-10-CM | POA: Diagnosis not present

## 2016-05-17 DIAGNOSIS — Z8673 Personal history of transient ischemic attack (TIA), and cerebral infarction without residual deficits: Secondary | ICD-10-CM | POA: Diagnosis not present

## 2016-05-17 DIAGNOSIS — J441 Chronic obstructive pulmonary disease with (acute) exacerbation: Secondary | ICD-10-CM | POA: Diagnosis not present

## 2016-05-18 DIAGNOSIS — J441 Chronic obstructive pulmonary disease with (acute) exacerbation: Secondary | ICD-10-CM | POA: Diagnosis not present

## 2016-05-18 DIAGNOSIS — J189 Pneumonia, unspecified organism: Secondary | ICD-10-CM | POA: Diagnosis not present

## 2016-05-18 DIAGNOSIS — Z8673 Personal history of transient ischemic attack (TIA), and cerebral infarction without residual deficits: Secondary | ICD-10-CM | POA: Diagnosis not present

## 2016-05-18 DIAGNOSIS — Z8639 Personal history of other endocrine, nutritional and metabolic disease: Secondary | ICD-10-CM | POA: Diagnosis not present

## 2016-05-18 DIAGNOSIS — Z8739 Personal history of other diseases of the musculoskeletal system and connective tissue: Secondary | ICD-10-CM | POA: Diagnosis not present

## 2016-05-18 DIAGNOSIS — I509 Heart failure, unspecified: Secondary | ICD-10-CM | POA: Diagnosis not present

## 2016-05-19 DIAGNOSIS — Z8673 Personal history of transient ischemic attack (TIA), and cerebral infarction without residual deficits: Secondary | ICD-10-CM | POA: Diagnosis not present

## 2016-05-19 DIAGNOSIS — Z8639 Personal history of other endocrine, nutritional and metabolic disease: Secondary | ICD-10-CM | POA: Diagnosis not present

## 2016-05-19 DIAGNOSIS — J441 Chronic obstructive pulmonary disease with (acute) exacerbation: Secondary | ICD-10-CM | POA: Diagnosis not present

## 2016-05-19 DIAGNOSIS — J189 Pneumonia, unspecified organism: Secondary | ICD-10-CM | POA: Diagnosis not present

## 2016-05-19 DIAGNOSIS — Z8739 Personal history of other diseases of the musculoskeletal system and connective tissue: Secondary | ICD-10-CM | POA: Diagnosis not present

## 2016-05-19 DIAGNOSIS — I509 Heart failure, unspecified: Secondary | ICD-10-CM | POA: Diagnosis not present

## 2016-05-23 DIAGNOSIS — J189 Pneumonia, unspecified organism: Secondary | ICD-10-CM | POA: Diagnosis not present

## 2016-05-23 DIAGNOSIS — Z8639 Personal history of other endocrine, nutritional and metabolic disease: Secondary | ICD-10-CM | POA: Diagnosis not present

## 2016-05-23 DIAGNOSIS — Z8673 Personal history of transient ischemic attack (TIA), and cerebral infarction without residual deficits: Secondary | ICD-10-CM | POA: Diagnosis not present

## 2016-05-23 DIAGNOSIS — I509 Heart failure, unspecified: Secondary | ICD-10-CM | POA: Diagnosis not present

## 2016-05-23 DIAGNOSIS — J441 Chronic obstructive pulmonary disease with (acute) exacerbation: Secondary | ICD-10-CM | POA: Diagnosis not present

## 2016-05-23 DIAGNOSIS — Z8739 Personal history of other diseases of the musculoskeletal system and connective tissue: Secondary | ICD-10-CM | POA: Diagnosis not present

## 2016-05-26 DIAGNOSIS — J189 Pneumonia, unspecified organism: Secondary | ICD-10-CM | POA: Diagnosis not present

## 2016-05-26 DIAGNOSIS — Z8639 Personal history of other endocrine, nutritional and metabolic disease: Secondary | ICD-10-CM | POA: Diagnosis not present

## 2016-05-26 DIAGNOSIS — I509 Heart failure, unspecified: Secondary | ICD-10-CM | POA: Diagnosis not present

## 2016-05-26 DIAGNOSIS — Z8673 Personal history of transient ischemic attack (TIA), and cerebral infarction without residual deficits: Secondary | ICD-10-CM | POA: Diagnosis not present

## 2016-05-26 DIAGNOSIS — Z8739 Personal history of other diseases of the musculoskeletal system and connective tissue: Secondary | ICD-10-CM | POA: Diagnosis not present

## 2016-05-26 DIAGNOSIS — J441 Chronic obstructive pulmonary disease with (acute) exacerbation: Secondary | ICD-10-CM | POA: Diagnosis not present

## 2016-05-30 DIAGNOSIS — I509 Heart failure, unspecified: Secondary | ICD-10-CM | POA: Diagnosis not present

## 2016-05-30 DIAGNOSIS — Z8673 Personal history of transient ischemic attack (TIA), and cerebral infarction without residual deficits: Secondary | ICD-10-CM | POA: Diagnosis not present

## 2016-05-30 DIAGNOSIS — Z8639 Personal history of other endocrine, nutritional and metabolic disease: Secondary | ICD-10-CM | POA: Diagnosis not present

## 2016-05-30 DIAGNOSIS — Z8739 Personal history of other diseases of the musculoskeletal system and connective tissue: Secondary | ICD-10-CM | POA: Diagnosis not present

## 2016-05-30 DIAGNOSIS — J441 Chronic obstructive pulmonary disease with (acute) exacerbation: Secondary | ICD-10-CM | POA: Diagnosis not present

## 2016-05-30 DIAGNOSIS — J189 Pneumonia, unspecified organism: Secondary | ICD-10-CM | POA: Diagnosis not present

## 2016-05-31 DIAGNOSIS — Z8739 Personal history of other diseases of the musculoskeletal system and connective tissue: Secondary | ICD-10-CM | POA: Diagnosis not present

## 2016-05-31 DIAGNOSIS — J441 Chronic obstructive pulmonary disease with (acute) exacerbation: Secondary | ICD-10-CM | POA: Diagnosis not present

## 2016-05-31 DIAGNOSIS — Z8639 Personal history of other endocrine, nutritional and metabolic disease: Secondary | ICD-10-CM | POA: Diagnosis not present

## 2016-05-31 DIAGNOSIS — I509 Heart failure, unspecified: Secondary | ICD-10-CM | POA: Diagnosis not present

## 2016-05-31 DIAGNOSIS — J189 Pneumonia, unspecified organism: Secondary | ICD-10-CM | POA: Diagnosis not present

## 2016-05-31 DIAGNOSIS — Z8673 Personal history of transient ischemic attack (TIA), and cerebral infarction without residual deficits: Secondary | ICD-10-CM | POA: Diagnosis not present

## 2016-06-05 ENCOUNTER — Encounter: Payer: Self-pay | Admitting: Pulmonary Disease

## 2016-06-05 ENCOUNTER — Ambulatory Visit (INDEPENDENT_AMBULATORY_CARE_PROVIDER_SITE_OTHER): Payer: PRIVATE HEALTH INSURANCE | Admitting: Pulmonary Disease

## 2016-06-05 VITALS — BP 124/70 | HR 87 | Temp 97.0°F | Ht 63.0 in | Wt 130.4 lb

## 2016-06-05 DIAGNOSIS — J9611 Chronic respiratory failure with hypoxia: Secondary | ICD-10-CM | POA: Diagnosis not present

## 2016-06-05 DIAGNOSIS — I5032 Chronic diastolic (congestive) heart failure: Secondary | ICD-10-CM

## 2016-06-05 DIAGNOSIS — R9389 Abnormal findings on diagnostic imaging of other specified body structures: Secondary | ICD-10-CM

## 2016-06-05 DIAGNOSIS — Z515 Encounter for palliative care: Secondary | ICD-10-CM

## 2016-06-05 DIAGNOSIS — J449 Chronic obstructive pulmonary disease, unspecified: Secondary | ICD-10-CM | POA: Diagnosis not present

## 2016-06-05 DIAGNOSIS — J189 Pneumonia, unspecified organism: Secondary | ICD-10-CM | POA: Diagnosis not present

## 2016-06-05 DIAGNOSIS — Z8673 Personal history of transient ischemic attack (TIA), and cerebral infarction without residual deficits: Secondary | ICD-10-CM | POA: Diagnosis not present

## 2016-06-05 DIAGNOSIS — J441 Chronic obstructive pulmonary disease with (acute) exacerbation: Secondary | ICD-10-CM | POA: Diagnosis not present

## 2016-06-05 DIAGNOSIS — I509 Heart failure, unspecified: Secondary | ICD-10-CM | POA: Diagnosis not present

## 2016-06-05 DIAGNOSIS — F411 Generalized anxiety disorder: Secondary | ICD-10-CM

## 2016-06-05 DIAGNOSIS — R938 Abnormal findings on diagnostic imaging of other specified body structures: Secondary | ICD-10-CM

## 2016-06-05 DIAGNOSIS — Z8639 Personal history of other endocrine, nutritional and metabolic disease: Secondary | ICD-10-CM | POA: Diagnosis not present

## 2016-06-05 DIAGNOSIS — Z8739 Personal history of other diseases of the musculoskeletal system and connective tissue: Secondary | ICD-10-CM | POA: Diagnosis not present

## 2016-06-05 NOTE — Patient Instructions (Signed)
Today we updated your med list in our EPIC system...    Continue your current medications the same...  We decided to wean down the MEDROL 8mg  tabs to 1/2 tab each AM...    Let's plan to do this thru Christmas and if you feel that you are doing satisfactorily--    Then you can cut it further to 1/2 tab every other day (ie- 1/2, 0, 1/2, 0, etc)...  Be sure to continue your NEBULIZER 3 times daily followed by the Symbicort & Incruse as you have been doing!  Call for any questions...  Let's plan a follow up visit in 2-9mo, sooner if needed for problems.Marland KitchenMarland Kitchen

## 2016-06-05 NOTE — Progress Notes (Signed)
Subjective:     Patient ID: Heidi [name], female   DOB: 05/23/1941, 75 y.o.   MRN: [id number]  HPI 75 y/o WF former smoker who quit 2016 w/ severe COPD/emphysema & chronic hypoxemic and hypercarbic resp failure, placed on Hospice by DrSwords/ DrBurchette in 2016, hx pneumonia and small bilat pulm nodules on CT Chest, and diastolic CHF...   ~  May 13, 2015:  Initial pulmonary consult by SN>        Tehani was Hosp 10/3 - 04/27/15 by [facility] after presenting via EMS w/ cough, weakness, increased SOB; she was still smoking, not on home O2; found to have COPD exac & CAP w/ bibasilar opacities, acute on chronic hypoxemic & hypercarbic resp failure, & superimposed CHF w/ bilat L>R pleural effusions (this was tapped 10/9 yielding transudative fluid, neg cytology, neg culture)... She was seen by CCM- both MR & JN attended her in the hosp; Treated w/ Rocephin/ Zithromax, Solumedrol, Nebs, & brief stay in step-down unit on BiPap; disch on ADVAIR100-1spBid, SPIRIVA daily, PRED taper, NEBS w/ Albut... She was also treated for acute on chronic diastolic CHF- mod dil RV & reduced RV sys function (w/ preserved LVF) on 2DEcho & diuresed w/ Lasix but only disch on 20mg prn for edema... Hospice was consulted (DrGolding's note reviewed) and she has entered the Hospice program (she is a DNR, DNI) w/ a prognosis est <6mo & she was disch on ROXANOL 20mg/ml--take 1/2 ml Q2H as needed for pain, SOB, anxiety...  She last saw DrSwords 11/2013, she has established new PCP- [name] & DrBurchette- has post hosp f/u pending; Note> she cancelled 2 appts w/ DrMcQuaid recently...       Currently [name] is very stoic- states she's not having any breathing problems at the present time & credits the thoracentesis w/ her remarkable improvement; her last cigarette was 04/19/15 prior to her admission; she was disch on O2 at 4L/min & has an O2sat=89% on this in the office today (RA sat was 74%); she was unable to perform PFT today; we had  a frank discussion about her severe, end-stage COPD...       Epic records indicate that she was seen by DrWert in 2011> on Spiriva +/- Advair, stated she quit smoking 2011 on adm for COPD exac, PFT reported FEV1=1.32 (77%), FEV1/FVC ratio=50%, DLCO=70%; CXR showed COPD & biapical pleuroparenchymal scarring, CT Chest 4/11 showed ?mult pulm nodules ?early cavitation r/o MAI, cultures- only NTF, O2sat=95% on RA...  Medical Hx>   She had an annual wellness exam from her PCP 02/2015> COPD, Hx pulm nodule, Hx of pneumonia; Hypothyroidism; adenomatous colonic polyps; Hyperlipidemia; Sigmoid volvulus; Depression; Cerebrovascular disease; Positive ANA; Diverticulosis; Esophageal stricture; Arthritis; History of stroke; Internal hemorrhoids; and Rectal ulcer; protein-calorie malnutrition (she weighs 78 lbs...  EXAM shows Afeb, VSS, O2sat=89% on O2 at 4L/min; Wt=78#, 5'2"Tall, BMI=16;  Heent- neg, mallampati1;  Chest- decr BS bilat, scat rhonchi, & end-exp wheezing;  Heart- RR gr1/6 SEM w/o r/g;  Abd soft, neg;  Ext- w/o c/c/ tr edema...   Baseline CXR 12/31/14 showed hyperinflation c/w COPD, biapical pleuroparenchymal scarring, nipple shadows and T12 compression fx...  CT Chest 02/22/15 showed cardiomeg, atherosclerotic changes in Ao & coronaries, small right effusion; emhysema w/ several additional findings- RUL & lingular subpleural opacities, & 5mm LUL & RLL nodules; osteopenia and T7 +T12 compression fractures...   CXRs performed during the 04/2015 Admission showed cardiomeg, CHF w/ left effusion, COPD w/ interstitial edema & LLL opac => improved post thoracentesis   10/9 w/ 400cc removed...  ABGs during the 10/16 Hosp showed pCO2 in the 73-87 range w/ resp acidosis...   LABS 10/16 reviewed> CBC- ok w/ Hg=13 range;  Chems- note HCO3=44-45 range & she may benefit drom Acetazolamide added to her diuretic regimen if more aggressive treatment is desired...  IMP>> Heidi [name] has severe COPD w/ acute on chronic hypoxemic &  hypercarbic resp failure & cor pulmonale; her last cigarette was the day of admission 04/19/15... She also has a hx of poor compliance w/ med rx & she has once again stopped the prescribed meds perceiving that she is much better since the thoracentesis; she was referred to Hospice for outpt follow up & will continue to see her PCPs at [facility]; I told her I would be happy to recheck at any time if she so desired...  NOTE> since her disch she states that her breathing has been "so much better" that she hasn't needed her inhalers or the nebulizer, and she's off the Pred; she states she is taking the ASA81, Lasix20, Synthroid137, Xanax, and Remeron; she apparently hasn't needed the Roxanol yet...  We had a frank discussion about her end-stage disease & we "proved it to her" by checking her O2sat off the oxygen (74%), and attempting to get a Spirometry tracing (she couldn't do it);  Pt is asked to get back on her meds & we outlined a regimen>  NEBS w/ Albut Qid (breakfast, lunch, dinner, & bedtime);  ADVAIR100- 2spBid (taken after the Neb at breakfast & dinner);  SPIRIVA via handihaler once daily (taken after the Neb at lunch)...    ~  January 10, 2016:  60moROV w/ SN>  NOTE: family has moved her into her own apt 12/2015) next to her Sister-in-law/ care giver's apt      [name] for a follow up visit- she has severe COPD/emphysema w/ chronic hypoxemic and hypercarbic resp failure; [facility]; she has been followed by NP-CoryNafziger since then;  She returns today w/ her sister-in-law care giver Heidi [name]#[phone] that Hospice is planning to stop services soon but pt is c/o severe LBP x 6wks ever since she lifted a chlorox bottle & felt a "pop" in her back; she has mentioned this to Hospice but all they have done is to adjust her pain meds (Percocet5-325 & Roxanol 283mml- 0.71m70m2H prn) without further eval;  She states that her breathing  is OK- meaning she could get about in her apt before the episode of acute LBP 2wks ago;I note that she has not had any lab work or f/u CXR since [facility] & she is clearly in need of same- she has a hx of compression fx of spine and osteoporosis but not on any bone building therapy... We reviewed the following medical problems during today's office visit >>     Severe COPD> she could not perform simple spirometry when attempted 04/2015; on NEBS w/ Albut Q6H prn but it makes her too jittery, SYMBICORT160-2spBid & [facility];  REC to use Albut 1/2vial in NEB Tid regularly followed by Symbicort160-2spBid & try INCRUSE once daily => we plan ROV in 6wks w/ repeat spirometry & oximetry if she can walk...     Chronic hypoxemic and hypercarbic resp failure> O2sat was 74% on RA 10/16 and [place] p[place]=73-87 w/ resp acidosis; she is now on O2 at 2.5L/min regularly &  looks much better, chest is clearer and labs improved; we will check ambulatory oximetry on ret if able to walk for [place]...    Hx pneumonia> resolved- CXR 12/2015 shows COPD & is clear, no opacity...    Hx bilat pulm nodules on CT Chest> Her CXR is much improved 01/10/16; she will be due for a f/u CT Chest on return 02/2016...    Ex-smoker, quit 04/2015>  congrats on not smoking!    Diastolic CHF w/ pulm edema & transudative left pleural effusion 04/2015> on Lasix20 prn only    Hx cerebrovasc dis & stroke> this must have occurred in [place]- no data in EPIC & no prev CDopplers to review; rec to take ASA81/d & we will check CDopplers later...    MEDICAL Issues>  Hyperlipidemia (not on meds),  Hypothyroidism (on Synthroid137/d),  Prot-cal malnutrition (wt is up 38# & BMI=21 now),      Hx HH repair & esoph stricture> not on PPI rx at present    Hx colon polyps, divertics, hems (w/ banding), & cecal volvulus s/p right hemicolectomy at [facility] 2012> aware...    S/P TAH & BSO> aware    DJD, Back pain w/  compression fxs> she indicates that she "cracked my spine" about 67yr ago, says she was hosp x4d w/ MRI, had brace applied- no shots or vertebroplasty ([facility] are [facility] reports in Epic from 2012-2016;      Osteoporosis> she had BMD 01/2015 by PCP w/ TScore -4.8 in Lspine & -3.7 in Left FemNeck; placed on Ca++, VitD OTC, and FOSAMAX70/wk started; she picked up #4 tabs 02/11/15 but never refilled the med...    Hx anxiety/ depression> on XANAX 0.571mid prn and REMERON 1559mhs prn...    Hx poor compliance w/ medical regimen> this was before Hospice involvement in her care (04/2015) EXAM shows Afeb, VSS, O2sat=97% on O2 at 2.5L/min; Wt was 116# in [id number] 38# to BMI=21);  Heent- neg, mallampati1;  Chest- decr BS bilat w/o w/r/r;  Heart- RR gr1/6 SEM w/o r/g;  Abd soft, neg;  Ext- w/o c/c/e in wheelchair & won't stand/walk due to LBP...  CXR 01/10/16>  Much improved- some hyperinflation, bilat apical pleural thickening & bibasilar scarring, no effusion/ edema/ consolidation/ adenopathy, stable ant wedging of T12 w/ kyphosis...   Lumbar spine XRay 01/10/16>  Anterior wedging of L2 is new, old fx of T12, no other fxs & no spondylolithesis- disc sp narrowing L2-3 & L3-4, facet arthritis L5-S1, calcif in abd Ao...  LABS 12/2015>  Chems- wnl, HCO3=32, renal=wnl;  CBC- wnl;  TSH=0.01 on synthroid137;  BNP=70;  Sede=3 IMP/PLAN>>  Respiratory-wise she is improved, CXR is improved, etc but she needs to continue taking O2 and meds regularly- O2 at 2L/min by [place] 24/7, NEBS w/ Albut using 1/2 vial TID every day followed by SYMBICORT160- 2spBid and new INCRUSE one inhalation daily;  XRay of Lspine shows new compression in L2, and Epic review showed that she only took Alendronate70 for 4 wks in Aug2016- she never refilled the Rx;  Discussed CXR & Lumbar films w/ Sister-in-law care giver Nancy=> REC that Leanza re-start Calcium/ VitD/ ALENDRONATE70/wk & stay on this; she has a back  brace at home & pain meds from Hospice, needs PT for ambulation;  Second issue is her oversuppressed TSH on Synth137=> REC decr dose to 112 & recheck TSH on return... ROV planned in 6wks time.  ~  February 23, 2016:  6wk ROV & f/u w/ SN>  Hong has done remarkably well from the pulm standpoint- stabilized and improved suffic to be able to move into her own appt & care for herself w/ family support nearby; she says she feels like she's getting better too; she was working w/ PT at home but she thinks they were working her too hard & she sent them away- I explained that this therapy is the MOST IMPORTANT thing for her continued recovery!  She tells me that this is the last month of her yr on Hospice, she has Roxanol on her med list but thank goodness she hasn't needed it!  She is in a wheelchair in the office today- but tells me she is up & about at home=> we will walk her today & check O2sats...    1) Severe COPD> she could not perform simple spirometry when attempted 04/2015; on NEBS w/ Albut Q6H prn & using 1/2 vial 2-3x per wk, SYMBICORT160-2spBid & Incruse once daily; Hx medication noncompliance & not using the NEBS Tid as requested!     2) Chronic hypoxemic and hypercarbic resp failure> O2sat was 74% on RA 10/16 and ABGs in Hosp showed pCO2=73-87 w/ resp acidosis; she is now on O2 at 2.5L/min regularly & looks much better, chest is clearer and labs improved; ambulatory oximetry 02/2016 showed no desaturation on 2.5L/min...     3) Hx pneumonia> resolved- CXR 12/2015 shows COPD & clear, no opacity...    4) Hx bilat pulm nodules on CT Chest> Her CXR is much improved 01/10/16; she will be due for a f/u CT Chest to check these nodules soon...    5) Ex-smoker, quit 04/2015>  congrats on not smoking!    6) Diastolic CHF w/ pulm edema & transudative left pleural effusion 04/2015> on Lasix20 prn only now    7) Hx cerebrovasc dis & stroke> this must have occurred in [place]- no data in EPIC & no prev CDopplers to  review; rec to take ASA81/d & she will need CDopplers from her PCP on ret...    8) MEDICAL Issues>  Hyperlipidemia (not on meds),  Hypothyroidism (on Synthroid112/d),  Prot-cal malnutrition (wt is up to 114# & BMI=20 now); I reviewed all her TSH readings and called her Pharm ([facility] in [place]) & they confirmed poor med compliance in past- eery time her dose was increased she was NOT taking her meds regularly!  She is now on 112mcg/d and pill counts are accurate- TSH=0.02 so this dose is still too high for her; WE WILL DECR DOSE TO 75mcg/d and turn over follow up of this problem to her PCP- DrCory & DrBurchette...    9) Hx HH repair & esoph stricture> not on PPI rx at present    10) Hx colon polyps, divertics, hems (w/ banding), & cecal volvulus s/p right hemicolectomy at [place] 2012> aware...    11) S/P TAH & BSO> aware    12) DJD, Back pain w/ compression fxs> she indicates that she "cracked my spine" about 4yrs ago, says she was hosp x4d w/ MRI, had brace applied- no shots or vertebroplasty (this must have been done at [place] [facility] as there are no XRay reports in Epic from 2012-2016; she was taking Roxanol for pain but will be leaving hospice soon & her PCP will assume care of her osteoporosis, back pain=> pain meds and bone building therapy.    13) Osteoporosis> she had BMD 01/2015 by PCP w/ TScore -4.8 in Lspine & -3.7 in   Left FemNeck; placed on Ca++, VitD OTC, and FOSAMAX70/wk started; she picked up #4 tabs 02/11/15 but never refilled the med...    14) Hx anxiety/ depression> on XANAX 0.72mTid prn and REMERON 14mQhs prn...    15) Hx poor compliance w/ medical regimen> this was before Hospice involvement in her care (04/2015) EXAM shows Afeb, VSS, O2sat=97% on O2 at 1.5L/min; Wt = 114#, BMI=21;  Heent- neg, mallampati1;  Chest- decr BS bilat w/o w/r/r;  Heart- RR gr1/6 SEM w/o r/g;  Abd soft, neg;  Ext- w/o c/c/e in wheelchair today...  Ambulatory oximetry 02/23/16>  O2sat=94% on 1.5L/min at rest  w/ pulse=82/min;  She ambulated 3 Laps pushing her wheelchair w/ lowest O2sat=91% w/ HR=110/min...  LABS 02/23/16 showed TSH=0.02 on [place] 11226md taken regularly ([facility] IMP/PLAN>>  JudSailors stabilized from the cardiopulm standpoint & she is instructed to continue her current meds regularly Symbicort & Incruse) plus her NEBS, VentHFA, & Lasix20 as needed;  Looks like she needs CDoppler f/u, f/u FLP, management of her thyroid medication to insure compliance w/ Rx, and management of her osteoporosis/ bone building therapy & her back pain due to compression fxs => note sent to her PCP for all these...  ~  April 10, 2016:  6wk ROV w/ SN>  JudChrislynnturns for an add-on visit after [place];  She was coughing up green mucus, called Hospice 7 they did a portable at home CXR that is said to be OK, clear, and treated her w/ Prednisone, "It was just a virus" she says, now improved... She is actually doing very well w/o acute resp symptoms and she has remained on her Pred10, NEBS w/ Albut- 1/2 vial Tid, Symbicort160-2Bid, Incruse once daily... She is c/o her port O2 system-- has difficulty filling the tanks, too heavy for her, she's "scared of it"=> needs POC (wants simply-Go mini) & [facility] [place] [facility]... She is also due for her follow up CT Chest to compare to 02/2015...    Since she was here last she had a f/u visit w/ PCP- CorDorothyann Peng Bra[place]u depression- improved on Remeron for sleep, LBP- hospice XRays reported neg but showed L2 & T12 compressions- given Medrol dosepak & Salonpas patches... He has since reviewed my last OV note w/ requests for [facility] f/u for her medical issues- hypothyroid w/oversuppressed TSH on Synthroid (we cut her down to 14m59m), osteoporosis & compression fxs, due for f/u CDopplers...  SEE PROB LIST ABOVE>>     EXAM shows Afeb, VSS, O2sat=95% on O2  at ?3L/min; Wt = 120#, BMI=22;  Heent- neg, mallampati1;  Chest- decr BS bilat w/o w/r/r;  Heart- RR gr1/6 SEM w/o r/g;  Abd soft, neg;  Ext- w/o c/c/e;  Neuro- weak, no focal deficits...  LABS 04/10/16 per Cory> TSH=0.67 on Synthroid75- continue same;  VitD=16 & needs supplement w/ 50K weekly... IMP/PLAN>>  COPD/ chr resp failure stable on current regimen- continue same;  Thyroid is improved on the 14mc56mtaken regularly- continue same;  She needs VitD and bone building therapy=> start VitD50K per week and ALENDRONATE 70mg 45mper week- must get these filled regularly & take them weekly going forward without fail...  ~  May 11, 2016:  76mo RO7moadd-on appt requested for dyspnea> Lenea reCorreens for an add-on visit "because I couldn't get thru to DrCory" c/o incr SOB, hard to cough  up any phlegm, denies f/c/s, c/o no appetite, shakey/ snappy/ agitated and incr SOB w/ activity & ADLs, feels like she can't get a deep breath, "anxious all the time"- says all this occurred when PRED weaned to 1/2 tab daily & she's been off x 2wks now; Hospice came out to her house & did a CXR- reported to be clear/ NAD (not avail for review, last CXR in Epic 01/10/16=> COPD, NAD, kyphosis & compression T12;...     EXAM shows Afeb, VSS, O2sat=96% on O2 at 3L/min; Wt = 124#, BMI=22;  HEENT- neg, mallampati1;  Chest- decr BS bilat w/o w/r/r;  Heart- RR gr1/6 SEM w/o r/g;  Abd soft, neg;  Ext- w/o c/c/e;  Neuro- weak, no focal deficits... IMP/PLAN>>  Kindall has been using her NEBULIZER just prn & averaging once qod or so- we discussed a regular treatment regimen that includes using her NEB w/ 1/2 vial of Albut Tid regularly followed by her Symbicort160-2spBid & the Incruse once daily;  We will restart a systemic steroid med- try MEDROL '8mg'$  tabs (one Bid for 10d then down to 1Qam til return);  In addition she will increase her Alprazolam to 1 tab Tid regularly (she's been on 1Bid but still anxious);  Keep other meds the same including  Alendronate70 once per week, Vit D 50K once per week, and Synthroid75 daily 1st thing in the Am;  We plan ROV recheck in 3wks time...   ~  June 05, 2016:  71moROV & add-on appt at request of Hospice nurse who said she had a "little rattle" in her chest- pt denies symptoms indicating chr stable SOB/DOE, no cough/ sput/ hemoptysis/ CP/ edema...  As noted JSybrinahas severe COPD (on Medrol8, NEBS w/ 1/2 vial Albut Tid followed by Symbicort160-2spBid & Incruse once daily); chr hypoxemic & hypercarbic resp fail (on O2 at 2.5L/min); Abn Chest CT w/ bilat pulm nodules; Ex-smoker (quit 04/2015); Hx pneumonia, Hx diastolic CHF w/ pulm edema & transudative L effusion 04/2015 (on Lasix20)... Her dyspnea is multifactorial w/ all these problems in addition to anxiety & poor medication compliance...    EXAM shows Afeb, VSS, O2sat=94% on O2 at 2L/min; Wt = 130# up 7#;  HEENT- neg, mallampati1;  Chest- decr BS bilat w/ few velcro rales at bases;  Heart- RR gr1/6 SEM w/o r/g;  Abd soft, neg;  Ext- w/o c/c/e;  Neuro- weak. IMP/PLAN>>  JElyssiais concerned about the 7# wt gain on Medrol8 so we decided to decr this to 1/2 tab Qam; otherw continue same meds & we plan ROV in 380mo.    Past Medical History:  Diagnosis Date  . Arthritis   . Cerebrovascular disease   . Chronic diastolic CHF (congestive heart failure) ([facility]  . COPD (chronic obstructive pulmonary disease) ([facility]  . Depression   . Diverticulosis   . Esophageal stricture   . History of pneumonia   . History of stroke    mild oncoming  . 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 Hyperlipidemia   . Hypothyroidism   . Internal hemorrhoids   . Osteoporosis   . Pneumonia    HX of  . Positive ANA (antinuclear antibody)   . Pulmonary nodule   . Rectal ulcer   . Shortness of breath    periodically with COPD flair up  . 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([facility]    Past Surgical History:  Procedure Laterality Date  . BLADDER SURGERY  tack  . CATARACT EXTRACTION     bilateral  . COLONOSCOPY  06/22/2011   Procedure: COLONOSCOPY;  Surgeon: Lafayette Dragon, MD;  Location: [facility];  Service: Endoscopy;  Laterality: N/A;  . GASTRIC VARICES BANDING  06/22/2011   Procedure: HEMORRHOID BANDING;  Surgeon: Lafayette Dragon, MD;  Location: [facility] ENDOSCOPY;  Service: Endoscopy;  Laterality: N/A;  . HEMICOLECTOMY  2012   emergent surgery  hospital  . HIATAL HERNIA REPAIR     x 2  . TONSILLECTOMY    . TOTAL ABDOMINAL HYSTERECTOMY W/ BILATERAL SALPINGOOPHORECTOMY  1998    Outpatient Encounter Prescriptions as of 06/05/2016  Medication Sig  . albuterol (PROVENTIL HFA;VENTOLIN HFA) 108 (90 BASE) MCG/ACT inhaler Inhale 2 puffs into the lungs every 6 (six) hours as needed for wheezing or shortness of breath.  Marland Kitchen albuterol (PROVENTIL) (2.5 MG/3ML) 0.083% nebulizer solution Use 1/2 vial of ALBUTEROL in NEBULIZER three times daily...  . alendronate (FOSAMAX) 70 MG tablet Take 1 tablet (70 mg total) by mouth once a week. Take with a full glass of water on an empty stomach first thing in the morning.  Marland Kitchen ALPRAZolam (XANAX) 0.5 MG tablet Take 1 tablet (0.5 mg total) by mouth 3 (three) times daily as needed for anxiety or sleep (air hunger. dyspnea).  . budesonide-formoterol (SYMBICORT) 160-4.5 MCG/ACT inhaler Inhale 2 puffs into the lungs 2 (two) times daily.  . furosemide (LASIX) 20 MG tablet Take 1 tablet (20 mg total) by mouth daily as needed for fluid or edema.  . gabapentin (NEURONTIN) 300 MG capsule Take 1 capsule (300 mg total) by mouth 2 (two) times daily.  . halobetasol (ULTRAVATE) 0.05 % ointment Apply topically 2 (two) times daily.    Marland Kitchen levothyroxine (SYNTHROID) 75 MCG tablet Take 1 tablet (75 mcg total) by mouth daily before breakfast.  . methylPREDNISolone (MEDROL) 8 MG tablet Take 1 tablet two times daily x 10 days then 1 tablet daily until return  . mirtazapine (REMERON) 30 MG tablet Take 1 tablet (30 mg total) by mouth at  bedtime.  Marland Kitchen morphine (ROXANOL) 20 MG/ML concentrated solution Take 0.5 mLs (10 mg total) by mouth every 2 (two) hours as needed for moderate pain, severe pain, anxiety or shortness of breath. (Patient not taking: Reported on 06/05/2016)  . oxyCODONE-acetaminophen (PERCOCET) 5-325 MG tablet Take 1 to 2 tablets by mouth every 6 hours as need for pain  . simvastatin (ZOCOR) 10 MG tablet Take 1 tablet (10 mg total) by mouth at bedtime.  Marland Kitchen umeclidinium bromide (INCRUSE ELLIPTA) 62.5 MCG/INH AEPB Inhale 1 puff into the lungs daily.  . Vitamin D, Ergocalciferol, (DRISDOL) 50000 units CAPS capsule Take 1 capsule (50,000 Units total) by mouth every 7 (seven) days.   No facility-administered encounter medications on file as of 06/05/2016.     Allergies  Allergen Reactions  . Morphine And Related Nausea And Vomiting  . Penicillins Hives    Has patient had a PCN reaction causing immediate rash, facial/tongue/throat swelling, SOB or lightheadedness with hypotension: No Has patient had a PCN reaction causing severe rash involving mucus membranes or skin necrosis: No Has patient had a PCN reaction that required hospitalization No Has patient had a PCN reaction occurring within the last 10 years: No If all of the above answers are "NO", then may proceed with Cephalosporin use.    Immunization History  Administered Date(s) Administered  . H1N1 06/30/2008  . Influenza Split 04/18/2012  . Influenza Whole 06/10/2007, 04/14/2010, 04/18/2012  . Influenza, High  Dose Seasonal PF 06/02/2015, 04/10/2016  . Influenza,inj,Quad PF,36+ Mos 04/16/2013  . Pneumococcal Conjugate-13 06/02/2015  . Pneumococcal Polysaccharide-23 05/17/2006, 04/16/2013  . Td 03/29/1999  . Tdap 04/16/2013    Current Medications, Allergies, Past Medical History, Past Surgical History, Family History, and Social History were reviewed in [employer] record.   Review of Systems             All symptoms NEG except  where BOLDED >>  Constitutional:  F/C/S, fatigue, anorexia, unexpected weight change. HEENT:  HA, visual changes, hearing loss, earache, nasal symptoms, sore throat, mouth sores, hoarseness. Resp:  cough, sputum, hemoptysis; SOB, tightness, wheezing. Cardio:  CP, palpit, DOE, orthopnea, edema. GI:  N/V/D/C, blood in stool; reflux, abd pain, distention, gas. GU:  dysuria, freq, urgency, hematuria, flank pain, voiding difficulty. MS:  joint pain, swelling, tenderness, decr ROM; neck pain, back pain, etc. Neuro:  HA, tremors, seizures, dizziness, syncope, weakness, numbness, gait abn. Skin:  suspicious lesions or skin rash. Heme:  adenopathy, bruising, bleeding. Psyche:  confusion, agitation, sleep disturbance, hallucinations, anxiety, depression suicidal.   Objective:   Physical Exam       Vital Signs:  Reviewed...   General:  WD, thin, 75 y/o WM in NAD; alert & oriented; pleasant & cooperative... HEENT:  /AT; Conjunctiva- pink, Sclera- nonicteric, EOM-wnl, PERRLA, EACs-clear, TMs-wnl; NOSE-clear; THROAT-clear & wnl.  Neck:  Supple w/ fair ROM; no JVD; normal carotid impulses w/o bruits; no thyromegaly or nodules palpated; no lymphadenopathy.  Chest:  Decr BS bilat, clear w/o w/r/r heard... Heart:  Regular Rhythm; ?S4, gr1/6 SEM Abdomen:  Soft & nontender- no guarding or rebound; normal bowel sounds; no organomegaly or masses palpated. Ext:  decr ROM; without deformities +arthritic changes; no varicose veins, venous insuffic, no edema;  Pulses intact w/o bruits. Neuro:  No focal neuro deficits- pt in wheelchair & won't stand or walk due to LBP... Derm:  No lesions noted; no rash etc. Lymph:  No cervical, supraclavicular, axillary, or inguinal adenopathy palpated.   Assessment:      IMP>>     Severe COPD> she could not perform simple spirometry when attempted 04/2015; on NEBS w/ Albut Q6H prn but it makes her too jittery, SYMBICORT160-2spBid & not using the [facility] due to $$;  REC to  use Albut 1/2vial in NEB Tid regularly followed by Symbicort160-2spBid & try INCRUSE once daily => stay on these regularly...    Chronic hypoxemic and hypercarbic resp failure> O2sat was 74% on RA 10/16 and ABGs in [place] showed pCO2=73-87 w/ resp acidosis; she is now on O2 at 2.5L/min regularly & looks much better, chest is clearer and labs improved;  Ambulatory oximetry 02/2016 w/ sats in the 90s on 1.5L after 3 Laps- much improved...    Hx pneumonia> resolved- CXR 12/2015 shows COPD & is clear, no opacity...    Hx bilat pulm nodules on CT Chest> Her CXR is much improved 01/10/16; she is due for f/u CT Chest but can't get this til she is OFF Hospice!    Ex-smoker, quit 04/2015>  congrats on not smoking!    Diastolic CHF w/ pulm edema & transudative left pleural effusion 04/2015> on Lasix20 prn only    Hx cerebrovasc dis & stroke> this must have occurred in [place]- no data in EPIC & no prev CDopplers to review; rec to take ASA81/d & we will check CDopplers later...    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    S/P TAH & BSO> aware    DJD, Back pain w/ compression fxs> she indicates that she "cracked my spine" about 46yrago, says she was hosp x4d w/ MRI, had brace applied- no shots or vertebroplasty (this [facility] there are no XRay reports in Epic from 2012-2016;      Osteoporosis> she had BMD 01/2015 by PCP w/ TScore -4.8 in Lspine & -3.7 in Left FemNeck; placed on Ca++, VitD OTC, and FOSAMAX70/wk started; she picked up #4 tabs 02/11/15 but never refilled the med; WE STARTED VITD 50K per week & ALENDRONATE '70mg'$ /wk (Sep2017).    Hx  anxiety/ depression> on XANAX 0.'5mg'$ Tid prn and REMERON '15mg'$  Qhs prn...    Hx poor compliance w/ medical regimen> this was before Hospice involvement in her care (04/2015)  PLAN>>  01/10/16>   Respiratory-wise she is improved, CXR is improved, etc but she needs to continue taking O2 and meds regularly- O2 at 2L/min by [place] 24/7, NEBS w/ Albut using 1/2 vial TID every day followed by SYMBICORT160- 2spBid and new INCRUSE one inhalation daily;  XRay of Lspine shows new compression in L2, and Epic review showed that she only took Alendronate70 for 4 wks in Aug2016- she never refilled the Rx;  Discussed CXR & Lumbar films w/ Sister-in-law care giver Nancy=> REC that Kelyse re-start Calcium/ VitD/ ALENDRONATE70/wk & stay on this (she never did); she has a back brace at home & pain meds from Hospice, needs PT for ambulation;  We decreased her Synthroid to 11228md as well... ROV planned in 6wks time. 02/23/16>   JudGloriouss stabilized from the cardiopulm standpoint & she is instructed to continue her current meds regularly (Symbicort & Incruse) plus her NEBS, VentHFA, & Lasix20 as needed;  Looks like she needs CDoppler f/u, f/u FLP, management of her thyroid medication to insure compliance w/ Rx (we cut her to 55m76moday), and management of her osteoporosis/ bone building therapy & her back pain due to compression fxs => note sent to her PCP... 04/10/16>   JudyMarshiads to continue her O2, Pred10, NEBS, Symbicort, Incruse regularly;  In addition she needs to stay on the Synthroid55mc57mdose regularly;  And finally needs VitD50K & Alendronate70 both weekly for her bones... 05/11/16>   Ritisha Michellbeen using her NEBULIZER just prn & averaging once qod or so- we discussed a regular treatment regimen that includes using her NEB w/ 1/2 vial of Albut Tid regularly followed by her Symbicort160-2spBid & the Incruse once daily;  We will restart a systemic steroid med- try MEDROL '8mg'$  tabs (one Bid for 10d then down to 1Qam til return);  In  addition she will increase her Alprazolam to 1 tab Tid regularly (she's been on 1Bid but still anxious);  Keep other meds the same including Alendronate70 once per week, Vit D 50K once per week, and Synthroid75 daily 1st thing in the Am;  We plan ROV recheck in 3wks time... 06/05/16>   Add on appt per Hospice nurse who heard rales at [facility], pt is not acutely symptomatic- but is concerned about 7# wt gain on Medrol8 so we decided to decr this to  1/2 tab Qam; otherw continue same meds & we plan ROV in 66mo..     Plan:     Patient's Medications  New Prescriptions   No medications on file  Previous Medications   ALBUTEROL (PROVENTIL HFA;VENTOLIN HFA) 108 (90 BASE) MCG/ACT INHALER    Inhale 2 puffs into the lungs every 6 (six) hours as needed for wheezing or shortness of breath.   ALBUTEROL (PROVENTIL) (2.5 MG/3ML) 0.083% NEBULIZER SOLUTION    Use 1/2 vial of ALBUTEROL in NEBULIZER three times daily...   ALENDRONATE (FOSAMAX) 70 MG TABLET    Take 1 tablet (70 mg total) by mouth once a week. Take with a full glass of water on an empty stomach first thing in the morning.   ALPRAZOLAM (XANAX) 0.5 MG TABLET    Take 1 tablet (0.5 mg total) by mouth 3 (three) times daily as needed for anxiety or sleep (air hunger. dyspnea).   BUDESONIDE-FORMOTEROL (SYMBICORT) 160-4.5 MCG/ACT INHALER    Inhale 2 puffs into the lungs 2 (two) times daily.   FUROSEMIDE (LASIX) 20 MG TABLET    Take 1 tablet (20 mg total) by mouth daily as needed for fluid or edema.   GABAPENTIN (NEURONTIN) 300 MG CAPSULE    Take 1 capsule (300 mg total) by mouth 2 (two) times daily.   HALOBETASOL (ULTRAVATE) 0.05 % OINTMENT    Apply topically 2 (two) times daily.     LEVOTHYROXINE (SYNTHROID) 75 MCG TABLET    Take 1 tablet (75 mcg total) by mouth daily before breakfast.   METHYLPREDNISOLONE (MEDROL) 8 MG TABLET    Take 1 tablet two times daily x 10 days then 1 tablet daily until return   MIRTAZAPINE (REMERON) 30 MG TABLET    Take 1 tablet (30 mg  total) by mouth at bedtime.   MORPHINE (ROXANOL) 20 MG/ML CONCENTRATED SOLUTION    Take 0.5 mLs (10 mg total) by mouth every 2 (two) hours as needed for moderate pain, severe pain, anxiety or shortness of breath.   OXYCODONE-ACETAMINOPHEN (PERCOCET) 5-325 MG TABLET    Take 1 to 2 tablets by mouth every 6 hours as need for pain   SIMVASTATIN (ZOCOR) 10 MG TABLET    Take 1 tablet (10 mg total) by mouth at bedtime.   UMECLIDINIUM BROMIDE (INCRUSE ELLIPTA) 62.5 MCG/INH AEPB    Inhale 1 puff into the lungs daily.   VITAMIN D, ERGOCALCIFEROL, (DRISDOL) 50000 UNITS CAPS CAPSULE    Take 1 capsule (50,000 Units total) by mouth every 7 (seven) days.  Modified Medications   No medications on file  Discontinued Medications   No medications on file

## 2016-06-06 DIAGNOSIS — J189 Pneumonia, unspecified organism: Secondary | ICD-10-CM | POA: Diagnosis not present

## 2016-06-06 DIAGNOSIS — I509 Heart failure, unspecified: Secondary | ICD-10-CM | POA: Diagnosis not present

## 2016-06-06 DIAGNOSIS — Z8739 Personal history of other diseases of the musculoskeletal system and connective tissue: Secondary | ICD-10-CM | POA: Diagnosis not present

## 2016-06-06 DIAGNOSIS — Z8673 Personal history of transient ischemic attack (TIA), and cerebral infarction without residual deficits: Secondary | ICD-10-CM | POA: Diagnosis not present

## 2016-06-06 DIAGNOSIS — J441 Chronic obstructive pulmonary disease with (acute) exacerbation: Secondary | ICD-10-CM | POA: Diagnosis not present

## 2016-06-06 DIAGNOSIS — Z8639 Personal history of other endocrine, nutritional and metabolic disease: Secondary | ICD-10-CM | POA: Diagnosis not present

## 2016-06-07 DIAGNOSIS — J441 Chronic obstructive pulmonary disease with (acute) exacerbation: Secondary | ICD-10-CM | POA: Diagnosis not present

## 2016-06-07 DIAGNOSIS — Z8673 Personal history of transient ischemic attack (TIA), and cerebral infarction without residual deficits: Secondary | ICD-10-CM | POA: Diagnosis not present

## 2016-06-07 DIAGNOSIS — Z8639 Personal history of other endocrine, nutritional and metabolic disease: Secondary | ICD-10-CM | POA: Diagnosis not present

## 2016-06-07 DIAGNOSIS — J189 Pneumonia, unspecified organism: Secondary | ICD-10-CM | POA: Diagnosis not present

## 2016-06-07 DIAGNOSIS — I509 Heart failure, unspecified: Secondary | ICD-10-CM | POA: Diagnosis not present

## 2016-06-07 DIAGNOSIS — Z8739 Personal history of other diseases of the musculoskeletal system and connective tissue: Secondary | ICD-10-CM | POA: Diagnosis not present

## 2016-06-09 DIAGNOSIS — Z8739 Personal history of other diseases of the musculoskeletal system and connective tissue: Secondary | ICD-10-CM | POA: Diagnosis not present

## 2016-06-09 DIAGNOSIS — Z8673 Personal history of transient ischemic attack (TIA), and cerebral infarction without residual deficits: Secondary | ICD-10-CM | POA: Diagnosis not present

## 2016-06-09 DIAGNOSIS — J189 Pneumonia, unspecified organism: Secondary | ICD-10-CM | POA: Diagnosis not present

## 2016-06-09 DIAGNOSIS — Z8639 Personal history of other endocrine, nutritional and metabolic disease: Secondary | ICD-10-CM | POA: Diagnosis not present

## 2016-06-09 DIAGNOSIS — J441 Chronic obstructive pulmonary disease with (acute) exacerbation: Secondary | ICD-10-CM | POA: Diagnosis not present

## 2016-06-09 DIAGNOSIS — I509 Heart failure, unspecified: Secondary | ICD-10-CM | POA: Diagnosis not present

## 2016-06-19 ENCOUNTER — Ambulatory Visit (INDEPENDENT_AMBULATORY_CARE_PROVIDER_SITE_OTHER): Payer: Medicare Other | Admitting: Pulmonary Disease

## 2016-06-19 ENCOUNTER — Ambulatory Visit (INDEPENDENT_AMBULATORY_CARE_PROVIDER_SITE_OTHER)
Admission: RE | Admit: 2016-06-19 | Discharge: 2016-06-19 | Disposition: A | Discharge status: Home / Self Care | Payer: Medicare Other | Source: Ambulatory Visit | Attending: Pulmonary Disease | Admitting: Pulmonary Disease

## 2016-06-19 ENCOUNTER — Other Ambulatory Visit (INDEPENDENT_AMBULATORY_CARE_PROVIDER_SITE_OTHER): Payer: Medicare Other

## 2016-06-19 VITALS — BP 100/64 | HR 115 | Temp 96.9°F | Ht 63.0 in | Wt 131.1 lb

## 2016-06-19 DIAGNOSIS — J441 Chronic obstructive pulmonary disease with (acute) exacerbation: Secondary | ICD-10-CM

## 2016-06-19 DIAGNOSIS — R0600 Dyspnea, unspecified: Secondary | ICD-10-CM

## 2016-06-19 DIAGNOSIS — J9611 Chronic respiratory failure with hypoxia: Secondary | ICD-10-CM

## 2016-06-19 DIAGNOSIS — J449 Chronic obstructive pulmonary disease, unspecified: Secondary | ICD-10-CM | POA: Diagnosis not present

## 2016-06-19 DIAGNOSIS — R05 Cough: Secondary | ICD-10-CM | POA: Diagnosis not present

## 2016-06-19 LAB — CBC WITH DIFFERENTIAL/PLATELET
BASOS ABS: 0 10*3/uL (ref 0.0–0.1)
Basophils Relative: 0.5 % (ref 0.0–3.0)
EOS ABS: 0.1 10*3/uL (ref 0.0–0.7)
Eosinophils Relative: 1.1 % (ref 0.0–5.0)
HCT: 40 % (ref 36.0–46.0)
Hemoglobin: 13.5 g/dL (ref 12.0–15.0)
LYMPHS ABS: 2.1 10*3/uL (ref 0.7–4.0)
Lymphocytes Relative: 25.6 % (ref 12.0–46.0)
MCHC: 33.8 g/dL (ref 30.0–36.0)
MCV: 90.7 fl (ref 78.0–100.0)
Monocytes Absolute: 0.6 10*3/uL (ref 0.1–1.0)
Monocytes Relative: 7.7 % (ref 3.0–12.0)
NEUTROS ABS: 5.5 10*3/uL (ref 1.4–7.7)
NEUTROS PCT: 65.1 % (ref 43.0–77.0)
PLATELETS: 255 10*3/uL (ref 150.0–400.0)
RBC: 4.41 Mil/uL (ref 3.87–5.11)
RDW: 14.6 % (ref 11.5–15.5)
WBC: 8.4 10*3/uL (ref 4.0–10.5)

## 2016-06-19 LAB — BASIC METABOLIC PANEL
BUN: 13 mg/dL (ref 6–23)
CALCIUM: 9.5 mg/dL (ref 8.4–10.5)
CO2: 31 meq/L (ref 19–32)
CREATININE: 0.57 mg/dL (ref 0.40–1.20)
Chloride: 99 mEq/L (ref 96–112)
GFR: 109.96 mL/min (ref >60.00)
GLUCOSE: 94 mg/dL (ref 70–99)
Potassium: 4.1 mEq/L (ref 3.5–5.1)
Sodium: 140 mEq/L (ref 135–145)

## 2016-06-19 LAB — SEDIMENTATION RATE: Sed Rate: 49 mm/hr — ABNORMAL HIGH (ref 0–30)

## 2016-06-19 LAB — BRAIN NATRIURETIC PEPTIDE: Pro B Natriuretic peptide (BNP): 19 pg/mL (ref 0.0–100.0)

## 2016-06-19 MED ORDER — METHYLPREDNISOLONE 8 MG PO TABS: ORAL_TABLET | ORAL | 6 refills | Status: DC

## 2016-06-19 MED ORDER — LEVOFLOXACIN 500 MG PO TABS: 500.0000 mg | ORAL_TABLET | Freq: Every day | ORAL | 0 refills | Status: DC

## 2016-06-19 NOTE — Patient Instructions (Addendum)
Today we updated your med list in our EPIC system...     Today we checked a follow up CXR & some blood work...    We will contact you w/ the results when available...   Remember to do your NEBULIZER THREE TIMES DAILY,    Followed by the [facility] wice daily & the INCRUSE once daily...  Let's increase the MEDROL back to 8mg  (one tab) daily for the next 2 weeks,    Then plan to decrease back to 1/2 tab each AM & please STAY ON THIS DOSE UNTIL RETURN VISIT.Marland KitchenMarland Kitchen  OK to use the [facility] 1200mg  twice daily & lots of water...  We will contact your DME provider regarding a Portable Oxygen concentrator for use at 2-3L/min  Call for any questions...   You have a follow up appt 08/14/16 at 11:30AM..Marland Kitchen

## 2016-06-22 ENCOUNTER — Telehealth: Payer: Self-pay | Admitting: Pulmonary Disease

## 2016-06-22 NOTE — Telephone Encounter (Signed)
Called and spoke with Heidi [name] and she is aware of results.  per SN.

## 2016-06-28 ENCOUNTER — Telehealth: Payer: Self-pay | Admitting: Adult Health

## 2016-06-28 DIAGNOSIS — F32A Depression, unspecified: Secondary | ICD-10-CM

## 2016-06-28 DIAGNOSIS — G4700 Insomnia, unspecified: Secondary | ICD-10-CM

## 2016-06-28 DIAGNOSIS — F329 Major depressive disorder, single episode, unspecified: Secondary | ICD-10-CM

## 2016-06-28 NOTE — Telephone Encounter (Signed)
Pt has been out for several days and needs tonight. They thought hospice had called [facility] to have these refilled. But now pt has not slept in several nights and is anxious. thanks

## 2016-06-28 NOTE — Telephone Encounter (Signed)
Since pt has been switched from hospice, pt has not gotten any refills.  Pt has not been sleeping and would also like a refill of   mirtazapine (REMERON) 15 MG tablet   Pt also needs ALPRAZolam (XANAX) 0.5 MG tablet gabapentin (NEURONTIN) 300 MG capsule  [facility]/ [address] [place], [place]  Pt has appt 12/28

## 2016-06-28 NOTE — Telephone Encounter (Signed)
Ok to refill 

## 2016-06-29 MED ORDER — GABAPENTIN 300 MG PO CAPS: 300.0000 mg | ORAL_CAPSULE | Freq: Two times a day (BID) | ORAL | 2 refills | Status: DC

## 2016-06-29 MED ORDER — MIRTAZAPINE 30 MG PO TABS: 30.0000 mg | ORAL_TABLET | Freq: Every day | ORAL | 0 refills | Status: DC

## 2016-06-29 MED ORDER — ALPRAZOLAM 0.5 MG PO TABS: 0.5000 mg | ORAL_TABLET | Freq: Three times a day (TID) | ORAL | 0 refills | Status: DC | PRN

## 2016-06-29 NOTE — Telephone Encounter (Signed)
Alprazolam called to the pharmacy and left on machine.  Remeron and Neurontin sent by e-scribe.

## 2016-07-03 ENCOUNTER — Other Ambulatory Visit: Payer: Self-pay | Admitting: Pulmonary Disease

## 2016-07-12 ENCOUNTER — Other Ambulatory Visit: Payer: Self-pay | Admitting: Pulmonary Disease

## 2016-07-13 ENCOUNTER — Ambulatory Visit: Payer: Medicare Other

## 2016-07-21 ENCOUNTER — Ambulatory Visit: Payer: Medicare Other

## 2016-07-21 NOTE — Progress Notes (Deleted)
Subjective:   Heidi [name] is a 76 y.o. female who presents for Medicare Annual (Subsequent) preventive examination.  The Patient was informed that the wellness visit is to identify future health risk and educate and initiate measures that can reduce risk for increased disease through the lifespan.    NO ROS; Medicare Wellness Visit  Describes health as good, fair or great?   Preventive Screening -Counseling & Management  Osteoporosis: Dexa; -4.8 check on fosamax  Colonoscopy ; Mother had colon cancer / last 06/2011 Repeat in 06/2016 Father had COPD and MI  Mammogram 01/2015   Smoking history/ former smoker; quit 2011 x 61 yo  LDCT if applicable with a 30 year hx as well as AA A deferred to pulmonology  Smokeless tobacco  Second Hand Smoke status; No Smokers in the home  ETOH   RISK FACTORS Regular exercise  Diet Fall risk  Mobility of Functional changes this year? Safety; community, wears sunscreen, safe place for firearms; Motor vehicle accidents;   Cardiac Risk Factors:  Advanced aged > 91 in men; >65 in women Hyperlipidemia - ratio is 3; chol 267; HDL 84; LDL 149 and trig 169  Diabetes neg Family History  Obesity  Eye exam  Depression Screen PhQ 2: negative  Activities of Daily Living - See functional screen   Cognitive testing; Ad8 score; 0 or less than 2  MMSE deferred or completed if AD8 + 2 issues  Advanced Directives   List the name of Physicians or other Practitioners you currently use:   Immunization History  Administered Date(s) Administered  . H1N1 06/30/2008  . Influenza Split 04/18/2012  . Influenza Whole 06/10/2007, 04/14/2010, 04/18/2012  . Influenza, High Dose Seasonal PF 06/02/2015, 04/10/2016  . Influenza,inj,Quad PF,36+ Mos 04/16/2013  . Pneumococcal Conjugate-13 06/02/2015  . Pneumococcal Polysaccharide-23 05/17/2006, 04/16/2013  . Td 03/29/1999  . Tdap 04/16/2013   Required Immunizations needed today  Screening test up to  date or reviewed for plan of completion Health Maintenance Due  Topic Date Due  . ZOSTAVAX  09/05/2001            Objective:     Vitals: There were no vitals taken for this visit.  There is no height or weight on file to calculate BMI.   Tobacco History  Smoking Status  . Former Smoker  . Quit date: 11/01/2009  Smokeless Tobacco  . Never Used     Counseling given: Not Answered   Past Medical History:  Diagnosis Date  . Arthritis   . Cerebrovascular disease   . Chronic diastolic CHF (congestive heart failure) ([place]) 02/23/2016  . COPD (chronic obstructive pulmonary disease) ([place])   . Depression   . Diverticulosis   . Esophageal stricture   . History of pneumonia   . History of stroke    mild oncoming  . 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 Hyperlipidemia   . Hypothyroidism   . Internal hemorrhoids   . Osteoporosis   . Pneumonia    HX of  . Positive ANA (antinuclear antibody)   . Pulmonary nodule   . Rectal ulcer   . Shortness of breath    periodically with COPD flair up  . 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 BLADDER SURGERY     tack  . CATARACT EXTRACTION     bilateral  . COLONOSCOPY  06/22/2011   Procedure: COLONOSCOPY;  Surgeon: Lowella Bandy  Olevia Perches, MD;  Location: [facility];  Service: Endoscopy;  Laterality: N/A;  . GASTRIC VARICES BANDING  06/22/2011   Procedure: HEMORRHOID BANDING;  Surgeon: Lafayette Dragon, MD;  Location: [facility] END[facility]COPY;  Service: Endoscopy;  Laterality: N/A;  . HEMICOLECTOMY  2012   emergent surgery [facility]  . HIATAL HERNIA REPAIR     x 2  . TONSILLECTOMY    . TOTAL ABDOMINAL HYSTERECTOMY W/ BILATERAL SALPINGOOPHORECTOMY  1998   Family History  Problem Relation Age of Onset  . Colon cancer Mother   . COPD Father   . Heart attack Father   . Stroke Maternal Aunt   . Colon polyps Brother   . Liver disease Maternal Aunt   . Breast cancer Maternal Aunt     . Hypertension Son   . Hypothyroidism Daughter    History  Sexual Activity  . Sexual activity: Not Currently    Outpatient Encounter Prescriptions as of 07/21/2016  Medication Sig  . albuterol (PROVENTIL HFA;VENTOLIN HFA) 108 (90 BASE) MCG/ACT inhaler Inhale 2 puffs into the lungs every 6 (six) hours as needed for wheezing or shortness of breath.  Marland Kitchen albuterol (PROVENTIL) (2.5 MG/3ML) 0.083% nebulizer solution Use 1/2 vial of ALBUTEROL in NEBULIZER three times daily...  . alendronate (FOSAMAX) 70 MG tablet Take 1 tablet (70 mg total) by mouth once a week. Take with a full glass of water on an empty stomach first thing in the morning.  Marland Kitchen ALPRAZolam (XANAX) 0.5 MG tablet Take 1 tablet (0.5 mg total) by mouth 3 (three) times daily as needed for anxiety or sleep (air hunger. dyspnea).  . budesonide-formoterol (SYMBICORT) 160-4.5 MCG/ACT inhaler Inhale 2 puffs into the lungs 2 (two) times daily.  . furosemide (LASIX) 20 MG tablet Take 1 tablet (20 mg total) by mouth daily as needed for fluid or edema.  . gabapentin (NEURONTIN) 300 MG capsule Take 1 capsule (300 mg total) by mouth 2 (two) times daily.  . halobetasol (ULTRAVATE) 0.05 % ointment Apply topically 2 (two) times daily.    Marland Kitchen levofloxacin (LEVAQUIN) 500 MG tablet Take 1 tablet (500 mg total) by mouth daily.  Marland Kitchen levothyroxine (SYNTHROID) 75 MCG tablet Take 1 tablet (75 mcg total) by mouth daily before breakfast.  . methylPREDNISolone (MEDROL) 8 MG tablet Take 1 daily x 14 days then 1/2 tablet daily thereafter  . mirtazapine (REMERON) 30 MG tablet Take 1 tablet (30 mg total) by mouth at bedtime.  Marland Kitchen morphine (ROXANOL) 20 MG/ML concentrated solution Take 0.5 mLs (10 mg total) by mouth every 2 (two) hours as needed for moderate pain, severe pain, anxiety or shortness of breath. (Patient not taking: Reported on 06/05/2016)  . oxyCODONE-acetaminophen (PERCOCET) 5-325 MG tablet Take 1 to 2 tablets by mouth every 6 hours as need for pain  .  simvastatin (ZOCOR) 10 MG tablet Take 1 tablet (10 mg total) by mouth at bedtime.  Marland Kitchen umeclidinium bromide (INCRUSE ELLIPTA) 62.5 MCG/INH AEPB Inhale 1 puff into the lungs daily.  . Vitamin D, Ergocalciferol, (DRISDOL) 50000 units CAPS capsule Take 1 capsule (50,000 Units total) by mouth every 7 (seven) days.   No facility-administered encounter medications on file as of 07/21/2016.     Activities of Daily Living No flowsheet data found.  Patient Care Team: Dorothyann Peng, NP as PCP - General (Family Medicine)    Assessment:    *** Exercise Activities and Dietary recommendations    Goals    . Gain weight    . Prevent Falls  Fall Risk Fall Risk  02/17/2015 12/15/2014 12/15/2014 12/15/2014 12/03/2013  Falls in the past year? Yes - Yes Yes Yes  Number falls in past yr: 2 or more - 2 or more 2 or more 2 or more  Injury with Fall? Yes No - Yes -  Risk Factor Category  High Fall Risk High Fall Risk - - High Fall Risk  Risk for fall due to : History of fall(s);Impaired mobility - - History of fall(s) History of fall(s)  Follow up - Falls evaluation completed;Education provided;Falls prevention discussed;Follow up appointment - - -   Depression Screen PHQ 2/9 Scores 02/10/2016 02/17/2015 12/15/2014 12/15/2014  PHQ - 2 Score 0 0 6 6  PHQ- 9 Score - - 18 11     Cognitive Function        Immunization History  Administered Date(s) Administered  . H1N1 06/30/2008  . Influenza Split 04/18/2012  . Influenza Whole 06/10/2007, 04/14/2010, 04/18/2012  . Influenza, High Dose Seasonal PF 06/02/2015, 04/10/2016  . Influenza,inj,Quad PF,36+ Mos 04/16/2013  . Pneumococcal Conjugate-13 06/02/2015  . Pneumococcal Polysaccharide-23 05/17/2006, 04/16/2013  . Td 03/29/1999  . Tdap 04/16/2013   Screening Tests Health Maintenance  Topic Date Due  . ZOSTAVAX  09/05/2001  . MAMMOGRAM  02/16/2017  . COLONOSCOPY  06/21/2021  . TETANUS/TDAP  04/17/2023  . INFLUENZA VACCINE  Completed  . DEXA SCAN   Completed  . PNA vac Low Risk Adult  Completed      Plan:   *** During the course of the visit the patient was educated and counseled about the following appropriate screening and preventive services:   Vaccines to include Pneumoccal, Influenza, Hepatitis B, Td, Zostavax, HCV  Electrocardiogram  Cardiovascular Disease  Colorectal cancer screening  Bone density screening  Diabetes screening  Glaucoma screening  Mammography/PAP  Nutrition counseling   Patient Instructions (the written plan) was given to the patient.   Wynetta Fines, RN  07/21/2016

## 2016-07-26 ENCOUNTER — Other Ambulatory Visit: Payer: Self-pay | Admitting: Adult Health

## 2016-07-26 ENCOUNTER — Ambulatory Visit (INDEPENDENT_AMBULATORY_CARE_PROVIDER_SITE_OTHER): Payer: Medicare Other

## 2016-07-26 VITALS — BP 112/80 | HR 91 | Ht 64.0 in | Wt 130.2 lb

## 2016-07-26 DIAGNOSIS — H833X1 Noise effects on right inner ear: Secondary | ICD-10-CM

## 2016-07-26 DIAGNOSIS — Z0000 Encounter for general adult medical examination without abnormal findings: Secondary | ICD-10-CM | POA: Diagnosis not present

## 2016-07-26 MED ORDER — ALPRAZOLAM 0.5 MG PO TABS: 0.5000 mg | ORAL_TABLET | Freq: Three times a day (TID) | ORAL | 0 refills | Status: DC | PRN

## 2016-07-26 MED ORDER — SIMVASTATIN 10 MG PO TABS: 10.0000 mg | ORAL_TABLET | Freq: Every day | ORAL | 3 refills | Status: DC

## 2016-07-26 MED ORDER — VITAMIN D (ERGOCALCIFEROL) 1.25 MG (50000 UNIT) PO CAPS: 50000.0000 [IU] | ORAL_CAPSULE | ORAL | 3 refills | Status: DC

## 2016-07-26 MED ORDER — LEVOTHYROXINE SODIUM 75 MCG PO TABS: 75.0000 ug | ORAL_TABLET | Freq: Every day | ORAL | 3 refills | Status: DC

## 2016-07-26 NOTE — Progress Notes (Signed)
Subjective:   Heidi [name] is a 76 y.o. female who presents for Medicare Annual (Subsequent) preventive examination.  The Patient was informed that the wellness visit is to identify future health risk and educate and initiate measures that can reduce risk for increased disease through the lifespan.    NO ROS; Medicare Wellness Visit  Describes health as good, fair or great? good   The following written screening schedule of preventive measures were reviewed with assessment and plan made per below and patient instructions:  Smoking history reviewed  The patient states she basically smoked from her 20's and smoked over 2 packs per day  Educated regarding LDCT if applicable with a 30 year hx States pulmonologist did this  Second Hand Smoke status; No Smokers in the home at the present time Just quit smoking in October when she was told she would not live over 6 months   ETOH; no   RISK FACTORS Regular exercise- has been on oxygen since October ' Still clean home; does her laundry sometimes Has some help with sweeping, vacuuming and making her bed Can only stand x 5 minutes at a time due to back pain; no current compressions but states due to long term issues    Diet; BMI 22   incorporates fruits and vegetables;  Breakfast; oatmeal or grits Sausage biscuits; BLT Heidi [name] her sister in law brings her food over [employer]; hot dogs; salads; Transport planner; had a meal delivered   Lipids reviewed and reducing cholesterol discussed   Fall risk: presented in w/c; can stand for only 5 minutes  Recently got "stuck" on commode at restaurant but did not use handicapped bathroom  Has elevated commode and has potty chair over toilet Heidi [name] helps her shower and gets in tub by herself and has a tub seat Was on hospice; came out of the hospital with 6 months to live Now doing well; learned to walk again Had CHF and hypoxia 04/2015;   Mobility of Functional changes this year? Major  changes;  She was driving prior to hospitalization in October Went to [facility] every day; paid her groceries;   Better and in w/c now; weak but functional  (dtr is staying with her for now)  She had a trial of PT and stopped; found it to dificult States hx [age] yo fell in bathtub and cracked her spine   Safety at home and  Community reviewed; She has cell phone and Heidi [name] is on speed dial and lives approx 4 min away and is her HCPOA   wears sunscreen if in the sun; not out  Keeps firearms in a safe place if they exist no  Safe driving recommendations for older adults/ no driving since hospitalization  Depression Screen PhQ 2: negative  Activities of Daily Living - See functional screen   Cognitive testing; Ad8 score; 0 or less than 2  MMSE deferred or completed if AD8 + 2 issues  Advanced Directives reviewed for completion; discussion with MD as well as supportive resources as needed  Patient Care Team: Dorothyann Peng, NP as PCP - General (Family Medicine) Dr Lenna Gilford; pulmonology   Preventives screens reviewed Colonoscopy 06/2011  To be repeated 06/2016; Heidi [name] to fup with call to GI  Mammogram 02/2015; due anytime; Heidi [name] to schedule Dexa Osteoporosis ; currentlly on fosamax  Pap- no; waived    Immunization History  Administered Date(s) Administered  . H1N1 06/30/2008  . Influenza Split 04/18/2012  . Influenza Whole 06/10/2007, 04/14/2010, 04/18/2012  .  Influenza, High Dose Seasonal PF 06/02/2015, 04/10/2016  . Influenza,inj,Quad PF,36+ Mos 04/16/2013  . Pneumococcal Conjugate-13 06/02/2015  . Pneumococcal Polysaccharide-23 05/17/2006, 04/16/2013  . Td 03/29/1999  . Tdap 04/16/2013   Required Immunizations needed today  Screening test up to date or reviewed for plan of completion Health Maintenance Due  Topic Date Due  . ZOSTAVAX  09/05/2001   Educated to check with insurance regarding coverage of Shingles vaccination on Part D or Part B and may have lower co-pay if  provided on the Part D side Educated regarding the new shingles vaccine   ' Cardiac Risk Factors include: advanced age (>70men, >33 women);sedentary lifestyle;smoking/ tobacco exposure (has stopped now)     Objective:     Vitals: BP 112/80   Pulse 91   Ht 5\' 4"  (1.626 m)   Wt 130 lb 3 oz (59.1 kg)   SpO2 96%   BMI 22.35 kg/m   Body mass index is 22.35 kg/m.   Tobacco History  Smoking Status  . Former Smoker  . Packs/day: 2.50  . Years: 50.00  . Quit date: 11/01/2009  Smokeless Tobacco  . Never Used     Counseling given: Yes   Past Medical History:  Diagnosis Date  . Arthritis   . Cerebrovascular disease   . Chronic diastolic CHF (congestive heart failure) ([place]) 02/23/2016  . COPD (chronic obstructive pulmonary disease) ([place])   . Depression   . Diverticulosis   . Esophageal stricture   . History of pneumonia   . History of stroke    mild oncoming  . 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 Hyperlipidemia   . Hypothyroidism   . Internal hemorrhoids   . Osteoporosis   . Pneumonia    HX of  . Positive ANA (antinuclear antibody)   . Pulmonary nodule   . Rectal ulcer   . Shortness of breath    periodically with COPD flair up  . 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[facility])    Past Surgical History:  Procedure Laterality Date  . BLADDER SURGERY     tack  . CATARACT EXTRACTION     bilateral  . COLONOSCOPY  06/22/2011   Procedure: COLONOSCOPY;  Surgeon: Lafayette Dragon, MD;  Location: [facility] ENDOSCOPY;  Service: Endoscopy;  Laterality: N/A;  . GASTRIC VARICES BANDING  06/22/2011   Procedure: HEMORRHOID BANDING;  Surgeon: Lafayette Dragon, MD;  Location: [facility] ENDOSCOPY;  Service: Endoscopy;  Laterality: N/A;  . HEMICOLECTOMY  2012   emergent surgery [facility]  . HIATAL HERNIA REPAIR     x 2  . TONSILLECTOMY    . TOTAL ABDOMINAL HYSTERECTOMY W/ BILATERAL SALPINGOOPHORECTOMY  1998   Family History  Problem Relation Age of Onset  . Colon cancer Mother   .  COPD Father   . Heart attack Father   . Stroke Maternal Aunt   . Colon polyps Brother   . Liver disease Maternal Aunt   . Breast cancer Maternal Aunt   . Hypertension Son   . Hypothyroidism Daughter    History  Sexual Activity  . Sexual activity: Not Currently    Outpatient Encounter Prescriptions as of 07/26/2016  Medication Sig  . albuterol (PROVENTIL HFA;VENTOLIN HFA) 108 (90 BASE) MCG/ACT inhaler Inhale 2 puffs into the lungs every 6 (six) hours as needed for wheezing or shortness of breath.  Marland Kitchen albuterol (PROVENTIL) (2.5 MG/3ML) 0.083% nebulizer solution Use 1/2 vial of ALBUTEROL in NEBULIZER three times daily...  . alendronate (  FOSAMAX) 70 MG tablet Take 1 tablet (70 mg total) by mouth once a week. Take with a full glass of water on an empty stomach first thing in the morning.  Marland Kitchen ALPRAZolam (XANAX) 0.5 MG tablet Take 1 tablet (0.5 mg total) by mouth 3 (three) times daily as needed for anxiety or sleep (air hunger. dyspnea).  . budesonide-formoterol (SYMBICORT) 160-4.5 MCG/ACT inhaler Inhale 2 puffs into the lungs 2 (two) times daily.  . furosemide (LASIX) 20 MG tablet Take 1 tablet (20 mg total) by mouth daily as needed for fluid or edema.  . gabapentin (NEURONTIN) 300 MG capsule Take 1 capsule (300 mg total) by mouth 2 (two) times daily.  Marland Kitchen levothyroxine (SYNTHROID) 75 MCG tablet Take 1 tablet (75 mcg total) by mouth daily before breakfast.  . methylPREDNISolone (MEDROL) 8 MG tablet Take 1 daily x 14 days then 1/2 tablet daily thereafter  . mirtazapine (REMERON) 30 MG tablet Take 1 tablet (30 mg total) by mouth at bedtime.  Marland Kitchen oxyCODONE-acetaminophen (PERCOCET) 5-325 MG tablet Take 1 to 2 tablets by mouth every 6 hours as need for pain  . simvastatin (ZOCOR) 10 MG tablet Take 1 tablet (10 mg total) by mouth at bedtime.  Marland Kitchen umeclidinium bromide (INCRUSE ELLIPTA) 62.5 MCG/INH AEPB Inhale 1 puff into the lungs daily.  . Vitamin D, Ergocalciferol, (DRISDOL) 50000 units CAPS capsule Take 1  capsule (50,000 Units total) by mouth every 7 (seven) days.  . halobetasol (ULTRAVATE) 0.05 % ointment Apply topically 2 (two) times daily.    Marland Kitchen morphine (ROXANOL) 20 MG/ML concentrated solution Take 0.5 mLs (10 mg total) by mouth every 2 (two) hours as needed for moderate pain, severe pain, anxiety or shortness of breath. (Patient not taking: Reported on 07/26/2016)  . [DISCONTINUED] levofloxacin (LEVAQUIN) 500 MG tablet Take 1 tablet (500 mg total) by mouth daily. (Patient not taking: Reported on 07/26/2016)   No facility-administered encounter medications on file as of 07/26/2016.     Activities of Daily Living In your present state of health, do you have any difficulty performing the following activities: 07/26/2016  Hearing? Y  Vision? (No Data)  Difficulty concentrating or making decisions? N  Walking or climbing stairs? Y  Dressing or bathing? N  Doing errands, shopping? N  Preparing Food and eating ? N  Using the Toilet? N  In the past six months, have you accidently leaked urine? N  Do you have problems with loss of bowel control? N  Managing your Medications? N  Managing your Finances? N  Housekeeping or managing your Housekeeping? N  Some recent data might be hidden    Patient Care Team: Dorothyann Peng, NP as PCP - General (Family Medicine)    Assessment:     Exercise Activities and Dietary recommendations Current Exercise Habits: Home exercise routine, Type of exercise: walking;strength training/weights, Time (Minutes): 20, Intensity: Mild  Goals    . Exercise 150 minutes per week (moderate activity)          Exercise routinely; getting up and down from chair multiple times an hour Marching in place when not cleaning the home Cans of soup to exercise your arms     . Gain weight    . Prevent Falls      Fall Risk Fall Risk  07/26/2016 02/17/2015 12/15/2014 12/15/2014 12/15/2014  Falls in the past year? Yes Yes - Yes Yes  Number falls in past yr: 1 2 or more - 2 or  more 2 or more  Injury with  Fall? - Yes No - Yes  Risk Factor Category  - High Fall Risk High Fall Risk - -  Risk for fall due to : - History of fall(s);Impaired mobility - - History of fall(s)  Follow up Education provided - Falls evaluation completed;Education provided;Falls prevention discussed;Follow up appointment - -   Depression Screen PHQ 2/9 Scores 07/26/2016 02/10/2016 02/17/2015 12/15/2014  PHQ - 2 Score 0 0 0 6  PHQ- 9 Score - - - 18     Cognitive Function MMSE - Mini Mental State Exam 07/26/2016  Not completed: (No Data)    no memory issues noted today Judgement and decision making intact [place] Born and assisting with management of daily needs     Immunization History  Administered Date(s) Administered  . H1N1 06/30/2008  . Influenza Split 04/18/2012  . Influenza Whole 06/10/2007, 04/14/2010, 04/18/2012  . Influenza, High Dose Seasonal PF 06/02/2015, 04/10/2016  . Influenza,inj,Quad PF,36+ Mos 04/16/2013  . Pneumococcal Conjugate-13 06/02/2015  . Pneumococcal Polysaccharide-23 05/17/2006, 04/16/2013  . Td 03/29/1999  . Tdap 04/16/2013   Screening Tests Health Maintenance  Topic Date Due  . ZOSTAVAX  09/05/2001  . MAMMOGRAM  02/16/2017  . COLONOSCOPY  06/21/2021  . TETANUS/TDAP  04/17/2023  . INFLUENZA VACCINE  Completed  . DEXA SCAN  Completed  . PNA vac Low Risk Adult  Completed      Plan:   Over due maintenance Zostavax; Educated on zoster and new shingles vaccine coming out this year.  meds needed refilled and had planned to see Tommi Rumps today Did make apt to see Tommi Rumps on Friday Also renewed maintenance meds as requested per [place] Vit d 50,000; q week Simvastatin 10mg  Levothyroxine  Alprazolam ordered by Tommi Rumps and printed These were sent to confirmed pharmacy with same rx;   Continues to have issues sleeping. Has stopped smoking since hospitalization last year;  Is taking Xanax 3 times per day  To schedule mammogram Heidi [name] to call GI to schedule  repeat colonoscopy  Hearing exam when time To scheduled eye exam with Dr. Gershon Crane  Overall, starting to thrive; gaining weight; eating well Still had issues with children;   During the course of the visit the patient was educated and counseled about the following appropriate screening and preventive services:   Vaccines to include Pneumoccal, Influenza, Hepatitis B, Td, Zostavax, HCV  Electrocardiogram  Cardiovascular Disease  Colorectal cancer screening to be scheduled   Bone density screening  Diabetes screening  Glaucoma screening to schedule  Mammography/PAP  Nutrition counseling   Patient Instructions (the written plan) was given to the patient.   Wynetta Fines, RN  07/26/2016

## 2016-07-26 NOTE — Patient Instructions (Addendum)
Ms. Heidi [name] , Thank you for taking time to come for your Medicare Wellness Visit. I appreciate your ongoing commitment to your health goals. Please review the following plan we discussed and let me know if I can assist you in the future.   Will schedule mammgram  Will fup with GI for colonoscopy  Will refer to hearing exam   Will have vision checked soon  / annually Dr. Dominga Ferry to check with insurance regarding coverage of Shingles vaccination on Part D or Part B and may have lower co-pay if provided on the Part D side/     [employer]; [phone] [facility]; [phone] Get resource to get information on any and all community programs for Seniors  [place]: [phone] Community [facility] Response Program -[phone] Public Health Dept; Need to be a skilled visit but can assist with bathing as well; [phone]  Adult center for [facility];  Call [facility]; [phone]  Adult day services include Adult Day Care, Adult Day Healthcare, [facility], [facility], [facility] [employer], Education and [place] manages the community Alternatives program at the [employer]; [phone] (this is a program with a waiting list but provides SNF care at home;  [name] required; Call Claiborne Billings and she will send out packet of information    [facility] - can assist with hearing aid x 1  No reviews  [facility]  [address]  [phone]     These are the goals we discussed: Goals    . Exercise 150 minutes per week (moderate activity)          Exercise routinely; getting up and down from chair multiple times an hour Marching in place when not cleaning the home Cans of soup to exercise your arms     . Gain weight    . Prevent Falls       This is a list of the screening recommended for you and due dates:  Health Maintenance  Topic Date Due  . Shingles Vaccine  09/05/2001  .  Mammogram  02/16/2017  . Colon Cancer Screening  06/21/2021  . Tetanus Vaccine  04/17/2023  . Flu Shot  Completed  . DEXA scan (bone density measurement)  Completed  . Pneumonia vaccines  Completed        Fall Prevention in the Home Introduction Falls can cause injuries. They can happen to people of all ages. There are many things you can do to make your home safe and to help prevent falls. What can I do on the outside of my home?  Regularly fix the edges of walkways and driveways and fix any cracks.  Remove anything that might make you trip as you walk through a door, such as a raised step or threshold.  Trim any bushes or trees on the path to your home.  Use bright outdoor lighting.  Clear any walking paths of anything that might make someone trip, such as rocks or tools.  Regularly check to see if handrails are loose or broken. Make sure that both sides of any steps have handrails.  Any raised decks and porches should have guardrails on the edges.  Have any leaves, snow, or ice cleared regularly.  Use sand or salt on walking paths during winter.  Clean up any spills in your garage right away. This includes oil or grease spills. What can I do in the bathroom?  Use night lights.  Install grab bars by the toilet and in the tub and shower. Do not use towel bars as grab bars.  Use non-skid mats or decals in the tub or shower.  If you need to sit down in the shower, use a plastic, non-slip stool.  Keep the floor dry. Clean up any water that spills on the floor as soon as it happens.  Remove soap buildup in the tub or shower regularly.  Attach bath mats securely with double-sided non-slip rug tape.  Do not have throw rugs and other things on the floor that can make you trip. What can I do in the bedroom?  Use night lights.  Make sure that you have a light by your bed that is easy to reach.  Do not use any sheets or blankets that are too big for your bed. They  should not hang down onto the floor.  Have a firm chair that has side arms. You can use this for support while you get dressed.  Do not have throw rugs and other things on the floor that can make you trip. What can I do in the kitchen?  Clean up any spills right away.  Avoid walking on wet floors.  Keep items that you use a lot in easy-to-reach places.  If you need to reach something above you, use a strong step stool that has a grab bar.  Keep electrical cords out of the way.  Do not use floor polish or wax that makes floors slippery. If you must use wax, use non-skid floor wax.  Do not have throw rugs and other things on the floor that can make you trip. What can I do with my stairs?  Do not leave any items on the stairs.  Make sure that there are handrails on both sides of the stairs and use them. Fix handrails that are broken or loose. Make sure that handrails are as long as the stairways.  Check any carpeting to make sure that it is firmly attached to the stairs. Fix any carpet that is loose or worn.  Avoid having throw rugs at the top or bottom of the stairs. If you do have throw rugs, attach them to the floor with carpet tape.  Make sure that you have a light switch at the top of the stairs and the bottom of the stairs. If you do not have them, ask someone to add them for you. What else can I do to help prevent falls?  Wear shoes that:  Do not have high heels.  Have rubber bottoms.  Are comfortable and fit you well.  Are closed at the toe. Do not wear sandals.  If you use a stepladder:  Make sure that it is fully opened. Do not climb a closed stepladder.  Make sure that both sides of the stepladder are locked into place.  Ask someone to hold it for you, if possible.  Clearly mark and make sure that you can see:  Any grab bars or handrails.  First and last steps.  Where the edge of each step is.  Use tools that help you move around (mobility aids) if  they are needed. These include:  Canes.  Walkers.  Scooters.  Crutches.  Turn on the lights when you go into a dark area. Replace any light bulbs as soon as they burn out.  Set up your furniture so you have a clear path. Avoid moving your furniture around.  If any  of your floors are uneven, fix them.  If there are any pets around you, be aware of where they are.  Review your medicines with your doctor. Some medicines can make you feel dizzy. This can increase your chance of falling. Ask your doctor what other things that you can do to help prevent falls. This information is not intended to replace advice given to you by your health care provider. Make sure you discuss any questions you have with your health care provider. Document Released: 04/29/2009 Document Revised: 12/09/2015 Document Reviewed: 08/07/2014  2017 Elsevier  Health Maintenance, Female Introduction Adopting a healthy lifestyle and getting preventive care can go a long way to promote health and wellness. Talk with your health care provider about what schedule of regular examinations is right for you. This is a good chance for you to check in with your provider about disease prevention and staying healthy. In between checkups, there are plenty of things you can do on your own. Experts have done a lot of research about which lifestyle changes and preventive measures are most likely to keep you healthy. Ask your health care provider for more information. Weight and diet Eat a healthy diet  Be sure to include plenty of vegetables, fruits, low-fat dairy products, and lean protein.  Do not eat a lot of foods high in solid fats, added sugars, or salt.  Get regular exercise. This is one of the most important things you can do for your health.  Most adults should exercise for at least 150 minutes each week. The exercise should increase your heart rate and make you sweat (moderate-intensity exercise).  Most adults should  also do strengthening exercises at least twice a week. This is in addition to the moderate-intensity exercise. Maintain a healthy weight  Body mass index (BMI) is a measurement that can be used to identify possible weight problems. It estimates body fat based on height and weight. Your health care provider can help determine your BMI and help you achieve or maintain a healthy weight.  For females 36 years of age and older:  A BMI below 18.5 is considered underweight.  A BMI of 18.5 to 24.9 is normal.  A BMI of 25 to 29.9 is considered overweight.  A BMI of 30 and above is considered obese. Watch levels of cholesterol and blood lipids  You should start having your blood tested for lipids and cholesterol at 75 years of age, then have this test every 5 years.  You may need to have your cholesterol levels checked more often if:  Your lipid or cholesterol levels are high.  You are older than 75 years of age.  You are at high risk for heart disease. Cancer screening Lung Cancer  Lung cancer screening is recommended for adults 88-15 years old who are at high risk for lung cancer because of a history of smoking.  A yearly low-dose CT scan of the lungs is recommended for people who:  Currently smoke.  Have quit within the past 15 years.  Have at least a 30-pack-year history of smoking. A pack year is smoking an average of one pack of cigarettes a day for 1 year.  Yearly screening should continue until it has been 15 years since you quit.  Yearly screening should stop if you develop a health problem that would prevent you from having lung cancer treatment. Breast Cancer  Practice breast self-awareness. This means understanding how your breasts normally appear and feel.  It also means doing  regular breast self-exams. Let your health care provider know about any changes, no matter how small.  If you are in your 20s or 30s, you should have a clinical breast exam (CBE) by a health  care provider every 1-3 years as part of a regular health exam.  If you are 81 or older, have a CBE every year. Also consider having a breast X-ray (mammogram) every year.  If you have a family history of breast cancer, talk to your health care provider about genetic screening.  If you are at high risk for breast cancer, talk to your health care provider about having an MRI and a mammogram every year.  Breast cancer gene (BRCA) assessment is recommended for women who have family members with BRCA-related cancers. BRCA-related cancers include:  Breast.  Ovarian.  Tubal.  Peritoneal cancers.  Results of the assessment will determine the need for genetic counseling and BRCA1 and BRCA2 testing. Cervical Cancer  Your health care provider may recommend that you be screened regularly for cancer of the pelvic organs (ovaries, uterus, and vagina). This screening involves a pelvic examination, including checking for microscopic changes to the surface of your cervix (Pap test). You may be encouraged to have this screening done every 3 years, beginning at age 73.  For women ages 69-65, health care providers may recommend pelvic exams and Pap testing every 3 years, or they may recommend the Pap and pelvic exam, combined with testing for human papilloma virus (HPV), every 5 years. Some types of HPV increase your risk of cervical cancer. Testing for HPV may also be done on women of any age with unclear Pap test results.  Other health care providers may not recommend any screening for nonpregnant women who are considered low risk for pelvic cancer and who do not have symptoms. Ask your health care provider if a screening pelvic exam is right for you.  If you have had past treatment for cervical cancer or a condition that could lead to cancer, you need Pap tests and screening for cancer for at least 20 years after your treatment. If Pap tests have been discontinued, your risk factors (such as having a new  sexual partner) need to be reassessed to determine if screening should resume. Some women have medical problems that increase the chance of getting cervical cancer. In these cases, your health care provider may recommend more frequent screening and Pap tests. Colorectal Cancer  This type of cancer can be detected and often prevented.  Routine colorectal cancer screening usually begins at 75 years of age and continues through 75 years of age.  Your health care provider may recommend screening at an earlier age if you have risk factors for colon cancer.  Your health care provider may also recommend using home test kits to check for hidden blood in the stool.  A small camera at the end of a tube can be used to examine your colon directly (sigmoidoscopy or colonoscopy). This is done to check for the earliest forms of colorectal cancer.  Routine screening usually begins at age 19.  Direct examination of the colon should be repeated every 5-10 years through 75 years of age. However, you may need to be screened more often if early forms of precancerous polyps or small growths are found. Skin Cancer  Check your skin from head to toe regularly.  Tell your health care provider about any new moles or changes in moles, especially if there is a change in a mole's shape or  color.  Also tell your health care provider if you have a mole that is larger than the size of a pencil eraser.  Always use sunscreen. Apply sunscreen liberally and repeatedly throughout the day.  Protect yourself by wearing long sleeves, pants, a wide-brimmed hat, and sunglasses whenever you are outside. Heart disease, diabetes, and high blood pressure  High blood pressure causes heart disease and increases the risk of stroke. High blood pressure is more likely to develop in:  People who have blood pressure in the high end of the normal range (130-139/85-89 mm Hg).  People who are overweight or obese.  People who are African  American.  If you are 89-64 years of age, have your blood pressure checked every 3-5 years. If you are 63 years of age or older, have your blood pressure checked every year. You should have your blood pressure measured twice-once when you are at a hospital or clinic, and once when you are not at a hospital or clinic. Record the average of the two measurements. To check your blood pressure when you are not at a hospital or clinic, you can use:  An automated blood pressure machine at a pharmacy.  A home blood pressure monitor.  If you are between 16 years and 28 years old, ask your health care provider if you should take aspirin to prevent strokes.  Have regular diabetes screenings. This involves taking a blood sample to check your fasting blood sugar level.  If you are at a normal weight and have a low risk for diabetes, have this test once every three years after 75 years of age.  If you are overweight and have a high risk for diabetes, consider being tested at a younger age or more often. Preventing infection Hepatitis B  If you have a higher risk for hepatitis B, you should be screened for this virus. You are considered at high risk for hepatitis B if:  You were born in a country where hepatitis B is common. Ask your health care provider which countries are considered high risk.  Your parents were born in a high-risk country, and you have not been immunized against hepatitis B (hepatitis B vaccine).  You have HIV or AIDS.  You use needles to inject street drugs.  You live with someone who has hepatitis B.  You have had sex with someone who has hepatitis B.  You get hemodialysis treatment.  You take certain medicines for conditions, including cancer, organ transplantation, and autoimmune conditions. Hepatitis C  Blood testing is recommended for:  Everyone born from [birth year] through 1964.  Anyone with known risk factors for hepatitis C. Sexually transmitted infections  (STIs)  You should be screened for sexually transmitted infections (STIs) including gonorrhea and chlamydia if:  You are sexually active and are younger than 75 years of age.  You are older than 75 years of age and your health care provider tells you that you are at risk for this type of infection.  Your sexual activity has changed since you were last screened and you are at an increased risk for chlamydia or gonorrhea. Ask your health care provider if you are at risk.  If you do not have HIV, but are at risk, it may be recommended that you take a prescription medicine daily to prevent HIV infection. This is called pre-exposure prophylaxis (PrEP). You are considered at risk if:  You are sexually active and do not regularly use condoms or know the HIV status of your  partner(s).  You take drugs by injection.  You are sexually active with a partner who has HIV. Talk with your health care provider about whether you are at high risk of being infected with HIV. If you choose to begin PrEP, you should first be tested for HIV. You should then be tested every 3 months for as long as you are taking PrEP. Pregnancy  If you are premenopausal and you may become pregnant, ask your health care provider about preconception counseling.  If you may become pregnant, take 400 to 800 micrograms (mcg) of folic acid every day.  If you want to prevent pregnancy, talk to your health care provider about birth control (contraception). Osteoporosis and menopause  Osteoporosis is a disease in which the bones lose minerals and strength with aging. This can result in serious bone fractures. Your risk for osteoporosis can be identified using a bone density scan.  If you are 60 years of age or older, or if you are at risk for osteoporosis and fractures, ask your health care provider if you should be screened.  Ask your health care provider whether you should take a calcium or vitamin D supplement to lower your risk  for osteoporosis.  Menopause may have certain physical symptoms and risks.  Hormone replacement therapy may reduce some of these symptoms and risks. Talk to your health care provider about whether hormone replacement therapy is right for you. Follow these instructions at home:  Schedule regular health, dental, and eye exams.  Stay current with your immunizations.  Do not use any tobacco products including cigarettes, chewing tobacco, or electronic cigarettes.  If you are pregnant, do not drink alcohol.  If you are breastfeeding, limit how much and how often you drink alcohol.  Limit alcohol intake to no more than 1 drink per day for nonpregnant women. One drink equals 12 ounces of beer, 5 ounces of wine, or 1 ounces of hard liquor.  Do not use street drugs.  Do not share needles.  Ask your health care provider for help if you need support or information about quitting drugs.  Tell your health care provider if you often feel depressed.  Tell your health care provider if you have ever been abused or do not feel safe at home. This information is not intended to replace advice given to you by your health care provider. Make sure you discuss any questions you have with your health care provider. Document Released: 01/16/2011 Document Revised: 12/09/2015 Document Reviewed: 04/06/2015  2017 Elsevier   Hearing Loss Introduction Hearing loss is a partial or total loss of the ability to hear. This can be temporary or permanent, and it can happen in one or both ears. Hearing loss may be referred to as deafness. Medical care is necessary to treat hearing loss properly and to prevent the condition from getting worse. Your hearing may partially or completely come back, depending on what caused your hearing loss and how severe it is. In some cases, hearing loss is permanent. What are the causes? Common causes of hearing loss include:  Too much wax in the ear canal.  Infection of the ear  canal or middle ear.  Fluid in the middle ear.  Injury to the ear or surrounding area.  An object stuck in the ear.  Prolonged exposure to loud sounds, such as music. Less common causes of hearing loss include:  Tumors in the ear.  Viral or bacterial infections, such as meningitis.  A hole in the eardrum (  perforated eardrum).  Problems with the hearing nerve that sends signals between the brain and the ear.  Certain medicines. What are the signs or symptoms? Symptoms of this condition may include:  Difficulty telling the difference between sounds.  Difficulty following a conversation when there is background noise.  Lack of response to sounds in your environment. This may be most noticeable when you do not respond to startling sounds.  Needing to turn up the volume on the television, radio, etc.  Ringing in the ears.  Dizziness.  Pain in the ears. How is this diagnosed? This condition is diagnosed based on a physical exam and a hearing test (audiometry). The audiometry test will be performed by a hearing specialist (audiologist). You may also be referred to an ear, nose, and throat (ENT) specialist (otolaryngologist). How is this treated? Treatment for recent onset of hearing loss may include:  Ear wax removal.  Being prescribed medicines to prevent infection (antibiotics).  Being prescribed medicines to reduce inflammation (corticosteroids). Follow these instructions at home:  If you were prescribed an antibiotic medicine, take it as told by your health care provider. Do not stop taking the antibiotic even if you start to feel better.  Take over-the-counter and prescription medicines only as told by your health care provider.  Avoid loud noises.  Return to your normal activities as told by your health care provider. Ask your health care provider what activities are safe for you.  Keep all follow-up visits as told by your health care provider. This is  important. Contact a health care provider if:  You feel dizzy.  You develop new symptoms.  You vomit or feel nauseous.  You have a fever. Get help right away if:  You develop sudden changes in your vision.  You have severe ear pain.  You have new or increased weakness.  You have a severe headache. This information is not intended to replace advice given to you by your health care provider. Make sure you discuss any questions you have with your health care provider. Document Released: 07/03/2005 Document Revised: 12/09/2015 Document Reviewed: 11/18/2014  2017 [employer]

## 2016-07-26 NOTE — Progress Notes (Signed)
I have reviewed and agree with this note  

## 2016-07-28 ENCOUNTER — Encounter: Payer: Self-pay | Admitting: Adult Health

## 2016-07-28 ENCOUNTER — Ambulatory Visit (INDEPENDENT_AMBULATORY_CARE_PROVIDER_SITE_OTHER): Payer: Medicare Other | Admitting: Adult Health

## 2016-07-28 VITALS — BP 140/78 | Ht 64.0 in | Wt 127.7 lb

## 2016-07-28 DIAGNOSIS — G4700 Insomnia, unspecified: Secondary | ICD-10-CM

## 2016-07-28 NOTE — Progress Notes (Signed)
Subjective:    Patient ID: Heidi [name], female    DOB: 12-25-1940, 76 y.o.   MRN: [id number]  HPI  76 year old female who  has a past medical history of Arthritis; Cerebrovascular disease; Chronic diastolic CHF (congestive heart failure) ([place]) (02/23/2016); COPD (chronic obstructive pulmonary disease) ([place]); Depression; Diverticulosis; Esophageal stricture; History of pneumonia; History of stroke; adenomatous colonic polyps; Hyperlipidemia; Hypothyroidism; Internal hemorrhoids; Osteoporosis; Pneumonia; Positive ANA (antinuclear antibody); Pulmonary nodule; Rectal ulcer; Shortness of breath; and 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([place]).   She presents today with her daughter-in- law for routine follow up appointment. I last saw her in July 2017. She reports that she has been doing well over all since our last appointment. Per notes she has been seen by Dr. Lenna [name] multiple times since I last saw Heidi [name].   Today in the office she reports that she is up to 130 pounds, as she continues to have a good appetite.   Her biggest complaint is that of chronic insomnia. She continues to take Remeron 30 mg as an appetite stimulate and sleep aid. She reports that in the past that she did well with Remeron for insomnia but as of recently it feels as though it is not working as well.   She would like to try restoril as she has tried this in the past and had good results with it.    Review of Systems  Constitutional: Negative.   Respiratory: Positive for shortness of breath (occassional ).   Cardiovascular: Negative.   Musculoskeletal: Positive for arthralgias, back pain and gait problem.  Skin: Negative.   All other systems reviewed and are negative.  Past Medical History:  Diagnosis Date  . Arthritis   . Cerebrovascular disease   . Chronic diastolic CHF (congestive heart failure) ([place]) 02/23/2016  . COPD (chronic obstructive pulmonary disease) ([place])   . Depression   . Diverticulosis   . Esophageal stricture   .  History of pneumonia   . History of stroke    mild oncoming  . 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 Hyperlipidemia   . Hypothyroidism   . Internal hemorrhoids   . Osteoporosis   . Pneumonia    HX of  . Positive ANA (antinuclear antibody)   . Pulmonary nodule   . Rectal ulcer   . Shortness of breath    periodically with COPD flair up  . 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[facility])     Social History   Social History  . Marital status: Widowed    Spouse name: N/A  . Number of children: 3  . Years of education: N/A   Occupational History  . RETIRED Retired   Social History Main Topics  . Smoking status: Former Smoker    Packs/day: 2.50    Years: 50.00    Quit date: 11/01/2009  . Smokeless tobacco: Never Used  . Alcohol use No  . Drug use: No  . Sexual activity: Not Currently   Other Topics Concern  . Not on file   Social History Narrative   Patient has no recent travel. She is smoking 2 packs per day and has smoked since age of 25. Has cats and dogs at home. No bird or mold exposure.       dtr with temporary     Past Surgical History:  Procedure Laterality Date  . BLADDER SURGERY     tack  . CATARACT EXTRACTION     bilateral  . COLONOSCOPY  06/22/2011   Procedure: COLONOSCOPY;  Surgeon: Lafayette Dragon, MD;  Location: [facility];  Service: Endoscopy;  Laterality: N/A;  . GASTRIC VARICES BANDING  06/22/2011   Procedure: HEMORRHOID BANDING;  Surgeon: Lafayette Dragon, MD;  Location: [facility] ENDOSCOPY;  Service: Endoscopy;  Laterality: N/A;  . HEMICOLECTOMY  2012   emergent surgery [facility]  . HIATAL HERNIA REPAIR     x 2  . TONSILLECTOMY    . TOTAL ABDOMINAL HYSTERECTOMY W/ BILATERAL SALPINGOOPHORECTOMY  1998    Family History  Problem Relation Age of Onset  . Colon cancer Mother   . COPD Father   . Heart attack Father   . Stroke Maternal Aunt   . Colon polyps Brother   . Liver disease Maternal Aunt   . Breast cancer Maternal Aunt   .  Hypertension Son   . Hypothyroidism Daughter     Allergies  Allergen Reactions  . Morphine And Related Nausea And Vomiting  . Penicillins Hives    Has patient had a PCN reaction causing immediate rash, facial/tongue/throat swelling, SOB or lightheadedness with hypotension: No Has patient had a PCN reaction causing severe rash involving mucus membranes or skin necrosis: No Has patient had a PCN reaction that required hospitalization No Has patient had a PCN reaction occurring within the last 10 years: No If all of the above answers are "NO", then may proceed with Cephalosporin use.    Current Outpatient Prescriptions on File Prior to Visit  Medication Sig Dispense Refill  . albuterol (PROVENTIL HFA;VENTOLIN HFA) 108 (90 BASE) MCG/ACT inhaler Inhale 2 puffs into the lungs every 6 (six) hours as needed for wheezing or shortness of breath. 1 Inhaler 0  . albuterol (PROVENTIL) (2.5 MG/3ML) 0.083% nebulizer solution Use 1/2 vial of ALBUTEROL in NEBULIZER three times daily... 150 mL 0  . alendronate (FOSAMAX) 70 MG tablet Take 1 tablet (70 mg total) by mouth once a week. Take with a full glass of water on an empty stomach first thing in the morning. 12 tablet 3  . ALPRAZolam (XANAX) 0.5 MG tablet Take 1 tablet (0.5 mg total) by mouth 3 (three) times daily as needed for anxiety or sleep (air hunger. dyspnea). 30 tablet 0  . budesonide-formoterol (SYMBICORT) 160-4.5 MCG/ACT inhaler Inhale 2 puffs into the lungs 2 (two) times daily. 1 Inhaler 6  . furosemide (LASIX) 20 MG tablet Take 1 tablet (20 mg total) by mouth daily as needed for fluid or edema. 30 tablet 0  . gabapentin (NEURONTIN) 300 MG capsule Take 1 capsule (300 mg total) by mouth 2 (two) times daily. 60 capsule 2  . halobetasol (ULTRAVATE) 0.05 % ointment Apply topically 2 (two) times daily.      Marland Kitchen levothyroxine (SYNTHROID) 75 MCG tablet Take 1 tablet (75 mcg total) by mouth daily before breakfast. 90 tablet 3  . methylPREDNISolone  (MEDROL) 8 MG tablet Take 1 daily x 14 days then 1/2 tablet daily thereafter 30 tablet 6  . mirtazapine (REMERON) 30 MG tablet Take 1 tablet (30 mg total) by mouth at bedtime. 90 tablet 0  . morphine (ROXANOL) 20 MG/ML concentrated solution Take 0.5 mLs (10 mg total) by mouth every 2 (two) hours as needed for moderate pain, severe pain, anxiety or shortness of breath. 30 mL 0  . oxyCODONE-acetaminophen (PERCOCET) 5-325 MG tablet Take 1 to 2 tablets by mouth every 6 hours as need for pain 30 tablet 0  . simvastatin (ZOCOR) 10 MG tablet Take 1 tablet (10 mg total)  by mouth at bedtime. 90 tablet 3  . umeclidinium bromide (INCRUSE ELLIPTA) 62.5 MCG/INH AEPB Inhale 1 puff into the lungs daily. 1 each 6  . Vitamin D, Ergocalciferol, (DRISDOL) 50000 units CAPS capsule Take 1 capsule (50,000 Units total) by mouth every 7 (seven) days. 12 capsule 3   No current facility-administered medications on file prior to visit.     BP 140/78   Ht 5\' 4"  (1.626 m)   Wt 127 lb 11.2 oz (57.9 kg)   BMI 21.92 kg/m       Objective:   Physical Exam  Constitutional: She is oriented to person, place, and time. She appears well-developed and well-nourished. No distress.  Cardiovascular: Normal rate, regular rhythm, normal heart sounds and intact distal pulses.  Exam reveals no gallop and no friction rub.   No murmur heard. Pulmonary/Chest: Effort normal. No respiratory distress. She has decreased breath sounds. She has wheezes. She has no rhonchi. She has no rales. She exhibits no tenderness.  Musculoskeletal: She exhibits tenderness. She exhibits no edema or deformity.  Neurological: She is alert and oriented to person, place, and time.  Skin: Skin is warm and dry. No rash noted. She is not diaphoretic. No erythema. No pallor.  Psychiatric: She has a normal mood and affect. Her behavior is normal. Judgment and thought content normal.  Nursing note and vitals reviewed.     Assessment & Plan:  1. Insomnia,  unspecified type - Spoke to [place] and her caregiver about sleep medications at length. I do not feel comfortable prescribing her another benzo to help with insomnia. She is already taking Xanax. She is not great with self medication and I feel this would be an unsafe combination for [place]. I am ok with her taking 12.5 mg Benadryl but I do not want her taking Remeron if she is going to do this.  - Follow up if no improvement.   Dorothyann Peng, NP

## 2016-08-06 ENCOUNTER — Other Ambulatory Visit: Payer: Self-pay | Admitting: Adult Health

## 2016-08-07 NOTE — Telephone Encounter (Signed)
Ok to refill for one month  

## 2016-08-09 ENCOUNTER — Telehealth: Payer: Self-pay

## 2016-08-09 ENCOUNTER — Other Ambulatory Visit: Payer: Self-pay | Admitting: Adult Health

## 2016-08-09 DIAGNOSIS — F329 Major depressive disorder, single episode, unspecified: Secondary | ICD-10-CM

## 2016-08-09 DIAGNOSIS — F32A Depression, unspecified: Secondary | ICD-10-CM

## 2016-08-09 DIAGNOSIS — G4700 Insomnia, unspecified: Secondary | ICD-10-CM

## 2016-08-09 MED ORDER — MIRTAZAPINE 30 MG PO TABS: 30.0000 mg | ORAL_TABLET | Freq: Every day | ORAL | 1 refills | Status: DC

## 2016-08-09 NOTE — Telephone Encounter (Signed)
I received a call from [name] stating that patient had threw all of her sleeping medications away, and that she needs refills. Heidi [name] was unsure of the names of the medications. Please advise.

## 2016-08-09 NOTE — Telephone Encounter (Signed)
Remeron sent into pharmacy. I spoke with [name] and she told me that Alyzae was out of this medication because she accidentally through the bottle in the trash

## 2016-08-14 ENCOUNTER — Encounter: Payer: Self-pay | Admitting: Pulmonary Disease

## 2016-08-14 ENCOUNTER — Ambulatory Visit (INDEPENDENT_AMBULATORY_CARE_PROVIDER_SITE_OTHER): Payer: Medicare Other | Admitting: Pulmonary Disease

## 2016-08-14 VITALS — BP 112/64 | HR 73 | Ht 64.5 in | Wt 131.8 lb

## 2016-08-14 DIAGNOSIS — J449 Chronic obstructive pulmonary disease, unspecified: Secondary | ICD-10-CM

## 2016-08-14 DIAGNOSIS — E039 Hypothyroidism, unspecified: Secondary | ICD-10-CM

## 2016-08-14 DIAGNOSIS — G4700 Insomnia, unspecified: Secondary | ICD-10-CM

## 2016-08-14 DIAGNOSIS — F32A Depression, unspecified: Secondary | ICD-10-CM

## 2016-08-14 DIAGNOSIS — F411 Generalized anxiety disorder: Secondary | ICD-10-CM

## 2016-08-14 DIAGNOSIS — I5032 Chronic diastolic (congestive) heart failure: Secondary | ICD-10-CM | POA: Diagnosis not present

## 2016-08-14 DIAGNOSIS — J9611 Chronic respiratory failure with hypoxia: Secondary | ICD-10-CM | POA: Diagnosis not present

## 2016-08-14 DIAGNOSIS — F329 Major depressive disorder, single episode, unspecified: Secondary | ICD-10-CM

## 2016-08-14 MED ORDER — MIRTAZAPINE 30 MG PO TABS: 30.0000 mg | ORAL_TABLET | Freq: Every day | ORAL | 5 refills | Status: DC

## 2016-08-14 MED ORDER — LEVOTHYROXINE SODIUM 75 MCG PO TABS: 75.0000 ug | ORAL_TABLET | Freq: Every day | ORAL | 5 refills | Status: DC

## 2016-08-14 MED ORDER — ALPRAZOLAM 0.5 MG PO TABS: 0.5000 mg | ORAL_TABLET | Freq: Two times a day (BID) | ORAL | 5 refills | Status: DC | PRN

## 2016-08-14 NOTE — Progress Notes (Signed)
Subjective:     Patient ID: Heidi [name], female   DOB: 07/31/1940, 75 y.o.   MRN: [id number]  HPI 75 y/o WF former smoker who quit 2016 w/ severe COPD/emphysema & chronic hypoxemic and hypercarbic resp failure, placed on Hospice by DrSwords/ DrBurchette in 2016, hx pneumonia and small bilat pulm nodules on CT Chest, and diastolic CHF...   ~  May 13, 2015:  Initial pulmonary consult by SN>        Heidi [name] was Hosp 10/3 - 04/27/15 by [facility] after presenting via EMS w/ cough, weakness, increased SOB; she was still smoking, not on home O2; found to have COPD exac & CAP w/ bibasilar opacities, acute on chronic hypoxemic & hypercarbic resp failure, & superimposed CHF w/ bilat L>R pleural effusions (this was tapped 10/9 yielding transudative fluid, neg cytology, neg culture)... She was seen by CCM- both MR & JN attended her in the hosp; Treated w/ Rocephin/ Zithromax, Solumedrol, Nebs, & brief stay in step-down unit on BiPap; disch on ADVAIR100-1spBid, SPIRIVA daily, PRED taper, NEBS w/ Albut... She was also treated for acute on chronic diastolic CHF- mod dil RV & reduced RV sys function (w/ preserved LVF) on 2DEcho & diuresed w/ Lasix but only disch on 20mg prn for edema... Hospice was consulted (DrGolding's note reviewed) and she has entered the Hospice program (she is a DNR, DNI) w/ a prognosis est <6mo & she was disch on ROXANOL 20mg/ml--take 1/2 ml Q2H as needed for pain, SOB, anxiety...  She last saw DrSwords 11/2013, she has established new PCP- [name] & DrBurchette- has post hosp f/u pending; Note> she cancelled 2 appts w/ DrMcQuaid recently...       Currently Heidi [name] is very stoic- states she's not having any breathing problems at the present time & credits the thoracentesis w/ her remarkable improvement; her last cigarette was 04/19/15 prior to her admission; she was disch on O2 at 4L/min & has an O2sat=89% on this in the office today (RA sat was 74%); she was unable to perform PFT today; we had  a frank discussion about her severe, end-stage COPD...       Epic records indicate that she was seen by DrWert in 2011> on Spiriva +/- Advair, stated she quit smoking 2011 on adm for COPD exac, PFT reported FEV1=1.32 (77%), FEV1/FVC ratio=50%, DLCO=70%; CXR showed COPD & biapical pleuroparenchymal scarring, CT Chest 4/11 showed ?mult pulm nodules ?early cavitation r/o MAI, cultures- only NTF, O2sat=95% on RA...  Medical Hx>   She had an annual wellness exam from her PCP 02/2015> COPD, Hx pulm nodule, Hx of pneumonia; Hypothyroidism; adenomatous colonic polyps; Hyperlipidemia; Sigmoid volvulus; Depression; Cerebrovascular disease; Positive ANA; Diverticulosis; Esophageal stricture; Arthritis; History of stroke; Internal hemorrhoids; and Rectal ulcer; protein-calorie malnutrition (she weighs 78 lbs...  EXAM shows Afeb, VSS, O2sat=89% on O2 at 4L/min; Wt=78#, 5'2"Tall, BMI=16;  Heent- neg, mallampati1;  Chest- decr BS bilat, scat rhonchi, & end-exp wheezing;  Heart- RR gr1/6 SEM w/o r/g;  Abd soft, neg;  Ext- w/o c/c/ tr edema...   Baseline CXR 12/31/14 showed hyperinflation c/w COPD, biapical pleuroparenchymal scarring, nipple shadows and T12 compression fx...  CT Chest 02/22/15 showed cardiomeg, atherosclerotic changes in Ao & coronaries, small right effusion; emhysema w/ several additional findings- RUL & lingular subpleural opacities, & 5mm LUL & RLL nodules; osteopenia and T7 +T12 compression fractures...   CXRs performed during the 04/2015 Admission showed cardiomeg, CHF w/ left effusion, COPD w/ interstitial edema & LLL opac => improved post thoracentesis   10/9 w/ 400cc removed...  ABGs during the 10/16 Hosp showed pCO2 in the 73-87 range w/ resp acidosis...   LABS 10/16 reviewed> CBC- ok w/ Hg=13 range;  Chems- note HCO3=44-45 range & she may benefit drom Acetazolamide added to her diuretic regimen if more aggressive treatment is desired...  IMP>> Tobin has severe COPD w/ acute on chronic hypoxemic &  hypercarbic resp failure & cor pulmonale; her last cigarette was the day of admission 04/19/15... She also has a hx of poor compliance w/ med rx & she has once again stopped the prescribed meds perceiving that she is much better since the thoracentesis; she was referred to Hospice for outpt follow up & will continue to see her PCPs at [facility]; I told her I would be happy to recheck at any time if she so desired...  NOTE> since her disch she states that her breathing has been "so much better" that she hasn't needed her inhalers or the nebulizer, and she's off the Pred; she states she is taking the ASA81, Lasix20, Synthroid137, Xanax, and Remeron; she apparently hasn't needed the Roxanol yet...  We had a frank discussion about her end-stage disease & we "proved it to her" by checking her O2sat off the oxygen (74%), and attempting to get a Spirometry tracing (she couldn't do it);  Pt is asked to get back on her meds & we outlined a regimen>  NEBS w/ Albut Qid (breakfast, lunch, dinner, & bedtime);  ADVAIR100- 2spBid (taken after the Neb at breakfast & dinner);  SPIRIVA via handihaler once daily (taken after the Neb at lunch)...   ~  January 10, 2016:  8mo ROV w/ SN>  NOTE: family has moved her into her own apt 12/2015) next to her Sister-in-law/ care giver's apt      Heidi [name] returns for a follow up visit- she has severe COPD/emphysema w/ chronic hypoxemic and hypercarbic resp failure; she was placed on [facility] by DrBurchette in Oct2016; she has been followed by NP-CoryNafziger since then;  She returns today w/ her sister-in-law care giver Nancy #[phone] indicating that Hospice is planning to stop services soon but pt is c/o severe LBP x 6wks ever since she lifted a chlorox bottle & felt a "pop" in her back; she has mentioned this to Hospice but all they have done is to adjust her pain meds (Percocet5-325 & Roxanol 20mg/ml- 0.5ml Q2H prn) without further eval;  She states that her breathing is  OK- meaning she could get about in her apt before the episode of acute LBP 2wks ago;I note that she has not had any lab work or f/u CXR since her disch from the Hosp 04/2015 & she is clearly in need of same- she has a hx of compression fx of spine and osteoporosis but not on any bone building therapy... We reviewed the following medical problems during today's office visit >>     Severe COPD> she could not perform simple spirometry when attempted 04/2015; on NEBS w/ Albut Q6H prn but it makes her too jittery, SYMBICORT160-2spBid & not using the SPIRIVA due to $$;  REC to use Albut 1/2vial in NEB Tid regularly followed by Symbicort160-2spBid & try INCRUSE once daily => we plan ROV in 6wks w/ repeat spirometry & oximetry if she can walk...     Chronic hypoxemic and hypercarbic resp failure> O2sat was 74% on RA 10/16 and ABGs in Hosp showed pCO2=73-87 w/ resp acidosis; she is now on O2 at 2.5L/min regularly &   looks much better, chest is clearer and labs improved; we will check ambulatory oximetry on ret if able to walk for [place]...    Hx pneumonia> resolved- CXR 12/2015 shows COPD & is clear, no opacity...    Hx bilat pulm nodules on CT Chest> Her CXR is much improved 01/10/16; she will be due for a f/u CT Chest on return 02/2016...    Ex-smoker, quit 04/2015>  congrats on not smoking!    Diastolic CHF w/ pulm edema & transudative left pleural effusion 04/2015> on Lasix20 prn only    Hx cerebrovasc dis & stroke> this must have occurred in [place]- no data in EPIC & no prev CDopplers to review; rec to take ASA81/d & we will check CDopplers later...    MEDICAL Issues>  Hyperlipidemia (not on meds),  Hypothyroidism (on Synthroid137/d),  Prot-cal malnutrition (wt is up 38# & BMI=21 now),      Hx HH repair & esoph stricture> not on PPI rx at present    Hx colon polyps, divertics, hems (w/ banding), & cecal volvulus s/p right hemicolectomy at [facility] 2012> aware...    S/P TAH & BSO> aware    DJD, Back pain w/  compression fxs> she indicates that she "cracked my spine" about 9yr ago, says she was hosp x4d w/ MRI, had brace applied- no shots or vertebroplasty ([facility] are [facility] reports in Epic from 2012-2016;      Osteoporosis> she had BMD 01/2015 by PCP w/ TScore -4.8 in Lspine & -3.7 in Left FemNeck; placed on Ca++, VitD OTC, and FOSAMAX70/wk started; she picked up #4 tabs 02/11/15 but never refilled the med...    Hx anxiety/ depression> on XANAX 0.555mid prn and REMERON 1578mhs prn...    Hx poor compliance w/ medical regimen> this was before Hospice involvement in her care (04/2015) EXAM shows Afeb, VSS, O2sat=97% on O2 at 2.5L/min; Wt was 116# in [id number] 38# to BMI=21);  Heent- neg, mallampati1;  Chest- decr BS bilat w/o w/r/r;  Heart- RR gr1/6 SEM w/o r/g;  Abd soft, neg;  Ext- w/o c/c/e in wheelchair & won't stand/walk due to LBP...  CXR 01/10/16>  Much improved- some hyperinflation, bilat apical pleural thickening & bibasilar scarring, no effusion/ edema/ consolidation/ adenopathy, stable ant wedging of T12 w/ kyphosis...   Lumbar spine XRay 01/10/16>  Anterior wedging of L2 is new, old fx of T12, no other fxs & no spondylolithesis- disc sp narrowing L2-3 & L3-4, facet arthritis L5-S1, calcif in abd Ao...  LABS 12/2015>  Chems- wnl, HCO3=32, renal=wnl;  CBC- wnl;  TSH=0.01 on synthroid137;  BNP=70;  Sede=3 IMP/PLAN>>  Respiratory-wise she is improved, CXR is improved, etc but she needs to continue taking O2 and meds regularly- O2 at 2L/min by [place] 24/7, NEBS w/ Albut using 1/2 vial TID every day followed by SYMBICORT160- 2spBid and new INCRUSE one inhalation daily;  XRay of Lspine shows new compression in L2, and Epic review showed that she only took Alendronate70 for 4 wks in Aug2016- she never refilled the Rx;  Discussed CXR & Lumbar films w/ Sister-in-law care giver Nancy=> REC that Leanza re-start Calcium/ VitD/ ALENDRONATE70/wk & stay on this; she has a back  brace at home & pain meds from Hospice, needs PT for ambulation;  Second issue is her oversuppressed TSH on Synth137=> REC decr dose to 112 & recheck TSH on return... ROV planned in 6wks time.  ~  February 23, 2016:  6wk ROV & f/u w/ SN>  Takeila has done remarkably well from the pulm standpoint- stabilized and improved suffic to be able to move into her own appt & care for herself w/ family support nearby; she says she feels like she's getting better too; she was working w/ PT at home but she thinks they were working her too hard & she sent them away- I explained that this therapy is the MOST IMPORTANT thing for her continued recovery!  She tells me that this is the last month of her yr on Hospice, she has Roxanol on her med list but thank goodness she hasn't needed it!  She is in a wheelchair in the office today- but tells me she is up & about at home=> we will walk her today & check O2sats...    1) Severe COPD> she could not perform simple spirometry when attempted 04/2015; on NEBS w/ Albut Q6H prn & using 1/2 vial 2-3x per wk, SYMBICORT160-2spBid & Incruse once daily; Hx medication noncompliance & not using the NEBS Tid as requested!     2) Chronic hypoxemic and hypercarbic resp failure> O2sat was 74% on RA 10/16 and ABGs in Hosp showed pCO2=73-87 w/ resp acidosis; she is now on O2 at 2.5L/min regularly & looks much better, chest is clearer and labs improved; ambulatory oximetry 02/2016 showed no desaturation on 2.5L/min...     3) Hx pneumonia> resolved- CXR 12/2015 shows COPD & clear, no opacity...    4) Hx bilat pulm nodules on CT Chest> Her CXR is much improved 01/10/16; she will be due for a f/u CT Chest to check these nodules soon...    5) Ex-smoker, quit 04/2015>  congrats on not smoking!    6) Diastolic CHF w/ pulm edema & transudative left pleural effusion 04/2015> on Lasix20 prn only now    7) Hx cerebrovasc dis & stroke> this must have occurred in [place]- no data in EPIC & no prev CDopplers to  review; rec to take ASA81/d & she will need CDopplers from her PCP on ret...    8) MEDICAL Issues>  Hyperlipidemia (not on meds),  Hypothyroidism (on Synthroid112/d),  Prot-cal malnutrition (wt is up to 114# & BMI=20 now); I reviewed all her TSH readings and called her Pharm ([facility] in [place]) & they confirmed poor med compliance in past- eery time her dose was increased she was NOT taking her meds regularly!  She is now on 112mcg/d and pill counts are accurate- TSH=0.02 so this dose is still too high for her; WE WILL DECR DOSE TO 75mcg/d and turn over follow up of this problem to her PCP- DrCory & DrBurchette...    9) Hx HH repair & esoph stricture> not on PPI rx at present    10) Hx colon polyps, divertics, hems (w/ banding), & cecal volvulus s/p right hemicolectomy at [place] 2012> aware...    11) S/P TAH & BSO> aware    12) DJD, Back pain w/ compression fxs> she indicates that she "cracked my spine" about 4yrs ago, says she was hosp x4d w/ MRI, had brace applied- no shots or vertebroplasty (this must have been done at [facility] as there are no XRay reports in Epic from 2012-2016; she was taking Roxanol for pain but will be leaving hospice soon & her PCP will assume care of her osteoporosis, back pain=> pain meds and bone building therapy.    13) Osteoporosis> she had BMD 01/2015 by PCP w/ TScore -4.8 in Lspine & -3.7 in   Left FemNeck; placed on Ca++, VitD OTC, and FOSAMAX70/wk started; she picked up #4 tabs 02/11/15 but never refilled the med...    14) Hx anxiety/ depression> on XANAX 0.5mgTid prn and REMERON 15mg Qhs prn...    15) Hx poor compliance w/ medical regimen> this was before Hospice involvement in her care (04/2015) EXAM shows Afeb, VSS, O2sat=97% on O2 at 1.5L/min; Wt = 114#, BMI=21;  Heent- neg, mallampati1;  Chest- decr BS bilat w/o w/r/r;  Heart- RR gr1/6 SEM w/o r/g;  Abd soft, neg;  Ext- w/o c/c/e in wheelchair today...  Ambulatory oximetry 02/23/16>  O2sat=94% on 1.5L/min at rest  w/ pulse=82/min;  She ambulated 3 Laps pushing her wheelchair w/ lowest O2sat=91% w/ HR=110/min...  LABS 02/23/16 showed TSH=0.02 on Levothy 112mcg/d taken regularly (confirmed by [facility])... IMP/PLAN>>  Marlen has stabilized from the cardiopulm standpoint & she is instructed to continue her current meds regularly Symbicort & Incruse) plus her NEBS, VentHFA, & Lasix20 as needed;  Looks like she needs CDoppler f/u, f/u FLP, management of her thyroid medication to insure compliance w/ Rx, and management of her osteoporosis/ bone building therapy & her back pain due to compression fxs => note sent to her PCP for all these...  ~  April 10, 2016:  6wk ROV w/ SN>  Deziah returns for an add-on visit after she received visit here in [place] from family in [place] including grandkids w/ URI;  She was coughing up green mucus, called Hospice & they did a portable at home CXR that is said to be OK, clear, and treated her w/ Prednisone, "It was just a virus" she says, now improved... She is actually doing very well w/o acute resp symptoms and she has remained on her Pred10, NEBS w/ Albut- 1/2 vial Tid, Symbicort160-2Bid, Incruse once daily... She is c/o her port O2 system-- has difficulty filling the tanks, too heavy for her, she's "scared of it"=> needs POC (wants simply-Go mini) & we will contact [facility] to change out her system... She is also due for her follow up CT Chest to compare to 02/2015...    Since she was here last she had a f/u visit w/ PCP- [name] at [facility]> f/u depression- improved on Remeron for sleep, LBP- hospice XRays reported neg but showed L2 & T12 compressions- given Medrol dosepak & Salonpas patches... He has since reviewed my last OV note w/ requests for [facility] f/u for her medical issues- hypothyroid w/oversuppressed TSH on Synthroid (we cut her down to 75mcg/d), osteoporosis & compression fxs, due for f/u CDopplers...  SEE PROB LIST ABOVE>>     EXAM shows Afeb, VSS, O2sat=95% on O2  at ?3L/min; Wt = 120#, BMI=22;  Heent- neg, mallampati1;  Chest- decr BS bilat w/o w/r/r;  Heart- RR gr1/6 SEM w/o r/g;  Abd soft, neg;  Ext- w/o c/c/e;  Neuro- weak, no focal deficits...  LABS 04/10/16 per Cory> TSH=0.67 on Synthroid75- continue same;  VitD=16 & needs supplement w/ 50K weekly... IMP/PLAN>>  COPD/ chr resp failure stable on current regimen- continue same;  Thyroid is improved on the 75mcg/d taken regularly- continue same;  She needs VitD and bone building therapy=> start VitD50K per week and ALENDRONATE 70mg one per week- must get these filled regularly & take them weekly going forward without fail...  ~  May 11, 2016:  1mo ROV & add-on appt requested for dyspnea> Jazalyn returns for an add-on visit "because I couldn't get thru to DrCory" c/o incr SOB, hard to cough   up any phlegm, denies f/c/s, c/o no appetite, shakey/ snappy/ agitated and incr SOB w/ activity & ADLs, feels like she can't get a deep breath, "anxious all the time"- says all this occurred when PRED weaned to 1/2 tab daily & she's been off x 2wks now; Hospice came out to her house & did a CXR- reported to be clear/ NAD (not avail for review, last CXR in Epic 01/10/16=> COPD, NAD, kyphosis & compression T12;...     EXAM shows Afeb, VSS, O2sat=96% on O2 at 3L/min; Wt = 124#, BMI=22;  HEENT- neg, mallampati1;  Chest- decr BS bilat w/o w/r/r;  Heart- RR gr1/6 SEM w/o r/g;  Abd soft, neg;  Ext- w/o c/c/e;  Neuro- weak, no focal deficits... IMP/PLAN>>  Lavonia has been using her NEBULIZER just prn & averaging once qod or so- we discussed a regular treatment regimen that includes using her NEB w/ 1/2 vial of Albut Tid regularly followed by her Symbicort160-2spBid & the Incruse once daily;  We will restart a systemic steroid med- try MEDROL 8mg tabs (one Bid for 10d then down to 1Qam til return);  In addition she will increase her Alprazolam to 1 tab Tid regularly (she's been on 1Bid but still anxious);  Keep other meds the same including  Alendronate70 once per week, Vit D 50K once per week, and Synthroid75 daily 1st thing in the Am;  We plan ROV recheck in 3wks time...  ~  June 05, 2016:  1mo ROV & add-on appt at request of Hospice nurse who said she had a "little rattle" in her chest- pt denies symptoms indicating chr stable SOB/DOE, no cough/ sput/ hemoptysis/ CP/ edema...  As noted Ginamarie has severe COPD (on Medrol8, NEBS w/ 1/2 vial Albut Tid followed by Symbicort160-2spBid & Incruse once daily); chr hypoxemic & hypercarbic resp fail (on O2 at 2.5L/min); Abn Chest CT w/ bilat pulm nodules; Ex-smoker (quit 04/2015); Hx pneumonia, Hx diastolic CHF w/ pulm edema & transudative L effusion 04/2015 (on Lasix20)... Her dyspnea is multifactorial w/ all these problems in addition to anxiety & poor medication compliance...    EXAM shows Afeb, VSS, O2sat=94% on O2 at 2L/min; Wt = 130# up 7#;  HEENT- neg, mallampati1;  Chest- decr BS bilat w/ few velcro rales at bases;  Heart- RR gr1/6 SEM w/o r/g;  Abd soft, neg;  Ext- w/o c/c/e;  Neuro- weak. IMP/PLAN>>  Elida is concerned about the 7# wt gain on Medrol8 so we decided to decr this to 1/2 tab Qam; otherw continue same meds & we plan ROV in 3mo...  ~  June 19, 2016:  2wk ROV & add-on appt requested for head & chest congestion, cough w/ thick green mucus, and SOB- can't get a DB; pt denies hemoptysis/ f/c/s/ CP/ edema/ etc... As noted Amal has severe COPD (on Medrol8-1/2 daily (but she stopped it on her own), NEBS w/ 1/2 vial Albut Tid followed by Symbicort160-2spBid & Incruse once daily); chr hypoxemic & hypercarbic resp fail (on O2 at 2.5L/min); Abn Chest CT w/ bilat pulm nodules; Ex-smoker (quit 04/2015); Hx pneumonia, Hx diastolic CHF w/ pulm edema & transudative L effusion 04/2015 (on Lasix20)... Her dyspnea is multifactorial w/ all these problems in addition to anxiety (on Xanax0.5mg but not taking regularly) & poor medication compliance (eg- still only using NEB bid)... NOTE> she called PCP  but didn't get response, Hospice stopped ~1wk ago; she wants POC...    EXAM shows Afeb, VSS, O2sat=93% on O2 at 2L/min; Wt = 131#;    HEENT- neg, mallampati1;  Chest- decr BS bilat w/ few velcro rales at bases;  Heart- RR gr1/6 SEM w/o r/g;  Abd soft, neg;  Ext- w/o c/c/e;  Neuro- weak.  CXR 06/19/16 (independently reviewed by me in the PACS system) showed COPD & incr markings both bases c/w Atx- chr changes include biapical pleural thickening, cardiomeg, Ao atherosclerosis, modHH, T12 compression fx...   LABS 06/19/16>  Chems- ok w/ HCO3=31 otherw wnl;  CBC- wnl w/ wbc=8.4;  Sed=49;  BNP=19... IMP/PLAN>>  Once again Bich is her own worse enemy by stopping the Medrol on her own & decreasing the Nebs to Bid; we spent extra time reviewing the rationale behind the Med Rx including Medrol (rec to incr to 8mg/d again), NEBS w/ albut (1/2 vial TID) followed by the Symbicort160-2spBid & Incruse once daily; asked to incr the Mucinex1200mg Bid w/ fluids, and take the Alprazolam more regularly eg- 1/2 tab tid,  +continue her O2 etc... She requests a POC so she can incr her mobility... We plan ROV in 2mo for recheck... Note: >50% of this 30min visit was spent in counseling and coordination of care...   ~  August 14, 2016:  2mo ROV & pulmonary follow up visit>  Maddalena reports a good interval & indicates that her breathing is at baseline; once again however she has decreased the NEBs to just 1-2/d & she tells me that sometimes she skips a day (she believes that this means she is doing better); she is using the Symbicort & Incruse regularly; she remains on the Medrol8mg tabs taking 1/2 tab daily; she remains on O2 at 2L/min flow but still has the port tanks and has not received a call from [facility] regarding the desired POC- she tells me that she knows that insurance will cover this because she has Medicare, [employer], & mail handlers insurance... She recently had 2 OVs w/ her PCP- notes reviewed by me & she indicates that she is  taking Alpraz0.5mgBid & not sleeping at night w/o the Remeron30=> asked us to refill these for her... We reviewed the following medical problems during today's office visit >>     1) Severe COPD> she could not perform simple spirometry when attempted 04/2015; on MEDROL8-1/2Qam, NEBS w/ Albut Tid & using 1/2 vial 1-2x/d, SYMBICORT160-2spBid & Incruse once daily; Hx medication noncompliance & not using the NEBS Tid as requested!     2) Chronic hypoxemic and hypercarbic resp failure> O2sat was 74% on RA 10/16 and ABGs in Hosp showed pCO2=73-87 w/ resp acidosis; she is now on O2 at 2.5L/min regularly & stable, chest is clearer and labs improved; ambulatory oximetry 02/2016 showed no desaturation on 2.5L/min...     3) Hx pneumonia> resolved- last CXR 06/2016 shows COPD & clear, no opacity...    4) Hx bilat pulm nodules on CT Chest> Her CXRs are much improved; her last CT Chest was 02/2015 & she never got the planned 03/2016 f/u CT scan...    5) Ex-smoker, quit 04/2015>  congrats on not smoking!    6) Diastolic CHF w/ pulm edema & transudative left pleural effusion 04/2015> on Lasix20 prn for edema...    7) Hx cerebrovasc dis & stroke> this must have occurred in [place]- no data in EPIC & no prev CDopplers to review; rec to take ASA81/d & DrCory never ordered the CDopplers...    8) MEDICAL Issues>  Hyperlipidemia (not on meds),  Hypothyroidism (on Synthroid75/d),  Hx Prot-cal malnutrition (wt is up to 132# & BMI=23 now);   Note> I reviewed all her TSH readings and called her Pharm ([facility] in [place]) & they confirmed poor med compliance in past- every time her dose was increased she was NOT taking her meds regularly!  on 112mcg/d and pill counts are accurate- TSH=0.02 so this dose is still too high for her; WE DECR DOSE TO 75mcg/d and turn over follow up of this problem to her PCP- DrCory & DrBurchette...    9) Hx HH repair & esoph stricture> not on PPI rx at present    10) Hx colon polyps, divertics, hems (w/  banding), & cecal volvulus s/p right hemicolectomy at [place] 2012> aware...    11) S/P TAH & BSO> aware    12) DJD, Back pain w/ compression fxs> she indicates that she "cracked my spine" about 4yrs ago, says she was hosp x4d w/ MRI, had brace applied- no shots or vertebroplasty (this must have been done at [facility] as there are no XRay reports in Epic from 2012-2016; she was taking Roxanol for pain but will be leaving hospice soon & her PCP will assume care of her osteoporosis, back pain=> pain meds and bone building therapy.    13) Osteoporosis> she had BMD 01/2015 by PCP w/ TScore -4.8 in Lspine & -3.7 in Left FemNeck; placed on Ca++, VitD OTC, and FOSAMAX70/wk started=> but compliance is poor...    14) Hx anxiety/ depression> on XANAX 0.5mgTid prn and REMERON 15mg Qhs prn...    15) Hx poor compliance w/ medical regimen> this was before Hospice involvement in her care (04/2015)    EXAM shows Afeb, VSS, O2sat=93% on O2 at 2L/min; Wt = 131#;  HEENT- neg, mallampati1;  Chest- decr BS bilat w/ few velcro rales at bases;  Heart- RR gr1/6 SEM w/o r/g;  Abd soft, neg;  Ext- w/o c/c/e;  Neuro- weak. IMP/PLAN>>  We spent some time again reviewing the need/ rationale behind the Tid NEBS w/ 1/2 vial of Albut followed by the Symbicort Bid & Spiriva once daily; she has requested that we refill her Xanax0.5mgBid, Synthroid75/d, and Remeron30mgQhs; we will again contact [facility] regarding a POC for pt's use... we plan ROV in 3 months...    Past Medical History:  Diagnosis Date  . Arthritis   . Cerebrovascular disease   . Chronic diastolic CHF (congestive heart failure) (HCC) 02/23/2016  . COPD (chronic obstructive pulmonary disease) (HCC)   . Depression   . Diverticulosis   . Esophageal stricture   . History of pneumonia   . History of stroke    mild oncoming  . 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 Hyperlipidemia   . Hypothyroidism   . Internal hemorrhoids   . Osteoporosis    . Pneumonia    HX of  . Positive ANA (antinuclear antibody)   . Pulmonary nodule   . Rectal ulcer   . Shortness of breath    periodically with COPD flair up  . 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(HCC)     Past Surgical History:  Procedure Laterality Date  . BLADDER SURGERY     tack  . CATARACT EXTRACTION     bilateral  . COLONOSCOPY  06/22/2011   Procedure: COLONOSCOPY;  Surgeon: Dora M Brodie, MD;  Location: [facility];  Service: Endoscopy;  Laterality: N/A;  . GASTRIC VARICES BANDING  06/22/2011   Procedure: HEMORRHOID BANDING;  Surgeon: Dora M Brodie, MD;  Location: [facility];  Service: Endoscopy;  Laterality: N/A;  . HEMICOLECTOMY    2012   emergent surgery [facility]  . HIATAL HERNIA REPAIR     x 2  . TONSILLECTOMY    . TOTAL ABDOMINAL HYSTERECTOMY W/ BILATERAL SALPINGOOPHORECTOMY  1998    Outpatient Encounter Prescriptions as of 08/14/2016  Medication Sig  . albuterol (PROVENTIL HFA;VENTOLIN HFA) 108 (90 BASE) MCG/ACT inhaler Inhale 2 puffs into the lungs every 6 (six) hours as needed for wheezing or shortness of breath.  . albuterol (PROVENTIL) (2.5 MG/3ML) 0.083% nebulizer solution Use 1/2 vial of ALBUTEROL in NEBULIZER three times daily...  . alendronate (FOSAMAX) 70 MG tablet Take 1 tablet (70 mg total) by mouth once a week. Take with a full glass of water on an empty stomach first thing in the morning.  . ALPRAZolam (XANAX) 0.5 MG tablet Take 1 tablet (0.5 mg total) by mouth 2 (two) times daily as needed for anxiety.  . budesonide-formoterol (SYMBICORT) 160-4.5 MCG/ACT inhaler Inhale 2 puffs into the lungs 2 (two) times daily.  . furosemide (LASIX) 20 MG tablet Take 1 tablet (20 mg total) by mouth daily as needed for fluid or edema.  . gabapentin (NEURONTIN) 300 MG capsule Take 1 capsule (300 mg total) by mouth 2 (two) times daily.  . halobetasol (ULTRAVATE) 0.05 % ointment Apply topically 2 (two) times daily.    . levothyroxine (SYNTHROID) 75 MCG tablet Take 1 tablet (75 mcg  total) by mouth daily before breakfast.  . methylPREDNISolone (MEDROL) 8 MG tablet Take 1 daily x 14 days then 1/2 tablet daily thereafter  . mirtazapine (REMERON) 30 MG tablet Take 1 tablet (30 mg total) by mouth at bedtime.  . morphine (ROXANOL) 20 MG/ML concentrated solution Take 0.5 mLs (10 mg total) by mouth every 2 (two) hours as needed for moderate pain, severe pain, anxiety or shortness of breath.  . oxyCODONE-acetaminophen (PERCOCET) 5-325 MG tablet Take 1 to 2 tablets by mouth every 6 hours as need for pain  . simvastatin (ZOCOR) 10 MG tablet Take 1 tablet (10 mg total) by mouth at bedtime.  . umeclidinium bromide (INCRUSE ELLIPTA) 62.5 MCG/INH AEPB Inhale 1 puff into the lungs daily.  . Vitamin D, Ergocalciferol, (DRISDOL) 50000 units CAPS capsule Take 1 capsule (50,000 Units total) by mouth every 7 (seven) days.  . [DISCONTINUED] ALPRAZolam (XANAX) 0.5 MG tablet TAKE ONE TABLET BY MOUTH THREE TIMES DAILY AS NEEDED FOR ANXIETY OR SLEEP (AIR  HUNGER.  DYSPNEA).  . [DISCONTINUED] levothyroxine (SYNTHROID) 75 MCG tablet Take 1 tablet (75 mcg total) by mouth daily before breakfast.  . [DISCONTINUED] mirtazapine (REMERON) 30 MG tablet Take 1 tablet (30 mg total) by mouth at bedtime.   No facility-administered encounter medications on file as of 08/14/2016.     Allergies  Allergen Reactions  . Morphine And Related Nausea And Vomiting  . Penicillins Hives    Has patient had a PCN reaction causing immediate rash, facial/tongue/throat swelling, SOB or lightheadedness with hypotension: No Has patient had a PCN reaction causing severe rash involving mucus membranes or skin necrosis: No Has patient had a PCN reaction that required hospitalization No Has patient had a PCN reaction occurring within the last 10 years: No If all of the above answers are "NO", then may proceed with Cephalosporin use.    Immunization History  Administered Date(s) Administered  . H1N1 06/30/2008  . Influenza  Split 04/18/2012  . Influenza Whole 06/10/2007, 04/14/2010, 04/18/2012  . Influenza, High Dose Seasonal PF 06/02/2015, 04/10/2016  . Influenza,inj,Quad PF,36+ Mos 04/16/2013  . Pneumococcal Conjugate-13 06/02/2015  .   Pneumococcal Polysaccharide-23 05/17/2006, 04/16/2013  . Td 03/29/1999  . Tdap 04/16/2013  . Zoster 07/17/2013    Current Medications, Allergies, Past Medical History, Past Surgical History, Family History, and Social History were reviewed in [employer] record.   Review of Systems             All symptoms NEG except where BOLDED >>  Constitutional:  F/C/S, fatigue, anorexia, unexpected weight change. HEENT:  HA, visual changes, hearing loss, earache, nasal symptoms, sore throat, mouth sores, hoarseness. Resp:  cough, sputum, hemoptysis; SOB, tightness, wheezing. Cardio:  CP, palpit, DOE, orthopnea, edema. GI:  N/V/D/C, blood in stool; reflux, abd pain, distention, gas. GU:  dysuria, freq, urgency, hematuria, flank pain, voiding difficulty. MS:  joint pain, swelling, tenderness, decr ROM; neck pain, back pain, etc. Neuro:  HA, tremors, seizures, dizziness, syncope, weakness, numbness, gait abn. Skin:  suspicious lesions or skin rash. Heme:  adenopathy, bruising, bleeding. Psyche:  confusion, agitation, sleep disturbance, hallucinations, anxiety, depression suicidal.   Objective:   Physical Exam       Vital Signs:  Reviewed...   General:  WD, thin, 76 y/o WM in NAD; alert & oriented; pleasant & cooperative... HEENT:  [place]/AT; Conjunctiva- pink, Sclera- nonicteric, EOM-wnl, PERRLA, EACs-clear, TMs-wnl; NOSE-clear; THROAT-clear & wnl.  Neck:  Supple w/ fair ROM; no JVD; normal carotid impulses w/o bruits; no thyromegaly or nodules palpated; no lymphadenopathy.  Chest:  Decr BS bilat, clear w/o w/r/r heard... Heart:  Regular Rhythm; ?S4, gr1/6 SEM Abdomen:  Soft & nontender- no guarding or rebound; normal bowel sounds; no organomegaly or masses  palpated. Ext:  decr ROM; without deformities +arthritic changes; no varicose veins, venous insuffic, no edema;  Pulses intact w/o bruits. Neuro:  No focal neuro deficits- pt in wheelchair & won't stand or walk due to LBP... Derm:  No lesions noted; no rash etc. Lymph:  No cervical, supraclavicular, axillary, or inguinal adenopathy palpated.   Assessment:      IMP>>     Severe COPD> she could not perform simple spirometry when attempted 04/2015; on NEBS w/ Albut Q6H prn but it makes her too jittery, SYMBICORT160-2spBid & not using the [facility] due to $$;  REC to use Albut 1/2vial in NEB Tid regularly followed by Symbicort160-2spBid & try INCRUSE once daily => stay on these regularly...    Chronic hypoxemic and hypercarbic resp failure> O2sat was 74% on RA 10/16 and ABGs in [place] showed pCO2=73-87 w/ resp acidosis; she is now on O2 at 2.5L/min regularly & looks much better, chest is clearer and labs improved;  Ambulatory oximetry 02/2016 w/ sats in the 90s on 1.5L after 3 Laps- much improved...    Hx pneumonia> resolved- CXR 12/2015 shows COPD & is clear, no opacity...    Hx bilat pulm nodules on CT Chest> Her CXR is much improved 01/10/16; she is due for f/u CT Chest but can't get this til she is OFF Hospice!    Ex-smoker, quit 04/2015>  congrats on not smoking!    Diastolic CHF w/ pulm edema & transudative left pleural effusion 04/2015> on Lasix20 prn only    Hx cerebrovasc dis & stroke> this must have occurred in [place]- no data in EPIC & no prev CDopplers to review; rec to take ASA81/d & we will check CDopplers later...    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 2012>  aware...    S/P TAH & BSO> aware    DJD, Back pain w/ compression fxs> she indicates that she "cracked my spine" about 4yrs ago, says she was hosp x4d w/ MRI, had brace applied- no shots or vertebroplasty (this must have been done at [facility] as there are no XRay reports in Epic from 2012-2016;      Osteoporosis> she had BMD 01/2015 by PCP w/ TScore -4.8 in Lspine & -3.7 in Left FemNeck; placed on Ca++, VitD OTC, and FOSAMAX70/wk started; she picked up #4 tabs 02/11/15 but never refilled the med; WE STARTED VITD 50K per week & ALENDRONATE 70mg/wk (Sep2017).    Hx anxiety/ depression> on XANAX 0.5mgTid prn and REMERON 15mg Qhs prn...    Hx poor compliance w/ medical regimen> this was before Hospice involvement in her care (04/2015)  PLAN>>  01/10/16>   Respiratory-wise she is improved, CXR is improved, etc but she needs to continue taking O2 and meds regularly- O2 at 2L/min by  24/7, NEBS w/ Albut using 1/2 vial TID every day followed by SYMBICORT160- 2spBid and new INCRUSE one inhalation daily;  XRay of Lspine shows new compression in L2, and Epic review showed that she only took Alendronate70 for 4 wks in Aug2016- she never refilled the Rx;  Discussed CXR & Lumbar films w/ Sister-in-law care giver Nancy=> REC that Breella re-start Calcium/ VitD/ ALENDRONATE70/wk & stay on this (she never did); she has a back brace at home & pain meds from Hospice, needs PT for ambulation;  We decreased her Synthroid to 112mcg/d as well... ROV planned in 6wks time. 02/23/16>   Kamla has stabilized from the cardiopulm standpoint & she is instructed to continue her current meds regularly (Symbicort & Incruse) plus her NEBS, VentHFA, & Lasix20 as needed;  Looks like she needs CDoppler f/u, f/u FLP, management of her thyroid medication to insure compliance w/ Rx (we cut her to 75mcg today), and management of her osteoporosis/ bone building therapy & her back pain due to compression fxs => note sent to her PCP... 04/10/16>   Laci  needs to continue her O2, Pred10, NEBS, Symbicort, Incruse regularly;  In addition she needs to stay on the Synthroid75mcg/d dose regularly;  And finally needs VitD50K & Alendronate70 both weekly for her bones... 05/11/16>   Geraldean has been using her NEBULIZER just prn & averaging once qod or so- we discussed a regular treatment regimen that includes using her NEB w/ 1/2 vial of Albut Tid regularly followed by her Symbicort160-2spBid & the Incruse once daily;  We will restart a systemic steroid med- try MEDROL 8mg tabs (one Bid for 10d then down to 1Qam til return);  In addition she will increase her Alprazolam to 1 tab Tid regularly (she's been on 1Bid but still anxious);  Keep other meds the same including Alendronate70 once per week, Vit D 50K once per week, and Synthroid75 daily 1st thing in the Am;  We plan ROV recheck in 3wks time... 06/05/16>   Add on appt per Hospice nurse who heard rales at bases, pt is not acutely symptomatic- but is concerned about 7# wt gain on Medrol8 so we decided to decr this to 1/2 tab Qam; otherw continue same meds & we plan ROV in 3mo... 06/19/16>     Once again Alica is her own worse enemy by stopping the Medrol on her own & decreasing the Nebs to Bid; we spent extra time reviewing the rationale behind the Med Rx including Medrol (rec to incr to 8mg/d again), NEBS w/ albut (1/2 vial TID) followed by the Symbicort160-2spBid & Incruse once daily; asked to incr the Mucinex1200mg Bid w/ fluids, and incr the Alprazolam 1/2 tab tid, +continue her O2 etc... She requests a POC so she can incr her mobility... 08/14/16>   We spent some time again reviewing the need/ rationale behind the Tid NEBS w/ 1/2 vial of Albut followed by the Symbicort Bid & Spiriva once daily; she has requested that we refill her Xanax0.5mgBid, Synthroid75/d, and Remeron30mgQhs; we will again contact [facility] regarding a POC for pt's use... we plan ROV in 3 months...     Plan:     Patient's Medications  New  Prescriptions   No medications on file  Previous Medications   ALBUTEROL (PROVENTIL HFA;VENTOLIN HFA) 108 (90 BASE) MCG/ACT INHALER    Inhale 2 puffs into the lungs every 6 (six) hours as needed for wheezing or shortness of breath.   ALBUTEROL (PROVENTIL) (2.5 MG/3ML) 0.083% NEBULIZER SOLUTION    Use 1/2 vial of ALBUTEROL in NEBULIZER three times daily...   ALENDRONATE (FOSAMAX) 70 MG TABLET    Take 1 tablet (70 mg total) by mouth once a week. Take with a full glass of water on an empty stomach first thing in the morning.   BUDESONIDE-FORMOTEROL (SYMBICORT) 160-4.5 MCG/ACT INHALER    Inhale 2 puffs into the lungs 2 (two) times daily.   FUROSEMIDE (LASIX) 20 MG TABLET    Take 1 tablet (20 mg total) by mouth daily as needed for fluid or edema.   GABAPENTIN (NEURONTIN) 300 MG CAPSULE    Take 1 capsule (300 mg total) by mouth 2 (two) times daily.   HALOBETASOL (ULTRAVATE) 0.05 % OINTMENT    Apply topically 2 (two) times daily.     METHYLPREDNISOLONE (MEDROL) 8 MG TABLET    Take 1 daily x 14 days then 1/2 tablet daily thereafter   MORPHINE (ROXANOL) 20 MG/ML CONCENTRATED SOLUTION    Take 0.5 mLs (10 mg total) by mouth every 2 (two) hours as needed for moderate pain, severe pain, anxiety or shortness of breath.   OXYCODONE-ACETAMINOPHEN (PERCOCET) 5-325 MG TABLET    Take 1 to 2 tablets by mouth every 6 hours as need for pain   SIMVASTATIN (ZOCOR) 10 MG TABLET    Take 1 tablet (10 mg total) by mouth at bedtime.   UMECLIDINIUM BROMIDE (INCRUSE ELLIPTA) 62.5 MCG/INH AEPB    Inhale 1 puff into the lungs daily.   VITAMIN D, ERGOCALCIFEROL, (DRISDOL) 50000 UNITS CAPS CAPSULE    Take 1 capsule (50,000 Units total) by mouth every 7 (seven) days.  Modified Medications   Modified Medication Previous Medication   ALPRAZOLAM (XANAX) 0.5 MG TABLET ALPRAZolam (XANAX) 0.5 MG tablet      Take 1 tablet (0.5 mg total) by mouth 2 (two) times daily as needed for anxiety.    TAKE ONE TABLET BY MOUTH THREE TIMES DAILY AS  NEEDED FOR ANXIETY OR SLEEP (AIR  HUNGER.  DYSPNEA).   LEVOTHYROXINE (SYNTHROID) 75 MCG TABLET levothyroxine (SYNTHROID) 75 MCG tablet      Take 1 tablet (75 mcg total) by mouth daily before breakfast.    Take 1 tablet (75 mcg total) by mouth daily before breakfast.   MIRTAZAPINE (REMERON) 30 MG TABLET mirtazapine (  REMERON) 30 MG tablet      Take 1 tablet (30 mg total) by mouth at bedtime.    Take 1 tablet (30 mg total) by mouth at bedtime.  Discontinued Medications   No medications on file

## 2016-08-14 NOTE — Patient Instructions (Signed)
Today we updated your med list in our EPIC system...    Continue your current medications the same...  We reilled the meds you requested today...  Remember to use the NEBULIZER 3 times daily w/ 1/2 vial of the Albuterol per treatment...    Follow this by the [facility] twice daily & the INCRUSE once daily...  We will again try to contact [facility] regarding a Portable Oxygen concentrator (POC) for your use...  Call for any questions...  Let's plan a follow up visit in 3 months, sooner if needed for problems.Marland KitchenMarland Kitchen

## 2016-08-14 NOTE — Progress Notes (Signed)
Subjective:     Patient ID: Heidi [name], female   DOB: 07/07/1941, 75 y.o.   MRN: [id number]  HPI 75 y/o WF former smoker who quit 2016 w/ severe COPD/emphysema & chronic hypoxemic and hypercarbic resp failure, placed on Hospice by DrSwords/ DrBurchette in 2016, hx pneumonia and small bilat pulm nodules on CT Chest, and diastolic CHF...   ~  May 13, 2015:  Initial pulmonary consult by SN>        Heidi [name] was Hosp 10/3 - 04/27/15 by [facility] after presenting via EMS w/ cough, weakness, increased SOB; she was still smoking, not on home O2; found to have COPD exac & CAP w/ bibasilar opacities, acute on chronic hypoxemic & hypercarbic resp failure, & superimposed CHF w/ bilat L>R pleural effusions (this was tapped 10/9 yielding transudative fluid, neg cytology, neg culture)... She was seen by CCM- both Heidi [name] & Heidi [name] attended her in the hosp; Treated w/ Rocephin/ Zithromax, Solumedrol, Nebs, & brief stay in step-down unit on BiPap; disch on ADVAIR100-1spBid, SPIRIVA daily, PRED taper, NEBS w/ Albut... She was also treated for acute on chronic diastolic CHF- mod dil RV & reduced RV sys function (w/ preserved LVF) on 2DEcho & diuresed w/ Lasix but only disch on 20mg prn for edema... Hospice was consulted (DrGolding's note reviewed) and she has entered the Hospice program (she is a DNR, DNI) w/ a prognosis est <6mo & she was disch on ROXANOL 20mg/ml--take 1/2 ml Q2H as needed for pain, SOB, anxiety...  She last saw DrSwords 11/2013, she has established new PCP- Heidi [name] & DrBurchette- has post hosp f/u pending; Note> she cancelled 2 appts w/ DrMcQuaid recently...       Currently Heidi [name] is very stoic- states she's not having any breathing problems at the present time & credits the thoracentesis w/ her remarkable improvement; her last cigarette was 04/19/15 prior to her admission; she was disch on O2 at 4L/min & has an O2sat=89% on this in the office today (RA sat was 74%); she was unable to perform PFT today; we had  a frank discussion about her severe, end-stage COPD...       Epic records indicate that she was seen by DrWert in 2011> on Spiriva +/- Advair, stated she quit smoking 2011 on adm for COPD exac, PFT reported FEV1=1.32 (77%), FEV1/FVC ratio=50%, DLCO=70%; CXR showed COPD & biapical pleuroparenchymal scarring, CT Chest 4/11 showed ?mult pulm nodules ?early cavitation r/o MAI, cultures- only NTF, O2sat=95% on RA...  Medical Hx>   She had an annual wellness exam from her PCP 02/2015> COPD, Hx pulm nodule, Hx of pneumonia; Hypothyroidism; adenomatous colonic polyps; Hyperlipidemia; Sigmoid volvulus; Depression; Cerebrovascular disease; Positive ANA; Diverticulosis; Esophageal stricture; Arthritis; History of stroke; Internal hemorrhoids; and Rectal ulcer; protein-calorie malnutrition (she weighs 78 lbs...  EXAM shows Afeb, VSS, O2sat=89% on O2 at 4L/min; Wt=78#, 5'2"Tall, BMI=16;  Heent- neg, mallampati1;  Chest- decr BS bilat, scat rhonchi, & end-exp wheezing;  Heart- RR gr1/6 SEM w/o r/g;  Abd soft, neg;  Ext- w/o c/c/ tr edema...   Baseline CXR 12/31/14 showed hyperinflation c/w COPD, biapical pleuroparenchymal scarring, nipple shadows and T12 compression fx...  CT Chest 02/22/15 showed cardiomeg, atherosclerotic changes in Ao & coronaries, small right effusion; emhysema w/ several additional findings- RUL & lingular subpleural opacities, & 5mm LUL & RLL nodules; osteopenia and T7 +T12 compression fractures...   CXRs performed during the 04/2015 Admission showed cardiomeg, CHF w/ left effusion, COPD w/ interstitial edema & LLL opac => improved post thoracentesis   10/9 w/ 400cc removed...  ABGs during the 10/16 Hosp showed pCO2 in the 73-87 range w/ resp acidosis...   LABS 10/16 reviewed> CBC- ok w/ Hg=13 range;  Chems- note HCO3=44-45 range & she may benefit drom Acetazolamide added to her diuretic regimen if more aggressive treatment is desired...  IMP>> Heidi [name] has severe COPD w/ acute on chronic hypoxemic &  hypercarbic resp failure & cor pulmonale; her last cigarette was the day of admission 04/19/15... She also has a hx of poor compliance w/ med rx & she has once again stopped the prescribed meds perceiving that she is much better since the thoracentesis; she was referred to Hospice for outpt follow up & will continue to see her PCPs at [facility]; I told her I would be happy to recheck at any time if she so desired...  NOTE> since her disch she states that her breathing has been "so much better" that she hasn't needed her inhalers or the nebulizer, and she's off the Pred; she states she is taking the ASA81, Lasix20, Synthroid137, Xanax, and Remeron; she apparently hasn't needed the Roxanol yet...  We had a frank discussion about her end-stage disease & we "proved it to her" by checking her O2sat off the oxygen (74%), and attempting to get a Spirometry tracing (she couldn't do it);  Pt is asked to get back on her meds & we outlined a regimen>  NEBS w/ Albut Qid (breakfast, lunch, dinner, & bedtime);  ADVAIR100- 2spBid (taken after the Neb at breakfast & dinner);  SPIRIVA via handihaler once daily (taken after the Neb at lunch)...   ~  January 10, 2016:  8mo ROV w/ SN>  NOTE: family has moved her into her own apt 12/2015) next to her Sister-in-law/ care giver's apt      Heidi [name] returns for a follow up visit- she has severe COPD/emphysema w/ chronic hypoxemic and hypercarbic resp failure; she was placed on [facility] by DrBurchette in Oct2016; she has been followed by NP-CoryNafziger since then;  She returns today w/ her sister-in-law care giver Heidi [name] #[phone] indicating that Hospice is planning to stop services soon but pt is c/o severe LBP x 6wks ever since she lifted a chlorox bottle & felt a "pop" in her back; she has mentioned this to Hospice but all they have done is to adjust her pain meds (Percocet5-325 & Roxanol 20mg/ml- 0.5ml Q2H prn) without further eval;  She states that her breathing is  OK- meaning she could get about in her apt before the episode of acute LBP 2wks ago;I note that she has not had any lab work or f/u CXR since her disch from the Hosp 04/2015 & she is clearly in need of same- she has a hx of compression fx of spine and osteoporosis but not on any bone building therapy... We reviewed the following medical problems during today's office visit >>     Severe COPD> she could not perform simple spirometry when attempted 04/2015; on NEBS w/ Albut Q6H prn but it makes her too jittery, SYMBICORT160-2spBid & not using the SPIRIVA due to $$;  REC to use Albut 1/2vial in NEB Tid regularly followed by Symbicort160-2spBid & try INCRUSE once daily => we plan ROV in 6wks w/ repeat spirometry & oximetry if she can walk...     Chronic hypoxemic and hypercarbic resp failure> O2sat was 74% on RA 10/16 and ABGs in Hosp showed pCO2=73-87 w/ resp acidosis; she is now on O2 at 2.5L/min regularly &   looks much better, chest is clearer and labs improved; we will check ambulatory oximetry on ret if able to walk for [place]...    Hx pneumonia> resolved- CXR 12/2015 shows COPD & is clear, no opacity...    Hx bilat pulm nodules on CT Chest> Her CXR is much improved 01/10/16; she will be due for a f/u CT Chest on return 02/2016...    Ex-smoker, quit 04/2015>  congrats on not smoking!    Diastolic CHF w/ pulm edema & transudative left pleural effusion 04/2015> on Lasix20 prn only    Hx cerebrovasc dis & stroke> this must have occurred in [place]- no data in EPIC & no prev CDopplers to review; rec to take ASA81/d & we will check CDopplers later...    MEDICAL Issues>  Hyperlipidemia (not on meds),  Hypothyroidism (on Synthroid137/d),  Prot-cal malnutrition (wt is up 38# & BMI=21 now),      Hx HH repair & esoph stricture> not on PPI rx at present    Hx colon polyps, divertics, hems (w/ banding), & cecal volvulus s/p right hemicolectomy at [facility] 2012> aware...    S/P TAH & BSO> aware    DJD, Back pain w/  compression fxs> she indicates that she "cracked my spine" about 67yr ago, says she was hosp x4d w/ MRI, had brace applied- no shots or vertebroplasty ([facility] are [facility] in Epic from [facility]-2016;      Osteoporosis> she had BMD 01/2015 by PCP w/ TScore -4.8 in Lspine & -3.7 in Left FemNeck; placed on Ca++, VitD OTC, and FOSAMAX70/wk started; she picked up #4 tabs 02/11/15 but never refilled the med...    Hx anxiety/ depression> on XANAX 0.571mid prn and REMERON 1559mhs prn...    Hx poor compliance w/ medical regimen> this was before Hospice involvement in her care (04/2015) EXAM shows Afeb, VSS, O2sat=97% on O2 at 2.5L/min; Wt was 116# in [id number] 38# to BMI=21);  Heent- neg, mallampati1;  Chest- decr BS bilat w/o w/r/r;  Heart- RR gr1/6 SEM w/o r/g;  Abd soft, neg;  Ext- w/o c/c/e in wheelchair & won't stand/walk due to LBP...  CXR 01/10/16>  Much improved- some hyperinflation, bilat apical pleural thickening & bibasilar scarring, no effusion/ edema/ consolidation/ adenopathy, stable ant wedging of T12 w/ kyphosis...   Lumbar spine XRay 01/10/16>  Anterior wedging of L2 is new, old fx of T12, no other fxs & no spondylolithesis- disc sp narrowing L2-3 & L3-4, facet arthritis L5-S1, calcif in abd Ao...  LABS 12/2015>  Chems- wnl, HCO3=32, renal=wnl;  CBC- wnl;  TSH=0.01 on synthroid137;  BNP=70;  Sede=3 IMP/PLAN>>  Respiratory-wise she is improved, CXR is improved, etc but she needs to continue taking O2 and meds regularly- O2 at 2L/min by [place] 24/7, NEBS w/ Albut using 1/2 vial TID every day followed by SYMBICORT160- 2spBid and new INCRUSE one inhalation daily;  XRay of Lspine shows new compression in L2, and Epic review showed that she only took Alendronate70 for 4 wks in Aug2016- she never refilled the Rx;  Discussed CXR & Lumbar films w/ Sister-in-law care giver Heidi [name]=> REC that Le[place] re-start Calcium/ VitD/ ALENDRONATE70/wk & stay on this; she has a back  brace at home & pain meds from Hospice, needs PT for ambulation;  Second issue is her oversuppressed TSH on Synth137=> REC decr dose to 112 & recheck TSH on return... ROV planned in 6wks time.  ~  February 23, 2016:  6wk ROV & f/u w/ SN>  Heidi [name] has done remarkably well from the pulm standpoint- stabilized and improved suffic to be able to move into her own appt & care for herself w/ family support nearby; she says she feels like she's getting better too; she was working w/ PT at home but she thinks they were working her too hard & she sent them away- I explained that this therapy is the MOST IMPORTANT thing for her continued recovery!  She tells me that this is the last month of her yr on Hospice, she has Roxanol on her med list but thank goodness she hasn't needed it!  She is in a wheelchair in the office today- but tells me she is up & about at home=> we will walk her today & check O2sats...    1) Severe COPD> she could not perform simple spirometry when attempted 04/2015; on NEBS w/ Albut Q6H prn & using 1/2 vial 2-3x per wk, SYMBICORT160-2spBid & Incruse once daily; Hx medication noncompliance & not using the NEBS Tid as requested!     2) Chronic hypoxemic and hypercarbic resp failure> O2sat was 74% on RA 10/16 and ABGs in Hosp showed pCO2=73-87 w/ resp acidosis; she is now on O2 at 2.5L/min regularly & looks much better, chest is clearer and labs improved; ambulatory oximetry 02/2016 showed no desaturation on 2.5L/min...     3) Hx pneumonia> resolved- CXR 12/2015 shows COPD & clear, no opacity...    4) Hx bilat pulm nodules on CT Chest> Her CXR is much improved 01/10/16; she will be due for a f/u CT Chest to check these nodules soon...    5) Ex-smoker, quit 04/2015>  congrats on not smoking!    6) Diastolic CHF w/ pulm edema & transudative left pleural effusion 04/2015> on Lasix20 prn only now    7) Hx cerebrovasc dis & stroke> this must have occurred in [place]- no data in EPIC & no prev CDopplers to  review; rec to take ASA81/d & she will need CDopplers from her PCP on ret...    8) MEDICAL Issues>  Hyperlipidemia (not on meds),  Hypothyroidism (on Synthroid112/d),  Prot-cal malnutrition (wt is up to 114# & BMI=20 now); I reviewed all her TSH readings and called her Pharm ([facility] in [place]) & they confirmed poor med compliance in past- eery time her dose was increased she was NOT taking her meds regularly!  She is now on 112mcg/d and pill counts are accurate- TSH=0.02 so this dose is still too high for her; WE WILL DECR DOSE TO 75mcg/d and turn over follow up of this problem to her PCP- DrCory & DrBurchette...    9) Hx HH repair & esoph stricture> not on PPI rx at present    10) Hx colon polyps, divertics, hems (w/ banding), & cecal volvulus s/p right hemicolectomy at [facility] 2012> aware...    11) S/P TAH & BSO> aware    12) DJD, Back pain w/ compression fxs> she indicates that she "cracked my spine" about 4yrs ago, says she was hosp x4d w/ MRI, had brace applied- no shots or vertebroplasty (this must have been done at [facility] as there are no XRay reports in Epic from 2012-2016; she was taking Roxanol for pain but will be leaving hospice soon & her PCP will assume care of her osteoporosis, back pain=> pain meds and bone building therapy.    13) Osteoporosis> she had BMD 01/2015 by PCP w/ TScore -4.8 in Lspine & -3.7 in   Left FemNeck; placed on Ca++, VitD OTC, and FOSAMAX70/wk started; she picked up #4 tabs 02/11/15 but never refilled the med...    14) Hx anxiety/ depression> on XANAX 0.72mTid prn and REMERON 14mQhs prn...    15) Hx poor compliance w/ medical regimen> this was before Hospice involvement in her care (04/2015) EXAM shows Afeb, VSS, O2sat=97% on O2 at 1.5L/min; Wt = 114#, BMI=21;  Heent- neg, mallampati1;  Chest- decr BS bilat w/o w/r/r;  Heart- RR gr1/6 SEM w/o r/g;  Abd soft, neg;  Ext- w/o c/c/e in wheelchair today...  Ambulatory oximetry 02/23/16>  O2sat=94% on 1.5L/min at rest  w/ pulse=82/min;  She ambulated 3 Laps pushing her wheelchair w/ lowest O2sat=91% w/ HR=110/min...  LABS 02/23/16 showed TSH=0.02 on [place] 11226md taken regularly ([facility]. IMP/PLAN>>  Heidi [name] stabilized from the cardiopulm standpoint & she is instructed to continue her current meds regularly Symbicort & Incruse) plus her NEBS, VentHFA, & Lasix20 as needed;  Looks like she needs CDoppler f/u, f/u FLP, management of her thyroid medication to insure compliance w/ Rx, and management of her osteoporosis/ bone building therapy & her back pain due to compression fxs => note sent to her PCP for all these...  ~  April 10, 2016:  6wk ROV w/ SN>  Heidi [name] for an add-on visit after [place];  She was coughing up green mucus, called Hospice [facility] they did a portable at home CXR that is said to be OK, clear, and treated her w/ Prednisone, "It was just a virus" she says, now improved... She is actually doing very well w/o acute resp symptoms and she has remained on her Pred10, NEBS w/ Albut- 1/2 vial Tid, Symbicort160-2Bid, Incruse once daily... She is c/o her port O2 system-- has difficulty filling the tanks, too heavy for her, she's "scared of it"=> needs POC (wants simply-Go mini) & we will [facility]... She is also due for her follow up CT Chest to compare to 02/2015...    Since she was here last she had a f/u visit w/ PCP- CorDorothyann Peng [place] depression- improved on Remeron for sleep, LBP- hospice XRays reported neg but showed L2 & T12 compressions- given Medrol dosepak & Salonpas patches... He has since reviewed my last OV note w/ requests for [facility] f/u for her medical issues- hypothyroid w/oversuppressed TSH on Synthroid (we cut her down to 14m59m), osteoporosis & compression fxs, due for f/u CDopplers...  SEE PROB LIST ABOVE>>     EXAM shows Afeb, VSS, O2sat=95% on O2  at ?3L/min; Wt = 120#, BMI=22;  Heent- neg, mallampati1;  Chest- decr BS bilat w/o w/r/r;  Heart- RR gr1/6 SEM w/o r/g;  Abd soft, neg;  Ext- w/o c/c/e;  Neuro- weak, no focal deficits...  LABS 04/10/16 per Heidi> TSH=0.67 on Synthroid75- continue same;  VitD=16 & needs supplement w/ 50K weekly... IMP/PLAN>>  COPD/ chr resp failure stable on current regimen- continue same;  Thyroid is improved on the 14mc56mtaken regularly- continue same;  She needs VitD and bone building therapy=> start VitD50K per week and ALENDRONATE 70mg 45mper week- must get these filled regularly & take them weekly going forward without fail...  ~  May 11, 2016:  76mo RO7moadd-on appt requested for dyspnea> Lenea re[facility]reens for an add-on visit "because I couldn't get thru to DrCory" c/o incr SOB, hard to cough  up any phlegm, denies f/c/s, c/o no appetite, shakey/ snappy/ agitated and incr SOB w/ activity & ADLs, feels like she can't get a deep breath, "anxious all the time"- says all this occurred when PRED weaned to 1/2 tab daily & she's been off x 2wks now; Hospice came out to her house & did a CXR- reported to be clear/ NAD (not avail for review, last CXR in Epic 01/10/16=> COPD, NAD, kyphosis & compression T12;...     EXAM shows Afeb, VSS, O2sat=96% on O2 at 3L/min; Wt = 124#, BMI=22;  HEENT- neg, mallampati1;  Chest- decr BS bilat w/o w/r/r;  Heart- RR gr1/6 SEM w/o r/g;  Abd soft, neg;  Ext- w/o c/c/e;  Neuro- weak, no focal deficits... IMP/PLAN>>  Amariyah has been using her NEBULIZER just prn & averaging once qod or so- we discussed a regular treatment regimen that includes using her NEB w/ 1/2 vial of Albut Tid regularly followed by her Symbicort160-2spBid & the Incruse once daily;  We will restart a systemic steroid med- try MEDROL 78m tabs (one Bid for 10d then down to 1Qam til return);  In addition she will increase her Alprazolam to 1 tab Tid regularly (she's been on 1Bid but still anxious);  Keep other meds the same including  Alendronate70 once per week, Vit D 50K once per week, and Synthroid75 daily 1st thing in the Am;  We plan ROV recheck in 3wks time...  ~  June 05, 2016:  146moOV & add-on appt at request of Hospice nurse who said she had a "little rattle" in her chest- pt denies symptoms indicating chr stable SOB/DOE, no cough/ sput/ hemoptysis/ CP/ edema...  As noted JuShawntelleas severe COPD (on Medrol8, NEBS w/ 1/2 vial Albut Tid followed by Symbicort160-2spBid & Incruse once daily); chr hypoxemic & hypercarbic resp fail (on O2 at 2.5L/min); Abn Chest CT w/ bilat pulm nodules; Ex-smoker (quit 04/2015); Hx pneumonia, Hx diastolic CHF w/ pulm edema & transudative L effusion 04/2015 (on Lasix20)... Her dyspnea is multifactorial w/ all these problems in addition to anxiety & poor medication compliance...    EXAM shows Afeb, VSS, O2sat=94% on O2 at 2L/min; Wt = 130# up 7#;  HEENT- neg, mallampati1;  Chest- decr BS bilat w/ few velcro rales at bases;  Heart- RR gr1/6 SEM w/o r/g;  Abd soft, neg;  Ext- w/o c/c/e;  Neuro- weak. IMP/PLAN>>  Heidi [name] concerned about the 7# wt gain on Medrol8 so we decided to decr this to 1/2 tab Qam; otherw continue same meds & we plan ROV in 73m57mo   ~  June 19, 2016:  2wk ROV & add-on appt requested for head & chest congestion, cough w/ thick green mucus, and SOB- can't get a DB; pt denies hemoptysis/ f/c/s/ CP/ edema/ etc... As noted JudTarayas severe COPD (on Medrol8-1/2 daily (but she stopped it on her own), NEBS w/ 1/2 vial Albut Tid followed by Symbicort160-2spBid & Incruse once daily); chr hypoxemic & hypercarbic resp fail (on O2 at 2.5L/min); Abn Chest CT w/ bilat pulm nodules; Ex-smoker (quit 04/2015); Hx pneumonia, Hx diastolic CHF w/ pulm edema & transudative L effusion 04/2015 (on Lasix20)... Her dyspnea is multifactorial w/ all these problems in addition to anxiety (on Xanax0.5mg34mt not taking regularly) & poor medication compliance (eg- still only using NEB bid)... NOTE> she called  PCP but didn't get response, Hospice stopped ~1wk ago; she wants POC...    EXAM shows Afeb, VSS, O2sat=93% on O2 at 2L/min; Wt =  131#;  HEENT- neg, mallampati1;  Chest- decr BS bilat w/ few velcro rales at bases;  Heart- RR gr1/6 SEM w/o r/g;  Abd soft, neg;  Ext- w/o c/c/e;  Neuro- weak.  CXR 06/19/16 (independently reviewed by me in the PACS system) showed COPD & incr markings both bases c/w Atx- chr changes include biapical pleural thickening, cardiomeg, Ao atherosclerosis, modHH, T12 compression fx...   LABS 06/19/16>  Chems- ok w/ HCO3=31 otherw wnl;  CBC- wnl w/ wbc=8.4;  Sed=49;  BNP=19... IMP/PLAN>>  Once again Traeh is her own worse enemy by stopping the Medrol on her own & decreasing the Nebs to Bid; we spent extra time reviewing the rationale behind the Med Rx including Medrol (rec to incr to 45m/d again), NEBS w/ albut (1/2 vial TID) followed by the Symbicort160-2spBid & Incruse once daily; asked to incr the [id number] w/ fluids, and take the Alprazolam more regularly eg- 1/2 tab tid,  +continue her O2 etc... She requests a POC so she can incr her mobility... We plan ROV in 33m58mor recheck... Note: >50% of this 74m64misit was spent in counseling and coordination of care...    Past Medical History:  Diagnosis Date  . Arthritis   . Cerebrovascular disease   . Chronic diastolic CHF (congestive heart failure) (HCC)[place]9/2017  . COPD (chronic obstructive pulmonary disease) (HCC)[place]. Depression   . Diverticulosis   . Esophageal stricture   . History of pneumonia   . History of stroke    mild oncoming  . 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 Hyperlipidemia   . Hypothyroidism   . Internal hemorrhoids   . Osteoporosis   . Pneumonia    HX of  . Positive ANA (antinuclear antibody)   . Pulmonary nodule   . Rectal ulcer   . Shortness of breath    periodically with COPD flair up  . 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  Past Surgical History:  Procedure  Laterality Date  . BLADDER SURGERY     tack  . CATARACT EXTRACTION     bilateral  . COLONOSCOPY  06/22/2011   Procedure: COLONOSCOPY;  Surgeon: DoraLafayette Dragon;  Location: [facility]COPY;  Service: Endoscopy;  Laterality: N/A;  . GASTRIC VARICES BANDING  06/22/2011   Procedure: HEMORRHOID BANDING;  Surgeon: DoraLafayette Dragon;  Location: [facility] ENDOSCOPY;  Service: Endoscopy;  Laterality: N/A;  . HEMICOLECTOMY  2012   emergent surgery [facility]  . HIATAL HERNIA REPAIR     x 2  . TONSILLECTOMY    . TOTAL ABDOMINAL HYSTERECTOMY W/ BILATERAL SALPINGOOPHORECTOMY  1998    Outpatient Encounter Prescriptions as of 06/19/2016  Medication Sig  . albuterol (PROVENTIL HFA;VENTOLIN HFA) 108 (90 BASE) MCG/ACT inhaler Inhale 2 puffs into the lungs every 6 (six) hours as needed for wheezing or shortness of breath.  . alMarland Kitchenuterol (PROVENTIL) (2.5 MG/3ML) 0.083% nebulizer solution Use 1/2 vial of ALBUTEROL in NEBULIZER three times daily...  . alendronate (FOSAMAX) 70 MG tablet Take 1 tablet (70 mg total) by mouth once a week. Take with a full glass of water on an empty stomach first thing in the morning.  . budesonide-formoterol (SYMBICORT) 160-4.5 MCG/ACT inhaler Inhale 2 puffs into the lungs 2 (two) times daily.  . furosemide (LASIX) 20 MG tablet Take 1 tablet (20 mg total) by mouth daily as needed for fluid or edema.  . halobetasol (ULTRAVATE) 0.05 % ointment Apply topically 2 (  two) times daily.    . methylPREDNISolone (MEDROL) 8 MG tablet Take 1 daily x 14 days then 1/2 tablet daily thereafter  . morphine (ROXANOL) 20 MG/ML concentrated solution Take 0.5 mLs (10 mg total) by mouth every 2 (two) hours as needed for moderate pain, severe pain, anxiety or shortness of breath.  . oxyCODONE-acetaminophen (PERCOCET) 5-325 MG tablet Take 1 to 2 tablets by mouth every 6 hours as need for pain  . umeclidinium bromide (INCRUSE ELLIPTA) 62.5 MCG/INH AEPB Inhale 1 puff into the lungs daily.  . [DISCONTINUED]  ALPRAZolam (XANAX) 0.5 MG tablet Take 1 tablet (0.5 mg total) by mouth 3 (three) times daily as needed for anxiety or sleep (air hunger. dyspnea).  . [DISCONTINUED] gabapentin (NEURONTIN) 300 MG capsule Take 1 capsule (300 mg total) by mouth 2 (two) times daily.  . [DISCONTINUED] levofloxacin (LEVAQUIN) 500 MG tablet Take 1 tablet (500 mg total) by mouth daily. (Patient not taking: Reported on 07/26/2016)  . [DISCONTINUED] levothyroxine (SYNTHROID) 75 MCG tablet Take 1 tablet (75 mcg total) by mouth daily before breakfast.  . [DISCONTINUED] methylPREDNISolone (MEDROL) 8 MG tablet Take 1 tablet two times daily x 10 days then 1 tablet daily until return  . [DISCONTINUED] mirtazapine (REMERON) 30 MG tablet Take 1 tablet (30 mg total) by mouth at bedtime.  . [DISCONTINUED] simvastatin (ZOCOR) 10 MG tablet Take 1 tablet (10 mg total) by mouth at bedtime.  . [DISCONTINUED] Vitamin D, Ergocalciferol, (DRISDOL) 50000 units CAPS capsule Take 1 capsule (50,000 Units total) by mouth every 7 (seven) days.   No facility-administered encounter medications on file as of 06/19/2016.     Allergies  Allergen Reactions  . Morphine And Related Nausea And Vomiting  . Penicillins Hives    Has patient had a PCN reaction causing immediate rash, facial/tongue/throat swelling, SOB or lightheadedness with hypotension: No Has patient had a PCN reaction causing severe rash involving mucus membranes or skin necrosis: No Has patient had a PCN reaction that required hospitalization No Has patient had a PCN reaction occurring within the last 10 years: No If all of the above answers are "NO", then may proceed with Cephalosporin use.    Immunization History  Administered Date(s) Administered  . H1N1 06/30/2008  . Influenza Split 04/18/2012  . Influenza Whole 06/10/2007, 04/14/2010, 04/18/2012  . Influenza, High Dose Seasonal PF 06/02/2015, 04/10/2016  . Influenza,inj,Quad PF,36+ Mos 04/16/2013  . Pneumococcal Conjugate-13  06/02/2015  . Pneumococcal Polysaccharide-23 05/17/2006, 04/16/2013  . Td 03/29/1999  . Tdap 04/16/2013  . Zoster 07/17/2013    Current Medications, Allergies, Past Medical History, Past Surgical History, Family History, and Social History were reviewed in [employer] record.   Review of Systems             All symptoms NEG except where BOLDED >>  Constitutional:  F/C/S, fatigue, anorexia, unexpected weight change. HEENT:  HA, visual changes, hearing loss, earache, nasal symptoms, sore throat, mouth sores, hoarseness. Resp:  cough, sputum, hemoptysis; SOB, tightness, wheezing. Cardio:  CP, palpit, DOE, orthopnea, edema. GI:  N/V/D/C, blood in stool; reflux, abd pain, distention, gas. GU:  dysuria, freq, urgency, hematuria, flank pain, voiding difficulty. MS:  joint pain, swelling, tenderness, decr ROM; neck pain, back pain, etc. Neuro:  HA, tremors, seizures, dizziness, syncope, weakness, numbness, gait abn. Skin:  suspicious lesions or skin rash. Heme:  adenopathy, bruising, bleeding. Psyche:  confusion, agitation, sleep disturbance, hallucinations, anxiety, depression suicidal.   Objective:   Physical Exam  Vital Signs:  Reviewed...   General:  WD, thin, 76 y/o WM in NAD; alert & oriented; pleasant & cooperative... HEENT:  [place]/AT; Conjunctiva- pink, Sclera- nonicteric, EOM-wnl, PERRLA, EACs-clear, TMs-wnl; NOSE-clear; THROAT-clear & wnl.  Neck:  Supple w/ fair ROM; no JVD; normal carotid impulses w/o bruits; no thyromegaly or nodules palpated; no lymphadenopathy.  Chest:  Decr BS bilat, clear w/o w/r/r heard... Heart:  Regular Rhythm; ?S4, gr1/6 SEM Abdomen:  Soft & nontender- no guarding or rebound; normal bowel sounds; no organomegaly or masses palpated. Ext:  decr ROM; without deformities +arthritic changes; no varicose veins, venous insuffic, no edema;  Pulses intact w/o bruits. Neuro:  No focal neuro deficits- pt in wheelchair & won't stand or  walk due to LBP... Derm:  No lesions noted; no rash etc. Lymph:  No cervical, supraclavicular, axillary, or inguinal adenopathy palpated.   Assessment:      IMP>>     Severe COPD> she could not perform simple spirometry when attempted 04/2015; on NEBS w/ Albut Q6H prn but it makes her too jittery, SYMBICORT160-2spBid & not using the [facility] due to $$;  REC to use Albut 1/2vial in NEB Tid regularly followed by Symbicort160-2spBid & try INCRUSE once daily => stay on these regularly...    Chronic hypoxemic and hypercarbic resp failure> O2sat was 74% on RA 10/16 and ABGs in [place] showed pCO2=73-87 w/ resp acidosis; she is now on O2 at 2.5L/min regularly & looks much better, chest is clearer and labs improved;  Ambulatory oximetry 02/2016 w/ sats in the 90s on 1.5L after 3 Laps- much improved...    Hx pneumonia> resolved- CXR 12/2015 shows COPD & is clear, no opacity...    Hx bilat pulm nodules on CT Chest> Her CXR is much improved 01/10/16; she is due for f/u CT Chest but can't get this til she is OFF Hospice!    Ex-smoker, quit 04/2015>  congrats on not smoking!    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    S/P TAH & BSO> aware    DJD, Back pain w/ compression fxs> she indicates that she "cracked my spine" about 428yrago, says she was hosp x4d w/ MRI, had brace applied- no shots or vertebroplasty (this  must have been [facility] there are no XRay reports in Epic from 2012-2016;      Osteoporosis> she had BMD 01/2015 by PCP w/ TScore -4.8 in Lspine & -3.7 in Left FemNeck; placed on Ca++, VitD OTC, and FOSAMAX70/wk started; she picked up #4 tabs 02/11/15 but never refilled the med; WE STARTED VITD 50K per week & ALENDRONATE 7044mk (Se([id number]   Hx anxiety/ depression> on XANAX 0.5mg55m prn and REMERON 15mg62m prn...    Hx poor compliance w/ medical regimen> this was before Hospice involvement in her care (04/2015)  PLAN>>  01/10/16>   Respiratory-wise she is improved, CXR is improved, etc but she needs to continue taking O2 and meds regularly- O2 at 2L/min by [place] 24/7, NEBS w/ Albut using 1/2 vial TID every day followed by SYMBICORT160- 2spBid and new INCRUSE one inhalation daily;  XRay of Lspine shows new compression in L2, and Epic review showed that she only took Alendronate70 for 4 wks in Aug2016- she never refilled the Rx;  Discussed CXR & Lumbar films w/ Sister-in-law care giver Heidi [name]=> REC that [place] re-start Calcium/ VitD/ ALENDRONATE70/wk & stay on this (she never did); she has a back brace at home & pain meds from Hospice, needs PT for ambulation;  We decreased her Synthroid to 156mg/d as well... ROV planned in 6wks time. 02/23/16>   JElnerhas stabilized from the cardiopulm standpoint & she is instructed to continue her current meds regularly (Symbicort & Incruse) plus her NEBS, VentHFA, & Lasix20 as needed;  Looks like she needs CDoppler f/u, f/u FLP, management of her thyroid medication to insure compliance w/ Rx (we cut her to 740m today), and management of her osteoporosis/ bone building therapy & her back pain due to compression fxs => note sent to her PCP... 04/10/16>   JuMandeeeeds to continue her O2, Pred10, NEBS, Symbicort, Incruse regularly;  In addition she needs to stay on the Synthroid7546md dose regularly;  And finally needs VitD50K & Alendronate70 both weekly for her  bones... 05/11/16>   Heidi [name] been using her NEBULIZER just prn & averaging once qod or so- we discussed a regular treatment regimen that includes using her NEB w/ 1/2 vial of Albut Tid regularly followed by her Symbicort160-2spBid & the Incruse once daily;  We will restart a systemic steroid med- try MEDROL 8mg41mbs (one Bid for 10d then down to 1Qam til return);  In addition she will increase her Alprazolam to 1 tab Tid regularly (she's been on 1Bid but still anxious);  Keep other meds the same including Alendronate70 once per week, Vit D 50K once per week, and Synthroid75 daily 1st thing in the Am;  We plan ROV recheck in 3wks time... 06/05/16>   Add on appt per Hospice nurse who heard rales at [facility], pt is not acutely symptomatic- but is concerned about 7# wt gain on Medrol8 so we decided to decr this to 1/2 tab Qam; otherw continue same meds & we plan ROV in 55mo.42mo2/4/17>   Once again Kella Ferreller own worse enemy by stopping the Medrol on her own & decreasing the Nebs to Bid; we spent extra time reviewing the rationale behind the Med Rx including Medrol (rec to incr to 8mg/d81main), NEBS w/ albut (1/2 vial TID) followed by the Symbicort160-2spBid & Incruse once daily; asked to incr the Mucinex1200mg B20m/ fluids, and incr the Alprazolam 1/2 tab tid, +continue her O2 etc... She requests a POC so she can incr her mobility...     Plan:     Patient's Medications  New Prescriptions   METHYLPREDNISOLONE (MEDROL) 8 MG TABLET    Take 1 daily x 14 days then 1/2 tablet daily thereafter  Previous Medications   ALBUTEROL (PROVENTIL HFA;VENTOLIN HFA) 108 (90 BASE) MCG/ACT INHALER    Inhale 2 puffs into the lungs every 6 (six) hours as needed for wheezing or shortness of breath.   ALBUTEROL (PROVENTIL) (2.5 MG/3ML) 0.083% NEBULIZER SOLUTION    Use 1/2 vial of ALBUTEROL in NEBULIZER three times daily...   ALENDRONATE (FOSAMAX) 70 MG TABLET  Take 1 tablet (70 mg total) by mouth once a week. Take with a full  glass of water on an empty stomach first thing in the morning.   BUDESONIDE-FORMOTEROL (SYMBICORT) 160-4.5 MCG/ACT INHALER    Inhale 2 puffs into the lungs 2 (two) times daily.   FUROSEMIDE (LASIX) 20 MG TABLET    Take 1 tablet (20 mg total) by mouth daily as needed for fluid or edema.   HALOBETASOL (ULTRAVATE) 0.05 % OINTMENT    Apply topically 2 (two) times daily.     MORPHINE (ROXANOL) 20 MG/ML CONCENTRATED SOLUTION    Take 0.5 mLs (10 mg total) by mouth every 2 (two) hours as needed for moderate pain, severe pain, anxiety or shortness of breath.   OXYCODONE-ACETAMINOPHEN (PERCOCET) 5-325 MG TABLET    Take 1 to 2 tablets by mouth every 6 hours as need for pain   UMECLIDINIUM BROMIDE (INCRUSE ELLIPTA) 62.5 MCG/INH AEPB    Inhale 1 puff into the lungs daily.  Modified Medications   Modified Medication Previous Medication   ALPRAZOLAM (XANAX) 0.5 MG TABLET ALPRAZolam (XANAX) 0.5 MG tablet      TAKE ONE TABLET BY MOUTH THREE TIMES DAILY AS NEEDED FOR ANXIETY OR SLEEP (AIR  HUNGER.  DYSPNEA).    Take 1 tablet (0.5 mg total) by mouth 3 (three) times daily as needed for anxiety or sleep (air hunger. dyspnea).   GABAPENTIN (NEURONTIN) 300 MG CAPSULE gabapentin (NEURONTIN) 300 MG capsule      Take 1 capsule (300 mg total) by mouth 2 (two) times daily.    Take 1 capsule (300 mg total) by mouth 2 (two) times daily.   LEVOTHYROXINE (SYNTHROID) 75 MCG TABLET levothyroxine (SYNTHROID) 75 MCG tablet      Take 1 tablet (75 mcg total) by mouth daily before breakfast.    Take 1 tablet (75 mcg total) by mouth daily before breakfast.   MIRTAZAPINE (REMERON) 30 MG TABLET mirtazapine (REMERON) 30 MG tablet      Take 1 tablet (30 mg total) by mouth at bedtime.    Take 1 tablet (30 mg total) by mouth at bedtime.   SIMVASTATIN (ZOCOR) 10 MG TABLET simvastatin (ZOCOR) 10 MG tablet      Take 1 tablet (10 mg total) by mouth at bedtime.    Take 1 tablet (10 mg total) by mouth at bedtime.   VITAMIN D, ERGOCALCIFEROL,  (DRISDOL) 50000 UNITS CAPS CAPSULE Vitamin D, Ergocalciferol, (DRISDOL) 50000 units CAPS capsule      Take 1 capsule (50,000 Units total) by mouth every 7 (seven) days.    Take 1 capsule (50,000 Units total) by mouth every 7 (seven) days.  Discontinued Medications   ALPRAZOLAM (XANAX) 0.5 MG TABLET    Take 1 tablet (0.5 mg total) by mouth 3 (three) times daily as needed for anxiety or sleep (air hunger. dyspnea).   LEVOFLOXACIN (LEVAQUIN) 500 MG TABLET    Take 1 tablet (500 mg total) by mouth daily.   METHYLPREDNISOLONE (MEDROL) 8 MG TABLET    Take 1 tablet two times daily x 10 days then 1 tablet daily until return   MIRTAZAPINE (REMERON) 30 MG TABLET    Take 1 tablet (30 mg total) by mouth at bedtime.

## 2016-08-18 ENCOUNTER — Telehealth: Payer: Self-pay | Admitting: Pulmonary Disease

## 2016-08-18 NOTE — Telephone Encounter (Signed)
Spoke with Heidi [name]. Advised her that we do not have pt on Ambien. Remeron has already been refilled on 08/14/16. Heidi [name] verbalized understanding. Nothing further was needed.

## 2016-08-21 ENCOUNTER — Telehealth: Payer: Self-pay | Admitting: Pulmonary Disease

## 2016-08-21 MED ORDER — LORAZEPAM 1 MG PO TABS: 0.5000 mg | ORAL_TABLET | Freq: Three times a day (TID) | ORAL | 2 refills | Status: DC | PRN

## 2016-08-21 NOTE — Telephone Encounter (Signed)
Spoke with pt. She is aware of the medication change. Rx has been called in. Nothing further was needed. 

## 2016-08-21 NOTE — Telephone Encounter (Signed)
Spoke with patient daughter Jan, states the patient is needing an adjustment to her medications. Pt is having a hard time sleeping and is needing Lorazepam called into the pharmacy. Jan states that the patient was getting Xanax 0.5mg , Lorazepam 0.5mg , Ambien, and Remeron all called in while under Hospice care and is now only getting Remeron and Xanax. Jan states that the patient is not sleeping well and is "on edge" all the time and is requesting that Lorazepam be called in for either in replacement of or in addition to her Xanax.  Please advise Dr Lenna Gilford. Thanks.   Allergies as of 08/21/2016      Reactions   Morphine And Related Nausea And Vomiting   Penicillins Hives   Has patient had a PCN reaction causing immediate rash, facial/tongue/throat swelling, SOB or lightheadedness with hypotension: No Has patient had a PCN reaction causing severe rash involving mucus membranes or skin necrosis: No Has patient had a PCN reaction that required hospitalization No Has patient had a PCN reaction occurring within the last 10 years: No If all of the above answers are "NO", then may proceed with Cephalosporin use.      Medication List       Accurate as of 08/21/16  3:15 PM. Always use your most recent med list.          albuterol 108 (90 Base) MCG/ACT inhaler Commonly known as:  PROVENTIL HFA;VENTOLIN HFA Inhale 2 puffs into the lungs every 6 (six) hours as needed for wheezing or shortness of breath.   albuterol (2.5 MG/3ML) 0.083% nebulizer solution Commonly known as:  PROVENTIL Use 1/2 vial of ALBUTEROL in NEBULIZER three times daily...   alendronate 70 MG tablet Commonly known as:  FOSAMAX Take 1 tablet (70 mg total) by mouth once a week. Take with a full glass of water on an empty stomach first thing in the morning.   ALPRAZolam 0.5 MG tablet Commonly known as:  XANAX Take 1 tablet (0.5 mg total) by mouth 2 (two) times daily as needed for anxiety.   budesonide-formoterol 160-4.5 MCG/ACT  inhaler Commonly known as:  SYMBICORT Inhale 2 puffs into the lungs 2 (two) times daily.   furosemide 20 MG tablet Commonly known as:  LASIX Take 1 tablet (20 mg total) by mouth daily as needed for fluid or edema.   gabapentin 300 MG capsule Commonly known as:  NEURONTIN Take 1 capsule (300 mg total) by mouth 2 (two) times daily.   levothyroxine 75 MCG tablet Commonly known as:  SYNTHROID Take 1 tablet (75 mcg total) by mouth daily before breakfast.   methylPREDNISolone 8 MG tablet Commonly known as:  MEDROL Take 1 daily x 14 days then 1/2 tablet daily thereafter   mirtazapine 30 MG tablet Commonly known as:  REMERON Take 1 tablet (30 mg total) by mouth at bedtime.   morphine 20 MG/ML concentrated solution Commonly known as:  ROXANOL Take 0.5 mLs (10 mg total) by mouth every 2 (two) hours as needed for moderate pain, severe pain, anxiety or shortness of breath.   oxyCODONE-acetaminophen 5-325 MG tablet Commonly known as:  PERCOCET Take 1 to 2 tablets by mouth every 6 hours as need for pain   simvastatin 10 MG tablet Commonly known as:  ZOCOR Take 1 tablet (10 mg total) by mouth at bedtime.   ULTRAVATE 0.05 % ointment Generic drug:  halobetasol Apply topically 2 (two) times daily.   umeclidinium bromide 62.5 MCG/INH Aepb Commonly known as:  INCRUSE ELLIPTA Inhale 1  puff into the lungs daily.   Vitamin D (Ergocalciferol) 50000 units Caps capsule Commonly known as:  DRISDOL Take 1 capsule (50,000 Units total) by mouth every 7 (seven) days.      Allergies  Allergen Reactions  . Morphine And Related Nausea And Vomiting  . Penicillins Hives    Has patient had a PCN reaction causing immediate rash, facial/tongue/throat swelling, SOB or lightheadedness with hypotension: No Has patient had a PCN reaction causing severe rash involving mucus membranes or skin necrosis: No Has patient had a PCN reaction that required hospitalization No Has patient had a PCN reaction  occurring within the last 10 years: No If all of the above answers are "NO", then may proceed with Cephalosporin use.

## 2016-08-21 NOTE — Telephone Encounter (Signed)
Per SN---  Let's use lorazepam 1 mg  #90  1/2 to 1 tablet by mouth TID prn ( we will cancel the xanax)  thanks

## 2016-09-11 ENCOUNTER — Other Ambulatory Visit: Payer: Self-pay | Admitting: Adult Health

## 2016-09-11 NOTE — Telephone Encounter (Signed)
She should have refills left?

## 2016-09-15 ENCOUNTER — Other Ambulatory Visit: Payer: Self-pay | Admitting: Adult Health

## 2016-09-15 NOTE — Telephone Encounter (Signed)
Ok to refill 

## 2016-11-10 ENCOUNTER — Telehealth: Payer: Self-pay | Admitting: Pulmonary Disease

## 2016-11-10 IMAGING — DX DG LUMBAR SPINE COMPLETE 4+V
5 series · 5 of 5 positions shown · non-contrast
Comparison: Chest CT February 22, 2015 with bony reformats ; abdominal
radiograph June 11, 2008

CLINICAL DATA: Fall 6 weeks prior with persistent lumbago

EXAM:
LUMBAR SPINE - COMPLETE 4+ VIEW

[l-spine ap]
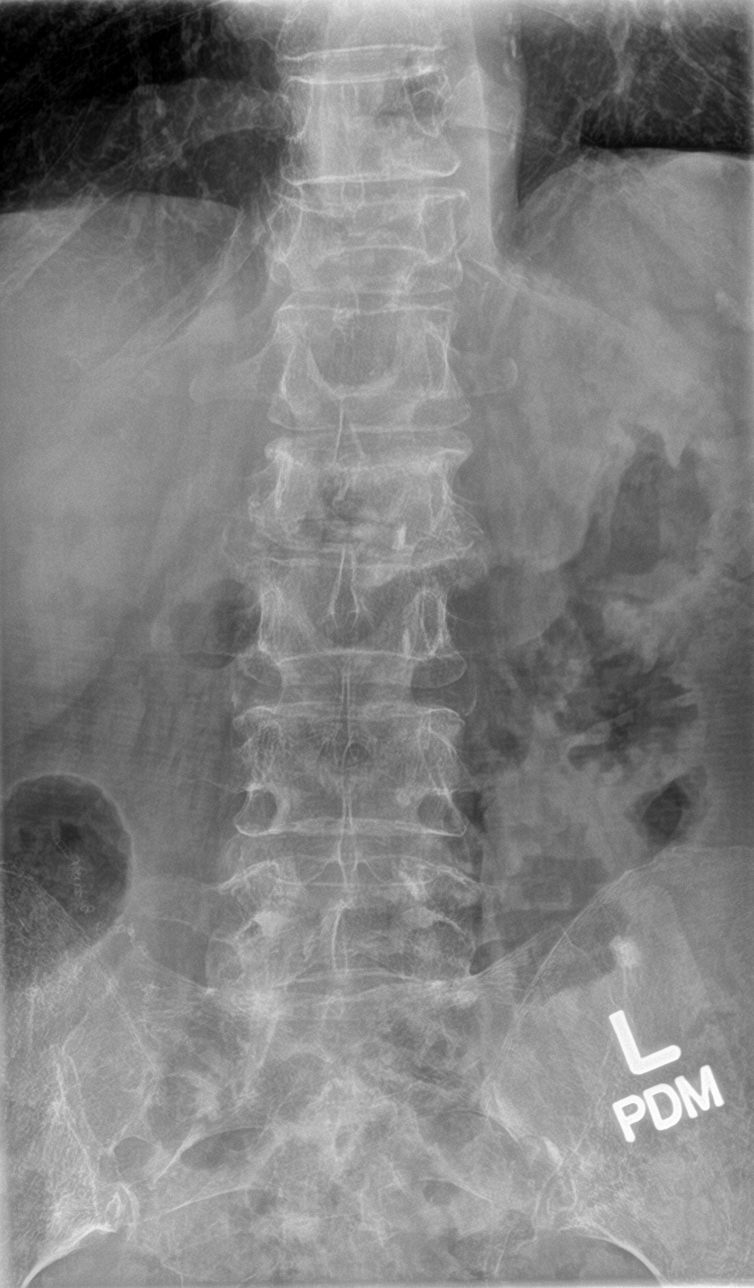

[l-spine obl (1 of 2)]
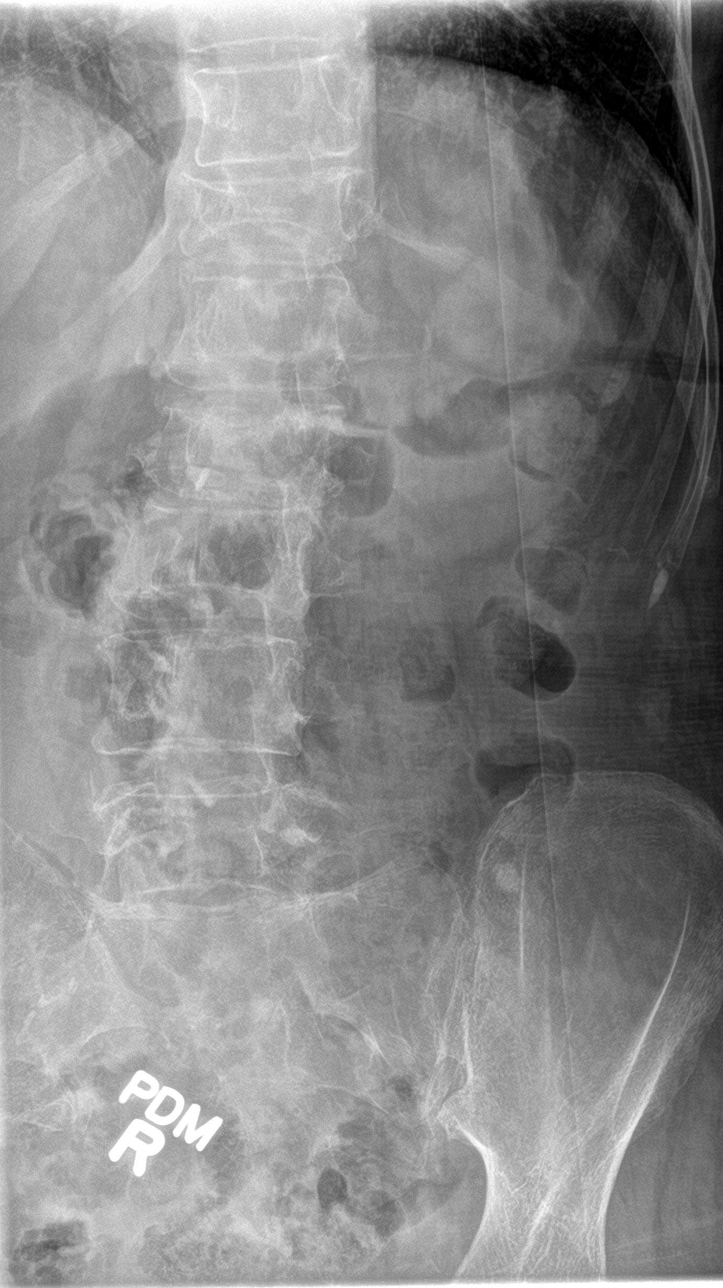

[l-spine obl (2 of 2)]
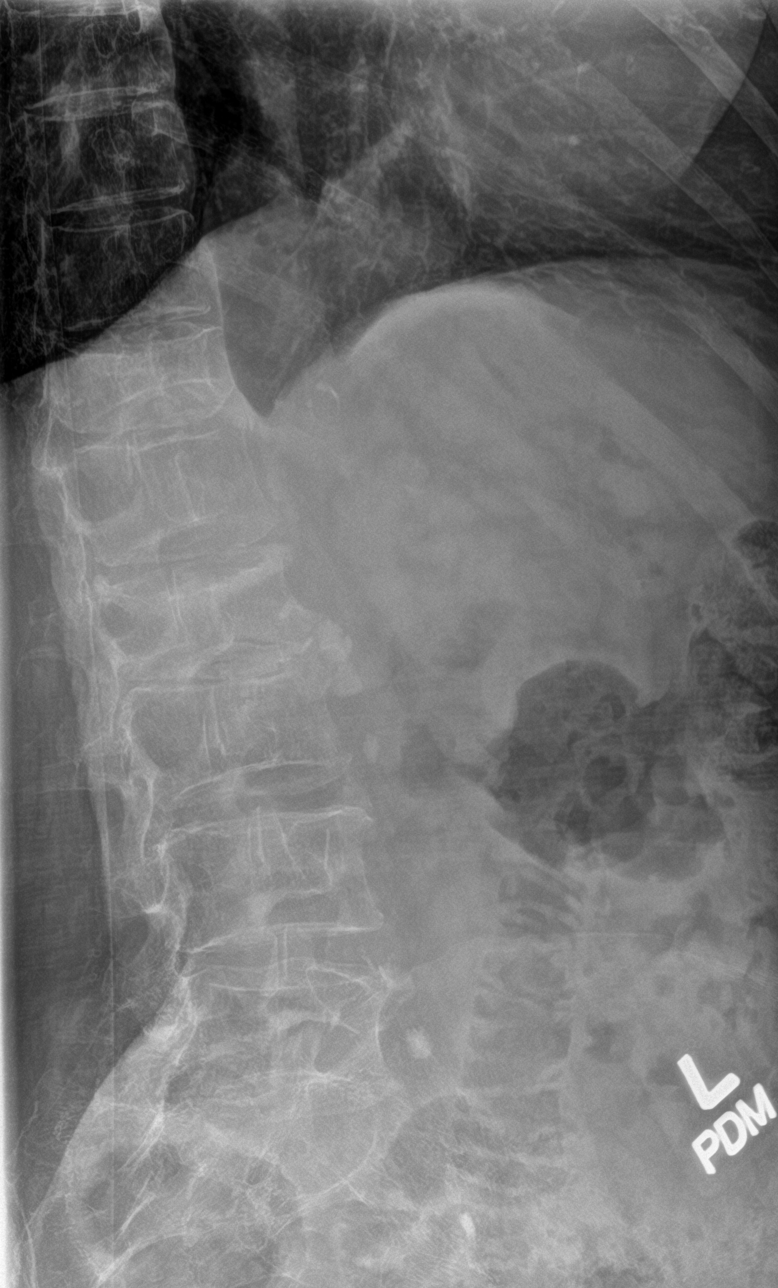

[l-spine lat]
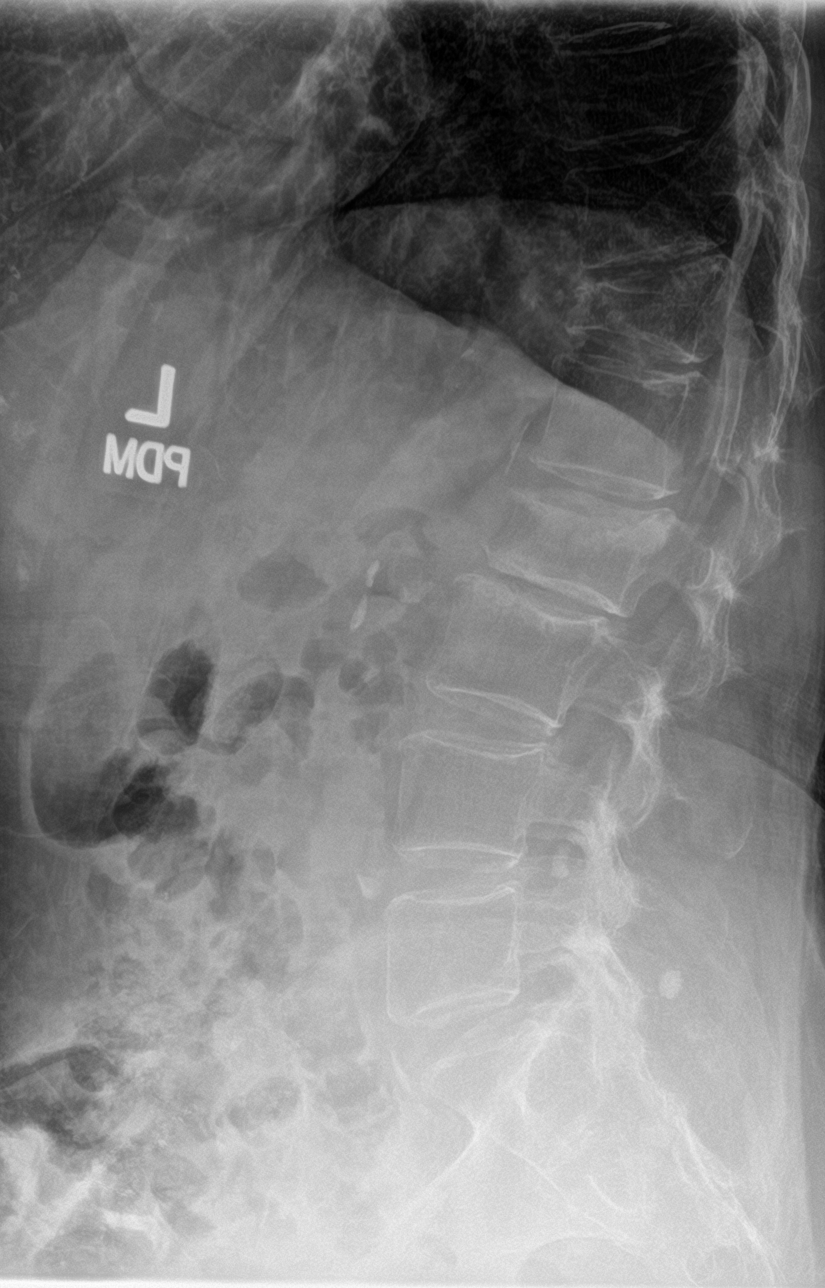

[l-spine spot]
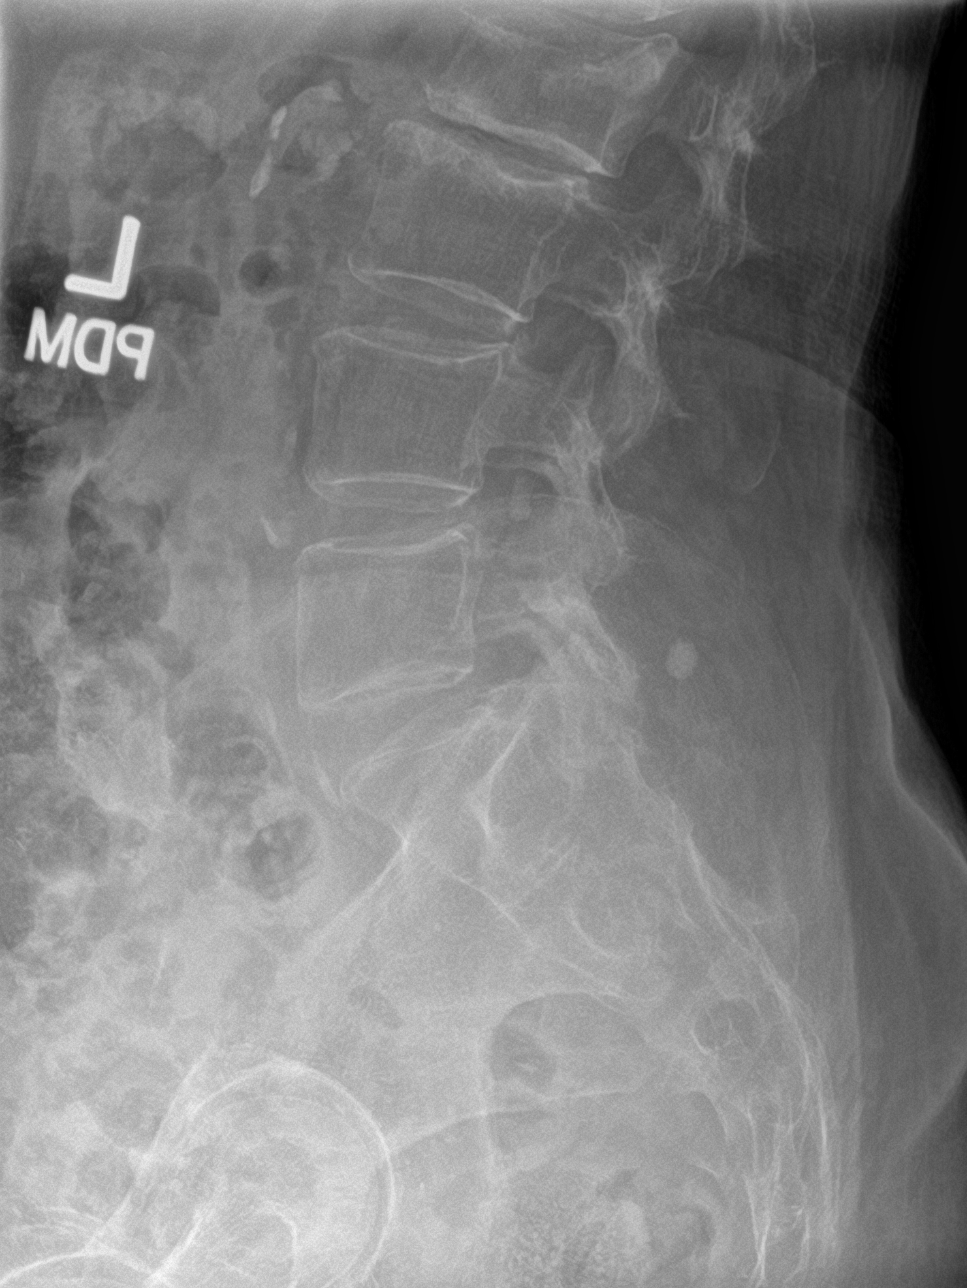

[5 of 5 positions shown; findings below may reference images not displayed]

FINDINGS: Frontal, lateral, spot lumbosacral lateral, and bilateral oblique
views were obtained. There are 5 non-rib-bearing lumbar type
vertebral bodies. There is anterior wedging of the L2 vertebral
body, not present on prior CT examination. There is an old fracture
of the T12 vertebral body. No other fractures are evident. No
spondylolisthesis. There is moderate disc space narrowing at L2-3.
There is mild disc space narrowing at L3-4. There is facet
osteoarthritic change at L5-S1 bilaterally. A small sclerotic focus
in the superior left iliac crest medially may represent a bone
island it was not present on prior abdominal radiograph from 5889,
however. There are scattered foci of atherosclerotic calcification
in the aorta.
IMPRESSION: Age uncertain anterior wedge compression fracture of the L2
vertebral body. This fracture was not present on prior CT performed
approximately 10 months prior. There is a stable prior fracture of
the T12 vertebral body. No spondylolisthesis. Areas of
osteoarthritic change.

There is a sclerotic focus in the superior medial left iliac crest.
Suspect bone island, although this focus was not present on prior
abdominal radiograph from 5889.

Aortic atherosclerotic change.

## 2016-11-10 MED ORDER — LEVOFLOXACIN 500 MG PO TABS: 500.0000 mg | ORAL_TABLET | Freq: Every day | ORAL | 0 refills | Status: DC

## 2016-11-10 NOTE — Telephone Encounter (Signed)
Per SN--- levaquin 500 mg #7  1 daily otc mucinex 600 mg 1 po QID with increase in fluids.   Called and spoke with [name] and she is aware of SN recs.

## 2016-11-10 NOTE — Telephone Encounter (Signed)
[name] returning call - she can be reached at [phone] -pr

## 2016-11-10 NOTE — Telephone Encounter (Signed)
lmomtcb x 1 for [name].

## 2016-11-10 NOTE — Telephone Encounter (Signed)
Called and spoke with Heidi [name] and she stated that she feels the pt might need abx. She stated that the pt has had a cold for about 2 days.  Today the pt has been coughing up small amount of blood in her sputum that is yellow.  Pt was started on mucinex and denies any fever or chills.  Her sides do hurt from coughing.  SN please advise. Thanks  Allergies  Allergen Reactions  . Morphine And Related Nausea And Vomiting  . Penicillins Hives    Has patient had a PCN reaction causing immediate rash, facial/tongue/throat swelling, SOB or lightheadedness with hypotension: No Has patient had a PCN reaction causing severe rash involving mucus membranes or skin necrosis: No Has patient had a PCN reaction that required hospitalization No Has patient had a PCN reaction occurring within the last 10 years: No If all of the above answers are "NO", then may proceed with Cephalosporin use.

## 2016-11-13 ENCOUNTER — Ambulatory Visit (INDEPENDENT_AMBULATORY_CARE_PROVIDER_SITE_OTHER): Payer: Medicare Other | Admitting: Pulmonary Disease

## 2016-11-13 ENCOUNTER — Encounter: Payer: Self-pay | Admitting: Pulmonary Disease

## 2016-11-13 VITALS — BP 110/60 | HR 108 | Temp 96.8°F | Ht 64.5 in | Wt 130.1 lb

## 2016-11-13 DIAGNOSIS — J9611 Chronic respiratory failure with hypoxia: Secondary | ICD-10-CM | POA: Diagnosis not present

## 2016-11-13 DIAGNOSIS — J449 Chronic obstructive pulmonary disease, unspecified: Secondary | ICD-10-CM

## 2016-11-13 DIAGNOSIS — G4700 Insomnia, unspecified: Secondary | ICD-10-CM | POA: Diagnosis not present

## 2016-11-13 DIAGNOSIS — I5032 Chronic diastolic (congestive) heart failure: Secondary | ICD-10-CM | POA: Diagnosis not present

## 2016-11-13 DIAGNOSIS — I679 Cerebrovascular disease, unspecified: Secondary | ICD-10-CM

## 2016-11-13 DIAGNOSIS — F411 Generalized anxiety disorder: Secondary | ICD-10-CM | POA: Diagnosis not present

## 2016-11-13 DIAGNOSIS — E039 Hypothyroidism, unspecified: Secondary | ICD-10-CM

## 2016-11-13 NOTE — Progress Notes (Signed)
Subjective:     Patient ID: Heidi [name], female   DOB: [birth year], 76 y.o.   MRN: [id number]  HPI 76 y/o WF former smoker who quit 2016 w/ severe COPD/emphysema & chronic hypoxemic and hypercarbic resp failure, placed on Hospice by DrSwords/ DrBurchette in 2016, hx pneumonia and small bilat pulm nodules on CT Chest, and diastolic CHF...   ~  May 13, 2015:  Initial pulmonary consult by SN>        Heidi [name] was [name] 10/3 - 04/27/15 by [facility] after presenting via EMS w/ cough, weakness, increased SOB; she was still smoking, not on home O2; found to have COPD exac & CAP w/ bibasilar opacities, acute on chronic hypoxemic & hypercarbic resp failure, & superimposed CHF w/ bilat L>R pleural effusions (this was tapped 10/9 yielding transudative fluid, neg cytology, neg culture)... She was seen by Chuichu attended her in the hosp; Treated w/ Rocephin/ Zithromax, Solumedrol, Nebs, & brief stay in step-down unit on BiPap; disch on ADVAIR100-1spBid, SPIRIVA daily, PRED taper, NEBS w/ Albut... She was also treated for acute on chronic diastolic CHF- mod dil RV & reduced RV sys function (w/ preserved LVF) on 2DEcho & diuresed w/ Lasix but only disch on 38m prn for edema... Hospice was consulted (DrGolding's note reviewed) and she has entered the Hospice program (she is a DNR, DNI) w/ a prognosis est <681mo she was disch on ROXANOL 2012ml--take 1/2 ml Q2H as needed for pain, SOB, anxiety...  She last saw DrSwords 11/2013, she has established new PCP- CorDorothyann PengDrBurchette- has post hosp f/u pending; Note> she cancelled 2 appts w/ DrMcQuaid recently...       Currently Heidi [name] is very stoic- states she's not having any breathing problems at the present time & credits the thoracentesis w/ her remarkable improvement; her last cigarette was 04/19/15 prior to her admission; she was disch on O2 at 4L/min & has an O2sat=89% on this in the office today (RA sat was 74%); she was unable to perform PFT today; we had  a frank discussion about her severe, end-stage COPD...       Epic records indicate that she was seen by DrWert in 2011> on Spiriva +/- Advair, stated she quit smoking 2011 on adm for COPD exac, PFT reported FEV1=1.32 (77%), FEV1/FVC ratio=50%, DLCO=70%; CXR showed COPD & biapical pleuroparenchymal scarring, CT Chest 4/11 showed ?mult pulm nodules ?early cavitation r/o MAI, cultures- only NTF, O2sat=95% on RA...  Medical Hx>   She had an annual wellness exam from her PCP 02/2015> COPD, Hx pulm nodule, Hx of pneumonia; Hypothyroidism; adenomatous colonic polyps; Hyperlipidemia; Sigmoid volvulus; Depression; Cerebrovascular disease; Positive ANA; Diverticulosis; Esophageal stricture; Arthritis; History of stroke; Internal hemorrhoids; and Rectal ulcer; protein-calorie malnutrition (she weighs 78 lbs...  EXAM shows Afeb, VSS, O2sat=89% on O2 at 4L/min; Wt=78#, 5'2"Tall, BMI=16;  Heent- neg, mallampati1;  Chest- decr BS bilat, scat rhonchi, & end-exp wheezing;  Heart- RR gr1/6 SEM w/o r/g;  Abd soft, neg;  Ext- w/o c/c/ tr edema...   Baseline CXR 12/31/14 showed hyperinflation c/w COPD, biapical pleuroparenchymal scarring, nipple shadows and T12 compression fx...  CT Chest 02/22/15 showed cardiomeg, atherosclerotic changes in Ao & coronaries, small right effusion; emhysema w/ several additional findings- RUL & lingular subpleural opacities, & 5mm14mL & RLL nodules; osteopenia and T7 +T12 compression fractures...   CXRs performed during the 04/2015 Admission showed cardiomeg, CHF w/ left effusion, COPD w/ interstitial edema & LLL opac => improved post thoracentesis  10/9 w/ 400cc removed...  ABGs during the 10/16 Hosp showed pCO2 in the 73-87 range w/ resp acidosis...   LABS 10/16 reviewed> CBC- ok w/ Hg=13 range;  Chems- note HCO3=44-45 range & she may benefit drom Acetazolamide added to her diuretic regimen if more aggressive treatment is desired...  IMP>> Heidi [name] has severe COPD w/ acute on chronic hypoxemic &  hypercarbic resp failure & cor pulmonale; her last cigarette was the day of admission 04/19/15... She also has a hx of poor compliance w/ med rx & she has once again stopped the prescribed meds perceiving that she is much better since the thoracentesis; she was referred to Hospice for outpt follow up & will continue to see her PCPs at [facility]; I told her I would be happy to recheck at any time if she so desired...  NOTE> since her disch she states that her breathing has been "so much better" that she hasn't needed her inhalers or the nebulizer, and she's off the Pred; she states she is taking the ASA81, Lasix20, Synthroid137, Xanax, and Remeron; she apparently hasn't needed the Roxanol yet...  We had a frank discussion about her end-stage disease & we "proved it to her" by checking her O2sat off the oxygen (74%), and attempting to get a Spirometry tracing (she couldn't do it);  Pt is asked to get back on her meds & we outlined a regimen>  NEBS w/ Albut Qid (breakfast, lunch, dinner, & bedtime);  ADVAIR100- 2spBid (taken after the Neb at breakfast & dinner);  SPIRIVA via handihaler once daily (taken after the Neb at lunch)...   ~  January 10, 2016:  8mo ROV w/ SN>  NOTE: family has moved her into her own apt 12/2015) next to her Sister-in-law/ care giver's apt      Heidi [name] returns for a follow up visit- she has severe COPD/emphysema w/ chronic hypoxemic and hypercarbic resp failure; she was placed on [facility] by DrBurchette in Oct2016; she has been followed by NP-CoryNafziger since then;  She returns today w/ her sister-in-law care giver Nancy #[phone] indicating that Hospice is planning to stop services soon but pt is c/o severe LBP x 6wks ever since she lifted a chlorox bottle & felt a "pop" in her back; she has mentioned this to Hospice but all they have done is to adjust her pain meds (Percocet5-325 & Roxanol 20mg/ml- 0.5ml Q2H prn) without further eval;  She states that her breathing is  OK- meaning she could get about in her apt before the episode of acute LBP 2wks ago;I note that she has not had any lab work or f/u CXR since her disch from the Hosp 04/2015 & she is clearly in need of same- she has a hx of compression fx of spine and osteoporosis but not on any bone building therapy... We reviewed the following medical problems during today's office visit >>     Severe COPD> she could not perform simple spirometry when attempted 04/2015; on NEBS w/ Albut Q6H prn but it makes her too jittery, SYMBICORT160-2spBid & not using the SPIRIVA due to $$;  REC to use Albut 1/2vial in NEB Tid regularly followed by Symbicort160-2spBid & try INCRUSE once daily => we plan ROV in 6wks w/ repeat spirometry & oximetry if she can walk...     Chronic hypoxemic and hypercarbic resp failure> O2sat was 74% on RA 10/16 and ABGs in Hosp showed pCO2=73-87 w/ resp acidosis; she is now on O2 at 2.5L/min regularly &   looks much better, chest is clearer and labs improved; we will check ambulatory oximetry on ret if able to walk for [place]...    Hx pneumonia> resolved- CXR 12/2015 shows COPD & is clear, no opacity...    Hx bilat pulm nodules on CT Chest> Her CXR is much improved 01/10/16; she will be due for a f/u CT Chest on return 02/2016...    Ex-smoker, quit 04/2015>  congrats on not smoking!    Diastolic CHF w/ pulm edema & transudative left pleural effusion 04/2015> on Lasix20 prn only    Hx cerebrovasc dis & stroke> this must have occurred in [place]- no data in EPIC & no prev CDopplers to review; rec to take ASA81/d & we will check CDopplers later...    MEDICAL Issues>  Hyperlipidemia (not on meds),  Hypothyroidism (on Synthroid137/d),  Prot-cal malnutrition (wt is up 38# & BMI=21 now),      Hx HH repair & esoph stricture> not on PPI rx at present    Hx colon polyps, divertics, hems (w/ banding), & cecal volvulus s/p right hemicolectomy at [facility] 2012> aware...    S/P TAH & BSO> aware    DJD, Back pain w/  compression fxs> she indicates that she "cracked my spine" about 67yr ago, says she was hosp x4d w/ MRI, had brace applied- no shots or vertebroplasty ([facility] in Epic from [facility]-2016;      Osteoporosis> she had BMD 01/2015 by PCP w/ TScore -4.8 in Lspine & -3.7 in Left FemNeck; placed on Ca++, VitD OTC, and FOSAMAX70/wk started; she picked up #4 tabs 02/11/15 but never refilled the med...    Hx anxiety/ depression> on XANAX 0.571mid prn and REMERON 1559mhs prn...    Hx poor compliance w/ medical regimen> this was before Hospice involvement in her care (04/2015) EXAM shows Afeb, VSS, O2sat=97% on O2 at 2.5L/min; Wt was 116# in [id number] 38# to BMI=21);  Heent- neg, mallampati1;  Chest- decr BS bilat w/o w/r/r;  Heart- RR gr1/6 SEM w/o r/g;  Abd soft, neg;  Ext- w/o c/c/e in wheelchair & won't stand/walk due to LBP...  CXR 01/10/16>  Much improved- some hyperinflation, bilat apical pleural thickening & bibasilar scarring, no effusion/ edema/ consolidation/ adenopathy, stable ant wedging of T12 w/ kyphosis...   Lumbar spine XRay 01/10/16>  Anterior wedging of L2 is new, old fx of T12, no other fxs & no spondylolithesis- disc sp narrowing L2-3 & L3-4, facet arthritis L5-S1, calcif in abd Ao...  LABS 12/2015>  Chems- wnl, HCO3=32, renal=wnl;  CBC- wnl;  TSH=0.01 on synthroid137;  BNP=70;  Sede=3 IMP/PLAN>>  Respiratory-wise she is improved, CXR is improved, etc but she needs to continue taking O2 and meds regularly- O2 at 2L/min by [place] 24/7, NEBS w/ Albut using 1/2 vial TID every day followed by SYMBICORT160- 2spBid and new INCRUSE one inhalation daily;  XRay of Lspine shows new compression in L2, and Epic review showed that she only took Alendronate70 for 4 wks in Aug2016- she never refilled the Rx;  Discussed CXR & Lumbar films w/ Sister-in-law care giver Nancy=> REC that Leanza re-start Calcium/ VitD/ ALENDRONATE70/wk & stay on this; she has a back  brace at home & pain meds from Hospice, needs PT for ambulation;  Second issue is her oversuppressed TSH on Synth137=> REC decr dose to 112 & recheck TSH on return... ROV planned in 6wks time.  ~  February 23, 2016:  6wk ROV & f/u w/ SN>  Celene has done remarkably well from the pulm standpoint- stabilized and improved suffic to be able to move into her own appt & care for herself w/ family support nearby; she says she feels like she's getting better too; she was working w/ PT at home but she thinks they were working her too hard & she sent them away- I explained that this therapy is the MOST IMPORTANT thing for her continued recovery!  She tells me that this is the last month of her yr on Hospice, she has Roxanol on her med list but thank goodness she hasn't needed it!  She is in a wheelchair in the office today- but tells me she is up & about at home=> we will walk her today & check O2sats...    1) Severe COPD> she could not perform simple spirometry when attempted 04/2015; on NEBS w/ Albut Q6H prn & using 1/2 vial 2-3x per wk, SYMBICORT160-2spBid & Incruse once daily; Hx medication noncompliance & not using the NEBS Tid as requested!     2) Chronic hypoxemic and hypercarbic resp failure> O2sat was 74% on RA 10/16 and ABGs in Hosp showed pCO2=73-87 w/ resp acidosis; she is now on O2 at 2.5L/min regularly & looks much better, chest is clearer and labs improved; ambulatory oximetry 02/2016 showed no desaturation on 2.5L/min...     3) Hx pneumonia> resolved- CXR 12/2015 shows COPD & clear, no opacity...    4) Hx bilat pulm nodules on CT Chest> Her CXR is much improved 01/10/16; she will be due for a f/u CT Chest to check these nodules soon...    5) Ex-smoker, quit 04/2015>  congrats on not smoking!    6) Diastolic CHF w/ pulm edema & transudative left pleural effusion 04/2015> on Lasix20 prn only now    7) Hx cerebrovasc dis & stroke> this must have occurred in [place]- no data in EPIC & no prev CDopplers to  review; rec to take ASA81/d & she will need CDopplers from her PCP on ret...    8) MEDICAL Issues>  Hyperlipidemia (not on meds),  Hypothyroidism (on Synthroid112/d),  Prot-cal malnutrition (wt is up to 114# & BMI=20 now); I reviewed all her TSH readings and called her Pharm ([facility] in [place]) & they confirmed poor med compliance in past- eery time her dose was increased she was NOT taking her meds regularly!  She is now on 112mcg/d and pill counts are accurate- TSH=0.02 so this dose is still too high for her; WE WILL DECR DOSE TO 75mcg/d and turn over follow up of this problem to her PCP- DrCory & DrBurchette...    9) Hx HH repair & esoph stricture> not on PPI rx at present    10) Hx colon polyps, divertics, hems (w/ banding), & cecal volvulus s/p right hemicolectomy at [place] 2012> aware...    11) S/P TAH & BSO> aware    12) DJD, Back pain w/ compression fxs> she indicates that she "cracked my spine" about 4yrs ago, says she was hosp x4d w/ MRI, had brace applied- no shots or vertebroplasty (this must have been done at [facility] as there are no XRay reports in Epic from 2012-2016; she was taking Roxanol for pain but will be leaving hospice soon & her PCP will assume care of her osteoporosis, back pain=> pain meds and bone building therapy.    13) Osteoporosis> she had BMD 01/2015 by PCP w/ TScore -4.8 in Lspine & -3.7 in   Left FemNeck; placed on Ca++, VitD OTC, and FOSAMAX70/wk started; she picked up #4 tabs 02/11/15 but never refilled the med...    14) Hx anxiety/ depression> on XANAX 0.5mgTid prn and REMERON 15mg Qhs prn...    15) Hx poor compliance w/ medical regimen> this was before Hospice involvement in her care (04/2015) EXAM shows Afeb, VSS, O2sat=97% on O2 at 1.5L/min; Wt = 114#, BMI=21;  Heent- neg, mallampati1;  Chest- decr BS bilat w/o w/r/r;  Heart- RR gr1/6 SEM w/o r/g;  Abd soft, neg;  Ext- w/o c/c/e in wheelchair today...  Ambulatory oximetry 02/23/16>  O2sat=94% on 1.5L/min at rest  w/ pulse=82/min;  She ambulated 3 Laps pushing her wheelchair w/ lowest O2sat=91% w/ HR=110/min...  LABS 02/23/16 showed TSH=0.02 on Levothy 112mcg/d taken regularly (confirmed by [facility] [place])... IMP/PLAN>>  Neshia has stabilized from the cardiopulm standpoint & she is instructed to continue her current meds regularly Symbicort & Incruse) plus her NEBS, VentHFA, & Lasix20 as needed;  Looks like she needs CDoppler f/u, f/u FLP, management of her thyroid medication to insure compliance w/ Rx, and management of her osteoporosis/ bone building therapy & her back pain due to compression fxs => note sent to her PCP for all these...  ~  April 10, 2016:  6wk ROV w/ SN>  Molleigh returns for an add-on visit after she received visit here in [place] from family in [place] including grandkids w/ URI;  She was coughing up green mucus, called Hospice & they did a portable at home CXR that is said to be OK, clear, and treated her w/ Prednisone, "It was just a virus" she says, now improved... She is actually doing very well w/o acute resp symptoms and she has remained on her Pred10, NEBS w/ Albut- 1/2 vial Tid, Symbicort160-2Bid, Incruse once daily... She is c/o her port O2 system-- has difficulty filling the tanks, too heavy for her, she's "scared of it"=> needs POC (wants simply-Go mini) & we will contact [facility] to change out her system... She is also due for her follow up CT Chest to compare to 02/2015...    Since she was here last she had a f/u visit w/ PCP- [name] at [facility]> f/u depression- improved on Remeron for sleep, LBP- hospice XRays reported neg but showed L2 & T12 compressions- given Medrol dosepak & Salonpas patches... He has since reviewed my last OV note w/ requests for [facility] f/u for her medical issues- hypothyroid w/oversuppressed TSH on Synthroid (we cut her down to 75mcg/d), osteoporosis & compression fxs, due for f/u CDopplers...  SEE PROB LIST ABOVE>>     EXAM shows Afeb, VSS, O2sat=95% on O2  at ?3L/min; Wt = 120#, BMI=22;  Heent- neg, mallampati1;  Chest- decr BS bilat w/o w/r/r;  Heart- RR gr1/6 SEM w/o r/g;  Abd soft, neg;  Ext- w/o c/c/e;  Neuro- weak, no focal deficits...  LABS 04/10/16 per Cory> TSH=0.67 on Synthroid75- continue same;  VitD=16 & needs supplement w/ 50K weekly... IMP/PLAN>>  COPD/ chr resp failure stable on current regimen- continue same;  Thyroid is improved on the 75mcg/d taken regularly- continue same;  She needs VitD and bone building therapy=> start VitD50K per week and ALENDRONATE 70mg one per week- must get these filled regularly & take them weekly going forward without fail...  ~  May 11, 2016:  1mo ROV & add-on appt requested for dyspnea> Shaena returns for an add-on visit "because I couldn't get thru to DrCory" c/o incr SOB, hard to cough   up any phlegm, denies f/c/s, c/o no appetite, shakey/ snappy/ agitated and incr SOB w/ activity & ADLs, feels like she can't get a deep breath, "anxious all the time"- says all this occurred when PRED weaned to 1/2 tab daily & she's been off x 2wks now; Hospice came out to her house & did a CXR- reported to be clear/ NAD (not avail for review, last CXR in Epic 01/10/16=> COPD, NAD, kyphosis & compression T12;...     EXAM shows Afeb, VSS, O2sat=96% on O2 at 3L/min; Wt = 124#, BMI=22;  HEENT- neg, mallampati1;  Chest- decr BS bilat w/o w/r/r;  Heart- RR gr1/6 SEM w/o r/g;  Abd soft, neg;  Ext- w/o c/c/e;  Neuro- weak, no focal deficits... IMP/PLAN>>  Eufelia has been using her NEBULIZER just prn & averaging once qod or so- we discussed a regular treatment regimen that includes using her NEB w/ 1/2 vial of Albut Tid regularly followed by her Symbicort160-2spBid & the Incruse once daily;  We will restart a systemic steroid med- try MEDROL 8mg tabs (one Bid for 10d then down to 1Qam til return);  In addition she will increase her Alprazolam to 1 tab Tid regularly (she's been on 1Bid but still anxious);  Keep other meds the same including  Alendronate70 once per week, Vit D 50K once per week, and Synthroid75 daily 1st thing in the Am;  We plan ROV recheck in 3wks time...  ~  June 05, 2016:  1mo ROV & add-on appt at request of Hospice nurse who said she had a "little rattle" in her chest- pt denies symptoms indicating chr stable SOB/DOE, no cough/ sput/ hemoptysis/ CP/ edema...  As noted Iyania has severe COPD (on Medrol8, NEBS w/ 1/2 vial Albut Tid followed by Symbicort160-2spBid & Incruse once daily); chr hypoxemic & hypercarbic resp fail (on O2 at 2.5L/min); Abn Chest CT w/ bilat pulm nodules; Ex-smoker (quit 04/2015); Hx pneumonia, Hx diastolic CHF w/ pulm edema & transudative L effusion 04/2015 (on Lasix20)... Her dyspnea is multifactorial w/ all these problems in addition to anxiety & poor medication compliance...    EXAM shows Afeb, VSS, O2sat=94% on O2 at 2L/min; Wt = 130# up 7#;  HEENT- neg, mallampati1;  Chest- decr BS bilat w/ few velcro rales at bases;  Heart- RR gr1/6 SEM w/o r/g;  Abd soft, neg;  Ext- w/o c/c/e;  Neuro- weak. IMP/PLAN>>  Ovida is concerned about the 7# wt gain on Medrol8 so we decided to decr this to 1/2 tab Qam; otherw continue same meds & we plan ROV in 3mo...  ~  June 19, 2016:  2wk ROV & add-on appt requested for head & chest congestion, cough w/ thick green mucus, and SOB- can't get a DB; pt denies hemoptysis/ f/c/s/ CP/ edema/ etc... As noted Gerilyn has severe COPD (on Medrol8-1/2 daily (but she stopped it on her own), NEBS w/ 1/2 vial Albut Tid followed by Symbicort160-2spBid & Incruse once daily); chr hypoxemic & hypercarbic resp fail (on O2 at 2.5L/min); Abn Chest CT w/ bilat pulm nodules; Ex-smoker (quit 04/2015); Hx pneumonia, Hx diastolic CHF w/ pulm edema & transudative L effusion 04/2015 (on Lasix20)... Her dyspnea is multifactorial w/ all these problems in addition to anxiety (on Xanax0.5mg but not taking regularly) & poor medication compliance (eg- still only using NEB bid)... NOTE> she called PCP  but didn't get response, Hospice stopped ~1wk ago; she wants POC...    EXAM shows Afeb, VSS, O2sat=93% on O2 at 2L/min; Wt = 131#;    HEENT- neg, mallampati1;  Chest- decr BS bilat w/ few velcro rales at bases;  Heart- RR gr1/6 SEM w/o r/g;  Abd soft, neg;  Ext- w/o c/c/e;  Neuro- weak.  CXR 06/19/16 (independently reviewed by me in the PACS system) showed COPD & incr markings both bases c/w Atx- chr changes include biapical pleural thickening, cardiomeg, Ao atherosclerosis, modHH, T12 compression fx...   LABS 06/19/16>  Chems- ok w/ HCO3=31 otherw wnl;  CBC- wnl w/ wbc=8.4;  Sed=49;  BNP=19... IMP/PLAN>>  Once again Laelynn is her own worse enemy by stopping the Medrol on her own & decreasing the Nebs to Bid; we spent extra time reviewing the rationale behind the Med Rx including Medrol (rec to incr to 74m/d again), NEBS w/ albut (1/2 vial TID) followed by the Symbicort160-2spBid & Incruse once daily; asked to incr the Mucinex12020mBid w/ fluids, and take the Alprazolam more regularly eg- 1/2 tab tid,  +continue her O2 etc... She requests a POC so she can incr her mobility... We plan ROV in 43m74mor recheck... Note: >50% of this 73m38misit was spent in counseling and coordination of care...  ~  August 14, 2016:  43mo 66mo& pulmonary follow up visit>  Evianna Lisetrts a good interval & indicates that her breathing is at baseline; once again however she has decreased the NEBs to just 1-2/d & she tells me that sometimes she skips a day (she believes that this means she is doing better); she is using the Symbicort & Incruse regularly; she remains on the Medrol8mg t67m taking 1/2 tab daily; she remains on O2 at 2L/min flow but still has the port tanks and [facility]- she tells me that she knows that insurance will cover this because she has Medicare, [employer]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 We reviewed the following medical problems during today's office visit >>     1) Severe COPD> she could not perform simple spirometry when attempted 04/2015; on MEDROL8-1/2Qam, NEBS w/ Albut Tid & using 1/2 vial 1-2x/d, SYMBICORT160-2spBid & Incruse once daily; Hx medication noncompliance & not using the NEBS Tid as requested!     2) Chronic hypoxemic and hypercarbic resp failure> O2sat was 74% on RA 10/16 [place]73[place]87 w/ resp acidosis; she is now on O2 at 2.5L/min regularly & stable, chest is clearer and labs improved; ambulatory oximetry 02/2016 showed no desaturation on 2.5L/min...     Marland KitchenMarland Kitchen Hx pneumonia> resolved- last CXR 06/2016 shows COPD & clear, no opacity...    4Marland KitchenMarland KitchenHx bilat pulm nodules on CT Chest> Her CXRs are much improved; her last CT Chest was 02/2015 & she never got the planned 03/2016 f/u CT scan...    5) Ex-smoker, quit 04/2015>  congrats on not smoking!    6) Diastolic CHF w/ pulm edema & transudative left pleural effusion 04/2015> on Lasix20 prn for edema...    7Marland KitchenMarland KitchenHx cerebrovasc dis & stroke> this must have occurred in [place] in EPIC & no prev CDopplers to review; rec to take ASA81/d & DrCory never ordered the CDopplers...    8) MEDICAL Issues>  Hyperlipidemia (not on meds),  Hypothyroidism (on Synthroid75/d),  Hx Prot-cal malnutrition (wt is up to 132# & BMI=23 now); Note>  I reviewed all her TSH readings and called her Pharm [facility] in [place]) & they confirmed poor med compliance in past- every time her dose was increased she was NOT taking her meds regularly!  on 145mg/d and pill counts are accurate- TSH=0.02 so this dose is still too high for her; WE DECR DOSE TO 770m/d and turn over follow up of this problem to her PCP- DrCory & DrBurchette...    9) Hx HH repair & esoph stricture> not on PPI rx at present    10) Hx colon polyps, divertics, hems (w/  banding), & cecal volvulus [facility]> aware...    11) S/P TAH & BSO> aware    12) DJD, Back pain w/ compression fxs> she indicates that she "cracked my spine" about 4y55yrgo, says she was hosp x4d w/ MRI, had brace applied- no shots or vertebroplasty ([facility] there are no [facility] reports in Epic from 2012-2016; she was taking Roxanol for pain but will be leaving hospice soon & her PCP will assume care of her osteoporosis, back pain=> pain meds and bone building therapy.    13) Osteoporosis> she had BMD 01/2015 by PCP w/ TScore -4.8 in Lspine & -3.7 in Left FemNeck; placed on Ca++, VitD OTC, and FOSAMAX70/wk started=> but compliance is poor...    14) Hx anxiety/ depression> on XANAX 0.5mg5m prn and REMERON 15mg2m prn...    15) Hx poor compliance w/ medical regimen> this was before Hospice involvement in her care (04/2015)    EXAM shows Afeb, VSS, O2sat=93% on O2 at 2L/min; Wt = 131#;  HEENT- neg, mallampati1;  Chest- decr BS bilat w/ few velcro rales at bases;  Heart- RR gr1/6 SEM w/o r/g;  Abd soft, neg;  Ext- w/o c/c/e;  Neuro- weak. IMP/PLAN>>  We spent some time again reviewing the need/ rationale behind the Tid NEBS w/ 1/2 vial of Albut followed by the Symbicort Bid & Spiriva once daily; she has requested that we refill her Xanax0.5mgBi91mSynthroid75/d, and Remeron30mgQh36me [facility]... we plan ROV in 3 months...   ~  November 13, 2016:  53mo ROV87mo/u of her COPD w/ chr hypoxemic & hypercarbic resp failure> Jyoti calTsionrecently w/ c/o URI, cough w/ yellow mucus & blood streaks, on her O2 at 2L/min 24/7, Medrol8mg- 1/243mily, NEBS w/ Albut using 1/2 vial but only prn & ave 1-2/wk, Symbicort160-2spBid, Incruse one daily;  We called in Levaquin [place] now...    As noted she's been off Hospice since 05/2016 & no longer taking Roxanol & Percocet; she uses Neurontin & Aleve for  pain;  Her PCP is "DrCorey" & last seen 07/2016...     Last labs 06/2016 reviewed in Epic> HCO3=31, Cr=0.57, BNP=19, CBC=wnl... EXAM shows Afeb, VSS, O2sat=96% on O2 at 2L/min; Wt = 130#;  HEENT- neg, mallampati1;  Chest- decr BS bilat w/ few velcro rales at bases;  Heart- RR gr1/6 SEM w/o r/g;  Abd soft, neg;  Ext- w/o c/c/e;  Neuro- weak. IMP/PLAN>>  We decided to slowly wean the Medrol8mg tabs=453mown to 1/2 tab Qod, continue other meds regularly as prescribed; stay as active as poss at home...     Past Medical History:  Diagnosis Date  . Arthritis   . Cerebrovascular disease   . Chronic diastolic CHF (congestive heart failure) (HCC) 8/9/2[place]  . COPD (chronic obstructive pulmonary disease) (HCC)   .[place]  Depression   . Diverticulosis   . Esophageal stricture   . History of pneumonia   . History of stroke    mild oncoming  . 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 Hyperlipidemia   . Hypothyroidism   . Internal hemorrhoids   . Osteoporosis   . Pneumonia    HX of  . Positive ANA (antinuclear antibody)   . Pulmonary nodule   . Rectal ulcer   . Shortness of breath    periodically with COPD flair up  . 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 BLADDER SURGERY     tack  . CATARACT EXTRACTION     bilateral  . COLONOSCOPY  06/22/2011   Procedure: COLONOSCOPY;  Surgeon: Lafayette Dragon, MD;  Location: [facility];  Service: Endoscopy;  Laterality: N/A;  . GASTRIC VARICES BANDING  06/22/2011   Procedure: HEMORRHOID BANDING;  Surgeon: Lafayette Dragon, MD;  Location: [facility] ENDOS[facility];  Service: Endoscopy;  Laterality: N/A;  . HEMICOLECTOMY  2012   emergent surgery  hospital  . HIATAL HERNIA REPAIR     x 2  . TONSILLECTOMY    . TOTAL ABDOMINAL HYSTERECTOMY W/ BILATERAL SALPINGOOPHORECTOMY  1998    Outpatient Encounter Prescriptions as of 11/13/2016  Medication Sig  . albuterol (PROVENTIL HFA;VENTOLIN HFA) 108 (90 BASE)  MCG/ACT inhaler Inhale 2 puffs into the lungs every 6 (six) hours as needed for wheezing or shortness of breath.  Marland Kitchen albuterol (PROVENTIL) (2.5 MG/3ML) 0.083% nebulizer solution Use 1/2 vial of ALBUTEROL in NEBULIZER three times daily...  . alendronate (FOSAMAX) 70 MG tablet Take 1 tablet (70 mg total) by mouth once a week. Take with a full glass of water on an empty stomach first thing in the morning.  . budesonide-formoterol (SYMBICORT) 160-4.5 MCG/ACT inhaler Inhale 2 puffs into the lungs 2 (two) times daily.  . furosemide (LASIX) 20 MG tablet Take 1 tablet (20 mg total) by mouth daily as needed for fluid or edema.  . gabapentin (NEURONTIN) 300 MG capsule TAKE ONE CAPSULE BY MOUTH TWICE DAILY  . halobetasol (ULTRAVATE) 0.05 % ointment Apply topically 2 (two) times daily.    Marland Kitchen levofloxacin (LEVAQUIN) 500 MG tablet Take 1 tablet (500 mg total) by mouth daily.  Marland Kitchen levothyroxine (SYNTHROID) 75 MCG tablet Take 1 tablet (75 mcg total) by mouth daily before breakfast.  . LORazepam (ATIVAN) 1 MG tablet Take 0.5-1 tablets (0.5-1 mg total) by mouth 3 (three) times daily as needed for anxiety.  . methylPREDNISolone (MEDROL) 8 MG tablet Take 1 daily x 14 days then 1/2 tablet daily thereafter (Patient taking differently: Take 1/2 tablet every other day)  . mirtazapine (REMERON) 30 MG tablet Take 1 tablet (30 mg total) by mouth at bedtime.  . simvastatin (ZOCOR) 10 MG tablet Take 1 tablet (10 mg total) by mouth at bedtime.  Marland Kitchen umeclidinium bromide (INCRUSE ELLIPTA) 62.5 MCG/INH AEPB Inhale 1 puff into the lungs daily.  . Vitamin D, Ergocalciferol, (DRISDOL) 50000 units CAPS capsule Take 1 capsule (50,000 Units total) by mouth every 7 (seven) days.  . [DISCONTINUED] ALPRAZolam (XANAX) 0.5 MG tablet Take 1 tablet (0.5 mg total) by mouth 2 (two) times daily as needed for anxiety. (Patient not taking: Reported on 11/13/2016)  . [DISCONTINUED] morphine (ROXANOL) 20 MG/ML concentrated solution Take 0.5 mLs (10 mg total)  by mouth every 2 (two) hours as needed for moderate pain, severe pain, anxiety or  shortness of breath. (Patient not taking: Reported on 11/13/2016)  . [DISCONTINUED] oxyCODONE-acetaminophen (PERCOCET) 5-325 MG tablet Take 1 to 2 tablets by mouth every 6 hours as need for pain (Patient not taking: Reported on 11/13/2016)   No facility-administered encounter medications on file as of 11/13/2016.     Allergies  Allergen Reactions  . Morphine And Related Nausea And Vomiting  . Penicillins Hives    Has patient had a PCN reaction causing immediate rash, facial/tongue/throat swelling, SOB or lightheadedness with hypotension: No Has patient had a PCN reaction causing severe rash involving mucus membranes or skin necrosis: No Has patient had a PCN reaction that required hospitalization No Has patient had a PCN reaction occurring within the last 10 years: No If all of the above answers are "NO", then may proceed with Cephalosporin use.    Immunization History  Administered Date(s) Administered  . H1N1 06/30/2008  . Influenza Split 04/18/2012  . Influenza Whole 06/10/2007, 04/14/2010, 04/18/2012  . Influenza, High Dose Seasonal PF 06/02/2015, 04/10/2016  . Influenza,inj,Quad PF,36+ Mos 04/16/2013  . Pneumococcal Conjugate-13 06/02/2015  . Pneumococcal Polysaccharide-23 05/17/2006, 04/16/2013  . Td 03/29/1999  . Tdap 04/16/2013  . Zoster 07/17/2013    Current Medications, Allergies, Past Medical History, Past Surgical History, Family History, and Social History were reviewed in [employer] record.   Review of Systems             All symptoms NEG except where BOLDED >>  Constitutional:  F/C/S, fatigue, anorexia, unexpected weight change. HEENT:  HA, visual changes, hearing loss, earache, nasal symptoms, sore throat, mouth sores, hoarseness. Resp:  cough, sputum, hemoptysis; SOB, tightness, wheezing. Cardio:  CP, palpit, DOE, orthopnea, edema. GI:  N/V/D/C, blood in  stool; reflux, abd pain, distention, gas. GU:  dysuria, freq, urgency, hematuria, flank pain, voiding difficulty. MS:  joint pain, swelling, tenderness, decr ROM; neck pain, back pain, etc. Neuro:  HA, tremors, seizures, dizziness, syncope, weakness, numbness, gait abn. Skin:  suspicious lesions or skin rash. Heme:  adenopathy, bruising, bleeding. Psyche:  confusion, agitation, sleep disturbance, hallucinations, anxiety, depression suicidal.   Objective:   Physical Exam       Vital Signs:  Reviewed...   General:  WD, thin, 76 y/o WM in NAD; alert & oriented; pleasant & cooperative... HEENT:  [place]/AT; Conjunctiva- pink, Sclera- nonicteric, EOM-wnl, PERRLA, EACs-clear, TMs-wnl; NOSE-clear; THROAT-clear & wnl.  Neck:  Supple w/ fair ROM; no JVD; normal carotid impulses w/o bruits; no thyromegaly or nodules palpated; no lymphadenopathy.  Chest:  Decr BS bilat, clear w/o w/r/r heard... Heart:  Regular Rhythm; ?S4, gr1/6 SEM Abdomen:  Soft & nontender- no guarding or rebound; normal bowel sounds; no organomegaly or masses palpated. Ext:  decr ROM; without deformities +arthritic changes; no varicose veins, venous insuffic, no edema;  Pulses intact w/o bruits. Neuro:  No focal neuro deficits- pt in wheelchair & won't stand or walk due to LBP... Derm:  No lesions noted; no rash etc. Lymph:  No cervical, supraclavicular, axillary, or inguinal adenopathy palpated.   Assessment:      IMP>>     Severe COPD> she could not perform simple spirometry when attempted 04/2015; on NEBS w/ Albut Q6H prn but it makes her too jittery, SYMBICORT160-2spBid & not using the [facility] due to $$;  REC to use Albut 1/2vial in NEB Tid regularly followed by Symbicort160-2spBid & try INCRUSE once daily => stay on these regularly...    Chronic hypoxemic and hypercarbic resp failure> O2sat was 74% on  RA 10/16 and ABGs in [place] showed pCO2=73-87 w/ resp acidosis; she is now on O2 at 2.5L/min regularly & looks much better, chest  is clearer and labs improved;  Ambulatory oximetry 02/2016 w/ sats in the 90s on 1.5L after 3 Laps- much improved...    Hx pneumonia> resolved- CXR 12/2015 shows COPD & is clear, no opacity...    Hx bilat pulm nodules on CT Chest> Her CXR is much improved 01/10/16; she is due for f/u CT Chest but can't get this til she is OFF Hospice!    Ex-smoker, quit 04/2015>  congrats on not smoking!    Diastolic CHF w/ pulm edema & transudative left pleural effusion 04/2015> on Lasix20 prn only    Hx cerebrovasc dis & stroke> this must have occurred in [place]- no data in EPIC & no prev CDopplers to review; rec to take ASA81/d & we will check CDopplers later...    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    S/P TAH & BSO> aware    DJD, Back pain w/ compression fxs> she indicates that she "cracked my spine" about 424yrago, says she was hosp x4d w/ MRI, had brace applied- no shots or vertebroplasty ([facility] there are no XRay reports in Epic from 2012-2016;      Osteoporosis> she had BMD 01/2015 by PCP w/ TScore -4.8 in Lspine & -3.7 in Left FemNeck; placed on Ca++, VitD OTC, and FOSAMAX70/wk started; she picked up #4 tabs 02/11/15 but never refilled the med; WE STARTED VITD 50K per week & ALENDRONATE 7037mk ([id number]   Hx anxiety/ depression> on XANAX 0.5mg7m prn and REMERON 15mg19m prn...    Hx poor compliance w/ medical regimen> this was before Hospice involvement in her care (04/2015)  PLAN>>  01/10/16>   Respiratory-wise she is improved, CXR is improved, etc but she needs to continue taking O2 and meds regularly- O2 at 2L/min by [place] 24/7,  NEBS w/ Albut using 1/2 vial TID every day followed by SYMBICORT160- 2spBid and new INCRUSE one inhalation daily;  XRay of Lspine shows new compression in L2, and Epic review showed that she only took Alendronate70 for 4 wks in Aug2016- she never refilled the Rx;  Discussed CXR & Lumbar films w/ Sister-in-law care giver Nancy=> REC that Tayen re-start Calcium/ VitD/ ALENDRONATE70/wk & stay on this (she never did); she has a back brace at home & pain meds from Hospice, needs PT for ambulation;  We decreased her Synthroid to 112mcg14ms well... ROV planned in 6wks time. 02/23/16>   Jya hShatasiatabilized from the cardiopulm standpoint & she is instructed to continue her current meds regularly (Symbicort & Incruse) plus her NEBS, VentHFA, & Lasix20 as needed;  Looks like she needs CDoppler f/u, f/u FLP, management of her thyroid medication to insure compliance w/ Rx (we cut her to 75mcg 80my), and management of her osteoporosis/ bone building therapy & her back pain due to compression fxs => note sent to her PCP... 04/10/16>   Gearl neMariannyto continue her O2, Pred10, NEBS, Symbicort, Incruse regularly;  In addition she needs  to stay on the Synthroid48mg/d dose regularly;  And finally needs VitD50K & Alendronate70 both weekly for her bones... 05/11/16>   JShamonahas been using her NEBULIZER just prn & averaging once qod or so- we discussed a regular treatment regimen that includes using her NEB w/ 1/2 vial of Albut Tid regularly followed by her Symbicort160-2spBid & the Incruse once daily;  We will restart a systemic steroid med- try MEDROL 836mtabs (one Bid for 10d then down to 1Qam til return);  In addition she will increase her Alprazolam to 1 tab Tid regularly (she's been on 1Bid but still anxious);  Keep other meds the same including Alendronate70 once per week, Vit D 50K once per week, and Synthroid75 daily 1st thing in the Am;  We plan ROV recheck in 3wks time... 06/05/16>   Add on appt per Hospice nurse who heard  rales at bases, pt is not acutely symptomatic- but is concerned about 7# wt gain on Medrol8 so we decided to decr this to 1/2 tab Qam; otherw continue same meds & we plan ROV in 45m57mo 06/19/16>   Once again JudSelinda her own worse enemy by stopping the Medrol on her own & decreasing the Nebs to Bid; we spent extra time reviewing the rationale behind the Med Rx including Medrol (rec to incr to 8mg45magain), NEBS w/ albut (1/2 vial TID) followed by the Symbicort160-2spBid & Incruse once daily; asked to incr the Mucinex1200mg68m w/ fluids, and incr the Alprazolam 1/2 tab tid, +continue her O2 etc... She requests a POC so she can incr her mobility... 08/14/16>   We spent some time again reviewing the need/ rationale behind the Tid NEBS w/ 1/2 vial of Albut followed by the Symbicort Bid & Spiriva once daily; she has requested that we refill her Xanax0.5mgBi10mSynthroid75/d, and Remeron30mgQh345me [facility] pt's use... we plan ROV in 3 months...  11/13/16>    We decided to slowly wean the Medrol8mg tab14m down to 1/2 tab Qod, continue other meds regularly as prescribed; stay as active as poss at home...    Plan:     Patient's Medications  New Prescriptions   No medications on file  Previous Medications   ALBUTEROL (PROVENTIL HFA;VENTOLIN HFA) 108 (90 BASE) MCG/ACT INHALER    Inhale 2 puffs into the lungs every 6 (six) hours as needed for wheezing or shortness of breath.   ALBUTEROL (PROVENTIL) (2.5 MG/3ML) 0.083% NEBULIZER SOLUTION    Use 1/2 vial of ALBUTEROL in NEBULIZER three times daily...   ALENDRONATE (FOSAMAX) 70 MG TABLET    Take 1 tablet (70 mg total) by mouth once a week. Take with a full glass of water on an empty stomach first thing in the morning.   BUDESONIDE-FORMOTEROL (SYMBICORT) 160-4.5 MCG/ACT INHALER    Inhale 2 puffs into the lungs 2 (two) times daily.   FUROSEMIDE (LASIX) 20 MG TABLET    Take 1 tablet (20 mg total) by mouth daily as needed for fluid or  edema.   GABAPENTIN (NEURONTIN) 300 MG CAPSULE    TAKE ONE CAPSULE BY MOUTH TWICE DAILY   HALOBETASOL (ULTRAVATE) 0.05 % OINTMENT    Apply topically 2 (two) times daily.     LEVOFLOXACIN (LEVAQUIN) 500 MG TABLET    Take 1 tablet (500 mg total) by mouth daily.   LEVOTHYROXINE (SYNTHROID) 75 MCG TABLET    Take 1 tablet (75 mcg total) by mouth daily before breakfast.   LORAZEPAM (ATIVAN)  1 MG TABLET    Take 0.5-1 tablets (0.5-1 mg total) by mouth 3 (three) times daily as needed for anxiety.   METHYLPREDNISOLONE (MEDROL) 8 MG TABLET    Take 1 daily x 14 days then 1/2 tablet daily thereafter   MIRTAZAPINE (REMERON) 30 MG TABLET    Take 1 tablet (30 mg total) by mouth at bedtime.   SIMVASTATIN (ZOCOR) 10 MG TABLET    Take 1 tablet (10 mg total) by mouth at bedtime.   UMECLIDINIUM BROMIDE (INCRUSE ELLIPTA) 62.5 MCG/INH AEPB    Inhale 1 puff into the lungs daily.   VITAMIN D, ERGOCALCIFEROL, (DRISDOL) 50000 UNITS CAPS CAPSULE    Take 1 capsule (50,000 Units total) by mouth every 7 (seven) days.  Modified Medications   No medications on file  Discontinued Medications   ALPRAZOLAM (XANAX) 0.5 MG TABLET    Take 1 tablet (0.5 mg total) by mouth 2 (two) times daily as needed for anxiety.   MORPHINE (ROXANOL) 20 MG/ML CONCENTRATED SOLUTION    Take 0.5 mLs (10 mg total) by mouth every 2 (two) hours as needed for moderate pain, severe pain, anxiety or shortness of breath.   OXYCODONE-ACETAMINOPHEN (PERCOCET) 5-325 MG TABLET    Take 1 to 2 tablets by mouth every 6 hours as need for pain

## 2016-11-13 NOTE — Patient Instructions (Signed)
Today we updated your med list in our EPIC system...    Continue your current medications the same...  We reviewed your Oxygen therapy, your NEBULIZED albuterol, and the SYMBICORT/ INCRUSE medications...    Call for any questions...  Stay as active as possible & BE CAREFUL- no falling allowed!  Let's plan a follow up visit in 3-29mo, sooner if needed for problems.Marland KitchenMarland Kitchen

## 2016-12-04 ENCOUNTER — Other Ambulatory Visit: Payer: Self-pay | Admitting: Pulmonary Disease

## 2016-12-11 ENCOUNTER — Other Ambulatory Visit: Payer: Self-pay | Admitting: Adult Health

## 2017-02-02 ENCOUNTER — Other Ambulatory Visit: Payer: Self-pay | Admitting: Pulmonary Disease

## 2017-02-22 ENCOUNTER — Telehealth: Payer: Self-pay | Admitting: Adult Health

## 2017-02-22 ENCOUNTER — Telehealth: Payer: Self-pay | Admitting: Pulmonary Disease

## 2017-02-22 ENCOUNTER — Encounter: Payer: Self-pay | Admitting: Adult Health

## 2017-02-22 ENCOUNTER — Ambulatory Visit (INDEPENDENT_AMBULATORY_CARE_PROVIDER_SITE_OTHER): Payer: Medicare Other | Admitting: Adult Health

## 2017-02-22 ENCOUNTER — Ambulatory Visit (INDEPENDENT_AMBULATORY_CARE_PROVIDER_SITE_OTHER)
Admission: RE | Admit: 2017-02-22 | Discharge: 2017-02-22 | Disposition: A | Discharge status: Home / Self Care | Payer: Medicare Other | Source: Ambulatory Visit | Attending: Adult Health | Admitting: Adult Health

## 2017-02-22 VITALS — BP 122/74 | HR 87 | Temp 97.9°F

## 2017-02-22 DIAGNOSIS — S2222XA Fracture of body of sternum, initial encounter for closed fracture: Secondary | ICD-10-CM

## 2017-02-22 DIAGNOSIS — R079 Chest pain, unspecified: Secondary | ICD-10-CM | POA: Diagnosis not present

## 2017-02-22 DIAGNOSIS — R0782 Intercostal pain: Secondary | ICD-10-CM | POA: Diagnosis not present

## 2017-02-22 DIAGNOSIS — S299XXA Unspecified injury of thorax, initial encounter: Secondary | ICD-10-CM | POA: Diagnosis not present

## 2017-02-22 MED ORDER — TRAMADOL HCL 50 MG PO TABS: 25.0000 mg | ORAL_TABLET | Freq: Four times a day (QID) | ORAL | 0 refills | Status: DC | PRN

## 2017-02-22 NOTE — Telephone Encounter (Signed)
Called and spoke with the patient, states that she fell and hit her chest on her dog's kennel - hit the center of her chest on the corner of the cage. Pt states she landed on the floor. Pt seen by Primary Care at [facility] and chest xray done, showed displaced sternal fracture. Pt states that she is having pain from her waist up and her ribs are hurting all around her chest. Pt states that her left breast if very blue.  Denies any coughing, wheezing or increased SOB.   Needs an OV ASAP to evaluate treatment of displaced fracture.   Pt states that she is having trouble breathing due the pain.   Pt taking Advil for pain.   Advised that I will talk with Dr Lenna Gilford for recommendations and advised that she call Dorothyann Peng for pain control meds in the meantime.   Pt requests that we contact Doylene Canning [facility]) for scheduling appts.

## 2017-02-22 NOTE — Telephone Encounter (Signed)
Pt calling to get xray results and to see if she can get something called in for pain.

## 2017-02-22 NOTE — Progress Notes (Signed)
Subjective:    Patient ID: Heidi [name], female    DOB: Mar 27, 1941, 76 y.o.   MRN: [id number]  HPI  76 year old female who  has a past medical history of Arthritis; Cerebrovascular disease; Chronic diastolic CHF (congestive heart failure) ([place]) (02/23/2016); COPD (chronic obstructive pulmonary disease) ([place]); Depression; Diverticulosis; Esophageal stricture; History of pneumonia; History of stroke; adenomatous colonic polyps; Hyperlipidemia; Hypothyroidism; Internal hemorrhoids; Osteoporosis; Pneumonia; Positive ANA (antinuclear antibody); Pulmonary nodule; Rectal ulcer; Shortness of breath; and 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([place]).  She presents to the office s/p mechanical fall early this morning. She reports that she got up this morning to let her dog out. She lost her balance and fell into the dog crete. She tried to brace herself but ended up hitting her sternum on the dog crete. She reports bruising on her chest and pain that is worse with with inspiration.   She denies hitting her head or any LOC    Review of Systems See HPI Past Medical History:  Diagnosis Date  . Arthritis   . Cerebrovascular disease   . Chronic diastolic CHF (congestive heart failure) ([place]) 02/23/2016  . COPD (chronic obstructive pulmonary disease) ([place])   . Depression   . Diverticulosis   . Esophageal stricture   . History of pneumonia   . History of stroke    mild oncoming  . 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 Hyperlipidemia   . Hypothyroidism   . Internal hemorrhoids   . Osteoporosis   . Pneumonia    HX of  . Positive ANA (antinuclear antibody)   . Pulmonary nodule   . Rectal ulcer   . Shortness of breath    periodically with COPD flair up  . 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[facility])     Social History   Social History  . Marital status: Widowed    Spouse name: N/A  . Number of children: 3  . Years of education: N/A   Occupational History  . RETIRED Retired   Social History Main Topics    . Smoking status: Former Smoker    Packs/day: 2.50    Years: 50.00    Quit date: 11/01/2009  . Smokeless tobacco: Never Used  . Alcohol use No  . Drug use: No  . Sexual activity: Not Currently   Other Topics Concern  . Not on file   Social History Narrative   Patient has no recent travel. She is smoking 2 packs per day and has smoked since age of 20. Has cats and dogs at home. No bird or mold exposure.       dtr with temporary     Past Surgical History:  Procedure Laterality Date  . BLADDER SURGERY     tack  . CATARACT EXTRACTION     bilateral  . COLONOSCOPY  06/22/2011   Procedure: COLONOSCOPY;  Surgeon: Lafayette Dragon, MD;  Location: [facility] ENDOSCOPY;  Service: Endoscopy;  Laterality: N/A;  . GASTRIC VARICES BANDING  06/22/2011   Procedure: HEMORRHOID BANDING;  Surgeon: Lafayette Dragon, MD;  Location: [facility] ENDOSCOPY;  Service: Endoscopy;  Laterality: N/A;  . HEMICOLECTOMY  2012   emergent surgery [facility]  . HIATAL HERNIA REPAIR     x 2  . TONSILLECTOMY    . TOTAL ABDOMINAL HYSTERECTOMY W/ BILATERAL SALPINGOOPHORECTOMY  1998    Family History  Problem Relation Age of Onset  . Colon cancer Mother   . COPD Father   . Heart  attack Father   . Stroke Maternal Aunt   . Colon polyps Brother   . Liver disease Maternal Aunt   . Breast cancer Maternal Aunt   . Hypertension Son   . Hypothyroidism Daughter     Allergies  Allergen Reactions  . Morphine And Related Nausea And Vomiting  . Penicillins Hives    Has patient had a PCN reaction causing immediate rash, facial/tongue/throat swelling, SOB or lightheadedness with hypotension: No Has patient had a PCN reaction causing severe rash involving mucus membranes or skin necrosis: No Has patient had a PCN reaction that required hospitalization No Has patient had a PCN reaction occurring within the last 10 years: No If all of the above answers are "NO", then may proceed with Cephalosporin use.    Current Outpatient  Prescriptions on File Prior to Visit  Medication Sig Dispense Refill  . albuterol (PROVENTIL HFA;VENTOLIN HFA) 108 (90 BASE) MCG/ACT inhaler Inhale 2 puffs into the lungs every 6 (six) hours as needed for wheezing or shortness of breath. 1 Inhaler 0  . albuterol (PROVENTIL) (2.5 MG/3ML) 0.083% nebulizer solution Use 1/2 vial of ALBUTEROL in NEBULIZER three times daily... 150 mL 0  . alendronate (FOSAMAX) 70 MG tablet TAKE ONE TABLET BY MOUTH ONCE A WEEK. TAKE WITH A FULL GLASS OF WATER ON AN EMPTY STOMACH FIRST THING IN THE MORNING. 12 tablet 3  . budesonide-formoterol (SYMBICORT) 160-4.5 MCG/ACT inhaler Inhale 2 puffs into the lungs 2 (two) times daily. 1 Inhaler 6  . furosemide (LASIX) 20 MG tablet Take 1 tablet (20 mg total) by mouth daily as needed for fluid or edema. 30 tablet 0  . gabapentin (NEURONTIN) 300 MG capsule TAKE 1 CAPSULE BY MOUTH TWICE DAILY 60 capsule 2  . halobetasol (ULTRAVATE) 0.05 % ointment Apply topically 2 (two) times daily.      Marland Kitchen levothyroxine (SYNTHROID) 75 MCG tablet Take 1 tablet (75 mcg total) by mouth daily before breakfast. 30 tablet 5  . LORazepam (ATIVAN) 1 MG tablet TAKE 1/2 TO 1 (ONE-HALF TO ONE) TABLET BY MOUTH THREE TIMES DAILY AS NEEDED **DISCONTINUE XANAX** 90 tablet 2  . methylPREDNISolone (MEDROL) 8 MG tablet Take 1 daily x 14 days then 1/2 tablet daily thereafter (Patient taking differently: Take 1/2 tablet every other day) 30 tablet 6  . mirtazapine (REMERON) 30 MG tablet Take 1 tablet (30 mg total) by mouth at bedtime. 30 tablet 5  . simvastatin (ZOCOR) 10 MG tablet Take 1 tablet (10 mg total) by mouth at bedtime. 90 tablet 3  . Vitamin D, Ergocalciferol, (DRISDOL) 50000 units CAPS capsule Take 1 capsule (50,000 Units total) by mouth every 7 (seven) days. 12 capsule 3   No current facility-administered medications on file prior to visit.     BP 122/74   Pulse 87   Temp 97.9 F (36.6 C) (Oral)   SpO2 93%       Objective:   Physical Exam    Constitutional: She is oriented to person, place, and time. She appears well-developed and well-nourished. No distress.  Cardiovascular: Normal rate, regular rhythm, normal heart sounds and intact distal pulses.  Exam reveals no gallop and no friction rub.   No murmur heard. Pulmonary/Chest: Effort normal and breath sounds normal. No respiratory distress. She has no wheezes. She has no rales. She exhibits no tenderness.  Musculoskeletal: She exhibits tenderness. She exhibits no edema.  Tenderness across sternum. She has a small bruise on right upper breast. No abrasions noted.   Neurological: She is  alert and oriented to person, place, and time.  Skin: Skin is warm and dry. No rash noted. She is not diaphoretic. No erythema. No pallor.  Psychiatric: She has a normal mood and affect. Her behavior is normal. Judgment and thought content normal.  Nursing note and vitals reviewed.     Assessment & Plan:  1. Intercostal pain - Likely bruise but will r/o fracture.  - ok to take Motrin 400-600 mg and ice to area for 15 minutes at a time  - Advised she will be sore for the next few days  - DG Chest 2 View; Future  Dorothyann Peng, NP

## 2017-02-22 NOTE — Telephone Encounter (Signed)
Cory, did you speak to pt?

## 2017-02-22 NOTE — Telephone Encounter (Signed)
Spoke to [place] ( [facility]) and informed her of results of X ray - patient has a displaced sternal fracture. Patient continued to be in pain and has been taking Aleve without relief.   Dr. Daneil Dolin office has been contacted and will try and set up an appointment tomorrow for her.   In the meantime, I am going to place an order for stat CT and refer to ortho for further evaluation.   I will send in order for tramadol 25 mg for pain control

## 2017-02-22 NOTE — Telephone Encounter (Signed)
Spoke with [name] as Dr Lenna Gilford is already gone for the day, We will speak with Dr Lenna Gilford in the AM about seeing this patient or for recommendations. Pt is not in any distress with breathing and was advised to seek care at ED if breathing issues begin tonight. Pt advised in the meantime to contact Primary Care to request pain meds for pain control as the Advil is not helping.    Pt requests that I call her sister and discuss this with her.  Spoke with Patient's Sister, [name], aware that we are going to talk to Dr Lenna Gilford in the morning 02/23/17 and will call her as soon as we speak to him. Gave same recommendations to go to ED if breathing issues begin.   Will send to Dr Lenna Gilford to advise.

## 2017-02-23 ENCOUNTER — Telehealth: Payer: Self-pay | Admitting: Adult Health

## 2017-02-23 ENCOUNTER — Telehealth: Payer: Self-pay | Admitting: Pulmonary Disease

## 2017-02-23 DIAGNOSIS — S2222XA Fracture of body of sternum, initial encounter for closed fracture: Secondary | ICD-10-CM

## 2017-02-23 NOTE — Telephone Encounter (Signed)
Patient is scheduled with Dr. Lenna Gilford on 02/28/2017 at 10:00 am.

## 2017-02-23 NOTE — Telephone Encounter (Signed)
Nothing further is needed. 

## 2017-02-23 NOTE — Telephone Encounter (Signed)
Called and lmomtcb x 1 for Heidi [name] to call back

## 2017-02-23 NOTE — Telephone Encounter (Signed)
I have called and lmomtcb x 2 for Heidi [name]---the earliest appt we can get her in would be Wednesday.

## 2017-02-23 NOTE — Telephone Encounter (Addendum)
Rodena Piety from [facility] called because this pt's CT scan that  [facility], [place] ordered is showing up in their work queue. Spoke with Hilda Blades who verified it was in their queue and that in order for her to schedule it, it will need to be re-ordered. This was done.  Tommi Rumps, PA-C this is a Pharmacist, hospital

## 2017-02-23 NOTE — Telephone Encounter (Signed)
[name] returned call - she can be reached at [phone] -pr

## 2017-02-26 ENCOUNTER — Ambulatory Visit (INDEPENDENT_AMBULATORY_CARE_PROVIDER_SITE_OTHER)
Admission: RE | Admit: 2017-02-26 | Discharge: 2017-02-26 | Disposition: A | Discharge status: Home / Self Care | Payer: Medicare Other | Source: Ambulatory Visit | Attending: Adult Health | Admitting: Adult Health

## 2017-02-26 DIAGNOSIS — S2222XA Fracture of body of sternum, initial encounter for closed fracture: Secondary | ICD-10-CM | POA: Diagnosis not present

## 2017-02-26 DIAGNOSIS — S2220XA Unspecified fracture of sternum, initial encounter for closed fracture: Secondary | ICD-10-CM | POA: Diagnosis not present

## 2017-02-28 ENCOUNTER — Encounter: Payer: Self-pay | Admitting: Pulmonary Disease

## 2017-02-28 ENCOUNTER — Ambulatory Visit (INDEPENDENT_AMBULATORY_CARE_PROVIDER_SITE_OTHER): Payer: Medicare Other | Admitting: Pulmonary Disease

## 2017-02-28 VITALS — BP 114/70 | HR 90 | Temp 97.5°F | Ht 64.5 in | Wt 127.5 lb

## 2017-02-28 DIAGNOSIS — I5032 Chronic diastolic (congestive) heart failure: Secondary | ICD-10-CM | POA: Diagnosis not present

## 2017-02-28 DIAGNOSIS — F411 Generalized anxiety disorder: Secondary | ICD-10-CM

## 2017-02-28 DIAGNOSIS — J9611 Chronic respiratory failure with hypoxia: Secondary | ICD-10-CM | POA: Diagnosis not present

## 2017-02-28 DIAGNOSIS — J449 Chronic obstructive pulmonary disease, unspecified: Secondary | ICD-10-CM

## 2017-02-28 DIAGNOSIS — I679 Cerebrovascular disease, unspecified: Secondary | ICD-10-CM

## 2017-02-28 DIAGNOSIS — W19XXXA Unspecified fall, initial encounter: Secondary | ICD-10-CM

## 2017-02-28 NOTE — Progress Notes (Signed)
Subjective:     Patient ID: Heidi [name], female   DOB: [birth year], 76 y.o.   MRN: [id number]  HPI 76 y/o WF former smoker who quit 2016 w/ severe COPD/emphysema & chronic hypoxemic and hypercarbic resp failure, placed on Hospice by Heidi [name]/ Heidi [name] in 2016, hx pneumonia and small bilat pulm nodules on CT Chest, and diastolic CHF...   ~  May 13, 2015:  Initial pulmonary consult by SN>        Heidi [name] was Heidi [name] 10/3 - 04/27/15 by [facility] after presenting via EMS w/ cough, weakness, increased SOB; she was still smoking, not on home O2; found to have COPD exac & CAP w/ bibasilar opacities, acute on chronic hypoxemic & hypercarbic resp failure, & superimposed CHF w/ bilat L>R pleural effusions (this was tapped 10/9 yielding transudative fluid, neg cytology, neg culture)... She was seen by Heidi [name] attended her in the hosp; Treated w/ Rocephin/ Zithromax, Solumedrol, Nebs, & brief stay in step-down unit on BiPap; disch on ADVAIR100-1spBid, SPIRIVA daily, PRED taper, NEBS w/ Albut... She was also treated for acute on chronic diastolic CHF- mod dil RV & reduced RV sys function (w/ preserved LVF) on 2DEcho & diuresed w/ Lasix but only disch on 38m prn for edema... Hospice was consulted (Heidi [name]'s note reviewed) and she has entered the Hospice program (she is a DNR, DNI) w/ a prognosis est <681mo she was disch on ROXANOL 2012ml--take 1/2 ml Q2H as needed for pain, SOB, anxiety...  She last saw Heidi [name] 11/2013, she has established new PCP- Heidi PengDrBurchette- has post hosp f/u pending; Note> she cancelled 2 appts w/ Heidi [name] recently...       Currently Heidi [name] is very stoic- states she's not having any breathing problems at the present time & credits the thoracentesis w/ her remarkable improvement; her last cigarette was 04/19/15 prior to her admission; she was disch on O2 at 4L/min & has an O2sat=89% on this in the office today (RA sat was 74%); she was unable to perform PFT today; we had  a frank discussion about her severe, end-stage COPD...       Epic records indicate that she was seen by Heidi [name] in 2011> on Spiriva +/- Advair, stated she quit smoking 2011 on adm for COPD exac, PFT reported FEV1=1.32 (77%), FEV1/FVC ratio=50%, DLCO=70%; CXR showed COPD & biapical pleuroparenchymal scarring, CT Chest 4/11 showed ?mult pulm nodules ?early cavitation r/o MAI, cultures- only NTF, O2sat=95% on RA...  Medical Hx>   She had an annual wellness exam from her PCP 02/2015> COPD, Hx pulm nodule, Hx of pneumonia; Hypothyroidism; adenomatous colonic polyps; Hyperlipidemia; Sigmoid volvulus; Depression; Cerebrovascular disease; Positive ANA; Diverticulosis; Esophageal stricture; Arthritis; History of stroke; Internal hemorrhoids; and Rectal ulcer; protein-calorie malnutrition (she weighs 78 lbs...  EXAM shows Afeb, VSS, O2sat=89% on O2 at 4L/min; Wt=78#, 5'2"Tall, BMI=16;  Heent- neg, mallampati1;  Chest- decr BS bilat, scat rhonchi, & end-exp wheezing;  Heart- RR gr1/6 SEM w/o r/g;  Abd soft, neg;  Ext- w/o c/c/ tr edema...   Baseline CXR 12/31/14 showed hyperinflation c/w COPD, biapical pleuroparenchymal scarring, nipple shadows and T12 compression fx...  CT Chest 02/22/15 showed cardiomeg, atherosclerotic changes in Ao & coronaries, small right effusion; emhysema w/ several additional findings- RUL & lingular subpleural opacities, & 5mm14mL & RLL nodules; osteopenia and T7 +T12 compression fractures...   CXRs performed during the 04/2015 Admission showed cardiomeg, CHF w/ left effusion, COPD w/ interstitial edema & LLL opac => improved post thoracentesis  10/9 w/ 400cc removed...  ABGs during the 10/16 Hosp showed pCO2 in the 73-87 range w/ resp acidosis...   LABS 10/16 reviewed> CBC- ok w/ Hg=13 range;  Chems- note HCO3=44-45 range & she may benefit drom Acetazolamide added to her diuretic regimen if more aggressive treatment is desired...  IMP>> Heidi [name] has severe COPD w/ acute on chronic hypoxemic &  hypercarbic resp failure & cor pulmonale; her last cigarette was the day of admission 04/19/15... She also has a hx of poor compliance w/ med rx & she has once again stopped the prescribed meds perceiving that she is much better since the thoracentesis; she was referred to Hospice for outpt follow up & will continue to see her PCPs at [facility]; I told her I would be happy to recheck at any time if she so desired...  NOTE> since her disch she states that her breathing has been "so much better" that she hasn't needed her inhalers or the nebulizer, and she's off the Pred; she states she is taking the ASA81, Lasix20, Synthroid137, Xanax, and Remeron; she apparently hasn't needed the Roxanol yet...  We had a frank discussion about her end-stage disease & we "proved it to her" by checking her O2sat off the oxygen (74%), and attempting to get a Spirometry tracing (she couldn't do it);  Pt is asked to get back on her meds & we outlined a regimen>  NEBS w/ Albut Qid (breakfast, lunch, dinner, & bedtime);  ADVAIR100- 2spBid (taken after the Neb at breakfast & dinner);  SPIRIVA via handihaler once daily (taken after the Neb at lunch)...   ~  January 10, 2016:  8mo ROV w/ SN>  NOTE: family has moved her into her own apt 12/2015) next to her Sister-in-law/ care giver's apt      Heidi [name] returns for a follow up visit- she has severe COPD/emphysema w/ chronic hypoxemic and hypercarbic resp failure; she was placed on [facility] by Heidi [name] in Oct2016; she has been followed by NP-Heidi [name] since then;  She returns today w/ her sister-in-law care giver Heidi [name] #[phone] indicating that Hospice is planning to stop services soon but pt is c/o severe LBP x 6wks ever since she lifted a chlorox bottle & felt a "pop" in her back; she has mentioned this to Hospice but all they have done is to adjust her pain meds (Percocet5-325 & Roxanol 20mg/ml- 0.5ml Q2H prn) without further eval;  She states that her breathing is  OK- meaning she could get about in her apt before the episode of acute LBP 2wks ago;I note that she has not had any lab work or f/u CXR since her disch from the Hosp 04/2015 & she is clearly in need of same- she has a hx of compression fx of spine and osteoporosis but not on any bone building therapy... We reviewed the following medical problems during today's office visit >>     Severe COPD> she could not perform simple spirometry when attempted 04/2015; on NEBS w/ Albut Q6H prn but it makes her too jittery, SYMBICORT160-2spBid & not using the SPIRIVA due to $$;  REC to use Albut 1/2vial in NEB Tid regularly followed by Symbicort160-2spBid & try INCRUSE once daily => we plan ROV in 6wks w/ repeat spirometry & oximetry if she can walk...     Chronic hypoxemic and hypercarbic resp failure> O2sat was 74% on RA 10/16 and ABGs in Hosp showed pCO2=73-87 w/ resp acidosis; she is now on O2 at 2.5L/min regularly &   looks much better, chest is clearer and labs improved; we will check ambulatory oximetry on ret if able to walk for [place]...    Hx pneumonia> resolved- CXR 12/2015 shows COPD & is clear, no opacity...    Hx bilat pulm nodules on CT Chest> Her CXR is much improved 01/10/16; she will be due for a f/u CT Chest on return 02/2016...    Ex-smoker, quit 04/2015>  congrats on not smoking!    Diastolic CHF w/ pulm edema & transudative left pleural effusion 04/2015> on Lasix20 prn only    Hx cerebrovasc dis & stroke> this must have occurred in [place]- no data in EPIC & no prev CDopplers to review; rec to take ASA81/d & we will check CDopplers later...    MEDICAL Issues>  Hyperlipidemia (not on meds),  Hypothyroidism (on Synthroid137/d),  Prot-cal malnutrition (wt is up 38# & BMI=21 now),      Hx HH repair & esoph stricture> not on PPI rx at present    Hx colon polyps, divertics, hems (w/ banding), & cecal volvulus s/p right hemicolectomy at [facility] 2012> aware...    S/P TAH & BSO> aware    DJD, Back pain w/  compression fxs> she indicates that she "cracked my spine" about 67yr ago, says she was hosp x4d w/ MRI, had brace applied- no shots or vertebroplasty ([facility] are [facility] in [facility] from [facility]-2016;      Osteoporosis> she had BMD 01/2015 by PCP w/ TScore -4.8 in Lspine & -3.7 in Left FemNeck; placed on Ca++, VitD OTC, and FOSAMAX70/wk started; she picked up #4 tabs 02/11/15 but never refilled the med...    Hx anxiety/ depression> on XANAX 0.571mid prn and REMERON 1559mhs prn...    Hx poor compliance w/ medical regimen> this was before Hospice involvement in her care (04/2015) EXAM shows Afeb, VSS, O2sat=97% on O2 at 2.5L/min; Wt was 116# in [id number] 38# to BMI=21);  Heent- neg, mallampati1;  Chest- decr BS bilat w/o w/r/r;  Heart- RR gr1/6 SEM w/o r/g;  Abd soft, neg;  Ext- w/o c/c/e in wheelchair & won't stand/walk due to LBP...  CXR 01/10/16>  Much improved- some hyperinflation, bilat apical pleural thickening & bibasilar scarring, no effusion/ edema/ consolidation/ adenopathy, stable ant wedging of T12 w/ kyphosis...   Lumbar spine XRay 01/10/16>  Anterior wedging of L2 is new, old fx of T12, no other fxs & no spondylolithesis- disc sp narrowing L2-3 & L3-4, facet arthritis L5-S1, calcif in abd Ao...  LABS 12/2015>  Chems- wnl, HCO3=32, renal=wnl;  CBC- wnl;  TSH=0.01 on synthroid137;  BNP=70;  Sede=3 IMP/PLAN>>  Respiratory-wise she is improved, CXR is improved, etc but she needs to continue taking O2 and meds regularly- O2 at 2L/min by [place] 24/7, NEBS w/ Albut using 1/2 vial TID every day followed by SYMBICORT160- 2spBid and new INCRUSE one inhalation daily;  XRay of Lspine shows new compression in L2, and Epic review showed that she only took Alendronate70 for 4 wks in Aug2016- she never refilled the Rx;  Discussed CXR & Lumbar films w/ Sister-in-law care giver Heidi [name]=> REC that Leanza re-start Calcium/ VitD/ ALENDRONATE70/wk & stay on this; she has a back  brace at home & pain meds from Hospice, needs PT for ambulation;  Second issue is her oversuppressed TSH on Synth137=> REC decr dose to 112 & recheck TSH on return... ROV planned in 6wks time.  ~  February 23, 2016:  6wk ROV & f/u w/ SN>  Estie has done remarkably well from the pulm standpoint- stabilized and improved suffic to be able to move into her own appt & care for herself w/ family support nearby; she says she feels like she's getting better too; she was working w/ PT at home but she thinks they were working her too hard & she sent them away- I explained that this therapy is the MOST IMPORTANT thing for her continued recovery!  She tells me that this is the last month of her yr on Hospice, she has Roxanol on her med list but thank goodness she hasn't needed it!  She is in a wheelchair in the office today- but tells me she is up & about at home=> we will walk her today & check O2sats...    1) Severe COPD> she could not perform simple spirometry when attempted 04/2015; on NEBS w/ Albut Q6H prn & using 1/2 vial 2-3x per wk, SYMBICORT160-2spBid & Incruse once daily; Hx medication noncompliance & not using the NEBS Tid as requested!     2) Chronic hypoxemic and hypercarbic resp failure> O2sat was 74% on RA 10/16 and ABGs in Hosp showed pCO2=73-87 w/ resp acidosis; she is now on O2 at 2.5L/min regularly & looks much better, chest is clearer and labs improved; ambulatory oximetry 02/2016 showed no desaturation on 2.5L/min...     3) Hx pneumonia> resolved- CXR 12/2015 shows COPD & clear, no opacity...    4) Hx bilat pulm nodules on CT Chest> Her CXR is much improved 01/10/16; she will be due for a f/u CT Chest to check these nodules soon...    5) Ex-smoker, quit 04/2015>  congrats on not smoking!    6) Diastolic CHF w/ pulm edema & transudative left pleural effusion 04/2015> on Lasix20 prn only now    7) Hx cerebrovasc dis & stroke> this must have occurred in [place]- no data in EPIC & no prev CDopplers to  review; rec to take ASA81/d & she will need CDopplers from her PCP on ret...    8) MEDICAL Issues>  Hyperlipidemia (not on meds),  Hypothyroidism (on Synthroid112/d),  Prot-cal malnutrition (wt is up to 114# & BMI=20 now); I reviewed all her TSH readings and called her Pharm ([facility] in [place]) & they confirmed poor med compliance in past- eery time her dose was increased she was NOT taking her meds regularly!  She is now on 112mcg/d and pill counts are accurate- TSH=0.02 so this dose is still too high for her; WE WILL DECR DOSE TO 75mcg/d and turn over follow up of this problem to her PCP- DrCory & Heidi [name]...    9) Hx HH repair & esoph stricture> not on PPI rx at present    10) Hx colon polyps, divertics, hems (w/ banding), & cecal volvulus s/p right hemicolectomy at [place] 2012> aware...    11) S/P TAH & BSO> aware    12) DJD, Back pain w/ compression fxs> she indicates that she "cracked my spine" about 4yrs ago, says she was hosp x4d w/ MRI, had brace applied- no shots or vertebroplasty (this must have been done at [facility] as there are no XRay reports in Epic from 2012-2016; she was taking Roxanol for pain but will be leaving hospice soon & her PCP will assume care of her osteoporosis, back pain=> pain meds and bone building therapy.    13) Osteoporosis> she had BMD 01/2015 by PCP w/ TScore -4.8 in Lspine & -3.7 in   Left FemNeck; placed on Ca++, VitD OTC, and FOSAMAX70/wk started; she picked up #4 tabs 02/11/15 but never refilled the med...    14) Hx anxiety/ depression> on XANAX 0.5mgTid prn and REMERON 15mg Qhs prn...    15) Hx poor compliance w/ medical regimen> this was before Hospice involvement in her care (04/2015) EXAM shows Afeb, VSS, O2sat=97% on O2 at 1.5L/min; Wt = 114#, BMI=21;  Heent- neg, mallampati1;  Chest- decr BS bilat w/o w/r/r;  Heart- RR gr1/6 SEM w/o r/g;  Abd soft, neg;  Ext- w/o c/c/e in wheelchair today...  Ambulatory oximetry 02/23/16>  O2sat=94% on 1.5L/min at rest  w/ pulse=82/min;  She ambulated 3 Laps pushing her wheelchair w/ lowest O2sat=91% w/ HR=110/min...  LABS 02/23/16 showed TSH=0.02 on Levothy 112mcg/d taken regularly (confirmed by [facility])... IMP/PLAN>>  Monicia has stabilized from the cardiopulm standpoint & she is instructed to continue her current meds regularly Symbicort & Incruse) plus her NEBS, VentHFA, & Lasix20 as needed;  Looks like she needs CDoppler f/u, f/u FLP, management of her thyroid medication to insure compliance w/ Rx, and management of her osteoporosis/ bone building therapy & her back pain due to compression fxs => note sent to her PCP for all these...  ~  April 10, 2016:  6wk ROV w/ SN>  Chaquana returns for an add-on visit after she received visit here in [place] from family in [place] including grandkids w/ URI;  She was coughing up green mucus, called Hospice & they did a portable at home CXR that is said to be OK, clear, and treated her w/ Prednisone, "It was just a virus" she says, now improved... She is actually doing very well w/o acute resp symptoms and she has remained on her Pred10, NEBS w/ Albut- 1/2 vial Tid, Symbicort160-2Bid, Incruse once daily... She is c/o her port O2 system-- has difficulty filling the tanks, too heavy for her, she's "scared of it"=> needs POC (wants simply-Go mini) & we will contact [facility] to change out her system... She is also due for her follow up CT Chest to compare to 02/2015...    Since she was here last she had a f/u visit w/ PCP- [name] at [facility]> f/u depression- improved on Remeron for sleep, LBP- hospice XRays reported neg but showed L2 & T12 compressions- given Medrol dosepak & Salonpas patches... He has since reviewed my last OV note w/ requests for [facility] f/u for her medical issues- hypothyroid w/oversuppressed TSH on Synthroid (we cut her down to 75mcg/d), osteoporosis & compression fxs, due for f/u CDopplers...  SEE PROB LIST ABOVE>>     EXAM shows Afeb, VSS, O2sat=95% on O2  at ?3L/min; Wt = 120#, BMI=22;  Heent- neg, mallampati1;  Chest- decr BS bilat w/o w/r/r;  Heart- RR gr1/6 SEM w/o r/g;  Abd soft, neg;  Ext- w/o c/c/e;  Neuro- weak, no focal deficits...  LABS 04/10/16 per Cory> TSH=0.67 on Synthroid75- continue same;  VitD=16 & needs supplement w/ 50K weekly... IMP/PLAN>>  COPD/ chr resp failure stable on current regimen- continue same;  Thyroid is improved on the 75mcg/d taken regularly- continue same;  She needs VitD and bone building therapy=> start VitD50K per week and ALENDRONATE 70mg one per week- must get these filled regularly & take them weekly going forward without fail...  ~  May 11, 2016:  1mo ROV & add-on appt requested for dyspnea> Fartun returns for an add-on visit "because I couldn't get thru to DrCory" c/o incr SOB, hard to cough   up any phlegm, denies f/c/s, c/o no appetite, shakey/ snappy/ agitated and incr SOB w/ activity & ADLs, feels like she can't get a deep breath, "anxious all the time"- says all this occurred when PRED weaned to 1/2 tab daily & she's been off x 2wks now; Hospice came out to her house & did a CXR- reported to be clear/ NAD (not avail for review, last CXR in Epic 01/10/16=> COPD, NAD, kyphosis & compression T12;...     EXAM shows Afeb, VSS, O2sat=96% on O2 at 3L/min; Wt = 124#, BMI=22;  HEENT- neg, mallampati1;  Chest- decr BS bilat w/o w/r/r;  Heart- RR gr1/6 SEM w/o r/g;  Abd soft, neg;  Ext- w/o c/c/e;  Neuro- weak, no focal deficits... IMP/PLAN>>  Tanicia has been using her NEBULIZER just prn & averaging once qod or so- we discussed a regular treatment regimen that includes using her NEB w/ 1/2 vial of Albut Tid regularly followed by her Symbicort160-2spBid & the Incruse once daily;  We will restart a systemic steroid med- try MEDROL 8mg tabs (one Bid for 10d then down to 1Qam til return);  In addition she will increase her Alprazolam to 1 tab Tid regularly (she's been on 1Bid but still anxious);  Keep other meds the same including  Alendronate70 once per week, Vit D 50K once per week, and Synthroid75 daily 1st thing in the Am;  We plan ROV recheck in 3wks time...  ~  June 05, 2016:  1mo ROV & add-on appt at request of Hospice nurse who said she had a "little rattle" in her chest- pt denies symptoms indicating chr stable SOB/DOE, no cough/ sput/ hemoptysis/ CP/ edema...  As noted Lylith has severe COPD (on Medrol8, NEBS w/ 1/2 vial Albut Tid followed by Symbicort160-2spBid & Incruse once daily); chr hypoxemic & hypercarbic resp fail (on O2 at 2.5L/min); Abn Chest CT w/ bilat pulm nodules; Ex-smoker (quit 04/2015); Hx pneumonia, Hx diastolic CHF w/ pulm edema & transudative L effusion 04/2015 (on Lasix20)... Her dyspnea is multifactorial w/ all these problems in addition to anxiety & poor medication compliance...    EXAM shows Afeb, VSS, O2sat=94% on O2 at 2L/min; Wt = 130# up 7#;  HEENT- neg, mallampati1;  Chest- decr BS bilat w/ few velcro rales at bases;  Heart- RR gr1/6 SEM w/o r/g;  Abd soft, neg;  Ext- w/o c/c/e;  Neuro- weak. IMP/PLAN>>  Katherleen is concerned about the 7# wt gain on Medrol8 so we decided to decr this to 1/2 tab Qam; otherw continue same meds & we plan ROV in 3mo...  ~  June 19, 2016:  2wk ROV & add-on appt requested for head & chest congestion, cough w/ thick green mucus, and SOB- can't get a DB; pt denies hemoptysis/ f/c/s/ CP/ edema/ etc... As noted Shevaun has severe COPD (on Medrol8-1/2 daily (but she stopped it on her own), NEBS w/ 1/2 vial Albut Tid followed by Symbicort160-2spBid & Incruse once daily); chr hypoxemic & hypercarbic resp fail (on O2 at 2.5L/min); Abn Chest CT w/ bilat pulm nodules; Ex-smoker (quit 04/2015); Hx pneumonia, Hx diastolic CHF w/ pulm edema & transudative L effusion 04/2015 (on Lasix20)... Her dyspnea is multifactorial w/ all these problems in addition to anxiety (on Xanax0.5mg but not taking regularly) & poor medication compliance (eg- still only using NEB bid)... NOTE> she called PCP  but didn't get response, Hospice stopped ~1wk ago; she wants POC...    EXAM shows Afeb, VSS, O2sat=93% on O2 at 2L/min; Wt = 131#;    HEENT- neg, mallampati1;  Chest- decr BS bilat w/ few velcro rales at bases;  Heart- RR gr1/6 SEM w/o r/g;  Abd soft, neg;  Ext- w/o c/c/e;  Neuro- weak.  CXR 06/19/16 (independently reviewed by me in the PACS system) showed COPD & incr markings both bases c/w Atx- chr changes include biapical pleural thickening, cardiomeg, Ao atherosclerosis, modHH, T12 compression fx...   LABS 06/19/16>  Chems- ok w/ HCO3=31 otherw wnl;  CBC- wnl w/ wbc=8.4;  Sed=49;  BNP=19... IMP/PLAN>>  Once again Laelynn is her own worse enemy by stopping the Medrol on her own & decreasing the Nebs to Bid; we spent extra time reviewing the rationale behind the Med Rx including Medrol (rec to incr to 74m/d again), NEBS w/ albut (1/2 vial TID) followed by the Symbicort160-2spBid & Incruse once daily; asked to incr the Mucinex12020mBid w/ fluids, and take the Alprazolam more regularly eg- 1/2 tab tid,  +continue her O2 etc... She requests a POC so she can incr her mobility... We plan ROV in 43m74mor recheck... Note: >50% of this 73m38misit was spent in counseling and coordination of care...  ~  August 14, 2016:  43mo 66mo& pulmonary follow up visit>  Ellee Lisetrts a good interval & indicates that her breathing is at baseline; once again however she has decreased the NEBs to just 1-2/d & she tells me that sometimes she skips a day (she believes that this means she is doing better); she is using the Symbicort & Incruse regularly; she remains on the Medrol8mg t67m taking 1/2 tab daily; she remains on O2 at 2L/min flow but still has the port tanks and [facility]- she tells me that she knows that insurance will cover this because she has Medicare, [employer]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 We reviewed the following medical problems during today's office visit >>     1) Severe COPD> she could not perform simple spirometry when attempted 04/2015; on MEDROL8-1/2Qam, NEBS w/ Albut Tid & using 1/2 vial 1-2x/d, SYMBICORT160-2spBid & Incruse once daily; Hx medication noncompliance & not using the NEBS Tid as requested!     2) Chronic hypoxemic and hypercarbic resp failure> O2sat was 74% on RA 10/16 [place] [facility]73[place]87 w/ resp acidosis; she is now on O2 at 2.5L/min regularly & stable, chest is clearer and labs improved; ambulatory oximetry 02/2016 showed no desaturation on 2.5L/min...     Marland KitchenMarland Kitchen Hx pneumonia> resolved- last CXR 06/2016 shows COPD & clear, no opacity...    4Marland KitchenMarland KitchenHx bilat pulm nodules on CT Chest> Her CXRs are much improved; her last CT Chest was 02/2015 & she never got the planned 03/2016 f/u CT scan...    5) Ex-smoker, quit 04/2015>  congrats on not smoking!    6) Diastolic CHF w/ pulm edema & transudative left pleural effusion 04/2015> on Lasix20 prn for edema...    7Marland KitchenMarland KitchenHx cerebrovasc dis & stroke> this must have occurred in [place] in EPIC & no prev CDopplers to review; rec to take ASA81/d & DrCory never ordered the CDopplers...    8) MEDICAL Issues>  Hyperlipidemia (not on meds),  Hypothyroidism (on Synthroid75/d),  Hx Prot-cal malnutrition (wt is up to 132# & BMI=23 now); Note>  I reviewed all her TSH readings and called her Pharm [facility] in [place]) & they confirmed poor med compliance in past- every time her dose was increased she was NOT taking her meds regularly!  on 159mg/d and pill counts are accurate- TSH=0.02 so this dose is still too high for her; WE DECR DOSE TO 759m/d and turn over follow up of this problem to her PCP- DrCory & Heidi [name]...    9) Hx HH repair & esoph stricture> not on PPI rx at present    10) Hx colon polyps, divertics, hems (w/  banding), & cecal volvulus [facility]> aware...    11) S/P TAH & BSO> aware    12) DJD, Back pain w/ compression fxs> she indicates that she "cracked my spine" about 4y59yrgo, says she was hosp x4d w/ MRI, had brace applied- no shots or vertebroplasty ([facility] there are no X[facility] reports in Epic from 2012-2016; she was taking Roxanol for pain but will be leaving hospice soon & her PCP will assume care of her osteoporosis, back pain=> pain meds and bone building therapy.    13) Osteoporosis> she had BMD 01/2015 by PCP w/ TScore -4.8 in Lspine & -3.7 in Left FemNeck; placed on Ca++, VitD OTC, and FOSAMAX70/wk started=> but compliance is poor...    14) Hx anxiety/ depression> on XANAX 0.5mg82m prn and REMERON 15mg37m prn...    15) Hx poor compliance w/ medical regimen> this was before Hospice involvement in her care (04/2015)    EXAM shows Afeb, VSS, O2sat=93% on O2 at 2L/min; Wt = 131#;  HEENT- neg, mallampati1;  Chest- decr BS bilat w/ few velcro rales at bases;  Heart- RR gr1/6 SEM w/o r/g;  Abd soft, neg;  Ext- w/o c/c/e;  Neuro- weak. IMP/PLAN>>  We spent some time again reviewing the need/ rationale behind the Tid NEBS w/ 1/2 vial of Albut followed by the Symbicort Bid & Spiriva once daily; she has requested that we refill her Xanax0.5mgBi51mSynthroid75/d, and Remeron30mgQh37me [facility]... we plan ROV in 3 months...  ~  November 13, 2016:  76mo ROV69mo/u of her COPD w/ chr hypoxemic & hypercarbic resp failure> Daizy calDaneillerecently w/ c/o URI, cough w/ yellow mucus & blood streaks, on her O2 at 2L/min 24/7, Medrol8mg- 1/237mily, NEBS w/ Albut using 1/2 vial but only prn & ave 1-2/wk, Symbicort160-2spBid, Incruse one daily;  We called in Levaquin Ravenad now...    As noted she's been off Hospice since 05/2016 & no longer taking Roxanol & Percocet; she uses Neurontin & Aleve for pain;   Her PCP is "DrCorey" & last seen 07/2016...     Last labs 06/2016 reviewed in Epic> HCO3=31, Cr=0.57, BNP=19, CBC=wnl... EXAM shows Afeb, VSS, O2sat=96% on O2 at 2L/min; Wt = 130#;  HEENT- neg, mallampati1;  Chest- decr BS bilat w/ few velcro rales at bases;  Heart- RR gr1/6 SEM w/o r/g;  Abd soft, neg;  Ext- w/o c/c/e;  Neuro- weak. IMP/PLAN>>  We decided to slowly wean the Medrol8mg tabs=27mown to 1/2 tab Qod, continue other meds regularly as prescribed; stay as active as poss at home...   ~  February 28, 2017:  76mo ROV & 576moon appt requested after a fall at home>  Pt reports fall at home 02/22/17- she lost her balance letting her dog (Lulu) out & fell onto  the dog crete, hit her ant chest wall w/ bruise & pain, didn't hit head/ no LOC;  She went to see DrCorey- note reviewed, tender over sternum, CXR w/ concern for sternal fx, and subseq CT Chest 02/26/17=> atherosclerotic calcif in Ao & coronaries, no aneurysm, no adenopathy, mod HH, scarring in lung apicies & centrilob emphysema, mils atx at bases, fx of mid sternum w/ 34mm post displacement of the distal fx fragment, mod ant wedging of L2 & L7... EXAM shows Afeb, VSS, O2sat=90% on O2 at 2L/min; Wt = 128#;  HEENT- neg, mallampati1;  Chest- tender over sternum, bruising, decr BS bilat w/ few velcro rales at bases;  Heart- RR gr1/6 SEM w/o r/g;  Abd soft, neg;  Ext- w/o c/c/e;  Neuro- weak. IMP/PLAN>>  We discussed rx w/ rest, heat, TRAMADOL50 + Tylenol500, exercise extreme caution-- no falling allowed!    Past Medical History:  Diagnosis Date  . Arthritis   . Cerebrovascular disease   . Chronic diastolic CHF (congestive heart failure) (HCC) 02/23/2016  . COPD (chronic obstructive pulmonary disease) (HCC)   . Depression   . Diverticulosis   . Esophageal stricture   . History of pneumonia   . History of stroke    mild oncoming  . 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 Hyperlipidemia   . Hypothyroidism   . Internal  hemorrhoids   . Osteoporosis   . Pneumonia    HX of  . Positive ANA (antinuclear antibody)   . Pulmonary nodule   . Rectal ulcer   . Shortness of breath    periodically with COPD flair up  . 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[name])     Past Surgical History:  Procedure Laterality Date  . BLADDER SURGERY     tack  . CATARACT EXTRACTION     bilateral  . COLONOSCOPY  06/22/2011   Procedure: COLONOSCOPY;  Surgeon: Hart Carwin, MD;  Location: [facility];  Service: Endoscopy;  Laterality: N/A;  . GASTRIC VARICES BANDING  06/22/2011   Procedure: HEMORRHOID BANDING;  Surgeon: Hart Carwin, MD;  Location: [facility];  Service: Endoscopy;  Laterality: N/A;  . HEMICOLECTOMY  2012   emergent surgery [name]  . HIATAL HERNIA REPAIR     x 2  . TONSILLECTOMY    . TOTAL ABDOMINAL HYSTERECTOMY W/ BILATERAL SALPINGOOPHORECTOMY  1998    Outpatient Encounter Prescriptions as of 02/28/2017  Medication Sig  . albuterol (PROVENTIL HFA;VENTOLIN HFA) 108 (90 BASE) MCG/ACT inhaler Inhale 2 puffs into the lungs every 6 (six) hours as needed for wheezing or shortness of breath.  Marland Kitchen albuterol (PROVENTIL) (2.5 MG/3ML) 0.083% nebulizer solution Use 1/2 vial of ALBUTEROL in NEBULIZER three times daily...  . alendronate (FOSAMAX) 70 MG tablet TAKE ONE TABLET BY MOUTH ONCE A WEEK. TAKE WITH A FULL GLASS OF WATER ON AN EMPTY STOMACH FIRST THING IN THE MORNING.  . budesonide-formoterol (SYMBICORT) 160-4.5 MCG/ACT inhaler Inhale 2 puffs into the lungs 2 (two) times daily.  . furosemide (LASIX) 20 MG tablet Take 1 tablet (20 mg total) by mouth daily as needed for fluid or edema.  . gabapentin (NEURONTIN) 300 MG capsule TAKE 1 CAPSULE BY MOUTH TWICE DAILY  . halobetasol (ULTRAVATE) 0.05 % ointment Apply topically 2 (two) times daily.    Marland Kitchen levothyroxine (SYNTHROID) 75 MCG tablet Take 1 tablet (75 mcg total) by mouth daily before breakfast.  . LORazepam (ATIVAN) 1 MG tablet TAKE 1/2 TO 1 (ONE-HALF TO ONE) TABLET  BY MOUTH  THREE TIMES DAILY AS NEEDED **DISCONTINUE XANAX**  . Melatonin 5 MG CAPS Take by mouth.  . methylPREDNISolone (MEDROL) 8 MG tablet Take 1 daily x 14 days then 1/2 tablet daily thereafter (Patient taking differently: Take 1/2 tablet every other day)  . mirtazapine (REMERON) 30 MG tablet Take 1 tablet (30 mg total) by mouth at bedtime.  . simvastatin (ZOCOR) 10 MG tablet Take 1 tablet (10 mg total) by mouth at bedtime.  . traMADol (ULTRAM) 50 MG tablet Take 0.5 tablets (25 mg total) by mouth every 6 (six) hours as needed.  . Vitamin D, Ergocalciferol, (DRISDOL) 50000 units CAPS capsule Take 1 capsule (50,000 Units total) by mouth every 7 (seven) days.   No facility-administered encounter medications on file as of 02/28/2017.     Allergies  Allergen Reactions  . Morphine And Related Nausea And Vomiting  . Penicillins Hives    Has patient had a PCN reaction causing immediate rash, facial/tongue/throat swelling, SOB or lightheadedness with hypotension: No Has patient had a PCN reaction causing severe rash involving mucus membranes or skin necrosis: No Has patient had a PCN reaction that required hospitalization No Has patient had a PCN reaction occurring within the last 10 years: No If all of the above answers are "NO", then may proceed with Cephalosporin use.    Immunization History  Administered Date(s) Administered  . H1N1 06/30/2008  . Influenza Split 04/18/2012  . Influenza Whole 06/10/2007, 04/14/2010, 04/18/2012  . Influenza, High Dose Seasonal PF 06/02/2015, 04/10/2016  . Influenza,inj,Quad PF,36+ Mos 04/16/2013  . Pneumococcal Conjugate-13 06/02/2015  . Pneumococcal Polysaccharide-23 05/17/2006, 04/16/2013  . Td 03/29/1999  . Tdap 04/16/2013  . Zoster 07/17/2013    Current Medications, Allergies, Past Medical History, Past Surgical History, Family History, and Social History were reviewed in [employer] record.   Review of Systems             All  symptoms NEG except where BOLDED >>  Constitutional:  F/C/S, fatigue, anorexia, unexpected weight change. HEENT:  HA, visual changes, hearing loss, earache, nasal symptoms, sore throat, mouth sores, hoarseness. Resp:  cough, sputum, hemoptysis; SOB, tightness, wheezing. Cardio:  CP, palpit, DOE, orthopnea, edema. GI:  N/V/D/C, blood in stool; reflux, abd pain, distention, gas. GU:  dysuria, freq, urgency, hematuria, flank pain, voiding difficulty. MS:  joint pain, swelling, tenderness, decr ROM; neck pain, back pain, etc. Neuro:  HA, tremors, seizures, dizziness, syncope, weakness, numbness, gait abn. Skin:  suspicious lesions or skin rash. Heme:  adenopathy, bruising, bleeding. Psyche:  confusion, agitation, sleep disturbance, hallucinations, anxiety, depression suicidal.   Objective:   Physical Exam       Vital Signs:  Reviewed...   General:  WD, thin, 76 y/o WM in NAD; alert & oriented; pleasant & cooperative... HEENT:  [place]/AT; Conjunctiva- pink, Sclera- nonicteric, EOM-wnl, PERRLA, EACs-clear, TMs-wnl; NOSE-clear; THROAT-clear & wnl.  Neck:  Supple w/ fair ROM; no JVD; normal carotid impulses w/o bruits; no thyromegaly or nodules palpated; no lymphadenopathy.  Chest:  Decr BS bilat, clear w/o w/r/r heard... Heart:  Regular Rhythm; ?S4, gr1/6 SEM Abdomen:  Soft & nontender- no guarding or rebound; normal bowel sounds; no organomegaly or masses palpated. Ext:  decr ROM; without deformities +arthritic changes; no varicose veins, venous insuffic, no edema;  Pulses intact w/o bruits. Neuro:  No focal neuro deficits- pt in wheelchair & won't stand or walk due to LBP... Derm:  No lesions noted; no rash etc. Lymph:  No cervical, supraclavicular,  axillary, or inguinal adenopathy palpated.   Assessment:      IMP>>     Severe COPD> she could not perform simple spirometry when attempted 04/2015; on NEBS w/ Albut Q6H prn but it makes her too jittery, SYMBICORT160-2spBid & not using the  [facility] due to $$;  REC to use Albut 1/2vial in NEB Tid regularly followed by Symbicort160-2spBid & try INCRUSE once daily => stay on these regularly...    Chronic hypoxemic and hypercarbic resp failure> O2sat was 74% on RA 10/16 and ABGs in [place] showed pCO2=73-87 w/ resp acidosis; she is now on O2 at 2.5L/min regularly & looks much better, chest is clearer and labs improved;  Ambulatory oximetry 02/2016 w/ sats in the 90s on 1.5L after 3 Laps- much improved...    Hx pneumonia> resolved- CXR 12/2015 shows COPD & is clear, no opacity...    Hx bilat pulm nodules on CT Chest> Her CXR is much improved 01/10/16; she is due for f/u CT Chest but can't get this til she is OFF Hospice!    Ex-smoker, quit 04/2015>  congrats on not smoking!    Diastolic CHF w/ pulm edema & transudative left pleural effusion 04/2015> on Lasix20 prn only    Hx cerebrovasc dis & stroke> this must have occurred in [place]- no data in EPIC & no prev CDopplers to review; rec to take ASA81/d & we will check CDopplers later...    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    S/P TAH & BSO> aware    DJD, Back pain w/ compression fxs> she indicates that she "cracked my spine" about 446yrago, says she was hosp x4d w/ MRI, had brace applied- no shots or vertebroplasty ([facility] there are no XRay reports in Epic from 2012-2016;      Osteoporosis> she had BMD 01/2015 by PCP w/ TScore -4.8 in Lspine & -3.7 in Left FemNeck; placed on Ca++, VitD OTC, and FOSAMAX70/wk started; she picked up #4 tabs 02/11/15 but never refilled the med; WE STARTED VITD 50K per week & ALENDRONATE  '70mg'$ /wk (Sep2017).    Hx anxiety/ depression> on XANAX 0.'5mg'$ Tid prn and REMERON '15mg'$  Qhs prn...    Hx poor compliance w/ medical regimen> this was before Hospice involvement in her care (04/2015)  PLAN>>  01/10/16>   Respiratory-wise she is improved, CXR is improved, etc but she needs to continue taking O2 and meds regularly- O2 at 2L/min by [place] 24/7, NEBS w/ Albut using 1/2 vial TID every day followed by SYMBICORT160- 2spBid and new INCRUSE one inhalation daily;  XRay of Lspine shows new compression in L2, and Epic review showed that she only took Alendronate70 for 4 wks in Aug2016- she never refilled the Rx;  Discussed CXR & Lumbar films w/ Sister-in-law care giver Heidi [name]=> REC that [place] re-start Calcium/ VitD/ ALENDRONATE70/wk & stay on this (she never did); she has a back brace at home & pain meds from Hospice, needs PT for ambulation;  We decreased her Synthroid to 11282md as well... ROV planned in 6wks time. 02/23/16>   JudIreannas stabilized from the cardiopulm standpoint &  she is instructed to continue her current meds regularly (Symbicort & Incruse) plus her NEBS, VentHFA, & Lasix20 as needed;  Looks like she needs CDoppler f/u, f/u FLP, management of her thyroid medication to insure compliance w/ Rx (we cut her to 72mg today), and management of her osteoporosis/ bone building therapy & her back pain due to compression fxs => note sent to her PCP... 04/10/16>   JEshikaneeds to continue her O2, Pred10, NEBS, Symbicort, Incruse regularly;  In addition she needs to stay on the Synthroid765m/d dose regularly;  And finally needs VitD50K & Alendronate70 both weekly for her bones... 05/11/16>   JuLennixas been using her NEBULIZER just prn & averaging once qod or so- we discussed a regular treatment regimen that includes using her NEB w/ 1/2 vial of Albut Tid regularly followed by her Symbicort160-2spBid & the Incruse once daily;  We will restart a systemic steroid med- try MEDROL '8mg'$  tabs (one Bid for 10d then down  to 1Qam til return);  In addition she will increase her Alprazolam to 1 tab Tid regularly (she's been on 1Bid but still anxious);  Keep other meds the same including Alendronate70 once per week, Vit D 50K once per week, and Synthroid75 daily 1st thing in the Am;  We plan ROV recheck in 3wks time... 06/05/16>   Add on appt per Hospice nurse who heard rales at bases, pt is not acutely symptomatic- but is concerned about 7# wt gain on Medrol8 so we decided to decr this to 1/2 tab Qam; otherw continue same meds & we plan ROV in 55m40mo 06/19/16>   Once again JudLandis her own worse enemy by stopping the Medrol on her own & decreasing the Nebs to Bid; we spent extra time reviewing the rationale behind the Med Rx including Medrol (rec to incr to '8mg'$ /d again), NEBS w/ albut (1/2 vial TID) followed by the Symbicort160-2spBid & Incruse once daily; asked to incr the Mucinex'1200mg'$  Bid w/ fluids, and incr the Alprazolam 1/2 tab tid, +continue her O2 etc... She requests a POC so she can incr her mobility... 08/14/16>   We spent some time again reviewing the need/ rationale behind the Tid NEBS w/ 1/2 vial of Albut followed by the Symbicort Bid & Spiriva once daily; she has requested that we refill her Xanax0.'5mg'$ Bid, Synthroid75/d, and Remeron'30mg'$ Qhs; [facility]... we plan ROV in 3 months... 11/13/16>    We decided to slowly wean the Medrol'8mg'$  tabs=> down to 1/2 tab Qod, continue other meds regularly as prescribed; stay as active as poss at home...  02/28/17>   We discussed rx w/ rest, heat, TRAMADOL50 + Tylenol500, exercise extreme caution-- no falling allowed!   Plan:     Patient's Medications  New Prescriptions   No medications on file  Previous Medications   ALBUTEROL (PROVENTIL HFA;VENTOLIN HFA) 108 (90 BASE) MCG/ACT INHALER    Inhale 2 puffs into the lungs every 6 (six) hours as needed for wheezing or shortness of breath.   ALBUTEROL (PROVENTIL) (2.5 MG/3ML) 0.083%  NEBULIZER SOLUTION    Use 1/2 vial of ALBUTEROL in NEBULIZER three times daily...   ALENDRONATE (FOSAMAX) 70 MG TABLET    TAKE ONE TABLET BY MOUTH ONCE A WEEK. TAKE WITH A FULL GLASS OF WATER ON AN EMPTY STOMACH FIRST THING IN THE MORNING.   BUDESONIDE-FORMOTEROL (SYMBICORT) 160-4.5 MCG/ACT INHALER    Inhale 2 puffs into the lungs 2 (two) times daily.   FUROSEMIDE (LASIX)  20 MG TABLET    Take 1 tablet (20 mg total) by mouth daily as needed for fluid or edema.   GABAPENTIN (NEURONTIN) 300 MG CAPSULE    TAKE 1 CAPSULE BY MOUTH TWICE DAILY   HALOBETASOL (ULTRAVATE) 0.05 % OINTMENT    Apply topically 2 (two) times daily.     LEVOTHYROXINE (SYNTHROID) 75 MCG TABLET    Take 1 tablet (75 mcg total) by mouth daily before breakfast.   LORAZEPAM (ATIVAN) 1 MG TABLET    TAKE 1/2 TO 1 (ONE-HALF TO ONE) TABLET BY MOUTH THREE TIMES DAILY AS NEEDED **DISCONTINUE XANAX**   MELATONIN 5 MG CAPS    Take by mouth.   METHYLPREDNISOLONE (MEDROL) 8 MG TABLET    Take 1 daily x 14 days then 1/2 tablet daily thereafter   MIRTAZAPINE (REMERON) 30 MG TABLET    Take 1 tablet (30 mg total) by mouth at bedtime.   SIMVASTATIN (ZOCOR) 10 MG TABLET    Take 1 tablet (10 mg total) by mouth at bedtime.   TRAMADOL (ULTRAM) 50 MG TABLET    Take 0.5 tablets (25 mg total) by mouth every 6 (six) hours as needed.   VITAMIN D, ERGOCALCIFEROL, (DRISDOL) 50000 UNITS CAPS CAPSULE    Take 1 capsule (50,000 Units total) by mouth every 7 (seven) days.  Modified Medications   No medications on file  Discontinued Medications   No medications on file

## 2017-02-28 NOTE — Patient Instructions (Addendum)
Today we updated your med list in our EPIC system...    Continue your current medications the same...  Continue the TRAMADOL 50mg  + tylenol for the pain- you may take this combo up to 3 times daily...  Use a heating pad to help when you are sitting & watching TV...  BE CAREFUL -- no falling allowed!!! And tell Lulu to behave herself...  Your next f/u visit was sched for 03/15/17 -- let's push this out a motn towards the end of September.Marland KitchenMarland Kitchen

## 2017-03-06 ENCOUNTER — Other Ambulatory Visit: Payer: Self-pay | Admitting: Adult Health

## 2017-03-06 NOTE — Telephone Encounter (Signed)
Sent to the pharmacy by e-scribe.  Pt due for yearly 07/2017.

## 2017-03-07 ENCOUNTER — Other Ambulatory Visit: Payer: Self-pay | Admitting: Adult Health

## 2017-03-07 NOTE — Telephone Encounter (Signed)
Ok to refill for 30 days  

## 2017-03-07 NOTE — Telephone Encounter (Signed)
Called to the pharmacy and left on machine. 

## 2017-03-15 ENCOUNTER — Ambulatory Visit: Payer: Medicare Other | Admitting: Pulmonary Disease

## 2017-03-19 ENCOUNTER — Other Ambulatory Visit: Payer: Self-pay | Admitting: Adult Health

## 2017-03-20 NOTE — Telephone Encounter (Signed)
Please see if she still needs this. She should be able to transition to OTC pain medications at this point

## 2017-03-21 NOTE — Telephone Encounter (Signed)
Called to the pharmacy and left on machine.  Pt notified to transition to OTC pain medications.

## 2017-03-21 NOTE — Telephone Encounter (Signed)
Pts caregiver is calling needing the tramadol and has been out of the medication for 3 days.   Pharm:  [facility] [address].

## 2017-03-21 NOTE — Telephone Encounter (Signed)
Caregiver called and requested refill

## 2017-03-21 NOTE — Telephone Encounter (Signed)
Ok to refill for 30 days. I would like her to transition to OTC medications during this time

## 2017-03-30 ENCOUNTER — Ambulatory Visit: Payer: Medicare Other | Admitting: Adult Health

## 2017-04-11 ENCOUNTER — Ambulatory Visit (INDEPENDENT_AMBULATORY_CARE_PROVIDER_SITE_OTHER): Payer: Medicare Other

## 2017-04-11 ENCOUNTER — Ambulatory Visit (INDEPENDENT_AMBULATORY_CARE_PROVIDER_SITE_OTHER): Admitting: Pulmonary Disease

## 2017-04-11 ENCOUNTER — Encounter: Payer: Self-pay | Admitting: Pulmonary Disease

## 2017-04-11 VITALS — BP 126/74 | HR 93 | Temp 96.7°F | Ht 64.5 in | Wt 122.4 lb

## 2017-04-11 DIAGNOSIS — F411 Generalized anxiety disorder: Secondary | ICD-10-CM | POA: Diagnosis not present

## 2017-04-11 DIAGNOSIS — Z23 Encounter for immunization: Secondary | ICD-10-CM

## 2017-04-11 DIAGNOSIS — J9611 Chronic respiratory failure with hypoxia: Secondary | ICD-10-CM

## 2017-04-11 DIAGNOSIS — I679 Cerebrovascular disease, unspecified: Secondary | ICD-10-CM

## 2017-04-11 DIAGNOSIS — I5032 Chronic diastolic (congestive) heart failure: Secondary | ICD-10-CM

## 2017-04-11 DIAGNOSIS — J449 Chronic obstructive pulmonary disease, unspecified: Secondary | ICD-10-CM | POA: Diagnosis not present

## 2017-04-11 DIAGNOSIS — W19XXXD Unspecified fall, subsequent encounter: Secondary | ICD-10-CM

## 2017-04-11 MED ORDER — IPRATROPIUM-ALBUTEROL 0.5-2.5 (3) MG/3ML IN SOLN: 3.0000 mL | Freq: Two times a day (BID) | RESPIRATORY_TRACT | 6 refills | Status: DC

## 2017-04-11 NOTE — Progress Notes (Signed)
Subjective:     Patient ID: Heidi [name], female   DOB: [birth year], 76 y.o.   MRN: [id number]  HPI 76 y/o WF former smoker who quit 2016 w/ severe COPD/emphysema & chronic hypoxemic and hypercarbic resp failure, placed on Hospice by Heidi [name]/ Heidi [name] in 2016, hx pneumonia and small bilat pulm nodules on CT Chest, and diastolic CHF...   ~  May 13, 2015:  Initial pulmonary consult by SN>        Heidi [name] was Heidi [name] 10/3 - 04/27/15 by [facility] after presenting via EMS w/ cough, weakness, increased SOB; she was still smoking, not on home O2; found to have COPD exac & CAP w/ bibasilar opacities, acute on chronic hypoxemic & hypercarbic resp failure, & superimposed CHF w/ bilat L>R pleural effusions (this was tapped 10/9 yielding transudative fluid, neg cytology, neg culture)... She was seen by Heidi [name] attended her in the hosp; Treated w/ Rocephin/ Zithromax, Solumedrol, Nebs, & brief stay in step-down unit on BiPap; disch on ADVAIR100-1spBid, SPIRIVA daily, PRED taper, NEBS w/ Albut... She was also treated for acute on chronic diastolic CHF- mod dil RV & reduced RV sys function (w/ preserved LVF) on 2DEcho & diuresed w/ Lasix but only disch on 38m prn for edema... Hospice was consulted (Heidi [name]'s note reviewed) and she has entered the Hospice program (she is a DNR, DNI) w/ a prognosis est <681mo she was disch on ROXANOL 2012ml--take 1/2 ml Q2H as needed for pain, SOB, anxiety...  She last saw Heidi [name] 11/2013, she has established new PCP- Heidi PengDrBurchette- has post hosp f/u pending; Note> she cancelled 2 appts w/ Heidi [name] recently...       Currently Heidi [name] is very stoic- states she's not having any breathing problems at the present time & credits the thoracentesis w/ her remarkable improvement; her last cigarette was 04/19/15 prior to her admission; she was disch on O2 at 4L/min & has an O2sat=89% on this in the office today (RA sat was 74%); she was unable to perform PFT today; we had  a frank discussion about her severe, end-stage COPD...       Epic records indicate that she was seen by Heidi [name] in 2011> on Spiriva +/- Advair, stated she quit smoking 2011 on adm for COPD exac, PFT reported FEV1=1.32 (77%), FEV1/FVC ratio=50%, DLCO=70%; CXR showed COPD & biapical pleuroparenchymal scarring, CT Chest 4/11 showed ?mult pulm nodules ?early cavitation r/o MAI, cultures- only NTF, O2sat=95% on RA...  Medical Hx>   She had an annual wellness exam from her PCP 02/2015> COPD, Hx pulm nodule, Hx of pneumonia; Hypothyroidism; adenomatous colonic polyps; Hyperlipidemia; Sigmoid volvulus; Depression; Cerebrovascular disease; Positive ANA; Diverticulosis; Esophageal stricture; Arthritis; History of stroke; Internal hemorrhoids; and Rectal ulcer; protein-calorie malnutrition (she weighs 78 lbs...  EXAM shows Afeb, VSS, O2sat=89% on O2 at 4L/min; Wt=78#, 5'2"Tall, BMI=16;  Heent- neg, mallampati1;  Chest- decr BS bilat, scat rhonchi, & end-exp wheezing;  Heart- RR gr1/6 SEM w/o r/g;  Abd soft, neg;  Ext- w/o c/c/ tr edema...   Baseline CXR 12/31/14 showed hyperinflation c/w COPD, biapical pleuroparenchymal scarring, nipple shadows and T12 compression fx...  CT Chest 02/22/15 showed cardiomeg, atherosclerotic changes in Ao & coronaries, small right effusion; emhysema w/ several additional findings- RUL & lingular subpleural opacities, & 5mm14mL & RLL nodules; osteopenia and T7 +T12 compression fractures...   CXRs performed during the 04/2015 Admission showed cardiomeg, CHF w/ left effusion, COPD w/ interstitial edema & LLL opac => improved post thoracentesis  10/9 w/ 400cc removed...  ABGs during the 10/16 Hosp showed pCO2 in the 73-87 range w/ resp acidosis...   LABS 10/16 reviewed> CBC- ok w/ Hg=13 range;  Chems- note HCO3=44-45 range & she may benefit drom Acetazolamide added to her diuretic regimen if more aggressive treatment is desired...  IMP>> Heidi [name] has severe COPD w/ acute on chronic hypoxemic &  hypercarbic resp failure & cor pulmonale; her last cigarette was the day of admission 04/19/15... She also has a hx of poor compliance w/ med rx & she has once again stopped the prescribed meds perceiving that she is much better since the thoracentesis; she was referred to Hospice for outpt follow up & will continue to see her PCPs at [name]; I told her I would be happy to recheck at any time if she so desired...  NOTE> since her disch she states that her breathing has been "so much better" that she hasn't needed her inhalers or the nebulizer, and she's off the Pred; she states she is taking the ASA81, Lasix20, Synthroid137, Xanax, and Remeron; she apparently hasn't needed the Roxanol yet...  We had a frank discussion about her end-stage disease & we "proved it to her" by checking her O2sat off the oxygen (74%), and attempting to get a Spirometry tracing (she couldn't do it);  Pt is asked to get back on her meds & we outlined a regimen>  NEBS w/ Albut Qid (breakfast, lunch, dinner, & bedtime);  ADVAIR100- 2spBid (taken after the Neb at breakfast & dinner);  SPIRIVA via handihaler once daily (taken after the Neb at lunch)...   ~  January 10, 2016:  8mo ROV w/ SN>  NOTE: family has moved her into her own apt 12/2015) next to her Sister-in-law/ care giver's apt      Heidi [name] returns for a follow up visit- she has severe COPD/emphysema w/ chronic hypoxemic and hypercarbic resp failure; she was [facility] on [name] by [name] in Oct2016; she has been followed by NP-CoryNafziger since then;  She returns today w/ her sister-in-law care giver Heidi [name] #[phone] indicating that Hospice is planning to stop services soon but pt is c/o severe LBP x 6wks ever since she lifted a chlorox bottle & felt a "pop" in her back; she has mentioned this to Hospice but all they have done is to adjust her pain meds (Percocet5-325 & Roxanol 20mg/ml- 0.5ml Q2H prn) without further eval;  She states that her breathing is  OK- meaning she could get about in her apt before the episode of acute LBP 2wks ago;I note that she has not had any lab work or f/u CXR since her disch from the Hosp 04/2015 & she is clearly in need of same- she has a hx of compression fx of spine and osteoporosis but not on any bone building therapy... We reviewed the following medical problems during today's office visit >>     Severe COPD> she could not perform simple spirometry when attempted 04/2015; on NEBS w/ Albut Q6H prn but it makes her too jittery, SYMBICORT160-2spBid & not using the SPIRIVA due to $$;  REC to use Albut 1/2vial in NEB Tid regularly followed by Symbicort160-2spBid & try INCRUSE once daily => we plan ROV in 6wks w/ repeat spirometry & oximetry if she can walk...     Chronic hypoxemic and hypercarbic resp failure> O2sat was 74% on RA 10/16 and ABGs in Hosp showed pCO2=73-87 w/ resp acidosis; she is now on O2 at 2.5L/min regularly &   looks much better, chest is clearer and labs improved; we will check ambulatory oximetry on ret if able to walk for [place]...    Hx pneumonia> resolved- CXR 12/2015 shows COPD & is clear, no opacity...    Hx bilat pulm nodules on CT Chest> Her CXR is much improved 01/10/16; she will be due for a f/u CT Chest on return 02/2016...    Ex-smoker, quit 04/2015>  congrats on not smoking!    Diastolic CHF w/ pulm edema & transudative left pleural effusion 04/2015> on Lasix20 prn only    Hx cerebrovasc dis & stroke> this must have occurred in [place]- no data in EPIC & no prev CDopplers to review; rec to take ASA81/d & we will check CDopplers later...    MEDICAL Issues>  Hyperlipidemia (not on meds),  Hypothyroidism (on Synthroid137/d),  Prot-cal malnutrition (wt is up 38# & BMI=21 now),      Hx HH repair & esoph stricture> not on PPI rx at present    Hx colon polyps, divertics, hems (w/ banding), & cecal volvulus s/p right hemicolectomy at [facility] 2012> aware...    S/P TAH & BSO> aware    DJD, Back pain w/  compression fxs> she indicates that she "cracked my spine" about 67yr ago, says she was hosp x4d w/ MRI, had brace applied- no shots or vertebroplasty ([facility] are [facility] reports in Epic from [facility]-2016;      Osteoporosis> she had BMD 01/2015 by PCP w/ TScore -4.8 in Lspine & -3.7 in Left FemNeck; placed on Ca++, VitD OTC, and FOSAMAX70/wk started; she picked up #4 tabs 02/11/15 but never refilled the med...    Hx anxiety/ depression> on XANAX 0.571mid prn and REMERON 1559mhs prn...    Hx poor compliance w/ medical regimen> this was before Hospice involvement in her care (04/2015) EXAM shows Afeb, VSS, O2sat=97% on O2 at 2.5L/min; Wt was 116# in [id number] 38# to BMI=21);  Heent- neg, mallampati1;  Chest- decr BS bilat w/o w/r/r;  Heart- RR gr1/6 SEM w/o r/g;  Abd soft, neg;  Ext- w/o c/c/e in wheelchair & won't stand/walk due to LBP...  CXR 01/10/16>  Much improved- some hyperinflation, bilat apical pleural thickening & bibasilar scarring, no effusion/ edema/ consolidation/ adenopathy, stable ant wedging of T12 w/ kyphosis...   Lumbar spine XRay 01/10/16>  Anterior wedging of L2 is new, old fx of T12, no other fxs & no spondylolithesis- disc sp narrowing L2-3 & L3-4, facet arthritis L5-S1, calcif in abd Ao...  LABS 12/2015>  Chems- wnl, HCO3=32, renal=wnl;  CBC- wnl;  TSH=0.01 on synthroid137;  BNP=70;  Sede=3 IMP/PLAN>>  Respiratory-wise she is improved, CXR is improved, etc but she needs to continue taking O2 and meds regularly- O2 at 2L/min by [place] 24/7, NEBS w/ Albut using 1/2 vial TID every day followed by SYMBICORT160- 2spBid and new INCRUSE one inhalation daily;  XRay of Lspine shows new compression in L2, and Epic review showed that she only took Alendronate70 for 4 wks in Aug2016- she never refilled the Rx;  Discussed CXR & Lumbar films w/ Sister-in-law care giver Heidi [name]=> REC that Heidi [name] re-start Calcium/ VitD/ ALENDRONATE70/wk & stay on this; she has a back  brace at home & pain meds from Hospice, needs PT for ambulation;  Second issue is her oversuppressed TSH on Synth137=> REC decr dose to 112 & recheck TSH on return... ROV planned in 6wks time.  ~  February 23, 2016:  6wk ROV & f/u w/ SN>  Heidi [name] has done remarkably well from the pulm standpoint- stabilized and improved suffic to be able to move into her own appt & care for herself w/ family support nearby; she says she feels like she's getting better too; she was working w/ PT at home but she thinks they were working her too hard & she sent them away- I explained that this therapy is the MOST IMPORTANT thing for her continued recovery!  She tells me that this is the last month of her yr on Hospice, she has Roxanol on her med list but thank goodness she hasn't needed it!  She is in a wheelchair in the office today- but tells me she is up & about at home=> we will walk her today & check O2sats...    1) Severe COPD> she could not perform simple spirometry when attempted 04/2015; on NEBS w/ Albut Q6H prn & using 1/2 vial 2-3x per wk, SYMBICORT160-2spBid & Incruse once daily; Hx medication noncompliance & not using the NEBS Tid as requested!     2) Chronic hypoxemic and hypercarbic resp failure> O2sat was 74% on RA 10/16 and ABGs in Hosp showed pCO2=73-87 w/ resp acidosis; she is now on O2 at 2.5L/min regularly & looks much better, chest is clearer and labs improved; ambulatory oximetry 02/2016 showed no desaturation on 2.5L/min...     3) Hx pneumonia> resolved- CXR 12/2015 shows COPD & clear, no opacity...    4) Hx bilat pulm nodules on CT Chest> Her CXR is much improved 01/10/16; she will be due for a f/u CT Chest to check these nodules soon...    5) Ex-smoker, quit 04/2015>  congrats on not smoking!    6) Diastolic CHF w/ pulm edema & transudative left pleural effusion 04/2015> on Lasix20 prn only now    7) Hx cerebrovasc dis & stroke> this must have occurred in [place]- no data in EPIC & no prev CDopplers to  review; rec to take ASA81/d & she will need CDopplers from her PCP on ret...    8) MEDICAL Issues>  Hyperlipidemia (not on meds),  Hypothyroidism (on Synthroid112/d),  Prot-cal malnutrition (wt is up to 114# & BMI=20 now); I reviewed all her TSH readings and called her Pharm ([facility] in [place]) & they confirmed poor med compliance in past- eery time her dose was increased she was NOT taking her meds regularly!  She is now on 112mcg/d and pill counts are accurate- TSH=0.02 so this dose is still too high for her; WE WILL DECR DOSE TO 75mcg/d and turn over follow up of this problem to her PCP- DrCory & Heidi [name]...    9) Hx HH repair & esoph stricture> not on PPI rx at present    10) Hx colon polyps, divertics, hems (w/ banding), & cecal volvulus s/p right hemicolectomy at [place] 2012> aware...    11) S/P TAH & BSO> aware    12) DJD, Back pain w/ compression fxs> she indicates that she "cracked my spine" about 4yrs ago, says she was hosp x4d w/ MRI, had brace applied- no shots or vertebroplasty (this must have been done at  hosp as there are no XRay reports in Epic from 2012-2016; she was taking Roxanol for pain but will be leaving hospice soon & her PCP will assume care of her osteoporosis, back pain=> pain meds and bone building therapy.    13) Osteoporosis> she had BMD 01/2015 by PCP w/ TScore -4.8 in Lspine & -3.7 in   Left FemNeck; placed on Ca++, VitD OTC, and FOSAMAX70/wk started; she picked up #4 tabs 02/11/15 but never refilled the med...    14) Hx anxiety/ depression> on XANAX 0.5mgTid prn and REMERON 15mg Qhs prn...    15) Hx poor compliance w/ medical regimen> this was before Hospice involvement in her care (04/2015) EXAM shows Afeb, VSS, O2sat=97% on O2 at 1.5L/min; Wt = 114#, BMI=21;  Heent- neg, mallampati1;  Chest- decr BS bilat w/o w/r/r;  Heart- RR gr1/6 SEM w/o r/g;  Abd soft, neg;  Ext- w/o c/c/e in wheelchair today...  Ambulatory oximetry 02/23/16>  O2sat=94% on 1.5L/min at rest  w/ pulse=82/min;  She ambulated 3 Laps pushing her wheelchair w/ lowest O2sat=91% w/ HR=110/min...  LABS 02/23/16 showed TSH=0.02 on Levothy 112mcg/d taken regularly (confirmed by [facility] [place])... IMP/PLAN>>  Caterina has stabilized from the cardiopulm standpoint & she is instructed to continue her current meds regularly Symbicort & Incruse) plus her NEBS, VentHFA, & Lasix20 as needed;  Looks like she needs CDoppler f/u, f/u FLP, management of her thyroid medication to insure compliance w/ Rx, and management of her osteoporosis/ bone building therapy & her back pain due to compression fxs => note sent to her PCP for all these...  ~  April 10, 2016:  6wk ROV w/ SN>  Heidi [name] returns for an add-on visit after she received visit here in [place] from family in [place] including grandkids w/ URI;  She was coughing up green mucus, called Hospice & they did a portable at home CXR that is said to be OK, clear, and treated her w/ Prednisone, "It was just a virus" she says, now improved... She is actually doing very well w/o acute resp symptoms and she has remained on her Pred10, NEBS w/ Albut- 1/2 vial Tid, Symbicort160-2Bid, Incruse once daily... She is c/o her port O2 system-- has difficulty filling the tanks, too heavy for her, she's "scared of it"=> needs POC (wants simply-Go mini) & we will contact [facility] to change out her system... She is also due for her follow up CT Chest to compare to 02/2015...    Since she was here last she had a f/u visit w/ PCP- [name] at [name]> f/u depression- improved on Remeron for sleep, LBP- hospice XRays reported neg but showed L2 & T12 compressions- given Medrol dosepak & Salonpas patches... He has since reviewed my last OV note w/ requests for [facility] f/u for her medical issues- hypothyroid w/oversuppressed TSH on Synthroid (we cut her down to 75mcg/d), osteoporosis & compression fxs, due for f/u CDopplers...  SEE PROB LIST ABOVE>>     EXAM shows Afeb, VSS, O2sat=95% on O2  at ?3L/min; Wt = 120#, BMI=22;  Heent- neg, mallampati1;  Chest- decr BS bilat w/o w/r/r;  Heart- RR gr1/6 SEM w/o r/g;  Abd soft, neg;  Ext- w/o c/c/e;  Neuro- weak, no focal deficits...  LABS 04/10/16 per Heidi> TSH=0.67 on Synthroid75- continue same;  VitD=16 & needs supplement w/ 50K weekly... IMP/PLAN>>  COPD/ chr resp failure stable on current regimen- continue same;  Thyroid is improved on the 75mcg/d taken regularly- continue same;  She needs VitD and bone building therapy=> start VitD50K per week and ALENDRONATE 70mg one per week- must get these filled regularly & take them weekly going forward without fail...  ~  May 11, 2016:  1mo ROV & add-on appt requested for dyspnea> Heidi [name] returns for an add-on visit "because I couldn't get thru to DrCory" c/o incr SOB, hard to cough   up any phlegm, denies f/c/s, c/o no appetite, shakey/ snappy/ agitated and incr SOB w/ activity & ADLs, feels like she can't get a deep breath, "anxious all the time"- says all this occurred when PRED weaned to 1/2 tab daily & she's been off x 2wks now; Hospice came out to her house & did a CXR- reported to be clear/ NAD (not avail for review, last CXR in Epic 01/10/16=> COPD, NAD, kyphosis & compression T12;...     EXAM shows Afeb, VSS, O2sat=96% on O2 at 3L/min; Wt = 124#, BMI=22;  HEENT- neg, mallampati1;  Chest- decr BS bilat w/o w/r/r;  Heart- RR gr1/6 SEM w/o r/g;  Abd soft, neg;  Ext- w/o c/c/e;  Neuro- weak, no focal deficits... IMP/PLAN>>  Marenda has been using her NEBULIZER just prn & averaging once qod or so- we discussed a regular treatment regimen that includes using her NEB w/ 1/2 vial of Albut Tid regularly followed by her Symbicort160-2spBid & the Incruse once daily;  We will restart a systemic steroid med- try MEDROL 8mg tabs (one Bid for 10d then down to 1Qam til return);  In addition she will increase her Alprazolam to 1 tab Tid regularly (she's been on 1Bid but still anxious);  Keep other meds the same including  Alendronate70 once per week, Vit D 50K once per week, and Synthroid75 daily 1st thing in the Am;  We plan ROV recheck in 3wks time...  ~  June 05, 2016:  1mo ROV & add-on appt at request of Hospice nurse who said she had a "little rattle" in her chest- pt denies symptoms indicating chr stable SOB/DOE, no cough/ sput/ hemoptysis/ CP/ edema...  As noted Heidi [name] has severe COPD (on Medrol8, NEBS w/ 1/2 vial Albut Tid followed by Symbicort160-2spBid & Incruse once daily); chr hypoxemic & hypercarbic resp fail (on O2 at 2.5L/min); Abn Chest CT w/ bilat pulm nodules; Ex-smoker (quit 04/2015); Hx pneumonia, Hx diastolic CHF w/ pulm edema & transudative L effusion 04/2015 (on Lasix20)... Her dyspnea is multifactorial w/ all these problems in addition to anxiety & poor medication compliance...    EXAM shows Afeb, VSS, O2sat=94% on O2 at 2L/min; Wt = 130# up 7#;  HEENT- neg, mallampati1;  Chest- decr BS bilat w/ few velcro rales at bases;  Heart- RR gr1/6 SEM w/o r/g;  Abd soft, neg;  Ext- w/o c/c/e;  Neuro- weak. IMP/PLAN>>  Heidi [name] is concerned about the 7# wt gain on Medrol8 so we decided to decr this to 1/2 tab Qam; otherw continue same meds & we plan ROV in 3mo...  ~  June 19, 2016:  2wk ROV & add-on appt requested for head & chest congestion, cough w/ thick green mucus, and SOB- can't get a DB; pt denies hemoptysis/ f/c/s/ CP/ edema/ etc... As noted Heidi [name] has severe COPD (on Medrol8-1/2 daily (but she stopped it on her own), NEBS w/ 1/2 vial Albut Tid followed by Symbicort160-2spBid & Incruse once daily); chr hypoxemic & hypercarbic resp fail (on O2 at 2.5L/min); Abn Chest CT w/ bilat pulm nodules; Ex-smoker (quit 04/2015); Hx pneumonia, Hx diastolic CHF w/ pulm edema & transudative L effusion 04/2015 (on Lasix20)... Her dyspnea is multifactorial w/ all these problems in addition to anxiety (on Xanax0.5mg but not taking regularly) & poor medication compliance (eg- still only using NEB bid)... NOTE> she called PCP  but didn't get response, Hospice stopped ~1wk ago; she wants POC...    EXAM shows Afeb, VSS, O2sat=93% on O2 at 2L/min; Wt = 131#;    HEENT- neg, mallampati1;  Chest- decr BS bilat w/ few velcro rales at bases;  Heart- RR gr1/6 SEM w/o r/g;  Abd soft, neg;  Ext- w/o c/c/e;  Neuro- weak.  CXR 06/19/16 (independently reviewed by me in the PACS system) showed COPD & incr markings both bases c/w Atx- chr changes include biapical pleural thickening, cardiomeg, Ao atherosclerosis, modHH, T12 compression fx...   LABS 06/19/16>  Chems- ok w/ HCO3=31 otherw wnl;  CBC- wnl w/ wbc=8.4;  Sed=49;  BNP=19... IMP/PLAN>>  Once again Heidi [name] is her own worse enemy by stopping the Medrol on her own & decreasing the Nebs to Bid; we spent extra time reviewing the rationale behind the Med Rx including Medrol (rec to incr to 74m/d again), NEBS w/ albut (1/2 vial TID) followed by the Symbicort160-2spBid & Incruse once daily; asked to incr the Mucinex12020mBid w/ fluids, and take the Alprazolam more regularly eg- 1/2 tab tid,  +continue her O2 etc... She requests a POC so she can incr her mobility... We plan ROV in 43m74mor recheck... Note: >50% of this 73m38misit was spent in counseling and coordination of care...  ~  August 14, 2016:  43mo 66mo& pulmonary follow up visit>  Heidi Lisetrts a good interval & indicates that her breathing is at baseline; once again however she has decreased the NEBs to just 1-2/d & she tells me that sometimes she skips a day (she believes that this means she is doing better); she is using the Symbicort & Incruse regularly; she remains on the Medrol8mg t67m taking 1/2 tab daily; she remains on O2 at 2L/min flow but still has the port tanks and [facility]- she tells me that she knows that insurance will cover this because she has Medicare, [employer]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 We reviewed the following medical problems during today's office visit >>     1) Severe COPD> she could not perform simple spirometry when attempted 04/2015; on MEDROL8-1/2Qam, NEBS w/ Albut Tid & using 1/2 vial 1-2x/d, SYMBICORT160-2spBid & Incruse once daily; Hx medication noncompliance & not using the NEBS Tid as requested!     2) Chronic hypoxemic and hypercarbic resp failure> O2sat was 74% on RA 10/16 [place]73[place]87 w/ resp acidosis; she is now on O2 at 2.5L/min regularly & stable, chest is clearer and labs improved; ambulatory oximetry 02/2016 showed no desaturation on 2.5L/min...     Marland KitchenMarland Kitchen Hx pneumonia> resolved- last CXR 06/2016 shows COPD & clear, no opacity...    4Marland KitchenMarland KitchenHx bilat pulm nodules on CT Chest> Her CXRs are much improved; her last CT Chest was 02/2015 & she never got the planned 03/2016 f/u CT scan...    5) Ex-smoker, quit 04/2015>  congrats on not smoking!    6) Diastolic CHF w/ pulm edema & transudative left pleural effusion 04/2015> on Lasix20 prn for edema...    7Marland KitchenMarland KitchenHx cerebrovasc dis & stroke> this must have occurred [place] in EPIC & no prev CDopplers to review; rec to take ASA81/d & DrCory never ordered the CDopplers...    8) MEDICAL Issues>  Hyperlipidemia (not on meds),  Hypothyroidism (on Synthroid75/d),  Hx Prot-cal malnutrition (wt is up to 132# & BMI=23 now); Note>  I reviewed all her TSH readings and called her Pharm [facility] in [place]) & they confirmed poor med compliance in past- every time her dose was increased she was NOT taking her meds regularly!  on 159mg/d and pill counts are accurate- TSH=0.02 so this dose is still too high for her; WE DECR DOSE TO 759m/d and turn over follow up of this problem to her PCP- DrCory & Heidi [name]...    9) Hx HH repair & esoph stricture> not on PPI rx at present    10) Hx colon polyps, divertics, hems (w/  banding), & cecal volvulus [facility]> aware...    11) S/P TAH & BSO> aware    12) DJD, Back pain w/ compression fxs> she indicates that she "cracked my spine" about 4y59yrgo, says she was hosp x4d w/ MRI, had brace applied- no shots or vertebroplasty (this must have been [facility] there are no XRay reports in Epic from 2012-2016; she was taking Roxanol for pain but will be leaving hospice soon & her PCP will assume care of her osteoporosis, back pain=> pain meds and bone building therapy.    13) Osteoporosis> she had BMD 01/2015 by PCP w/ TScore -4.8 in Lspine & -3.7 in Left FemNeck; placed on Ca++, VitD OTC, and FOSAMAX70/wk started=> but compliance is poor...    14) Hx anxiety/ depression> on XANAX 0.5mg82m prn and REMERON 15mg37m prn...    15) Hx poor compliance w/ medical regimen> this was before Hospice involvement in her care (04/2015)    EXAM shows Afeb, VSS, O2sat=93% on O2 at 2L/min; Wt = 131#;  HEENT- neg, mallampati1;  Chest- decr BS bilat w/ few velcro rales at bases;  Heart- RR gr1/6 SEM w/o r/g;  Abd soft, neg;  Ext- w/o c/c/e;  Neuro- weak. IMP/PLAN>>  We spent some time again reviewing the need/ rationale behind the Tid NEBS w/ 1/2 vial of Albut followed by the Symbicort Bid & Spiriva once daily; she has requested that we refill her Xanax0.5mgBi51mSynthroid75/d, and Remeron30mgQh37me [facility] pt's use... we plan ROV in 3 months...  ~  November 13, 2016:  76mo ROV69mo/u of her COPD w/ chr hypoxemic & hypercarbic resp failure> Heidi [name] calDaneillerecently w/ c/o URI, cough w/ yellow mucus & blood streaks, on her O2 at 2L/min 24/7, Medrol8mg- 1/237mily, NEBS w/ Albut using 1/2 vial but only prn & ave 1-2/wk, Symbicort160-2spBid, Incruse one daily;  We called in Levaquin Ravenad now...    As noted she's been off Hospice since 05/2016 & no longer taking Roxanol & Percocet; she uses Neurontin & Aleve for pain;   Her PCP is "DrCorey" & last seen 07/2016...     Last labs 06/2016 reviewed in Epic> HCO3=31, Cr=0.57, BNP=19, CBC=wnl... EXAM shows Afeb, VSS, O2sat=96% on O2 at 2L/min; Wt = 130#;  HEENT- neg, mallampati1;  Chest- decr BS bilat w/ few velcro rales at bases;  Heart- RR gr1/6 SEM w/o r/g;  Abd soft, neg;  Ext- w/o c/c/e;  Neuro- weak. IMP/PLAN>>  We decided to slowly wean the Medrol8mg tabs=27mown to 1/2 tab Qod, continue other meds regularly as prescribed; stay as active as poss at home...   ~  February 28, 2017:  76mo ROV & 576moon appt requested after a fall at home>  Pt reports fall at home 02/22/17- she lost her balance letting her dog (Lulu) out & fell onto  the dog crete, hit her ant chest wall w/ bruise & pain, didn't hit head/ no LOC;  She went to see DrCorey- note reviewed, tender over sternum, CXR w/ concern for sternal fx, and subseq CT Chest 02/26/17=> atherosclerotic calcif in Ao & coronaries, no aneurysm, no adenopathy, mod HH, scarring in lung apicies & centrilob emphysema, mild atx at bases, fx of mid sternum w/ 83m post displacement of the distal fx fragment, mod ant wedging of L2 & L7... EXAM shows Afeb, VSS, O2sat=90% on O2 at 2L/min; Wt = 128#;  HEENT- neg, mallampati1;  Chest- tender over sternum, bruising, decr BS bilat w/ few velcro rales at bases;  Heart- RR gr1/6 SEM w/o r/g;  Abd soft, neg;  Ext- w/o c/c/e;  Neuro- weak. IMP/PLAN>>  We discussed rx w/ rest, heat, TRAMADOL50 + Tylenol500, exercise extreme caution-- no falling allowed!   ~  April 11, 2017:  6wk ROV & JGraceanneis improved, sternal fx & CWP are better w/ conservative rx, [place] it [place] her out but she did ok;  Chest is improved, less tender, min cough, no sput, no hemoptysis, and SOB/DOE is about the same she says;  She reports that she is supposed to recertify for her O2 at this time=> see below, she has a home concentrator and a POC for ambulation...   We reviewed the following  medical problems during today's office visit >>     1) Severe COPD> she could not perform simple spirometry when attempted 04/2015; on MEDROL8-1/2Qam, NEBS w/ Albut Tid but NOT using regularly, SYMBICORT160-2spBid but NOT using regularly & Incruse- not using at all; Hx medication noncompliance & not using the NEBS Tid as requested!     2) Chronic hypoxemic and hypercarbic resp failure> O2sat was 74% on RA 10/16 and [place]=73-87 w/ resp acidosis; she is now on O2 at 2.5L/min regularly & stable, chest is clearer and labs improved; ambulatory oximetry 02/2016 showed no desaturation on 2.5L/min..Marland KitchenMarland Kitchen    3) Hx pneumonia> resolved- last CXR 8/18 shows sternal fx after fall, COPD/emphysema, HH & old compression deformities in spine    4) Hx bilat pulm nodules on CT Chest> Her CXRs were much improved; her last CT Chest was 8/18 & showed mid sternal fx after fall; centrilob emphysema; aortic, great vessel, & coronary atherosclerotic calcif; no adenopathy; mod HH; prior T7 T12 L2 compressions..Marland KitchenMarland Kitchen   5) Ex-smoker, quit 04/2015>  congrats on not smoking!    6) Diastolic CHF w/ pulm edema & transudative left pleural effusion 04/2015> on Lasix20 prn for edema..Marland KitchenMarland Kitchen   7) Hx cerebrovasc dis & stroke> [place] & no prev CDopplers to review; rec to take ASA81/d & DrCory never ordered the CDopplers...    8) MEDICAL Issues>  Hyperlipidemia (not on meds),  Hypothyroidism (on Synthroid75/d),  Hx Prot-cal malnutrition (wt is 122# & BMI=20-21); Note> I reviewed all her TSH readings and [place] & they confirmed poor med compliance in past- every time her dose was increased she was NOT taking her meds regularly!  on 1131m/d and pill counts are accurate- TSH=0.02 so this dose is still too high for her; WE DECR DOSE TO 7556md and turn over follow up of this problem to her PCP- DrCory & Heidi [name]...    9) Hx HH repair & esoph stricture> not on  [facility] rx at present[facility]...    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    11) S/P TAH & BSO> aware    12) DJD, Back pain w/ compression fxs> she indicates that she "cracked my spine" about 89yr ago, says she was hosp x4d w/ MRI, had brace applied- no shots or vertebroplasty ([facility] are [facility] reports in Epic from 2012-2016; she was taking Roxanol for pain but will be leaving hospice soon & her PCP will assume care of her osteoporosis, back pain=> pain meds and bone building therapy.    13) Osteoporosis> she had BMD 01/2015 by PCP w/ TScore -4.8 in Lspine & -3.7 in Left FemNeck; placed on Ca++, VitD OTC, and FOSAMAX70/wk started=> but compliance is poor...    14) Hx anxiety/ depression> on XANAX 0.531mid prn and REMERON 1572mhs prn...    15) Hx poor compliance w/ medical regimen> this was before Hospice involvement in her care (04/2015) EXAM shows Afeb, VSS, O2sat=92% on O2 at 2L/min; Wt = 122#;  HEENT- neg, mallampati1;  Chest- sl tender over sternum, bruising, decr BS bilat w/ few velcro rales at bases;  Heart- RR gr1/6 SEM w/o r/g;  Abd soft, neg;  Ext- w/o c/c/e;  Neuro- weak.  Ambulatory Oximetry 04/11/17>  O2sat on RA at rest is 85% and it dropped to 83% upon arising & walking to the door of the exam room => she therefore re-qualifies for HOME O2 at 2L/min at rest & Qhs + 2-3L/min w/ exercise. IMP/PLAN>>  Compliance with the prescribed medications has always been Heidi [name]'s weakest attribute-- reminded to do her meds EXACTLY as prescribed for max benefit and TO PROTECT HER HEART;  We have outlined a plan using NEBULIZER w/ Duoneb Bid (at breakfast & dinner), Symbicort160- 2sp Bid (at lunch & bedtime), O2 24/7 using 2L/min at rest & up to 3L/min w/ exercise, + Medrol8mg70m/2 tab every other day... She has a [employer] buyer for us tKoreacomplete- done;  Given 2018 FLU  vaccine today...    Past Medical History:  Diagnosis Date  . Arthritis   . Cerebrovascular disease   . Chronic diastolic CHF (congestive heart failure) (HCC)[place]9/2017  . COPD (chronic obstructive pulmonary disease) (HCC)[place]. Depression   . Diverticulosis   . Esophageal stricture   . History of pneumonia   . History of stroke    mild oncoming  . 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 Hyperlipidemia   . Hypothyroidism   . Internal hemorrhoids   . Osteoporosis   . Pneumonia    HX of  . Positive ANA (antinuclear antibody)   . Pulmonary nodule   . Rectal ulcer   . Shortness of breath    periodically with COPD flair up  . 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  Past Surgical History:  Procedure Laterality Date  . BLADDER SURGERY     tack  . CATARACT EXTRACTION     bilateral  . COLONOSCOPY  06/22/2011   Procedure: COLONOSCOPY;  Surgeon: DoraLafayette Dragon;  Location: [facility];  Service: Endoscopy;  Laterality: N/A;  . GASTRIC VARICES BANDING  06/22/2011   Procedure: HEMORRHOID BANDING;  Surgeon: DoraLafayette Dragon;  Location: [facility] ENDOSCOPY;  Service: Endoscopy;  Laterality: N/A;  . HEMICOLECTOMY  2012   emergent surgery [name]  .  HIATAL HERNIA REPAIR     x 2  . TONSILLECTOMY    . TOTAL ABDOMINAL HYSTERECTOMY W/ BILATERAL SALPINGOOPHORECTOMY  1998    Outpatient Encounter Prescriptions as of 04/11/2017  Medication Sig  . albuterol (PROVENTIL HFA;VENTOLIN HFA) 108 (90 BASE) MCG/ACT inhaler Inhale 2 puffs into the lungs every 6 (six) hours as needed for wheezing or shortness of breath.  Marland Kitchen alendronate (FOSAMAX) 70 MG tablet TAKE ONE TABLET BY MOUTH ONCE A WEEK. TAKE WITH A FULL GLASS OF WATER ON AN EMPTY STOMACH FIRST THING IN THE MORNING.  . budesonide-formoterol (SYMBICORT) 160-4.5 MCG/ACT inhaler Inhale 2 puffs into the lungs 2 (two) times daily.  . furosemide (LASIX) 20 MG tablet Take 1 tablet (20 mg total) by mouth daily as needed for fluid or  edema.  . gabapentin (NEURONTIN) 300 MG capsule TAKE 1 CAPSULE BY MOUTH TWICE DAILY  . halobetasol (ULTRAVATE) 0.05 % ointment Apply topically 2 (two) times daily.    Marland Kitchen levothyroxine (SYNTHROID) 75 MCG tablet Take 1 tablet (75 mcg total) by mouth daily before breakfast.  . LORazepam (ATIVAN) 1 MG tablet TAKE 1/2 TO 1 (ONE-HALF TO ONE) TABLET BY MOUTH THREE TIMES DAILY AS NEEDED **DISCONTINUE XANAX**  . Melatonin 5 MG CAPS Take by mouth.  . methylPREDNISolone (MEDROL) 8 MG tablet Take 1 daily x 14 days then 1/2 tablet daily thereafter (Patient taking differently: Take 1/2 tablet every other day)  . mirtazapine (REMERON) 30 MG tablet Take 1 tablet (30 mg total) by mouth at bedtime.  . simvastatin (ZOCOR) 10 MG tablet Take 1 tablet (10 mg total) by mouth at bedtime.  . traMADol (ULTRAM) 50 MG tablet TAKE 1/2 (ONE-HALF) TABLET BY MOUTH EVERY 6 HOURS AS NEEDED  . Vitamin D, Ergocalciferol, (DRISDOL) 50000 units CAPS capsule Take 1 capsule (50,000 Units total) by mouth every 7 (seven) days.  . [DISCONTINUED] albuterol (PROVENTIL) (2.5 MG/3ML) 0.083% nebulizer solution Use 1/2 vial of ALBUTEROL in NEBULIZER three times daily...  . ipratropium-albuterol (DUONEB) 0.5-2.5 (3) MG/3ML SOLN Take 3 mLs by nebulization 2 (two) times daily.   No facility-administered encounter medications on file as of 04/11/2017.     Allergies  Allergen Reactions  . Morphine And Related Nausea And Vomiting  . Penicillins Hives    Has patient had a PCN reaction causing immediate rash, facial/tongue/throat swelling, SOB or lightheadedness with hypotension: No Has patient had a PCN reaction causing severe rash involving mucus membranes or skin necrosis: No Has patient had a PCN reaction that required hospitalization No Has patient had a PCN reaction occurring within the last 10 years: No If all of the above answers are "NO", then may proceed with Cephalosporin use.    Immunization History  Administered Date(s) Administered   . H1N1 06/30/2008  . Influenza Split 04/18/2012  . Influenza Whole 06/10/2007, 04/14/2010, 04/18/2012  . Influenza, High Dose Seasonal PF 06/02/2015, 04/10/2016, 04/11/2017  . Influenza,inj,Quad PF,6+ Mos 04/16/2013  . Pneumococcal Conjugate-13 06/02/2015  . Pneumococcal Polysaccharide-23 05/17/2006, 04/16/2013  . Td 03/29/1999  . Tdap 04/16/2013  . Zoster 07/17/2013    Current Medications, Allergies, Past Medical History, Past Surgical History, Family History, and Social History were reviewed in [employer] record.   Review of Systems             All symptoms NEG except where BOLDED >>  Constitutional:  F/C/S, fatigue, anorexia, unexpected weight change. HEENT:  HA, visual changes, hearing loss, earache, nasal symptoms, sore throat, mouth sores, hoarseness. Resp:  cough, sputum,  hemoptysis; SOB, tightness, wheezing. Cardio:  CP, palpit, DOE, orthopnea, edema. GI:  N/V/D/C, blood in stool; reflux, abd pain, distention, gas. GU:  dysuria, freq, urgency, hematuria, flank pain, voiding difficulty. MS:  joint pain, swelling, tenderness, decr ROM; neck pain, back pain, etc. Neuro:  HA, tremors, seizures, dizziness, syncope, weakness, numbness, gait abn. Skin:  suspicious lesions or skin rash. Heme:  adenopathy, bruising, bleeding. Psyche:  confusion, agitation, sleep disturbance, hallucinations, anxiety, depression suicidal.   Objective:   Physical Exam       Vital Signs:  Reviewed...   General:  WD, thin, 76 y/o WM in NAD; alert & oriented; pleasant & cooperative... HEENT:  [place]/AT; Conjunctiva- pink, Sclera- nonicteric, EOM-wnl, PERRLA, EACs-clear, TMs-wnl; NOSE-clear; THROAT-clear & wnl.  Neck:  Supple w/ fair ROM; no JVD; normal carotid impulses w/o bruits; no thyromegaly or nodules palpated; no lymphadenopathy.  Chest:  Decr BS bilat, clear w/o w/r/r heard... Heart:  Regular Rhythm; ?S4, gr1/6 SEM Abdomen:  Soft & nontender- no guarding or rebound;  normal bowel sounds; no organomegaly or masses palpated. Ext:  decr ROM; without deformities +arthritic changes; no varicose veins, venous insuffic, no edema;  Pulses intact w/o bruits. Neuro:  No focal neuro deficits- pt in wheelchair & won't stand or walk due to LBP... Derm:  No lesions noted; no rash etc. Lymph:  No cervical, supraclavicular, axillary, or inguinal adenopathy palpated.   Assessment:      IMP>>     Severe COPD> she could not perform simple spirometry when attempted 04/2015; on NEBS w/ Albut Q6H prn but it makes her too jittery, SYMBICORT160-2spBid & not using the [name] due to $$;  REC to use Albut 1/2vial in NEB Tid regularly followed by Symbicort160-2spBid & try INCRUSE once daily => stay on these regularly...    Chronic hypoxemic and hypercarbic resp failure> O2sat was 74% on RA 10/16 and ABGs in [place] showed pCO2=73-87 w/ resp acidosis; she is now on O2 at 2.5L/min regularly & looks much better, chest is clearer and labs improved;  Ambulatory oximetry 02/2016 w/ sats in the 90s on 1.5L after 3 Laps- much improved...    Hx pneumonia> resolved- CXR 12/2015 shows COPD & is clear, no opacity...    Hx bilat pulm nodules on CT Chest> Her CXR is much improved 01/10/16; she is due for f/u CT Chest but can't get this til she is OFF Hospice!    Ex-smoker, quit 04/2015>  congrats on not smoking!    Diastolic CHF w/ pulm edema & transudative left pleural effusion 04/2015> on Lasix20 prn only    Hx cerebrovasc dis & stroke> this must have occurred in [place]- no data in EPIC & no prev CDopplers to review; rec to take ASA81/d & we will check CDopplers later...    MEDICAL Issues>  Hyperlipidemia (not on meds),  Hypothyroidism (on Synthroid137- oversuppressed w/ TSH=0.01 therefore decr dose to 189mg/d & recheck),  Prot-cal malnutrition (wt is up 38# & BMI=21 now),      Hx HH repair & esoph stricture> not on PPI rx at present    Hx colon polyps, divertics, hems (w/ banding), & cecal volvulus  [facility]...    S/P TAH & BSO> aware    DJD, Back pain w/ compression fxs> she indicates that she "cracked my spine" about 433yrago, says she was hosp x4d w/ MRI, had brace applied- no shots or vertebroplasty ([facility] are no [facility]  reports in Epic from 2012-2016;      Osteoporosis> she had BMD 01/2015 by PCP w/ TScore -4.8 in Lspine & -3.7 in Left FemNeck; placed on Ca++, VitD OTC, and FOSAMAX70/wk started; she picked up #4 tabs 02/11/15 but never refilled the med; WE STARTED VITD 50K per week & ALENDRONATE 72m/wk [id number].    Hx anxiety/ depression> on XANAX 0.551mid prn and REMERON 1535mhs prn...    Hx poor compliance w/ medical regimen> this was before Hospice involvement in her care (04/2015)  PLAN>>  01/10/16>   Respiratory-wise she is improved, CXR is improved, etc but she needs to continue taking O2 and meds regularly- O2 at 2L/min by [place] 24/7, NEBS w/ Albut using 1/2 vial TID every day followed by SYMBICORT160- 2spBid and new INCRUSE one inhalation daily;  XRay of Lspine shows new compression in L2, and Epic review showed that she only took Alendronate70 for 4 wks in Aug2016- she never refilled the Rx;  Discussed CXR & Lumbar films w/ Sister-in-law care giver Heidi [name]=> REC that Nicolemarie re-start Calcium/ VitD/ ALENDRONATE70/wk & stay on this (she never did); she has a back brace at home & pain meds from Hospice, needs PT for ambulation;  We decreased her Synthroid to 112m37m as well... ROV planned in 6wks time. 02/23/16>   JudyJahzara stabilized from the cardiopulm standpoint & she is instructed to continue her current meds regularly (Symbicort & Incruse) plus her NEBS, VentHFA, & Lasix20 as needed;  Looks like she needs CDoppler f/u, f/u FLP, management of her thyroid medication to insure compliance w/ Rx (we cut her to 75mc35mday), and management of her osteoporosis/ bone building therapy & her back pain due to compression fxs =>  note sent to her PCP... 04/10/16>   Sehaj Jins to continue her O2, Pred10, NEBS, Symbicort, Incruse regularly;  In addition she needs to stay on the Synthroid75mcg58mose regularly;  And finally needs VitD50K & Alendronate70 both weekly for her bones... 05/11/16>   Laiza hAkeilaeen using her NEBULIZER just prn & averaging once qod or so- we discussed a regular treatment regimen that includes using her NEB w/ 1/2 vial of Albut Tid regularly followed by her Symbicort160-2spBid & the Incruse once daily;  We will restart a systemic steroid med- try MEDROL 8mg ta23m(one Bid for 10d then down to 1Qam til return);  In addition she will increase her Alprazolam to 1 tab Tid regularly (she's been on 1Bid but still anxious);  Keep other meds the same including Alendronate70 once per week, Vit D 50K once per week, and Synthroid75 daily 1st thing in the Am;  We plan ROV recheck in 3wks time... 06/05/16>   Add on appt per Hospice nurse who heard rales at bases, pt is not acutely symptomatic- but is concerned about 7# wt gain on Medrol8 so we decided to decr this to 1/2 tab Qam; otherw continue same meds & we plan ROV in 24mo... 424mo/17>   Once again Tiffanye is [place] worse enemy by stopping the Medrol on her own & decreasing the Nebs to Bid; we spent extra time reviewing the rationale behind the Med Rx including Medrol (rec to incr to 8mg/d ag47m), NEBS w/ albut (1/2 vial TID) followed by the Symbicort160-2spBid & Incruse once daily; asked to incr the Mucinex1200mg Bid 6mluids, and incr the Alprazolam 1/2 tab tid, +continue her O2 etc... She requests a POC so she can incr her mobility... 08/14/16>   We spent some time again reviewing  the need/ rationale behind the Tid NEBS w/ 1/2 vial of Albut followed by the Symbicort Bid & Spiriva once daily; she has requested that we refill her Xanax0.5mBid, Synthroid75/d, and Remeron330mhs; [facility]... we plan ROV in 3 months... 11/13/16>    We  decided to slowly wean the Medrol8m8mabs=> down to 1/2 tab Qod, continue other meds regularly as prescribed; stay as active as poss at home...  02/28/17>   We discussed rx w/ rest, heat, TRAMADOL50 + Tylenol500, exercise extreme caution-- no falling allowed! 04/11/17>   Compliance with the prescribed medications has always been Heidi [name]'s weakest attribute-- reminded to do her meds EXACTLY as prescribed for max benefit and TO PROTECT HER HEART;  We have outlined a plan using NEBULIZER w/ Duoneb Bid (at breakfast & dinner), Symbicort160- 2sp Bid (at lunch & bedtime), O2 24/7 using 2L/min at rest & up to 3L/min w/ exercise, + Medrol8mg14m/2 tab every other day... She has a [employer] buyer for us tKoreacomplete- done;  Given 2018 FLU vaccine today   Plan:     Patient's Medications  New Prescriptions   IPRATROPIUM-ALBUTEROL (DUONEB) 0.5-2.5 (3) MG/3ML SOLN              Take 3 mLs by nebulization 2 (two) times daily at breakfast & dinner...  Previous Medications   ALBUTEROL (PROVENTIL HFA;VENTOLIN HFA) 108 (90 BASE) MCG/ACT INHALER    Inhale 2 puffs into the lungs every 6 (six) hours as needed for wheezing or shortness of breath.   ALENDRONATE (FOSAMAX) 70 MG TABLET    TAKE ONE TABLET BY MOUTH ONCE A WEEK. TAKE WITH A FULL GLASS OF WATER ON AN EMPTY STOMACH FIRST THING IN THE MORNING.   BUDESONIDE-FORMOTEROL (SYMBICORT) 160-4.5 MCG/ACT INHALER                 Inhale 2 puffs into the lungs 2 (two) times daily at lunch & bedtime...   FUROSEMIDE (LASIX) 20 MG TABLET    Take 1 tablet (20 mg total) by mouth daily as needed for fluid or edema.   GABAPENTIN (NEURONTIN) 300 MG CAPSULE    TAKE 1 CAPSULE BY MOUTH TWICE DAILY   HALOBETASOL (ULTRAVATE) 0.05 % OINTMENT    Apply topically 2 (two) times daily.     LEVOTHYROXINE (SYNTHROID) 75 MCG TABLET    Take 1 tablet (75 mcg total) by mouth daily before breakfast.   LORAZEPAM (ATIVAN) 1 MG TABLET    TAKE 1/2 TO 1 (ONE-HALF TO ONE) TABLET BY MOUTH THREE TIMES DAILY AS NEEDED  **DISCONTINUE XANAX**   MELATONIN 5 MG CAPS    Take by mouth.   METHYLPREDNISOLONE (MEDROL) 8 MG TABLET                  Take 1/2 tab in AM every other day...   MIRTAZAPINE (REMERON) 30 MG TABLET    Take 1 tablet (30 mg total) by mouth at bedtime.   SIMVASTATIN (ZOCOR) 10 MG TABLET    Take 1 tablet (10 mg total) by mouth at bedtime.   TRAMADOL (ULTRAM) 50 MG TABLET    TAKE 1/2 (ONE-HALF) TABLET BY MOUTH EVERY 6 HOURS AS NEEDED   VITAMIN D, ERGOCALCIFEROL, (DRISDOL) 50000 UNITS CAPS CAPSULE    Take 1 capsule (50,000 Units total) by mouth every 7 (seven) days.  Modified Medications   No medications on file  Discontinued Medications   ALBUTEROL (PROVENTIL) (2.5 MG/3ML) 0.083% NEBULIZER SOLUTION  Use 1/2 vial of ALBUTEROL in NEBULIZER three times daily.Marland KitchenMarland Kitchen

## 2017-04-11 NOTE — Patient Instructions (Signed)
Today we updated your med list in our EPIC system...    Continue your current medications the same...  Let's decrease the MEDROL 8mg  to 1/2 tab every other day (1/2, 0, 1/2, 0, etc)...  We decided to try and change her NEB Med to "Duoneb" --    Use it in the NEBULIZER twice daily (breakfast & dinner) using 1/2 to 1 vial...  Use the SYMBICORT -- 2 sprays twice daily at lunch & bedtime...  Today we did the walking test on room air to recertify your need for the home oxygen per Medicare requirements and [facility] request.     We also completed the [employer] form...  Call for any questions...  Let's plan a follow up visit in 28mo, sooner if needed for problems.Marland KitchenMarland Kitchen

## 2017-04-13 ENCOUNTER — Encounter: Payer: Self-pay | Admitting: Adult Health

## 2017-04-13 ENCOUNTER — Ambulatory Visit (INDEPENDENT_AMBULATORY_CARE_PROVIDER_SITE_OTHER): Payer: Medicare Other | Admitting: Adult Health

## 2017-04-13 VITALS — BP 122/72 | Temp 97.6°F | Ht 61.0 in | Wt 122.0 lb

## 2017-04-13 DIAGNOSIS — E782 Mixed hyperlipidemia: Secondary | ICD-10-CM | POA: Diagnosis not present

## 2017-04-13 DIAGNOSIS — J449 Chronic obstructive pulmonary disease, unspecified: Secondary | ICD-10-CM

## 2017-04-13 DIAGNOSIS — F329 Major depressive disorder, single episode, unspecified: Secondary | ICD-10-CM

## 2017-04-13 DIAGNOSIS — E039 Hypothyroidism, unspecified: Secondary | ICD-10-CM

## 2017-04-13 DIAGNOSIS — E559 Vitamin D deficiency, unspecified: Secondary | ICD-10-CM | POA: Diagnosis not present

## 2017-04-13 DIAGNOSIS — F32A Depression, unspecified: Secondary | ICD-10-CM

## 2017-04-13 LAB — HEPATIC FUNCTION PANEL
ALBUMIN: 4.4 g/dL (ref 3.5–5.2)
ALK PHOS: 33 U/L — AB (ref 39–117)
ALT: 14 U/L (ref 0–35)
AST: 22 U/L (ref 0–37)
Bilirubin, Direct: 0.1 mg/dL (ref 0.0–0.3)
TOTAL PROTEIN: 6.3 g/dL (ref 6.0–8.3)
Total Bilirubin: 0.4 mg/dL (ref 0.2–1.2)

## 2017-04-13 LAB — BASIC METABOLIC PANEL
BUN: 7 mg/dL (ref 6–23)
CHLORIDE: 93 meq/L — AB (ref 96–112)
CO2: 40 meq/L — AB (ref 19–32)
Calcium: 10 mg/dL (ref 8.4–10.5)
Creatinine, Ser: 0.62 mg/dL (ref 0.40–1.20)
GFR: 99.58 mL/min (ref >60.00)
GLUCOSE: 88 mg/dL (ref 70–99)
POTASSIUM: 4.7 meq/L (ref 3.5–5.1)
SODIUM: 141 meq/L (ref 135–145)

## 2017-04-13 LAB — CBC WITH DIFFERENTIAL/PLATELET
BASOS PCT: 0.4 % (ref 0.0–3.0)
Basophils Absolute: 0 10*3/uL (ref 0.0–0.1)
EOS PCT: 0.3 % (ref 0.0–5.0)
Eosinophils Absolute: 0 10*3/uL (ref 0.0–0.7)
HCT: 44 % (ref 36.0–46.0)
Hemoglobin: 14.1 g/dL (ref 12.0–15.0)
LYMPHS ABS: 1.5 10*3/uL (ref 0.7–4.0)
Lymphocytes Relative: 18.7 % (ref 12.0–46.0)
MCHC: 32 g/dL (ref 30.0–36.0)
MCV: 95.6 fl (ref 78.0–100.0)
MONOS PCT: 5.5 % (ref 3.0–12.0)
Monocytes Absolute: 0.5 10*3/uL (ref 0.1–1.0)
NEUTROS PCT: 75.1 % (ref 43.0–77.0)
Neutro Abs: 6.1 10*3/uL (ref 1.4–7.7)
Platelets: 213 10*3/uL (ref 150.0–400.0)
RBC: 4.6 Mil/uL (ref 3.87–5.11)
RDW: 14.6 % (ref 11.5–15.5)
WBC: 8.1 10*3/uL (ref 4.0–10.5)

## 2017-04-13 LAB — LIPID PANEL
CHOLESTEROL: 154 mg/dL (ref 0–200)
HDL: 64.8 mg/dL (ref >39.00)
LDL Cholesterol: 63 mg/dL (ref 0–99)
NONHDL: 89.03
Total CHOL/HDL Ratio: 2
Triglycerides: 129 mg/dL (ref 0.0–149.0)
VLDL: 25.8 mg/dL (ref 0.0–40.0)

## 2017-04-13 LAB — TSH: TSH: 0.71 u[IU]/mL (ref 0.35–4.50)

## 2017-04-13 LAB — VITAMIN D 25 HYDROXY (VIT D DEFICIENCY, FRACTURES): VITD: 102.18 ng/mL — AB (ref 30.00–100.00)

## 2017-04-13 NOTE — Progress Notes (Signed)
Subjective:    Patient ID: Heidi [name], female    DOB: 09-Jul-1941, 76 y.o.   MRN: [id number]  HPI  Patient presents for yearly follow up exam. She is a pleasant 76 year old female who  has a past medical history of Arthritis; Cerebrovascular disease; Chronic diastolic CHF (congestive heart failure) ([place]) (02/23/2016); COPD (chronic obstructive pulmonary disease) ([place]); Depression; Diverticulosis; Esophageal stricture; History of pneumonia; History of stroke; adenomatous colonic polyps; Hyperlipidemia; Hypothyroidism; Internal hemorrhoids; Osteoporosis; Pneumonia; Positive ANA (antinuclear antibody); Pulmonary nodule; Rectal ulcer; Shortness of breath; and 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([place]).  All immunizations and health maintenance protocols were reviewed with the patient and needed orders were placed. She is up to date on all vaccinations.  She takes Synthroid 75 mcg daily for hypothyroidism   She takes Remeron 30 mg for depression, appetite stimulant, and sleep aid - She feels though these issues are stable. She often naps throughout the day, which interferes with her night time sleep schedule.   She takes Fosamax for osteoporosis treatment   She takes Zocor 10 mg for hyperlipidemia   Appropriate screening laboratory values were ordered for the patient including screening of hyperlipidemia, renal function and hepatic function.  Medication reconciliation,  past medical history, social history, problem list and allergies were reviewed in detail with the patient  Goals were established with regard to weight loss, exercise, and  diet in compliance with medications.   End of life planning was discussed.She has an advanced directive and living will   She is up to date on her colonoscopy    Review of Systems  Constitutional: Negative.   HENT: Negative.   Eyes: Negative.   Respiratory: Positive for shortness of breath.   Cardiovascular: Negative.   Gastrointestinal: Negative.   Endocrine:  Negative.   Genitourinary: Negative.   Musculoskeletal: Positive for arthralgias and gait problem.  Skin: Negative.   Allergic/Immunologic: Negative.   Hematological: Negative.   Psychiatric/Behavioral: Negative.   All other systems reviewed and are negative.  Past Medical History:  Diagnosis Date  . Arthritis   . Cerebrovascular disease   . Chronic diastolic CHF (congestive heart failure) ([place]) 02/23/2016  . COPD (chronic obstructive pulmonary disease) ([place])   . Depression   . Diverticulosis   . Esophageal stricture   . History of pneumonia   . History of stroke    mild oncoming  . 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 Hyperlipidemia   . Hypothyroidism   . Internal hemorrhoids   . Osteoporosis   . Pneumonia    HX of  . Positive ANA (antinuclear antibody)   . Pulmonary nodule   . Rectal ulcer   . Shortness of breath    periodically with COPD flair up  . 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[facility])     Social History   Social History  . Marital status: Widowed    Spouse name: N/A  . Number of children: 3  . Years of education: N/A   Occupational History  . RETIRED Retired   Social History Main Topics  . Smoking status: Former Smoker    Packs/day: 2.50    Years: 50.00    Quit date: 11/01/2009  . Smokeless tobacco: Never Used  . Alcohol use No  . Drug use: No  . Sexual activity: Not Currently   Other Topics Concern  . Not on file   Social History Narrative   Patient has no recent travel. She is smoking 2 packs per  day and has smoked since age of 51. Has cats and dogs at home. No bird or mold exposure.       dtr with temporary     Past Surgical History:  Procedure Laterality Date  . BLADDER SURGERY     tack  . CATARACT EXTRACTION     bilateral  . COLONOSCOPY  06/22/2011   Procedure: COLONOSCOPY;  Surgeon: Lafayette Dragon, MD;  Location: [facility] ENDOSCOPY;  Service: Endoscopy;  Laterality: N/A;  . GASTRIC VARICES BANDING  06/22/2011   Procedure:  HEMORRHOID BANDING;  Surgeon: Lafayette Dragon, MD;  Location: [facility] ENDOSCOPY;  Service: Endoscopy;  Laterality: N/A;  . HEMICOLECTOMY  2012   emergent surgery [place] [facility]  . HIATAL HERNIA REPAIR     x 2  . TONSILLECTOMY    . TOTAL ABDOMINAL HYSTERECTOMY W/ BILATERAL SALPINGOOPHORECTOMY  1998    Family History  Problem Relation Age of Onset  . Colon cancer Mother   . COPD Father   . Heart attack Father   . Stroke Maternal Aunt   . Colon polyps Brother   . Liver disease Maternal Aunt   . Breast cancer Maternal Aunt   . Hypertension Son   . Hypothyroidism Daughter     Allergies  Allergen Reactions  . Morphine And Related Nausea And Vomiting  . Penicillins Hives    Has patient had a PCN reaction causing immediate rash, facial/tongue/throat swelling, SOB or lightheadedness with hypotension: No Has patient had a PCN reaction causing severe rash involving mucus membranes or skin necrosis: No Has patient had a PCN reaction that required hospitalization No Has patient had a PCN reaction occurring within the last 10 years: No If all of the above answers are "NO", then may proceed with Cephalosporin use.    Current Outpatient Prescriptions on File Prior to Visit  Medication Sig Dispense Refill  . albuterol (PROVENTIL HFA;VENTOLIN HFA) 108 (90 BASE) MCG/ACT inhaler Inhale 2 puffs into the lungs every 6 (six) hours as needed for wheezing or shortness of breath. 1 Inhaler 0  . alendronate (FOSAMAX) 70 MG tablet TAKE ONE TABLET BY MOUTH ONCE A WEEK. TAKE WITH A FULL GLASS OF WATER ON AN EMPTY STOMACH FIRST THING IN THE MORNING. 12 tablet 3  . budesonide-formoterol (SYMBICORT) 160-4.5 MCG/ACT inhaler Inhale 2 puffs into the lungs 2 (two) times daily. 1 Inhaler 6  . furosemide (LASIX) 20 MG tablet Take 1 tablet (20 mg total) by mouth daily as needed for fluid or edema. 30 tablet 0  . gabapentin (NEURONTIN) 300 MG capsule TAKE 1 CAPSULE BY MOUTH TWICE DAILY 60 capsule 4  . halobetasol  (ULTRAVATE) 0.05 % ointment Apply topically 2 (two) times daily.      Marland Kitchen ipratropium-albuterol (DUONEB) 0.5-2.5 (3) MG/3ML SOLN Take 3 mLs by nebulization 2 (two) times daily. 360 mL 6  . levothyroxine (SYNTHROID) 75 MCG tablet Take 1 tablet (75 mcg total) by mouth daily before breakfast. 30 tablet 5  . LORazepam (ATIVAN) 1 MG tablet TAKE 1/2 TO 1 (ONE-HALF TO ONE) TABLET BY MOUTH THREE TIMES DAILY AS NEEDED **DISCONTINUE XANAX** 90 tablet 2  . Melatonin 5 MG CAPS Take by mouth.    . methylPREDNISolone (MEDROL) 8 MG tablet Take 1 daily x 14 days then 1/2 tablet daily thereafter (Patient taking differently: Take 1/2 tablet every other day) 30 tablet 6  . mirtazapine (REMERON) 30 MG tablet Take 1 tablet (30 mg total) by mouth at bedtime. 30 tablet 5  . simvastatin (ZOCOR) 10  MG tablet Take 1 tablet (10 mg total) by mouth at bedtime. 90 tablet 3  . traMADol (ULTRAM) 50 MG tablet TAKE 1/2 (ONE-HALF) TABLET BY MOUTH EVERY 6 HOURS AS NEEDED 15 tablet 0  . Vitamin D, Ergocalciferol, (DRISDOL) 50000 units CAPS capsule Take 1 capsule (50,000 Units total) by mouth every 7 (seven) days. 12 capsule 3   No current facility-administered medications on file prior to visit.     BP 122/72 (BP Location: Left Arm)   Temp 97.6 F (36.4 C) (Oral)   Ht 5\' 1"  (1.549 m)   Wt 122 lb (55.3 kg)   BMI 23.05 kg/m       Objective:   Physical Exam  Constitutional: She is oriented to person, place, and time. Vital signs are normal. She appears well-developed and well-nourished. No distress.  Thin    Neck: Normal range of motion. Neck supple. No thyromegaly present.  Cardiovascular: Normal rate, regular rhythm, normal heart sounds and intact distal pulses.  Exam reveals no gallop and no friction rub.   No murmur heard. Pulmonary/Chest: Effort normal and breath sounds normal. No respiratory distress. She has no wheezes. She has no rales. She exhibits no tenderness.  2 L via [place]    Abdominal: Soft. Bowel sounds are  normal. She exhibits no distension and no mass. There is no tenderness. There is no rebound and no guarding.  Neurological: She is alert and oriented to person, place, and time. She has normal strength. She displays normal reflexes. No cranial nerve deficit or sensory deficit. She exhibits normal muscle tone. Coordination normal.  Skin: Skin is warm and dry. No rash noted. She is not diaphoretic. No erythema. No pallor.  Psychiatric: She has a normal mood and affect. Her behavior is normal. Judgment and thought content normal.  Nursing note and vitals reviewed.     Assessment & Plan:  1. Hypothyroidism, unspecified type - She has been non compliant in the the past. Today in the office she reports taking medication daily. Consider dose adjustment if TSH is abnormal  - Basic metabolic panel - CBC with Differential/Platelet - TSH - Hepatic function panel - Lipid panel  2. COPD mixed type ([place]) - Continue follow up with Dr. Leeanne Deed   3. Depression, unspecified depression type - Stable. Continue Remeron  - PHQ 9 score of 7   4. Mixed hyperlipidemia - Continue with statin  - Basic metabolic panel - CBC with Differential/Platelet - TSH - Hepatic function panel - Lipid panel  5. Vitamin D deficiency  - Vitamin D, 25-hydroxy   Dorothyann Peng, NP

## 2017-04-13 NOTE — Patient Instructions (Signed)
It is always great seeing you!   I will follow up with you about your work   I want you to start transitioning off Tramadol to over the counterr pain medication   Please follow up as needed

## 2017-05-02 ENCOUNTER — Telehealth: Payer: Self-pay | Admitting: Pulmonary Disease

## 2017-05-02 MED ORDER — AZITHROMYCIN 250 MG PO TABS: 250.0000 mg | ORAL_TABLET | ORAL | 0 refills | Status: DC

## 2017-05-02 NOTE — Telephone Encounter (Signed)
Zpak, f/u by weekend if needed

## 2017-05-02 NOTE — Telephone Encounter (Signed)
Called spoke with [name] who reports pt has been having prod cough with yellow mucus onset today, has been taking Mucinex.    Denies any increased SOB, wheezing, tightness in chest, hemoptysis, f/c/s, chest pain, head congestion/runny nose/PND.    [name] is requesting an abx to ensure pt's symptoms do not worsen [facility] [address] Allergies  Allergen Reactions  . Morphine And Related Nausea And Vomiting  . Penicillins Hives    Has patient had a PCN reaction causing immediate rash, facial/tongue/throat swelling, SOB or lightheadedness with hypotension: No Has patient had a PCN reaction causing severe rash involving mucus membranes or skin necrosis: No Has patient had a PCN reaction that required hospitalization No Has patient had a PCN reaction occurring within the last 10 years: No If all of the above answers are "NO", then may proceed with Cephalosporin use.   SN is not in the office this week, will route to DOD for recommendations Dr Melvyn Novas please advise, thank you

## 2017-05-02 NOTE — Telephone Encounter (Signed)
Spoke with Heidi [name] and notified of recs per MW  She verbalized understanding  Rx was sent to pharm  She will call if not improving

## 2017-05-11 ENCOUNTER — Telehealth: Payer: Self-pay | Admitting: Pulmonary Disease

## 2017-05-11 NOTE — Telephone Encounter (Signed)
Per SN- no further abx is needed She can take delsym for the cough or the tramadol that she already has and use cough drops to help calm the cough  Spoke with [name] and notified of this and she verbalized understanding

## 2017-05-11 NOTE — Telephone Encounter (Signed)
Spoke with [name]  She is calling b/c pt finished the zpack we called in a few days ago and she is still coughing  She is producing some clear sputum, when it had been yellow before zpack  No other symptoms  She is taking mucinex d bid  [name] wondering if she needs another abx  Too late in the day to give appt  Please advise thanks Allergies  Allergen Reactions  . Morphine And Related Nausea And Vomiting  . Penicillins Hives    Has patient had a PCN reaction causing immediate rash, facial/tongue/throat swelling, SOB or lightheadedness with hypotension: No Has patient had a PCN reaction causing severe rash involving mucus membranes or skin necrosis: No Has patient had a PCN reaction that required hospitalization No Has patient had a PCN reaction occurring within the last 10 years: No If all of the above answers are "NO", then may proceed with Cephalosporin use.

## 2017-05-29 ENCOUNTER — Encounter: Payer: Self-pay | Admitting: Pulmonary Disease

## 2017-05-29 ENCOUNTER — Ambulatory Visit (INDEPENDENT_AMBULATORY_CARE_PROVIDER_SITE_OTHER): Admitting: Pulmonary Disease

## 2017-05-29 VITALS — BP 113/72 | HR 92 | Ht 64.0 in | Wt 118.4 lb

## 2017-05-29 DIAGNOSIS — I5032 Chronic diastolic (congestive) heart failure: Secondary | ICD-10-CM

## 2017-05-29 DIAGNOSIS — I679 Cerebrovascular disease, unspecified: Secondary | ICD-10-CM

## 2017-05-29 DIAGNOSIS — F411 Generalized anxiety disorder: Secondary | ICD-10-CM | POA: Diagnosis not present

## 2017-05-29 DIAGNOSIS — R9389 Abnormal findings on diagnostic imaging of other specified body structures: Secondary | ICD-10-CM | POA: Diagnosis not present

## 2017-05-29 DIAGNOSIS — J9611 Chronic respiratory failure with hypoxia: Secondary | ICD-10-CM | POA: Diagnosis not present

## 2017-05-29 DIAGNOSIS — J449 Chronic obstructive pulmonary disease, unspecified: Secondary | ICD-10-CM | POA: Diagnosis not present

## 2017-05-29 MED ORDER — METHYLPREDNISOLONE 8 MG PO TABS: ORAL_TABLET | ORAL | 6 refills | Status: DC

## 2017-05-29 NOTE — Patient Instructions (Addendum)
Today we updated your med list in our EPIC system...    Continue your current medications the same...  Continue the DUONEB via Nebulizer twice daily at breakfast & dinner...  Continue the SYMBICORT 2 sprays twice daily at [facility] & bedtime...  Continue the Medrol 4mg  every other day as we discussed...  Call for any questions...  Let's plan a follow up visit in 87mo, sooner if needed for problems.Marland KitchenMarland Kitchen

## 2017-06-04 ENCOUNTER — Ambulatory Visit: Payer: Medicare Other | Admitting: Pulmonary Disease

## 2017-06-04 ENCOUNTER — Encounter: Payer: Self-pay | Admitting: Pulmonary Disease

## 2017-06-04 NOTE — Progress Notes (Signed)
Subjective:     Patient ID: Heidi [name], female   DOB: [birth year], 76 y.o.   MRN: [id number]  HPI 76 y/o WF former smoker who quit 2016 w/ severe COPD/emphysema & chronic hypoxemic and hypercarbic resp failure, placed on Hospice by DrSwords/ DrBurchette in 2016, hx pneumonia and small bilat pulm nodules on CT Chest, and diastolic CHF...   ~  May 13, 2015:  Initial pulmonary consult by SN>        Heidi [name] was Heidi [name] 10/3 - 04/27/15 by [facility] after presenting via EMS w/ cough, weakness, increased SOB; she was still smoking, not on home O2; found to have COPD exac & CAP w/ bibasilar opacities, acute on chronic hypoxemic & hypercarbic resp failure, & superimposed CHF w/ bilat L>R pleural effusions (this was tapped 10/9 yielding transudative fluid, neg cytology, neg culture)... She was seen by Chuichu attended her in the hosp; Treated w/ Rocephin/ Zithromax, Solumedrol, Nebs, & brief stay in step-down unit on BiPap; disch on ADVAIR100-1spBid, SPIRIVA daily, PRED taper, NEBS w/ Albut... She was also treated for acute on chronic diastolic CHF- mod dil RV & reduced RV sys function (w/ preserved LVF) on 2DEcho & diuresed w/ Lasix but only disch on 38m prn for edema... Hospice was consulted (DrGolding's note reviewed) and she has entered the Hospice program (she is a DNR, DNI) w/ a prognosis est <681mo she was disch on ROXANOL 2012ml--take 1/2 ml Q2H as needed for pain, SOB, anxiety...  She last saw DrSwords 11/2013, she has established new PCP- CorDorothyann PengDrBurchette- has post hosp f/u pending; Note> she cancelled 2 appts w/ DrMcQuaid recently...       Currently Heidi [name] is very stoic- states she's not having any breathing problems at the present time & credits the thoracentesis w/ her remarkable improvement; her last cigarette was 04/19/15 prior to her admission; she was disch on O2 at 4L/min & has an O2sat=89% on this in the office today (RA sat was 74%); she was unable to perform PFT today; we had  a frank discussion about her severe, end-stage COPD...       Epic records indicate that she was seen by DrWert in 2011> on Spiriva +/- Advair, stated she quit smoking 2011 on adm for COPD exac, PFT reported FEV1=1.32 (77%), FEV1/FVC ratio=50%, DLCO=70%; CXR showed COPD & biapical pleuroparenchymal scarring, CT Chest 4/11 showed ?mult pulm nodules ?early cavitation r/o MAI, cultures- only NTF, O2sat=95% on RA...  Medical Hx>   She had an annual wellness exam from her PCP 02/2015> COPD, Hx pulm nodule, Hx of pneumonia; Hypothyroidism; adenomatous colonic polyps; Hyperlipidemia; Sigmoid volvulus; Depression; Cerebrovascular disease; Positive ANA; Diverticulosis; Esophageal stricture; Arthritis; History of stroke; Internal hemorrhoids; and Rectal ulcer; protein-calorie malnutrition (she weighs 78 lbs...  EXAM shows Afeb, VSS, O2sat=89% on O2 at 4L/min; Wt=78#, 5'2"Tall, BMI=16;  Heent- neg, mallampati1;  Chest- decr BS bilat, scat rhonchi, & end-exp wheezing;  Heart- RR gr1/6 SEM w/o r/g;  Abd soft, neg;  Ext- w/o c/c/ tr edema...   Baseline CXR 12/31/14 showed hyperinflation c/w COPD, biapical pleuroparenchymal scarring, nipple shadows and T12 compression fx...  CT Chest 02/22/15 showed cardiomeg, atherosclerotic changes in Ao & coronaries, small right effusion; emhysema w/ several additional findings- RUL & lingular subpleural opacities, & 5mm14mL & RLL nodules; osteopenia and T7 +T12 compression fractures...   CXRs performed during the 04/2015 Admission showed cardiomeg, CHF w/ left effusion, COPD w/ interstitial edema & LLL opac => improved post thoracentesis  10/9 w/ 400cc removed...  ABGs during the 10/16 Hosp showed pCO2 in the 73-87 range w/ resp acidosis...   LABS 10/16 reviewed> CBC- ok w/ Hg=13 range;  Chems- note HCO3=44-45 range & she may benefit drom Acetazolamide added to her diuretic regimen if more aggressive treatment is desired...  IMP>> Heidi [name] has severe COPD w/ acute on chronic hypoxemic &  hypercarbic resp failure & cor pulmonale; her last cigarette was the day of admission 04/19/15... She also has a hx of poor compliance w/ med rx & she has once again stopped the prescribed meds perceiving that she is much better since the thoracentesis; she was referred to Hospice for outpt follow up & will continue to see her PCPs at [facility]; I told her I would be happy to recheck at any time if she so desired...  NOTE> since her disch she states that her breathing has been "so much better" that she hasn't needed her inhalers or the nebulizer, and she's off the Pred; she states she is taking the ASA81, Lasix20, Synthroid137, Xanax, and Remeron; she apparently hasn't needed the Roxanol yet...  We had a frank discussion about her end-stage disease & we "proved it to her" by checking her O2sat off the oxygen (74%), and attempting to get a Spirometry tracing (she couldn't do it);  Pt is asked to get back on her meds & we outlined a regimen>  NEBS w/ Albut Qid (breakfast, lunch, dinner, & bedtime);  ADVAIR100- 2spBid (taken after the Neb at breakfast & dinner);  SPIRIVA via handihaler once daily (taken after the Neb at lunch)...   ~  January 10, 2016:  8mo ROV w/ SN>  NOTE: family has moved her into her own apt 12/2015) next to her Sister-in-law/ care giver's apt      Heidi [name] returns for a follow up visit- she has severe COPD/emphysema w/ chronic hypoxemic and hypercarbic resp failure; she was placed on [facility] by DrBurchette in Oct2016; she has been followed by NP-CoryNafziger since then;  She returns today w/ her sister-in-law care giver Nancy #[phone] indicating that Hospice is planning to stop services soon but pt is c/o severe LBP x 6wks ever since she lifted a chlorox bottle & felt a "pop" in her back; she has mentioned this to Hospice but all they have done is to adjust her pain meds (Percocet5-325 & Roxanol 20mg/ml- 0.5ml Q2H prn) without further eval;  She states that her breathing is  OK- meaning she could get about in her apt before the episode of acute LBP 2wks ago;I note that she has not had any lab work or f/u CXR since her disch from the Hosp 04/2015 & she is clearly in need of same- she has a hx of compression fx of spine and osteoporosis but not on any bone building therapy... We reviewed the following medical problems during today's office visit >>     Severe COPD> she could not perform simple spirometry when attempted 04/2015; on NEBS w/ Albut Q6H prn but it makes her too jittery, SYMBICORT160-2spBid & not using the SPIRIVA due to $$;  REC to use Albut 1/2vial in NEB Tid regularly followed by Symbicort160-2spBid & try INCRUSE once daily => we plan ROV in 6wks w/ repeat spirometry & oximetry if she can walk...     Chronic hypoxemic and hypercarbic resp failure> O2sat was 74% on RA 10/16 and ABGs in Hosp showed pCO2=73-87 w/ resp acidosis; she is now on O2 at 2.5L/min regularly &   looks much better, chest is clearer and labs improved; we will check ambulatory oximetry on ret if able to walk for [place]...    Hx pneumonia> resolved- CXR 12/2015 shows COPD & is clear, no opacity...    Hx bilat pulm nodules on CT Chest> Her CXR is much improved 01/10/16; she will be due for a f/u CT Chest on return 02/2016...    Ex-smoker, quit 04/2015>  congrats on not smoking!    Diastolic CHF w/ pulm edema & transudative left pleural effusion 04/2015> on Lasix20 prn only    Hx cerebrovasc dis & stroke> this must have occurred in [place]- no data in EPIC & no prev CDopplers to review; rec to take ASA81/d & we will check CDopplers later...    MEDICAL Issues>  Hyperlipidemia (not on meds),  Hypothyroidism (on Synthroid137/d),  Prot-cal malnutrition (wt is up 38# & BMI=21 now),      Hx HH repair & esoph stricture> not on PPI rx at present    Hx colon polyps, divertics, hems (w/ banding), & cecal volvulus s/p right hemicolectomy at [facility] 2012> aware...    S/P TAH & BSO> aware    DJD, Back pain w/  compression fxs> she indicates that she "cracked my spine" about 67yr ago, says she was hosp x4d w/ MRI, had brace applied- no shots or vertebroplasty ([facility] in [facility] from [facility]-2016;      Osteoporosis> she had BMD 01/2015 by PCP w/ TScore -4.8 in Lspine & -3.7 in Left FemNeck; placed on Ca++, VitD OTC, and FOSAMAX70/wk started; she picked up #4 tabs 02/11/15 but never refilled the med...    Hx anxiety/ depression> on XANAX 0.571mid prn and REMERON 1559mhs prn...    Hx poor compliance w/ medical regimen> this was before Hospice involvement in her care (04/2015) EXAM shows Afeb, VSS, O2sat=97% on O2 at 2.5L/min; Wt was 116# in [id number] 38# to BMI=21);  Heent- neg, mallampati1;  Chest- decr BS bilat w/o w/r/r;  Heart- RR gr1/6 SEM w/o r/g;  Abd soft, neg;  Ext- w/o c/c/e in wheelchair & won't stand/walk due to LBP...  CXR 01/10/16>  Much improved- some hyperinflation, bilat apical pleural thickening & bibasilar scarring, no effusion/ edema/ consolidation/ adenopathy, stable ant wedging of T12 w/ kyphosis...   Lumbar spine XRay 01/10/16>  Anterior wedging of L2 is new, old fx of T12, no other fxs & no spondylolithesis- disc sp narrowing L2-3 & L3-4, facet arthritis L5-S1, calcif in abd Ao...  LABS 12/2015>  Chems- wnl, HCO3=32, renal=wnl;  CBC- wnl;  TSH=0.01 on synthroid137;  BNP=70;  Sede=3 IMP/PLAN>>  Respiratory-wise she is improved, CXR is improved, etc but she needs to continue taking O2 and meds regularly- O2 at 2L/min by [place] 24/7, NEBS w/ Albut using 1/2 vial TID every day followed by SYMBICORT160- 2spBid and new INCRUSE one inhalation daily;  XRay of Lspine shows new compression in L2, and Epic review showed that she only took Alendronate70 for 4 wks in Aug2016- she never refilled the Rx;  Discussed CXR & Lumbar films w/ Sister-in-law care giver Nancy=> REC that Heidi [name] re-start Calcium/ VitD/ ALENDRONATE70/wk & stay on this; she has a back  brace at home & pain meds from Hospice, needs PT for ambulation;  Second issue is her oversuppressed TSH on Synth137=> REC decr dose to 112 & recheck TSH on return... ROV planned in 6wks time.  ~  February 23, 2016:  6wk ROV & f/u w/ SN>  Heidi [name] has done remarkably well from the pulm standpoint- stabilized and improved suffic to be able to move into her own appt & care for herself w/ family support nearby; she says she feels like she's getting better too; she was working w/ PT at home but she thinks they were working her too hard & she sent them away- I explained that this therapy is the MOST IMPORTANT thing for her continued recovery!  She tells me that this is the last month of her yr on Hospice, she has Roxanol on her med list but thank goodness she hasn't needed it!  She is in a wheelchair in the office today- but tells me she is up & about at home=> we will walk her today & check O2sats...    1) Severe COPD> she could not perform simple spirometry when attempted 04/2015; on NEBS w/ Albut Q6H prn & using 1/2 vial 2-3x per wk, SYMBICORT160-2spBid & Incruse once daily; Hx medication noncompliance & not using the NEBS Tid as requested!     2) Chronic hypoxemic and hypercarbic resp failure> O2sat was 74% on RA 10/16 and ABGs in Hosp showed pCO2=73-87 w/ resp acidosis; she is now on O2 at 2.5L/min regularly & looks much better, chest is clearer and labs improved; ambulatory oximetry 02/2016 showed no desaturation on 2.5L/min...     3) Hx pneumonia> resolved- CXR 12/2015 shows COPD & clear, no opacity...    4) Hx bilat pulm nodules on CT Chest> Her CXR is much improved 01/10/16; she will be due for a f/u CT Chest to check these nodules soon...    5) Ex-smoker, quit 04/2015>  congrats on not smoking!    6) Diastolic CHF w/ pulm edema & transudative left pleural effusion 04/2015> on Lasix20 prn only now    7) Hx cerebrovasc dis & stroke> this must have occurred in [place]- no data in EPIC & no prev CDopplers to  review; rec to take ASA81/d & she will need CDopplers from her PCP on ret...    8) MEDICAL Issues>  Hyperlipidemia (not on meds),  Hypothyroidism (on Synthroid112/d),  Prot-cal malnutrition (wt is up to 114# & BMI=20 now); I reviewed all her TSH readings and called her Pharm ([facility] in ) & they confirmed poor med compliance in past- eery time her dose was increased she was NOT taking her meds regularly!  She is now on 112mcg/d and pill counts are accurate- TSH=0.02 so this dose is still too high for her; WE WILL DECR DOSE TO 75mcg/d and turn over follow up of this problem to her PCP- DrCory & DrBurchette...    9) Hx HH repair & esoph stricture> not on PPI rx at present    10) Hx colon polyps, divertics, hems (w/ banding), & cecal volvulus s/p right hemicolectomy at [facility] 2012> aware...    11) S/P TAH & BSO> aware    12) DJD, Back pain w/ compression fxs> she indicates that she "cracked my spine" about 4yrs ago, says she was hosp x4d w/ MRI, had brace applied- no shots or vertebroplasty (this must have been done at [facility] as there are no XRay reports in Epic from 2012-2016; she was taking Roxanol for pain but will be leaving hospice soon & her PCP will assume care of her osteoporosis, back pain=> pain meds and bone building therapy.    13) Osteoporosis> she had BMD 01/2015 by PCP w/ TScore -4.8 in Lspine & -3.7 in   Left FemNeck; placed on Ca++, VitD OTC, and FOSAMAX70/wk started; she picked up #4 tabs 02/11/15 but never refilled the med...    14) Hx anxiety/ depression> on XANAX 0.5mgTid prn and REMERON 15mg Qhs prn...    15) Hx poor compliance w/ medical regimen> this was before Hospice involvement in her care (04/2015) EXAM shows Afeb, VSS, O2sat=97% on O2 at 1.5L/min; Wt = 114#, BMI=21;  Heent- neg, mallampati1;  Chest- decr BS bilat w/o w/r/r;  Heart- RR gr1/6 SEM w/o r/g;  Abd soft, neg;  Ext- w/o c/c/e in wheelchair today...  Ambulatory oximetry 02/23/16>  O2sat=94% on 1.5L/min at rest  w/ pulse=82/min;  She ambulated 3 Laps pushing her wheelchair w/ lowest O2sat=91% w/ HR=110/min...  LABS 02/23/16 showed TSH=0.02 on Levothy 112mcg/d taken regularly (confirmed by [facility] [place])... IMP/PLAN>>  Heidi [name] has stabilized from the cardiopulm standpoint & she is instructed to continue her current meds regularly Symbicort & Incruse) plus her NEBS, VentHFA, & Lasix20 as needed;  Looks like she needs CDoppler f/u, f/u FLP, management of her thyroid medication to insure compliance w/ Rx, and management of her osteoporosis/ bone building therapy & her back pain due to compression fxs => note sent to her PCP for all these...  ~  April 10, 2016:  6wk ROV w/ SN>  Heidi [name] returns for an add-on visit after she received visit here in [place] from family in [place] including grandkids w/ URI;  She was coughing up green mucus, called Hospice & they did a portable at home CXR that is said to be OK, clear, and treated her w/ Prednisone, "It was just a virus" she says, now improved... She is actually doing very well w/o acute resp symptoms and she has remained on her Pred10, NEBS w/ Albut- 1/2 vial Tid, Symbicort160-2Bid, Incruse once daily... She is c/o her port O2 system-- has difficulty filling the tanks, too heavy for her, she's "scared of it"=> needs POC (wants simply-Go mini) & we will contact [facility] to change out her system... She is also due for her follow up CT Chest to compare to 02/2015...    Since she was here last she had a f/u visit w/ PCP- [name] at [facility]> f/u depression- improved on Remeron for sleep, LBP- hospice XRays reported neg but showed L2 & T12 compressions- given Medrol dosepak & Salonpas patches... He has since reviewed my last OV note w/ requests for [facility] f/u for her medical issues- hypothyroid w/oversuppressed TSH on Synthroid (we cut her down to 75mcg/d), osteoporosis & compression fxs, due for f/u CDopplers...  SEE PROB LIST ABOVE>>     EXAM shows Afeb, VSS, O2sat=95% on O2  at ?3L/min; Wt = 120#, BMI=22;  Heent- neg, mallampati1;  Chest- decr BS bilat w/o w/r/r;  Heart- RR gr1/6 SEM w/o r/g;  Abd soft, neg;  Ext- w/o c/c/e;  Neuro- weak, no focal deficits...  LABS 04/10/16 per Cory> TSH=0.67 on Synthroid75- continue same;  VitD=16 & needs supplement w/ 50K weekly... IMP/PLAN>>  COPD/ chr resp failure stable on current regimen- continue same;  Thyroid is improved on the 75mcg/d taken regularly- continue same;  She needs VitD and bone building therapy=> start VitD50K per week and ALENDRONATE 70mg one per week- must get these filled regularly & take them weekly going forward without fail...  ~  May 11, 2016:  1mo ROV & add-on appt requested for dyspnea> Heidi [name] returns for an add-on visit "because I couldn't get thru to DrCory" c/o incr SOB, hard to cough   up any phlegm, denies f/c/s, c/o no appetite, shakey/ snappy/ agitated and incr SOB w/ activity & ADLs, feels like she can't get a deep breath, "anxious all the time"- says all this occurred when PRED weaned to 1/2 tab daily & she's been off x 2wks now; Hospice came out to her house & did a CXR- reported to be clear/ NAD (not avail for review, last CXR in Epic 01/10/16=> COPD, NAD, kyphosis & compression T12;...     EXAM shows Afeb, VSS, O2sat=96% on O2 at 3L/min; Wt = 124#, BMI=22;  HEENT- neg, mallampati1;  Chest- decr BS bilat w/o w/r/r;  Heart- RR gr1/6 SEM w/o r/g;  Abd soft, neg;  Ext- w/o c/c/e;  Neuro- weak, no focal deficits... IMP/PLAN>>  Heidi [name] has been using her NEBULIZER just prn & averaging once qod or so- we discussed a regular treatment regimen that includes using her NEB w/ 1/2 vial of Albut Tid regularly followed by her Symbicort160-2spBid & the Incruse once daily;  We will restart a systemic steroid med- try MEDROL 8mg tabs (one Bid for 10d then down to 1Qam til return);  In addition she will increase her Alprazolam to 1 tab Tid regularly (she's been on 1Bid but still anxious);  Keep other meds the same including  Alendronate70 once per week, Vit D 50K once per week, and Synthroid75 daily 1st thing in the Am;  We plan ROV recheck in 3wks time...  ~  June 05, 2016:  1mo ROV & add-on appt at request of Hospice nurse who said she had a "little rattle" in her chest- pt denies symptoms indicating chr stable SOB/DOE, no cough/ sput/ hemoptysis/ CP/ edema...  As noted Heidi [name] has severe COPD (on Medrol8, NEBS w/ 1/2 vial Albut Tid followed by Symbicort160-2spBid & Incruse once daily); chr hypoxemic & hypercarbic resp fail (on O2 at 2.5L/min); Abn Chest CT w/ bilat pulm nodules; Ex-smoker (quit 04/2015); Hx pneumonia, Hx diastolic CHF w/ pulm edema & transudative L effusion 04/2015 (on Lasix20)... Her dyspnea is multifactorial w/ all these problems in addition to anxiety & poor medication compliance...    EXAM shows Afeb, VSS, O2sat=94% on O2 at 2L/min; Wt = 130# up 7#;  HEENT- neg, mallampati1;  Chest- decr BS bilat w/ few velcro rales at bases;  Heart- RR gr1/6 SEM w/o r/g;  Abd soft, neg;  Ext- w/o c/c/e;  Neuro- weak. IMP/PLAN>>  Heidi [name] is concerned about the 7# wt gain on Medrol8 so we decided to decr this to 1/2 tab Qam; otherw continue same meds & we plan ROV in 3mo...  ~  June 19, 2016:  2wk ROV & add-on appt requested for head & chest congestion, cough w/ thick green mucus, and SOB- can't get a DB; pt denies hemoptysis/ f/c/s/ CP/ edema/ etc... As noted Heidi [name] has severe COPD (on Medrol8-1/2 daily (but she stopped it on her own), NEBS w/ 1/2 vial Albut Tid followed by Symbicort160-2spBid & Incruse once daily); chr hypoxemic & hypercarbic resp fail (on O2 at 2.5L/min); Abn Chest CT w/ bilat pulm nodules; Ex-smoker (quit 04/2015); Hx pneumonia, Hx diastolic CHF w/ pulm edema & transudative L effusion 04/2015 (on Lasix20)... Her dyspnea is multifactorial w/ all these problems in addition to anxiety (on Xanax0.5mg but not taking regularly) & poor medication compliance (eg- still only using NEB bid)... NOTE> she called PCP  but didn't get response, Hospice stopped ~1wk ago; she wants POC...    EXAM shows Afeb, VSS, O2sat=93% on O2 at 2L/min; Wt = 131#;    HEENT- neg, mallampati1;  Chest- decr BS bilat w/ few velcro rales at bases;  Heart- RR gr1/6 SEM w/o r/g;  Abd soft, neg;  Ext- w/o c/c/e;  Neuro- weak.  CXR 06/19/16 (independently reviewed by me in the PACS system) showed COPD & incr markings both bases c/w Atx- chr changes include biapical pleural thickening, cardiomeg, Ao atherosclerosis, modHH, T12 compression fx...   LABS 06/19/16>  Chems- ok w/ HCO3=31 otherw wnl;  CBC- wnl w/ wbc=8.4;  Sed=49;  BNP=19... IMP/PLAN>>  Once again Heidi [name] is her own worse enemy by stopping the Medrol on her own & decreasing the Nebs to Bid; we spent extra time reviewing the rationale behind the Med Rx including Medrol (rec to incr to 74m/d again), NEBS w/ albut (1/2 vial TID) followed by the Symbicort160-2spBid & Incruse once daily; asked to incr the Mucinex12020mBid w/ fluids, and take the Alprazolam more regularly eg- 1/2 tab tid,  +continue her O2 etc... She requests a POC so she can incr her mobility... We plan ROV in 43m74mor recheck... Note: >50% of this 73m38misit was spent in counseling and coordination of care...  ~  August 14, 2016:  43mo 66mo& pulmonary follow up visit>  Heidi Lisetrts a good interval & indicates that her breathing is at baseline; once again however she has decreased the NEBs to just 1-2/d & she tells me that sometimes she skips a day (she believes that this means she is doing better); she is using the Symbicort & Incruse regularly; she remains on the Medrol8mg t67m taking 1/2 tab daily; she remains on O2 at 2L/min flow but still has the port tanks and [facility]- she tells me that she knows that insurance will cover this because she has Medicare, [employer]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 We reviewed the following medical problems during today's office visit >>     1) Severe COPD> she could not perform simple spirometry when attempted 04/2015; on MEDROL8-1/2Qam, NEBS w/ Albut Tid & using 1/2 vial 1-2x/d, SYMBICORT160-2spBid & Incruse once daily; Hx medication noncompliance & not using the NEBS Tid as requested!     2) Chronic hypoxemic and hypercarbic resp failure> O2sat was 74% on RA 10/16 [place]73[place]87 w/ resp acidosis; she is now on O2 at 2.5L/min regularly & stable, chest is clearer and labs improved; ambulatory oximetry 02/2016 showed no desaturation on 2.5L/min...     Marland KitchenMarland Kitchen Hx pneumonia> resolved- last CXR 06/2016 shows COPD & clear, no opacity...    4Marland KitchenMarland KitchenHx bilat pulm nodules on CT Chest> Her CXRs are much improved; her last CT Chest was 02/2015 & she never got the planned 03/2016 f/u CT scan...    5) Ex-smoker, quit 04/2015>  congrats on not smoking!    6) Diastolic CHF w/ pulm edema & transudative left pleural effusion 04/2015> on Lasix20 prn for edema...    7Marland KitchenMarland KitchenHx cerebrovasc dis & stroke> this must have occurred in [place] in EPIC & no prev CDopplers to review; rec to take ASA81/d & DrCory never ordered the CDopplers...    8) MEDICAL Issues>  Hyperlipidemia (not on meds),  Hypothyroidism (on Synthroid75/d),  Hx Prot-cal malnutrition (wt is up to 132# & BMI=23 now); Note>  I reviewed all her TSH readings and called her Pharm [facility] in [place]) & they confirmed poor med compliance in past- every time her dose was increased she was NOT taking her meds regularly!  on 159mg/d and pill counts are accurate- TSH=0.02 so this dose is still too high for her; WE DECR DOSE TO 759m/d and turn over follow up of this problem to her PCP- DrCory & DrBurchette...    9) Hx HH repair & esoph stricture> not on PPI rx at present    10) Hx colon polyps, divertics, hems (w/  banding), & cecal volvulus [facility]> aware...    11) S/P TAH & BSO> aware    12) DJD, Back pain w/ compression fxs> she indicates that she "cracked my spine" about 4y59yrgo, says she was hosp x4d w/ MRI, had brace applied- no shots or vertebroplasty (this [facility] there are no XRay reports in Epic from 2012-2016; she was taking Roxanol for pain but will be leaving hospice soon & her PCP will assume care of her osteoporosis, back pain=> pain meds and bone building therapy.    13) Osteoporosis> she had BMD 01/2015 by PCP w/ TScore -4.8 in Lspine & -3.7 in Left FemNeck; placed on Ca++, VitD OTC, and FOSAMAX70/wk started=> but compliance is poor...    14) Hx anxiety/ depression> on XANAX 0.5mg82m prn and REMERON 15mg37m prn...    15) Hx poor compliance w/ medical regimen> this was before Hospice involvement in her care (04/2015)    EXAM shows Afeb, VSS, O2sat=93% on O2 at 2L/min; Wt = 131#;  HEENT- neg, mallampati1;  Chest- decr BS bilat w/ few velcro rales at bases;  Heart- RR gr1/6 SEM w/o r/g;  Abd soft, neg;  Ext- w/o c/c/e;  Neuro- weak. IMP/PLAN>>  We spent some time again reviewing the need/ rationale behind the Tid NEBS w/ 1/2 vial of Albut followed by the Symbicort Bid & Spiriva once daily; she has requested that we refill her Xanax0.5mgBi51mSynthroid75/d, and Remeron30mgQh37me will [facility] pt's use... we plan ROV in 3 months...  ~  November 13, 2016:  76mo ROV69mo/u of her COPD w/ chr hypoxemic & hypercarbic resp failure> Heidi [name] w/ c/o URI, cough w/ yellow mucus & blood streaks, on her O2 at 2L/min 24/7, Medrol8mg- 1/237mily, NEBS w/ Albut using 1/2 vial but only prn & ave 1-2/wk, Symbicort160-2spBid, Incruse one daily;  We called in Levaquin Ravenad now...    As noted she's been off Hospice since 05/2016 & no longer taking Roxanol & Percocet; she uses Neurontin & Aleve for pain;   Her PCP is "DrCorey" & last seen 07/2016...     Last labs 06/2016 reviewed in Epic> HCO3=31, Cr=0.57, BNP=19, CBC=wnl... EXAM shows Afeb, VSS, O2sat=96% on O2 at 2L/min; Wt = 130#;  HEENT- neg, mallampati1;  Chest- decr BS bilat w/ few velcro rales at bases;  Heart- RR gr1/6 SEM w/o r/g;  Abd soft, neg;  Ext- w/o c/c/e;  Neuro- weak. IMP/PLAN>>  We decided to slowly wean the Medrol8mg tabs=27mown to 1/2 tab Qod, continue other meds regularly as prescribed; stay as active as poss at home...   ~  February 28, 2017:  76mo ROV & 576moon appt requested after a fall at home>  Pt reports fall at home 02/22/17- she lost her balance letting her dog (Lulu) out & fell onto  the dog crete, hit her ant chest wall w/ bruise & pain, didn't hit head/ no LOC;  She went to see DrCorey- note reviewed, tender over sternum, CXR w/ concern for sternal fx, and subseq CT Chest 02/26/17=> atherosclerotic calcif in Ao & coronaries, no aneurysm, no adenopathy, mod HH, scarring in lung apicies & centrilob emphysema, mild atx at bases, fx of mid sternum w/ 54m post displacement of the distal fx fragment, mod ant wedging of L2 & L7... EXAM shows Afeb, VSS, O2sat=90% on O2 at 2L/min; Wt = 128#;  HEENT- neg, mallampati1;  Chest- tender over sternum, bruising, decr BS bilat w/ few velcro rales at bases;  Heart- RR gr1/6 SEM w/o r/g;  Abd soft, neg;  Ext- w/o c/c/e;  Neuro- weak. IMP/PLAN>>  We discussed rx w/ rest, heat, TRAMADOL50 + Tylenol500, exercise extreme caution-- no falling allowed!  ~  April 11, 2017:  6wk ROV & JKiondrais improved, sternal fx & CWP are better w/ conservative rx, [facility] [place] it worn her out but she did ok;  Chest is improved, less tender, min cough, no sput, no hemoptysis, and SOB/DOE is about the same she says;  She reports that she is supposed to recertify for her O2 at this time=> see below, she has a home concentrator and a POC for ambulation...   We reviewed the following  medical problems during today's office visit >>     1) Severe COPD> she could not perform simple spirometry when attempted 04/2015; on MEDROL8-1/2Qam, NEBS w/ Albut Tid but NOT using regularly, SYMBICORT160-2spBid but NOT using regularly & Incruse- not using at all; Hx medication noncompliance & not using the NEBS Tid as requested!     2) Chronic hypoxemic and hypercarbic resp failure> O2sat was 74% on RA 10/16 and ABGs in [place] pCO2=73-87 w/ resp acidosis; she is now on O2 at 2.5L/min regularly & stable, chest is clearer and labs improved; ambulatory oximetry 02/2016 showed no desaturation on 2.5L/min..Marland KitchenMarland Kitchen    3) Hx pneumonia> resolved- last CXR 8/18 shows sternal fx after fall, COPD/emphysema, HH & old compression deformities in spine    4) Hx bilat pulm nodules on CT Chest> Her CXRs were much improved; her last CT Chest was 8/18 & showed mid sternal fx after fall; centrilob emphysema; aortic, great vessel, & coronary atherosclerotic calcif; no adenopathy; mod HH; prior T7 T12 L2 compressions..Marland KitchenMarland Kitchen   5) Ex-smoker, quit 04/2015>  congrats on not smoking!    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    8) MEDICAL Issues>  Hyperlipidemia (not on meds),  Hypothyroidism (on Synthroid75/d),  Hx Prot-cal malnutrition (wt is 122# & BMI=20-21); Note> I reviewed all her TSH readings and called her Pharm (Engineer, building servicesin [place] & they confirmed poor med compliance in past- every time her dose was increased she was NOT taking her meds regularly!  on 1165m/d and pill counts are accurate- TSH=0.02 so this dose is still too high for her; WE DECR DOSE TO 7578md and turn over follow up of this problem to her PCP- DrCory & DrBurchette...    9) Hx HH repair & esoph stricture> not on  PPI rx at present, modHH seen  on CT Chest 8/18...    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    11) S/P TAH & BSO> aware    12) DJD, Back pain w/ compression fxs> she indicates that she "cracked my spine" about 48yr ago, says she was hosp x4d w/ MRI, had brace applied- no shots or vertebroplasty ([facility] are [facility] reports in Epic from 2012-2016; she was taking Roxanol for pain but will be leaving hospice soon & her PCP will assume care of her osteoporosis, back pain=> pain meds and bone building therapy.    13) Osteoporosis> she had BMD 01/2015 by PCP w/ TScore -4.8 in Lspine & -3.7 in Left FemNeck; placed on Ca++, VitD OTC, and FOSAMAX70/wk started=> but compliance is poor...    14) Hx anxiety/ depression> on XANAX 0.'5mg'$ Tid prn and REMERON '15mg'$  Qhs prn...    15) Hx poor compliance w/ medical regimen> this was before Hospice involvement in her care (04/2015) EXAM shows Afeb, VSS, O2sat=92% on O2 at 2L/min; Wt = 122#;  HEENT- neg, mallampati1;  Chest- sl tender over sternum, bruising, decr BS bilat w/ few velcro rales at bases;  Heart- RR gr1/6 SEM w/o r/g;  Abd soft, neg;  Ext- w/o c/c/e;  Neuro- weak.  Ambulatory Oximetry 04/11/17>  O2sat on RA at rest is 85% and it dropped to 83% upon arising & walking to the door of the exam room => she therefore re-qualifies for HOME O2 at 2L/min at rest & Qhs + 2-3L/min w/ exercise. IMP/PLAN>>  Compliance with the prescribed medications has always been Carrisa's weakest attribute-- reminded to do her meds EXACTLY as prescribed for max benefit and TO PROTECT HER HEART;  We have outlined a plan using NEBULIZER w/ Duoneb Bid (at breakfast & dinner), Symbicort160- 2sp Bid (at lunch & bedtime), O2 24/7 using 2L/min at rest & up to 3L/min w/ exercise, + Medrol'8mg'$ - 1/2 tab every other day... She has a DRetail buyerfor for [place] complete- done;  Given 2018 FLU  vaccine today...   ~  May 29, 2017:  6wk ROV & JMadasonreports that she is doing "OK"; she called 05/02/17 c/o productive cough w/ yellow sput, ZPak was called in & pt improved along w/ Tramadol & Mucinex;  She states that she's been regular w/ the DUONEB Bid (at breakfast & dinner) + the Symbicort160- 2spBid (at lunch & bedtime), plus the O2 regularly 7 the Medrol'8mg'$ - 1/2 Qod;  This is encouraging to know that she is using the eds asprescribed & on a regular schedule... Presently she notes min cough, decr sput, no hemoptysis, chr stable SOB/DOE w/o wheezing etc... We reviewed the above problem list together...    EXAM shows Afeb, VSS, O2sat=97% on O2 at 2L/min; Wt = 122#;  HEENT- neg, mallampati1;  Chest- sl tender over sternum, bruising, decr BS bilat w/ few velcro rales at bases;  Heart- RR gr1/6 SEM w/o r/g;  Abd soft, neg;  Ext- w/o c/c/e;  Neuro- weak. IMP/PLAN>>  Heidi [name] stable on this regular regimen w/ better medication compliance- continue same thru the holidays and we will plan ROV recheck in 235mo/ labs, poss ABGs, poss spirometry, etc...     Past Medical History:  Diagnosis Date  . Arthritis   . Cerebrovascular disease   . Chronic diastolic CHF (congestive heart failure) (HCEdna8/03/2016  . COPD (chronic obstructive pulmonary disease) (HCPringle  .  Depression   . Diverticulosis   . Esophageal stricture   . Generalized anxiety disorder   . History of pneumonia   . History of stroke    mild oncoming  . 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 Hyperlipidemia   . Hypothyroidism   . Internal hemorrhoids   . Osteoporosis   . Pneumonia    HX of  . Positive ANA (antinuclear antibody)   . Pulmonary nodule   . Rectal ulcer   . Shortness of breath    periodically with COPD flair up  . 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[name])     Past Surgical History:  Procedure Laterality Date  . BLADDER SURGERY     tack  . CATARACT EXTRACTION     bilateral  . COLONOSCOPY N/A  06/22/2011   Performed by Lafayette Dragon, MD at [place]  . HEMICOLECTOMY  2012   emergent surgery [place] [name]  . HEMORRHOID BANDING N/A 06/22/2011   Performed by Lafayette Dragon, MD at [place]  . HIATAL HERNIA REPAIR     x 2  . TONSILLECTOMY    . TOTAL ABDOMINAL HYSTERECTOMY W/ BILATERAL SALPINGOOPHORECTOMY  1998    Outpatient Encounter Medications as of 05/29/2017  Medication Sig  . acetaminophen (TYLENOL) 500 MG tablet Take 500 mg by mouth every 6 (six) hours as needed.  Marland Kitchen albuterol (PROVENTIL HFA;VENTOLIN HFA) 108 (90 BASE) MCG/ACT inhaler Inhale 2 puffs into the lungs every 6 (six) hours as needed for wheezing or shortness of breath.  Marland Kitchen alendronate (FOSAMAX) 70 MG tablet TAKE ONE TABLET BY MOUTH ONCE A WEEK. TAKE WITH A FULL GLASS OF WATER ON AN EMPTY STOMACH FIRST THING IN THE MORNING.  Marland Kitchen azithromycin (ZITHROMAX) 250 MG tablet Take 1 tablet (250 mg total) by mouth as directed.  . budesonide-formoterol (SYMBICORT) 160-4.5 MCG/ACT inhaler Inhale 2 puffs into the lungs 2 (two) times daily.  . furosemide (LASIX) 20 MG tablet Take 1 tablet (20 mg total) by mouth daily as needed for fluid or edema.  . gabapentin (NEURONTIN) 300 MG capsule TAKE 1 CAPSULE BY MOUTH TWICE DAILY  . halobetasol (ULTRAVATE) 0.05 % ointment Apply topically 2 (two) times daily.    Marland Kitchen ipratropium-albuterol (DUONEB) 0.5-2.5 (3) MG/3ML SOLN Take 3 mLs by nebulization 2 (two) times daily.  Marland Kitchen levothyroxine (SYNTHROID) 75 MCG tablet Take 1 tablet (75 mcg total) by mouth daily before breakfast.  . LORazepam (ATIVAN) 1 MG tablet TAKE 1/2 TO 1 (ONE-HALF TO ONE) TABLET BY MOUTH THREE TIMES DAILY AS NEEDED **DISCONTINUE XANAX**  . Melatonin 5 MG CAPS Take by mouth.  . methylPREDNISolone (MEDROL) 8 MG tablet Take 1/2 tablet every other day  . mirtazapine (REMERON) 30 MG tablet Take 1 tablet (30 mg total) by mouth at bedtime.  . simvastatin (ZOCOR) 10 MG tablet Take 1 tablet (10 mg total) by mouth at bedtime.  .  traMADol (ULTRAM) 50 MG tablet TAKE 1/2 (ONE-HALF) TABLET BY MOUTH EVERY 6 HOURS AS NEEDED  . [DISCONTINUED] methylPREDNISolone (MEDROL) 8 MG tablet Take 1 daily x 14 days then 1/2 tablet daily thereafter (Patient taking differently: Take 1/2 tablet every other day)  . [DISCONTINUED] Vitamin D, Ergocalciferol, (DRISDOL) 50000 units CAPS capsule Take 1 capsule (50,000 Units total) by mouth every 7 (seven) days.   No facility-administered encounter medications on file as of 05/29/2017.     Allergies  Allergen Reactions  . Morphine And Related Nausea And Vomiting  . Penicillins Hives  Has patient had a PCN reaction causing immediate rash, facial/tongue/throat swelling, SOB or lightheadedness with hypotension: No Has patient had a PCN reaction causing severe rash involving mucus membranes or skin necrosis: No Has patient had a PCN reaction that required hospitalization No Has patient had a PCN reaction occurring within the last 10 years: No If all of the above answers are "NO", then may proceed with Cephalosporin use.    Immunization History  Administered Date(s) Administered  . H1N1 06/30/2008  . Influenza Split 04/18/2012  . Influenza Whole 06/10/2007, 04/14/2010, 04/18/2012  . Influenza, High Dose Seasonal PF 06/02/2015, 04/10/2016, 04/11/2017  . Influenza,inj,Quad PF,6+ Mos 04/16/2013  . Pneumococcal Conjugate-13 06/02/2015  . Pneumococcal Polysaccharide-23 05/17/2006, 04/16/2013  . Td 03/29/1999  . Tdap 04/16/2013  . Zoster 07/17/2013    Current Medications, Allergies, Past Medical History, Past Surgical History, Family History, and Social History were reviewed in [employer] record.   Review of Systems             All symptoms NEG except where BOLDED >>  Constitutional:  F/C/S, fatigue, anorexia, unexpected weight change. HEENT:  HA, visual changes, hearing loss, earache, nasal symptoms, sore throat, mouth sores, hoarseness. Resp:  cough, sputum,  hemoptysis; SOB, tightness, wheezing. Cardio:  CP, palpit, DOE, orthopnea, edema. GI:  N/V/D/C, blood in stool; reflux, abd pain, distention, gas. GU:  dysuria, freq, urgency, hematuria, flank pain, voiding difficulty. MS:  joint pain, swelling, tenderness, decr ROM; neck pain, back pain, etc. Neuro:  HA, tremors, seizures, dizziness, syncope, weakness, numbness, gait abn. Skin:  suspicious lesions or skin rash. Heme:  adenopathy, bruising, bleeding. Psyche:  confusion, agitation, sleep disturbance, hallucinations, anxiety, depression suicidal.   Objective:   Physical Exam       Vital Signs:  Reviewed...   General:  WD, thin, 76 y/o WM in NAD; alert & oriented; pleasant & cooperative... HEENT:  [place]/AT; Conjunctiva- pink, Sclera- nonicteric, EOM-wnl, PERRLA, EACs-clear, TMs-wnl; NOSE-clear; THROAT-clear & wnl.  Neck:  Supple w/ fair ROM; no JVD; normal carotid impulses w/o bruits; no thyromegaly or nodules palpated; no lymphadenopathy.  Chest:  Decr BS bilat, clear w/o w/r/r heard... Heart:  Regular Rhythm; ?S4, gr1/6 SEM Abdomen:  Soft & nontender- no guarding or rebound; normal bowel sounds; no organomegaly or masses palpated. Ext:  decr ROM; without deformities +arthritic changes; no varicose veins, venous insuffic, no edema;  Pulses intact w/o bruits. Neuro:  No focal neuro deficits- pt in wheelchair & won't stand or walk due to LBP... Derm:  No lesions noted; no rash etc. Lymph:  No cervical, supraclavicular, axillary, or inguinal adenopathy palpated.   Assessment:      IMP>>     Severe COPD> she could not perform simple spirometry when attempted 04/2015; on NEBS w/ Albut Q6H prn but it makes her too jittery, SYMBICORT160-2spBid & not using the [facility] due to $$;  REC to use Albut 1/2vial in NEB Tid regularly followed by Symbicort160-2spBid & try INCRUSE once daily => stay on these regularly...    Chronic hypoxemic and hypercarbic resp failure> O2sat was 74% on RA 10/16 and ABGs in  [place] showed pCO2=73-87 w/ resp acidosis; she is now on O2 at 2.5L/min regularly & looks much better, chest is clearer and labs improved;  Ambulatory oximetry 02/2016 w/ sats in the 90s on 1.5L after 3 Laps- much improved...    Hx pneumonia> resolved- CXR 12/2015 shows COPD & is clear, no opacity...    Hx bilat pulm nodules on CT  Chest> Her CXR is much improved 01/10/16; she is due for f/u CT Chest but can't get this til she is OFF Hospice!    Ex-smoker, quit 04/2015>  congrats on not smoking!    Diastolic CHF w/ pulm edema & transudative left pleural effusion 04/2015> on Lasix20 prn only    Hx cerebrovasc dis & stroke> this must have occurred in [place]- no data in EPIC & no prev CDopplers to review; rec to take ASA81/d & we will check CDopplers later...    MEDICAL Issues>  Hyperlipidemia (not on meds),  Hypothyroidism (on Synthroid137- oversuppressed w/ TSH=0.01 therefore decr dose to 128mg/d & recheck),  Prot-cal malnutrition (wt is up 38# & BMI=21 now),      Hx HH repair & esoph stricture> not on PPI rx at present    Hx colon polyps, divertics, hems (w/ banding), & [facility] aware...    S/P TAH & BSO> aware    DJD, Back pain w/ compression fxs> she indicates that she "cracked my spine" about 437yrago, says she was hosp x4d w/ MRI, had brace applied- no shots or vertebroplasty (this [facility] there are no XRay reports in Epic from 2012-2016;      Osteoporosis> she had BMD 01/2015 by PCP w/ TScore -4.8 in Lspine & -3.7 in Left FemNeck; placed on Ca++, VitD OTC, and FOSAMAX70/wk started; she picked up #4 tabs 02/11/15 but never refilled the med; WE STARTED VITD 50K per week & ALENDRONATE '70mg'$ /wk (Sep2017).    Hx anxiety/ depression> on XANAX 0.'5mg'$ Tid prn and REMERON '15mg'$  Qhs prn...    Hx poor compliance w/ medical regimen> this was before Hospice involvement in her care (04/2015)  PLAN>>  01/10/16>   Respiratory-wise she is  improved, CXR is improved, etc but she needs to continue taking O2 and meds regularly- O2 at 2L/min by [place] 24/7, NEBS w/ Albut using 1/2 vial TID every day followed by SYMBICORT160- 2spBid and new INCRUSE one inhalation daily;  XRay of Lspine shows new compression in L2, and Epic review showed that she only took Alendronate70 for 4 wks in Aug2016- she never refilled the Rx;  Discussed CXR & Lumbar films w/ Sister-in-law care giver Nancy=> REC that [place] re-start Calcium/ VitD/ ALENDRONATE70/wk & stay on this (she never did); she has a back brace at home & pain meds from Hospice, needs PT for ambulation;  We decreased her Synthroid to 11284md as well... ROV planned in 6wks time. 02/23/16>   JudLititias stabilized from the cardiopulm standpoint & she is instructed to continue her current meds regularly (Symbicort & Incruse) plus her NEBS, VentHFA, & Lasix20 as needed;  Looks like she needs CDoppler f/u, f/u FLP, management of her thyroid medication to insure compliance w/ Rx (we cut her to 65m78moday), and management of her osteoporosis/ bone building therapy & her back pain due to compression fxs => note sent to her PCP... 04/10/16>   Heidi [name] to continue her O2, Pred10, NEBS, Symbicort, Incruse regularly;  In addition she needs to stay on the Synthroid65mc65mdose regularly;  And finally needs VitD50K & Alendronate70 both weekly for her bones... 05/11/16>   Heidi [name] Kelcibeen using her NEBULIZER just prn & averaging once qod or so- we discussed a regular treatment regimen that includes using her NEB w/ 1/2 vial of Albut Tid regularly followed by her Symbicort160-2spBid & the Incruse once daily;  We will restart a systemic steroid  med- try MEDROL '8mg'$  tabs (one Bid for 10d then down to 1Qam til return);  In addition she will increase her Alprazolam to 1 tab Tid regularly (she's been on 1Bid but still anxious);  Keep other meds the same including Alendronate70 once per week, Vit D 50K once per week, and Synthroid75 daily 1st  thing in the [place];  We plan ROV recheck in 3wks time... 06/05/16>   Add on appt per Hospice nurse who heard rales at bases, pt is not acutely symptomatic- but is concerned about 7# wt gain on Medrol8 so we decided to decr this to 1/2 tab Qam; otherw continue same meds & we plan ROV in 56mo.. 06/19/16>   Once again JSharonicais her own worse enemy by stopping the Medrol on her own & decreasing the Nebs to Bid; we spent extra time reviewing the rationale behind the Med Rx including Medrol (rec to incr to '8mg'$ /d again), NEBS w/ albut (1/2 vial TID) followed by the Symbicort160-2spBid & Incruse once daily; asked to incr the Mucinex'1200mg'$  Bid w/ fluids, and incr the Alprazolam 1/2 tab tid, +continue her O2 etc... She requests a POC so she can incr her mobility... 08/14/16>   We spent some time again reviewing the need/ rationale behind the Tid NEBS w/ 1/2 vial of Albut followed by the Symbicort Bid & Spiriva once daily; she has requested that we refill her Xanax0.'5mg'$ Bid, Synthroid75/d, and Remeron'30mg'$ Qhs; [facility]... we plan ROV in 3 months... 11/13/16>    We decided to slowly wean the Medrol'8mg'$  tabs=> down to 1/2 tab Qod, continue other meds regularly as prescribed; stay as active as poss at home...  02/28/17>   We discussed rx w/ rest, heat, TRAMADOL50 + Tylenol500, exercise extreme caution-- no falling allowed! 04/11/17>   Compliance with the prescribed medications has always been Copeland's weakest attribute-- reminded to do her meds EXACTLY as prescribed for max benefit and TO PROTECT HER HEART;  We have outlined a plan using NEBULIZER w/ Duoneb Bid (at breakfast & dinner), Symbicort160- 2sp Bid (at lunch & bedtime), O2 24/7 using 2L/min at rest & up to 3L/min w/ exercise, + Medrol'8mg'$ - 1/2 tab every other day... She has a DRetail buyerfor for [place] complete- done;  Given 2018 FLU vaccine today 05/29/17>   JEmaliis stable on this regular regimen w/ better medication compliance-  continue same thru the holidays and we will plan ROV recheck in 226mo/ labs, poss ABGs, poss spirometry, etc.   Plan:       Medication List        Accurate as of 05/29/17 11:59 PM. Always use your most recent med list.          acetaminophen 500 MG tablet Commonly known as:  TYLENOL   albuterol 108 (90 Base) MCG/ACT inhaler Commonly known as:  PROVENTIL HFA;VENTOLIN HFA Inhale 2 puffs into the lungs every 6 (six) hours as needed for wheezing or shortness of breath.   alendronate 70 MG tablet Commonly known as:  FOSAMAX TAKE ONE TABLET BY MOUTH ONCE A WEEK. TAKE WITH A FULL GLASS OF WATER ON AN EMPTY STOMACH FIRST THING IN THE MORNING.   azithromycin 250 MG tablet Commonly known as:  ZITHROMAX Take 1 tablet (250 mg total) by mouth as directed.   budesonide-formoterol 160-4.5 MCG/ACT inhaler Commonly known as:  SYMBICORT Inhale 2 puffs into the lungs 2 (two) times daily.   furosemide 20 MG tablet Commonly known  as:  LASIX Take 1 tablet (20 mg total) by mouth daily as needed for fluid or edema.   gabapentin 300 MG capsule Commonly known as:  NEURONTIN TAKE 1 CAPSULE BY MOUTH TWICE DAILY   ipratropium-albuterol 0.5-2.5 (3) MG/3ML Soln Commonly known as:  DUONEB Take 3 mLs by nebulization 2 (two) times daily.   levothyroxine 75 MCG tablet Commonly known as:  SYNTHROID Take 1 tablet (75 mcg total) by mouth daily before breakfast.   LORazepam 1 MG tablet Commonly known as:  ATIVAN TAKE 1/2 TO 1 (ONE-HALF TO ONE) TABLET BY MOUTH THREE TIMES DAILY AS NEEDED **DISCONTINUE XANAX**   Melatonin 5 MG Caps   methylPREDNISolone 8 MG tablet Commonly known as:  MEDROL Take 1/2 tablet every other day   mirtazapine 30 MG tablet Commonly known as:  REMERON Take 1 tablet (30 mg total) by mouth at bedtime.   simvastatin 10 MG tablet Commonly known as:  ZOCOR Take 1 tablet (10 mg total) by mouth at bedtime.   traMADol 50 MG tablet Commonly known as:  ULTRAM TAKE 1/2  (ONE-HALF) TABLET BY MOUTH EVERY 6 HOURS AS NEEDED   ULTRAVATE 0.05 % ointment Generic drug:  halobetasol       Where to Get Your Medications    These medications were sent to [facility], [place]  [address], [place] [address]   Phone:  [phone]   methylPREDNISolone 8 MG tablet

## 2017-07-03 ENCOUNTER — Telehealth: Payer: Self-pay | Admitting: Adult Health

## 2017-07-03 NOTE — Telephone Encounter (Signed)
Copied from [place] [phone]. Topic: Quick Communication - See Telephone Encounter >> Jul 03, 2017 10:33 AM Boyd Kerbs wrote: CRM for notification. See Telephone encounter for:  Heidi [name] from  [place]  [phone] patient's current brand is [place] is asking if can change to [place] brand for  Ledothyroxine 07/03/17.

## 2017-07-03 NOTE — Telephone Encounter (Signed)
Called and spoke to Heidi [name].  Advised to proceed with medication change.  Pt is scheduled for 09/28/17 for TSH in lab.

## 2017-07-04 ENCOUNTER — Telehealth: Payer: Self-pay | Admitting: Family Medicine

## 2017-07-04 NOTE — Telephone Encounter (Signed)
Request from [place] to dispense Mylan brand of Synthroid?

## 2017-07-04 NOTE — Telephone Encounter (Signed)
Called and spoke to [place] at the pharmacy. Informed her that I gave authorization to [facility] to change medication brands on 07/03/17.  Estill Bamberg checked and found no documentation in the file.  She will update the file and get medication to the pt.

## 2017-07-23 ENCOUNTER — Other Ambulatory Visit: Payer: Self-pay | Admitting: Pulmonary Disease

## 2017-07-23 ENCOUNTER — Telehealth: Payer: Self-pay | Admitting: *Deleted

## 2017-07-23 ENCOUNTER — Telehealth: Payer: Self-pay | Admitting: Pulmonary Disease

## 2017-07-23 MED ORDER — AZITHROMYCIN 250 MG PO TABS: ORAL_TABLET | ORAL | 0 refills | Status: DC

## 2017-07-23 NOTE — Telephone Encounter (Signed)
Z pack sent into pharmacy on file. Pt notified and verbalized understanding. Nothing further needed.

## 2017-07-23 NOTE — Telephone Encounter (Signed)
Spoke with pt who gave permission to speak to [facility].  Pt c/o increased weakness, fatigue, prod cough with yellow mucus X1 week.  Denies fever, muscle aches, sinus congestion.    Pt has been taking mucinex, drinking hot tea to help with s/s.  Requesting additional recs- particularly a zpak.    [facility] [place].   SN please advise on recs.  Thanks.

## 2017-07-23 NOTE — Telephone Encounter (Signed)
Zpac called in today by pulmonology.

## 2017-07-23 NOTE — Telephone Encounter (Signed)
Patient Name: Heidi [name] Gender: Female DOB: 01-Oct-1940 Age: 77 Y 10 M 16 D Return Phone Number: [phone] (Primary), [phone] (Secondary) Address: [place] [address] Client [facility] Night - Client Client Site [facility] - Night Physician Dorothyann Peng - NP Contact Type Call Who Is Calling Patient / Member / Family / Caregiver Call Type Triage / Clinical Caller Name Doylene Canning Relationship To Patient Other relative Return Phone Number [phone] (Primary) Chief Complaint Cough Reason for Call Symptomatic / Request for Health Information Initial Comment Caller Doylene Canning stated Darlyn Repsher is coughing up green mucus and last time this happen a cpac was ordered. Translation No Nurse Assessment Nurse: Luther Parody, RN, Malachy Mood Date/Time (Eastern Time): 07/21/2017 10:11:51 AM Confirm and document reason for call. If symptomatic, describe symptoms. ---Caller states that her sister in law has had a productive cough of green mucous for the last 3 days along with nasal congestion. Denies fever. Does the patient have any new or worsening symptoms? ---Yes Will a triage be completed? ---Yes Related visit to physician within the last 2 weeks? ---No Does the PT have any chronic conditions? (i.e. diabetes, asthma, etc.) ---Yes List chronic conditions. ---copd, chf Is this a behavioral health or substance abuse call? ---No Guidelines Guideline Title Affirmed Question Affirmed Notes Nurse Date/Time (Eastern Time) Cough - Acute Productive [1] Continuous (nonstop) coughing interferes with work or school AND [2] no improvement using cough treatment per Care Advice Luther Parody, RN, Malachy Mood 07/21/2017 10:14:18 AM Disp. Time Eilene Ghazi Time) Disposition Final User 07/21/2017 10:17:51 AM See Physician within [place] Hours Yes Luther Parody, RN, Malachy Mood

## 2017-08-06 ENCOUNTER — Ambulatory Visit: Admitting: Pulmonary Disease

## 2017-08-07 ENCOUNTER — Other Ambulatory Visit: Payer: Self-pay | Admitting: Pulmonary Disease

## 2017-08-07 ENCOUNTER — Other Ambulatory Visit: Payer: Self-pay | Admitting: Adult Health

## 2017-08-07 DIAGNOSIS — G4700 Insomnia, unspecified: Secondary | ICD-10-CM

## 2017-08-07 DIAGNOSIS — F329 Major depressive disorder, single episode, unspecified: Secondary | ICD-10-CM

## 2017-08-07 DIAGNOSIS — F32A Depression, unspecified: Secondary | ICD-10-CM

## 2017-08-20 ENCOUNTER — Other Ambulatory Visit: Payer: Self-pay | Admitting: Pulmonary Disease

## 2017-08-20 NOTE — Telephone Encounter (Signed)
Received refill request for Lorazepam 1mg . Take 1/2 tab three times per day as needed #90 with 2 refills.Last OV was 05/29/17. Next OV is 08/22/17. Last filled 02/02/17.  Dr Lenna Gilford please advise

## 2017-08-22 ENCOUNTER — Ambulatory Visit (INDEPENDENT_AMBULATORY_CARE_PROVIDER_SITE_OTHER)
Admission: RE | Admit: 2017-08-22 | Discharge: 2017-08-22 | Disposition: A | Discharge status: Home / Self Care | Payer: Medicare Other | Source: Ambulatory Visit | Attending: Pulmonary Disease | Admitting: Pulmonary Disease

## 2017-08-22 ENCOUNTER — Ambulatory Visit (INDEPENDENT_AMBULATORY_CARE_PROVIDER_SITE_OTHER): Admitting: Pulmonary Disease

## 2017-08-22 ENCOUNTER — Encounter: Payer: Self-pay | Admitting: Pulmonary Disease

## 2017-08-22 ENCOUNTER — Other Ambulatory Visit (INDEPENDENT_AMBULATORY_CARE_PROVIDER_SITE_OTHER): Payer: Medicare Other

## 2017-08-22 VITALS — BP 110/58 | HR 88 | Ht 63.0 in | Wt 112.6 lb

## 2017-08-22 DIAGNOSIS — J449 Chronic obstructive pulmonary disease, unspecified: Secondary | ICD-10-CM

## 2017-08-22 DIAGNOSIS — E039 Hypothyroidism, unspecified: Secondary | ICD-10-CM

## 2017-08-22 DIAGNOSIS — F411 Generalized anxiety disorder: Secondary | ICD-10-CM | POA: Diagnosis not present

## 2017-08-22 DIAGNOSIS — I679 Cerebrovascular disease, unspecified: Secondary | ICD-10-CM

## 2017-08-22 DIAGNOSIS — R9389 Abnormal findings on diagnostic imaging of other specified body structures: Secondary | ICD-10-CM

## 2017-08-22 DIAGNOSIS — J9611 Chronic respiratory failure with hypoxia: Secondary | ICD-10-CM | POA: Diagnosis not present

## 2017-08-22 DIAGNOSIS — I5032 Chronic diastolic (congestive) heart failure: Secondary | ICD-10-CM

## 2017-08-22 LAB — CBC WITH DIFFERENTIAL/PLATELET
BASOS ABS: 0 10*3/uL (ref 0.0–0.1)
BASOS PCT: 0.6 % (ref 0.0–3.0)
EOS ABS: 0.1 10*3/uL (ref 0.0–0.7)
Eosinophils Relative: 1.3 % (ref 0.0–5.0)
HEMATOCRIT: 39.3 % (ref 36.0–46.0)
HEMOGLOBIN: 12.6 g/dL (ref 12.0–15.0)
Lymphocytes Relative: 33 % (ref 12.0–46.0)
Lymphs Abs: 1.7 10*3/uL (ref 0.7–4.0)
MCHC: 32.1 g/dL (ref 30.0–36.0)
MCV: 92.2 fl (ref 78.0–100.0)
MONO ABS: 0.4 10*3/uL (ref 0.1–1.0)
Monocytes Relative: 7.1 % (ref 3.0–12.0)
Neutro Abs: 3 10*3/uL (ref 1.4–7.7)
Neutrophils Relative %: 58 % (ref 43.0–77.0)
Platelets: 180 10*3/uL (ref 150.0–400.0)
RBC: 4.27 Mil/uL (ref 3.87–5.11)
RDW: 15.1 % (ref 11.5–15.5)
WBC: 5.2 10*3/uL (ref 4.0–10.5)

## 2017-08-22 LAB — COMPREHENSIVE METABOLIC PANEL
ALK PHOS: 39 U/L (ref 39–117)
ALT: 7 U/L (ref 0–35)
AST: 14 U/L (ref 0–37)
Albumin: 3.8 g/dL (ref 3.5–5.2)
BILIRUBIN TOTAL: 0.3 mg/dL (ref 0.2–1.2)
BUN: 11 mg/dL (ref 6–23)
CALCIUM: 9 mg/dL (ref 8.4–10.5)
CO2: 39 mEq/L — ABNORMAL HIGH (ref 19–32)
Chloride: 103 mEq/L (ref 96–112)
Creatinine, Ser: 0.74 mg/dL (ref 0.40–1.20)
GFR: 81.11 mL/min (ref >60.00)
GLUCOSE: 89 mg/dL (ref 70–99)
POTASSIUM: 4.5 meq/L (ref 3.5–5.1)
Sodium: 143 mEq/L (ref 135–145)
TOTAL PROTEIN: 6.4 g/dL (ref 6.0–8.3)

## 2017-08-22 LAB — SEDIMENTATION RATE: SED RATE: 6 mm/h (ref 0–30)

## 2017-08-22 LAB — TSH: TSH: 0.87 u[IU]/mL (ref 0.35–4.50)

## 2017-08-22 LAB — BRAIN NATRIURETIC PEPTIDE: PRO B NATRI PEPTIDE: 38 pg/mL (ref 0.0–100.0)

## 2017-08-22 MED ORDER — FLUTICASONE-UMECLIDIN-VILANT 100-62.5-25 MCG/INH IN AEPB: 1.0000 | INHALATION_SPRAY | Freq: Every day | RESPIRATORY_TRACT | 6 refills | Status: DC

## 2017-08-22 MED ORDER — IPRATROPIUM-ALBUTEROL 0.5-2.5 (3) MG/3ML IN SOLN: 3.0000 mL | Freq: Three times a day (TID) | RESPIRATORY_TRACT | 3 refills | Status: DC

## 2017-08-22 NOTE — Patient Instructions (Signed)
Today we updated your med list in our EPIC system...    Continue your current medications the same...  We decided to increase your DUONEB treatments to 3 times daily...  We decided to change your Symbicort to the new TRELEGY - one inhalation once daily...  Continue the Medrol 8mg  tabs-  1/2 tab every other day...  Today we checked your follow up CXR & Blood work...    We will contact you w/ the results when available to see if any additional medication would help your breathing...  Call for any questions...  Let's plan a follow up visit in 43mo, sooner if needed for problems.Marland KitchenMarland Kitchen

## 2017-08-23 ENCOUNTER — Encounter: Payer: Self-pay | Admitting: Pulmonary Disease

## 2017-08-23 MED ORDER — ACETAZOLAMIDE 250 MG PO TABS: 250.0000 mg | ORAL_TABLET | Freq: Every day | ORAL | 5 refills | Status: DC

## 2017-08-23 NOTE — Progress Notes (Signed)
Subjective:     Patient ID: Heidi [name], female   DOB: [birth year], 77 y.o.   MRN: [id number]  HPI 77 y/o WF former smoker who quit 2016 w/ severe COPD/emphysema & chronic hypoxemic and hypercarbic resp failure, placed on Hospice by DrSwords/ DrBurchette in 2016, hx pneumonia and small bilat pulm nodules on CT Chest, and diastolic CHF...   ~  May 13, 2015:  Initial pulmonary consult by SN>        Heidi [name] was [name] 10/3 - 04/27/15 by [facility] after presenting via EMS w/ cough, weakness, increased SOB; she was still smoking, not on home O2; found to have COPD exac & CAP w/ bibasilar opacities, acute on chronic hypoxemic & hypercarbic resp failure, & superimposed CHF w/ bilat L>R pleural effusions (this was tapped 10/9 yielding transudative fluid, neg cytology, neg culture)... She was seen by Chuichu attended her in the hosp; Treated w/ Rocephin/ Zithromax, Solumedrol, Nebs, & brief stay in step-down unit on BiPap; disch on ADVAIR100-1spBid, SPIRIVA daily, PRED taper, NEBS w/ Albut... She was also treated for acute on chronic diastolic CHF- mod dil RV & reduced RV sys function (w/ preserved LVF) on 2DEcho & diuresed w/ Lasix but only disch on 38m prn for edema... Hospice was consulted (DrGolding's note reviewed) and she has entered the Hospice program (she is a DNR, DNI) w/ a prognosis est <681mo she was disch on ROXANOL 2012ml--take 1/2 ml Q2H as needed for pain, SOB, anxiety...  She last saw DrSwords 11/2013, she has established new PCP- CorDorothyann PengDrBurchette- has post hosp f/u pending; Note> she cancelled 2 appts w/ DrMcQuaid recently...       Currently Heidi [name] is very stoic- states she's not having any breathing problems at the present time & credits the thoracentesis w/ her remarkable improvement; her last cigarette was 04/19/15 prior to her admission; she was disch on O2 at 4L/min & has an O2sat=89% on this in the office today (RA sat was 74%); she was unable to perform PFT today; we had  a frank discussion about her severe, end-stage COPD...       Epic records indicate that she was seen by DrWert in 2011> on Spiriva +/- Advair, stated she quit smoking 2011 on adm for COPD exac, PFT reported FEV1=1.32 (77%), FEV1/FVC ratio=50%, DLCO=70%; CXR showed COPD & biapical pleuroparenchymal scarring, CT Chest 4/11 showed ?mult pulm nodules ?early cavitation r/o MAI, cultures- only NTF, O2sat=95% on RA...  Medical Hx>   She had an annual wellness exam from her PCP 02/2015> COPD, Hx pulm nodule, Hx of pneumonia; Hypothyroidism; adenomatous colonic polyps; Hyperlipidemia; Sigmoid volvulus; Depression; Cerebrovascular disease; Positive ANA; Diverticulosis; Esophageal stricture; Arthritis; History of stroke; Internal hemorrhoids; and Rectal ulcer; protein-calorie malnutrition (she weighs 78 lbs...  EXAM shows Afeb, VSS, O2sat=89% on O2 at 4L/min; Wt=78#, 5'2"Tall, BMI=16;  Heent- neg, mallampati1;  Chest- decr BS bilat, scat rhonchi, & end-exp wheezing;  Heart- RR gr1/6 SEM w/o r/g;  Abd soft, neg;  Ext- w/o c/c/ tr edema...   Baseline CXR 12/31/14 showed hyperinflation c/w COPD, biapical pleuroparenchymal scarring, nipple shadows and T12 compression fx...  CT Chest 02/22/15 showed cardiomeg, atherosclerotic changes in Ao & coronaries, small right effusion; emhysema w/ several additional findings- RUL & lingular subpleural opacities, & 5mm14mL & RLL nodules; osteopenia and T7 +T12 compression fractures...   CXRs performed during the 04/2015 Admission showed cardiomeg, CHF w/ left effusion, COPD w/ interstitial edema & LLL opac => improved post thoracentesis  10/9 w/ 400cc removed...  ABGs during the 10/16 Hosp showed pCO2 in the 73-87 range w/ resp acidosis...   LABS 10/16 reviewed> CBC- ok w/ Hg=13 range;  Chems- note HCO3=44-45 range & she may benefit drom Acetazolamide added to her diuretic regimen if more aggressive treatment is desired...  IMP>> Heidi [name] has severe COPD w/ acute on chronic hypoxemic &  hypercarbic resp failure & cor pulmonale; her last cigarette was the day of admission 04/19/15... She also has a hx of poor compliance w/ med rx & she has once again stopped the prescribed meds perceiving that she is much better since the thoracentesis; she was referred to Hospice for outpt follow up & will continue to see her PCPs at [facility]; I told her I would be happy to recheck at any time if she so desired...  NOTE> since her disch she states that her breathing has been "so much better" that she hasn't needed her inhalers or the nebulizer, and she's off the Pred; she states she is taking the ASA81, Lasix20, Synthroid137, Xanax, and Remeron; she apparently hasn't needed the Roxanol yet...  We had a frank discussion about her end-stage disease & we "proved it to her" by checking her O2sat off the oxygen (74%), and attempting to get a Spirometry tracing (she couldn't do it);  Pt is asked to get back on her meds & we outlined a regimen>  NEBS w/ Albut Qid (breakfast, lunch, dinner, & bedtime);  ADVAIR100- 2spBid (taken after the Neb at breakfast & dinner);  SPIRIVA via handihaler once daily (taken after the Neb at lunch)...   ~  January 10, 2016:  8mo ROV w/ SN>  NOTE: family has moved her into her own apt 12/2015) next to her Sister-in-law/ care giver's apt      Heidi [name] returns for a follow up visit- she has severe COPD/emphysema w/ chronic hypoxemic and hypercarbic resp failure; [facility] Dr[facility]chette in Oct2016; she has been followed by NP-CoryNafziger since then;  She returns today w/ her sister-in-law care giver Nancy #[phone] indicating that Hospice is planning to stop services soon but pt is c/o severe LBP x 6wks ever since she lifted a chlorox bottle & felt a "pop" in her back; she has mentioned this to Hospice but all they have done is to adjust her pain meds (Percocet5-325 & Roxanol 20mg/ml- 0.5ml Q2H prn) without further eval;  She states that her breathing is  OK- meaning she could get about in her apt before the episode of acute LBP 2wks ago;I note that she has not had any lab work or f/u CXR since her disch from the Hosp 04/2015 & she is clearly in need of same- she has a hx of compression fx of spine and osteoporosis but not on any bone building therapy... We reviewed the following medical problems during today's office visit >>     Severe COPD> she could not perform simple spirometry when attempted 04/2015; on NEBS w/ Albut Q6H prn but it makes her too jittery, SYMBICORT160-2spBid & not using the SPIRIVA due to $$;  REC to use Albut 1/2vial in NEB Tid regularly followed by Symbicort160-2spBid & try INCRUSE once daily => we plan ROV in 6wks w/ repeat spirometry & oximetry if she can walk...     Chronic hypoxemic and hypercarbic resp failure> O2sat was 74% on RA 10/16 and ABGs in Hosp showed pCO2=73-87 w/ resp acidosis; she is now on O2 at 2.5L/min regularly &   looks much better, chest is clearer and labs improved; we will check ambulatory oximetry on ret if able to walk for [place]...    Hx pneumonia> resolved- CXR 12/2015 shows COPD & is clear, no opacity...    Hx bilat pulm nodules on CT Chest> Her CXR is much improved 01/10/16; she will be due for a f/u CT Chest on return 02/2016...    Ex-smoker, quit 04/2015>  congrats on not smoking!    Diastolic CHF w/ pulm edema & transudative left pleural effusion 04/2015> on Lasix20 prn only    Hx cerebrovasc dis & stroke> this must have occurred in [place]- no data in EPIC & no prev CDopplers to review; rec to take ASA81/d & we will check CDopplers later...    MEDICAL Issues>  Hyperlipidemia (not on meds),  Hypothyroidism (on Synthroid137/d),  Prot-cal malnutrition (wt is up 38# & BMI=21 now),      Hx HH repair & esoph stricture> not on PPI rx at present    Hx colon polyps, divertics, hems (w/ banding), & cecal volvulus s/p right hemicolectomy at [facility] 2012> aware...    S/P TAH & BSO> aware    DJD, Back pain w/  compression fxs> she indicates that she "cracked my spine" about 67yr ago, says she was hosp x4d w/ MRI, had brace applied- no shots or vertebroplasty ([facility] in [facility] from [facility];      Osteoporosis> she had BMD 01/2015 by PCP w/ TScore -4.8 in Lspine & -3.7 in Left FemNeck; placed on Ca++, VitD OTC, and FOSAMAX70/wk started; she picked up #4 tabs 02/11/15 but never refilled the med...    Hx anxiety/ depression> on XANAX 0.571mid prn and REMERON 1559mhs prn...    Hx poor compliance w/ medical regimen> this was before Hospice involvement in her care (04/2015) EXAM shows Afeb, VSS, O2sat=97% on O2 at 2.5L/min; Wt was 116# in [id number] 38# to BMI=21);  Heent- neg, mallampati1;  Chest- decr BS bilat w/o w/r/r;  Heart- RR gr1/6 SEM w/o r/g;  Abd soft, neg;  Ext- w/o c/c/e in wheelchair & won't stand/walk due to LBP...  CXR 01/10/16>  Much improved- some hyperinflation, bilat apical pleural thickening & bibasilar scarring, no effusion/ edema/ consolidation/ adenopathy, stable ant wedging of T12 w/ kyphosis...   Lumbar spine XRay 01/10/16>  Anterior wedging of L2 is new, old fx of T12, no other fxs & no spondylolithesis- disc sp narrowing L2-3 & L3-4, facet arthritis L5-S1, calcif in abd Ao...  LABS 12/2015>  Chems- wnl, HCO3=32, renal=wnl;  CBC- wnl;  TSH=0.01 on synthroid137;  BNP=70;  Sede=3 IMP/PLAN>>  Respiratory-wise she is improved, CXR is improved, etc but she needs to continue taking O2 and meds regularly- O2 at 2L/min by [place] 24/7, NEBS w/ Albut using 1/2 vial TID every day followed by SYMBICORT160- 2spBid and new INCRUSE one inhalation daily;  XRay of Lspine shows new compression in L2, and Epic review showed that she only took Alendronate70 for 4 wks in Aug2016- she never refilled the Rx;  Discussed CXR & Lumbar films w/ Sister-in-law care giver Nancy=> REC that Leanza re-start Calcium/ VitD/ ALENDRONATE70/wk & stay on this; she has a back  brace at home & pain meds from Hospice, needs PT for ambulation;  Second issue is her oversuppressed TSH on Synth137=> REC decr dose to 112 & recheck TSH on return... ROV planned in 6wks time.  ~  February 23, 2016:  6wk ROV & f/u w/ SN>  Heidi [name] has done remarkably well from the pulm standpoint- stabilized and improved suffic to be able to move into her own appt & care for herself w/ family support nearby; she says she feels like she's getting better too; she was working w/ PT at home but she thinks they were working her too hard & she sent them away- I explained that this therapy is the MOST IMPORTANT thing for her continued recovery!  She tells me that this is the last month of her yr on Hospice, she has Roxanol on her med list but thank goodness she hasn't needed it!  She is in a wheelchair in the office today- but tells me she is up & about at home=> we will walk her today & check O2sats...    1) Severe COPD> she could not perform simple spirometry when attempted 04/2015; on NEBS w/ Albut Q6H prn & using 1/2 vial 2-3x per wk, SYMBICORT160-2spBid & Incruse once daily; Hx medication noncompliance & not using the NEBS Tid as requested!     2) Chronic hypoxemic and hypercarbic resp failure> O2sat was 74% on RA 10/16 and ABGs in Hosp showed pCO2=73-87 w/ resp acidosis; she is now on O2 at 2.5L/min regularly & looks much better, chest is clearer and labs improved; ambulatory oximetry 02/2016 showed no desaturation on 2.5L/min...     3) Hx pneumonia> resolved- CXR 12/2015 shows COPD & clear, no opacity...    4) Hx bilat pulm nodules on CT Chest> Her CXR is much improved 01/10/16; she will be due for a f/u CT Chest to check these nodules soon...    5) Ex-smoker, quit 04/2015>  congrats on not smoking!    6) Diastolic CHF w/ pulm edema & transudative left pleural effusion 04/2015> on Lasix20 prn only now    7) Hx cerebrovasc dis & stroke> this must have occurred in [place]- no data in EPIC & no prev CDopplers to  review; rec to take ASA81/d & she will need CDopplers from her PCP on ret...    8) MEDICAL Issues>  Hyperlipidemia (not on meds),  Hypothyroidism (on Synthroid112/d),  Prot-cal malnutrition (wt is up to 114# & BMI=20 now); I reviewed all her TSH readings and called her Pharm ([facility] in [place]) & they confirmed poor med compliance in past- eery time her dose was increased she was NOT taking her meds regularly!  She is now on 112mcg/d and pill counts are accurate- TSH=0.02 so this dose is still too high for her; WE WILL DECR DOSE TO 75mcg/d and turn over follow up of this problem to her PCP- DrCory & DrBurchette...    9) Hx HH repair & esoph stricture> not on PPI rx at present    10) Hx colon polyps, divertics, hems (w/ banding), & cecal volvulus s/p right hemicolectomy at [place] 2012> aware...    11) S/P TAH & BSO> aware    12) DJD, Back pain w/ compression fxs> she indicates that she "cracked my spine" about 4yrs ago, says she was hosp x4d w/ MRI, had brace applied- no shots or vertebroplasty (this must have been done at [facility] as there are no XRay reports in Epic from 2012-2016; she was taking Roxanol for pain but will be leaving hospice soon & her PCP will assume care of her osteoporosis, back pain=> pain meds and bone building therapy.    13) Osteoporosis> she had BMD 01/2015 by PCP w/ TScore -4.8 in Lspine & -3.7 in   Left FemNeck; placed on Ca++, VitD OTC, and FOSAMAX70/wk started; she picked up #4 tabs 02/11/15 but never refilled the med...    14) Hx anxiety/ depression> on XANAX 0.5mgTid prn and REMERON 15mg Qhs prn...    15) Hx poor compliance w/ medical regimen> this was before Hospice involvement in her care (04/2015) EXAM shows Afeb, VSS, O2sat=97% on O2 at 1.5L/min; Wt = 114#, BMI=21;  Heent- neg, mallampati1;  Chest- decr BS bilat w/o w/r/r;  Heart- RR gr1/6 SEM w/o r/g;  Abd soft, neg;  Ext- w/o c/c/e in wheelchair today...  Ambulatory oximetry 02/23/16>  O2sat=94% on 1.5L/min at rest  w/ pulse=82/min;  She ambulated 3 Laps pushing her wheelchair w/ lowest O2sat=91% w/ HR=110/min...  LABS 02/23/16 showed TSH=0.02 on Levothy 112mcg/d taken regularly (confirmed by [facility])... IMP/PLAN>>  Heidi [name] has stabilized from the cardiopulm standpoint & she is instructed to continue her current meds regularly Symbicort & Incruse) plus her NEBS, VentHFA, & Lasix20 as needed;  Looks like she needs CDoppler f/u, f/u FLP, management of her thyroid medication to insure compliance w/ Rx, and management of her osteoporosis/ bone building therapy & her back pain due to compression fxs => note sent to her PCP for all these...  ~  April 10, 2016:  6wk ROV w/ SN>  Heidi [name] returns for an add-on visit after she received visit here in [place] from family in [place] including grandkids w/ URI;  She was coughing up green mucus, called Hospice & they did a portable at home CXR that is said to be OK, clear, and treated her w/ Prednisone, "It was just a virus" she says, now improved... She is actually doing very well w/o acute resp symptoms and she has remained on her Pred10, NEBS w/ Albut- 1/2 vial Tid, Symbicort160-2Bid, Incruse once daily... She is c/o her port O2 system-- has difficulty filling the tanks, too heavy for her, she's "scared of it"=> needs POC (wants simply-Go mini) & we will contact [facility] to change out her system... She is also due for her follow up CT Chest to compare to 02/2015...    Since she was here last she had a f/u visit w/ PCP- [name] at [facility]> f/u depression- improved on Remeron for sleep, LBP- hospice XRays reported neg but showed L2 & T12 compressions- given Medrol dosepak & Salonpas patches... He has since reviewed my last OV note w/ requests for [facility] f/u for her medical issues- hypothyroid w/oversuppressed TSH on Synthroid (we cut her down to 75mcg/d), osteoporosis & compression fxs, due for f/u CDopplers...  SEE PROB LIST ABOVE>>     EXAM shows Afeb, VSS, O2sat=95% on O2  at ?3L/min; Wt = 120#, BMI=22;  Heent- neg, mallampati1;  Chest- decr BS bilat w/o w/r/r;  Heart- RR gr1/6 SEM w/o r/g;  Abd soft, neg;  Ext- w/o c/c/e;  Neuro- weak, no focal deficits...  LABS 04/10/16 per Cory> TSH=0.67 on Synthroid75- continue same;  VitD=16 & needs supplement w/ 50K weekly... IMP/PLAN>>  COPD/ chr resp failure stable on current regimen- continue same;  Thyroid is improved on the 75mcg/d taken regularly- continue same;  She needs VitD and bone building therapy=> start VitD50K per week and ALENDRONATE 70mg one per week- must get these filled regularly & take them weekly going forward without fail...  ~  May 11, 2016:  1mo ROV & add-on appt requested for dyspnea> Heidi [name] returns for an add-on visit "because I couldn't get thru to DrCory" c/o incr SOB, hard to cough   up any phlegm, denies f/c/s, c/o no appetite, shakey/ snappy/ agitated and incr SOB w/ activity & ADLs, feels like she can't get a deep breath, "anxious all the time"- says all this occurred when PRED weaned to 1/2 tab daily & she's been off x 2wks now; Hospice came out to her house & did a CXR- reported to be clear/ NAD (not avail for review, last CXR in Epic 01/10/16=> COPD, NAD, kyphosis & compression T12;...     EXAM shows Afeb, VSS, O2sat=96% on O2 at 3L/min; Wt = 124#, BMI=22;  HEENT- neg, mallampati1;  Chest- decr BS bilat w/o w/r/r;  Heart- RR gr1/6 SEM w/o r/g;  Abd soft, neg;  Ext- w/o c/c/e;  Neuro- weak, no focal deficits... IMP/PLAN>>  Heidi [name] has been using her NEBULIZER just prn & averaging once qod or so- we discussed a regular treatment regimen that includes using her NEB w/ 1/2 vial of Albut Tid regularly followed by her Symbicort160-2spBid & the Incruse once daily;  We will restart a systemic steroid med- try MEDROL 8mg tabs (one Bid for 10d then down to 1Qam til return);  In addition she will increase her Alprazolam to 1 tab Tid regularly (she's been on 1Bid but still anxious);  Keep other meds the same including  Alendronate70 once per week, Vit D 50K once per week, and Synthroid75 daily 1st thing in the Am;  We plan ROV recheck in 3wks time...  ~  June 05, 2016:  1mo ROV & add-on appt at request of Hospice nurse who said she had a "little rattle" in her chest- pt denies symptoms indicating chr stable SOB/DOE, no cough/ sput/ hemoptysis/ CP/ edema...  As noted Malky has severe COPD (on Medrol8, NEBS w/ 1/2 vial Albut Tid followed by Symbicort160-2spBid & Incruse once daily); chr hypoxemic & hypercarbic resp fail (on O2 at 2.5L/min); Abn Chest CT w/ bilat pulm nodules; Ex-smoker (quit 04/2015); Hx pneumonia, Hx diastolic CHF w/ pulm edema & transudative L effusion 04/2015 (on Lasix20)... Her dyspnea is multifactorial w/ all these problems in addition to anxiety & poor medication compliance...    EXAM shows Afeb, VSS, O2sat=94% on O2 at 2L/min; Wt = 130# up 7#;  HEENT- neg, mallampati1;  Chest- decr BS bilat w/ few velcro rales at bases;  Heart- RR gr1/6 SEM w/o r/g;  Abd soft, neg;  Ext- w/o c/c/e;  Neuro- weak. IMP/PLAN>>  Heidi [name] is concerned about the 7# wt gain on Medrol8 so we decided to decr this to 1/2 tab Qam; otherw continue same meds & we plan ROV in 3mo...  ~  June 19, 2016:  2wk ROV & add-on appt requested for head & chest congestion, cough w/ thick green mucus, and SOB- can't get a DB; pt denies hemoptysis/ f/c/s/ CP/ edema/ etc... As noted Heidi [name] has severe COPD (on Medrol8-1/2 daily (but she stopped it on her own), NEBS w/ 1/2 vial Albut Tid followed by Symbicort160-2spBid & Incruse once daily); chr hypoxemic & hypercarbic resp fail (on O2 at 2.5L/min); Abn Chest CT w/ bilat pulm nodules; Ex-smoker (quit 04/2015); Hx pneumonia, Hx diastolic CHF w/ pulm edema & transudative L effusion 04/2015 (on Lasix20)... Her dyspnea is multifactorial w/ all these problems in addition to anxiety (on Xanax0.5mg but not taking regularly) & poor medication compliance (eg- still only using NEB bid)... NOTE> she called PCP  but didn't get response, Hospice stopped ~1wk ago; she wants POC...    EXAM shows Afeb, VSS, O2sat=93% on O2 at 2L/min; Wt = 131#;    HEENT- neg, mallampati1;  Chest- decr BS bilat w/ few velcro rales at bases;  Heart- RR gr1/6 SEM w/o r/g;  Abd soft, neg;  Ext- w/o c/c/e;  Neuro- weak.  CXR 06/19/16 (independently reviewed by me in the PACS system) showed COPD & incr markings both bases c/w Atx- chr changes include biapical pleural thickening, cardiomeg, Ao atherosclerosis, modHH, T12 compression fx...   LABS 06/19/16>  Chems- ok w/ HCO3=31 otherw wnl;  CBC- wnl w/ wbc=8.4;  Sed=49;  BNP=19... IMP/PLAN>>  Once again Heidi [name] is her own worse enemy by stopping the Medrol on her own & decreasing the Nebs to Bid; we spent extra time reviewing the rationale behind the Med Rx including Medrol (rec to incr to 74m/d again), NEBS w/ albut (1/2 vial TID) followed by the Symbicort160-2spBid & Incruse once daily; asked to incr the Mucinex12020mBid w/ fluids, and take the Alprazolam more regularly eg- 1/2 tab tid,  +continue her O2 etc... She requests a POC so she can incr her mobility... We plan ROV in 43m74mor recheck... Note: >50% of this 73m38misit was spent in counseling and coordination of care...  ~  August 14, 2016:  43mo 66mo& pulmonary follow up visit>  Heidi Lisetrts a good interval & indicates that her breathing is at baseline; once again however she has decreased the NEBs to just 1-2/d & she tells me that sometimes she skips a day (she believes that this means she is doing better); she is using the Symbicort & Incruse regularly; she remains on the Medrol8mg t67m taking 1/2 tab daily; she remains on O2 at 2L/min flow but still has the port tanks and [facility]- she tells me that she knows that insurance will cover this because she has Medicare, [employer]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 We reviewed the following medical problems during today's office visit >>     1) Severe COPD> she could not perform simple spirometry when attempted 04/2015; on MEDROL8-1/2Qam, NEBS w/ Albut Tid & using 1/2 vial 1-2x/d, SYMBICORT160-2spBid & Incruse once daily; Hx medication noncompliance & not using the NEBS Tid as requested!     2) Chronic hypoxemic and hypercarbic resp failure> O2sat was 74% on RA 10/16 [place] [facility]73[place]87 w/ resp acidosis; she is now on O2 at 2.5L/min regularly & stable, chest is clearer and labs improved; ambulatory oximetry 02/2016 showed no desaturation on 2.5L/min...     Marland KitchenMarland Kitchen Hx pneumonia> resolved- last CXR 06/2016 shows COPD & clear, no opacity...    4Marland KitchenMarland KitchenHx bilat pulm nodules on CT Chest> Her CXRs are much improved; her last CT Chest was 02/2015 & she never got the planned 03/2016 f/u CT scan...    5) Ex-smoker, quit 04/2015>  congrats on not smoking!    6) Diastolic CHF w/ pulm edema & transudative left pleural effusion 04/2015> on Lasix20 prn for edema...    7Marland KitchenMarland KitchenHx cerebrovasc dis & stroke> this must have occurred in [place] in EPIC & no prev CDopplers to review; rec to take ASA81/d & DrCory never ordered the CDopplers...    8) MEDICAL Issues>  Hyperlipidemia (not on meds),  Hypothyroidism (on Synthroid75/d),  Hx Prot-cal malnutrition (wt is up to 132# & BMI=23 now); Note>  I reviewed all her TSH readings and called her Pharm [facility] in [place]) & they confirmed poor med compliance in past- every time her dose was increased she was NOT taking her meds regularly!  on 159mg/d and pill counts are accurate- TSH=0.02 so this dose is still too high for her; WE DECR DOSE TO 759m/d and turn over follow up of this problem to her PCP- DrCory & DrBurchette...    9) Hx HH repair & esoph stricture> not on PPI rx at present    10) Hx colon polyps, divertics, hems (w/  banding), & cecal volvulus [facility]> aware...    11) S/P TAH & BSO> aware    12) DJD, Back pain w/ compression fxs> she indicates that she "cracked my spine" about 4y59yrgo, says she was hosp x4d w/ MRI, had brace applied- no shots or vertebroplasty (this [facility] there are no XRay reports in Epic from 2012-2016; she was taking Roxanol for pain but will be leaving hospice soon & her PCP will assume care of her osteoporosis, back pain=> pain meds and bone building therapy.    13) Osteoporosis> she had BMD 01/2015 by PCP w/ TScore -4.8 in Lspine & -3.7 in Left FemNeck; placed on Ca++, VitD OTC, and FOSAMAX70/wk started=> but compliance is poor...    14) Hx anxiety/ depression> on XANAX 0.5mg82m prn and REMERON 15mg37m prn...    15) Hx poor compliance w/ medical regimen> this was before Hospice involvement in her care (04/2015)    EXAM shows Afeb, VSS, O2sat=93% on O2 at 2L/min; Wt = 131#;  HEENT- neg, mallampati1;  Chest- decr BS bilat w/ few velcro rales at bases;  Heart- RR gr1/6 SEM w/o r/g;  Abd soft, neg;  Ext- w/o c/c/e;  Neuro- weak. IMP/PLAN>>  We spent some time again reviewing the need/ rationale behind the Tid NEBS w/ 1/2 vial of Albut followed by the Symbicort Bid & Spiriva once daily; she has requested that we refill her Xanax0.5mgBi51mSynthroid75/d, and Remeron30mgQh37me [facility] pt's use... we plan ROV in 3 months...  ~  November 13, 2016:  76mo ROV69mo/u of her COPD w/ chr hypoxemic & hypercarbic resp failure> Heidi [name] calDaneillerecently w/ c/o URI, cough w/ yellow mucus & blood streaks, on her O2 at 2L/min 24/7, Medrol8mg- 1/237mily, NEBS w/ Albut using 1/2 vial but only prn & ave 1-2/wk, Symbicort160-2spBid, Incruse one daily;  We called in Levaquin Heidi [name] now...    As noted she's been off Hospice since 05/2016 & no longer taking Roxanol & Percocet; she uses Neurontin & Aleve for pain;   Her PCP is "DrCorey" & last seen 07/2016...     Last labs 06/2016 reviewed in Epic> HCO3=31, Cr=0.57, BNP=19, CBC=wnl... EXAM shows Afeb, VSS, O2sat=96% on O2 at 2L/min; Wt = 130#;  HEENT- neg, mallampati1;  Chest- decr BS bilat w/ few velcro rales at bases;  Heart- RR gr1/6 SEM w/o r/g;  Abd soft, neg;  Ext- w/o c/c/e;  Neuro- weak. IMP/PLAN>>  We decided to slowly wean the Medrol8mg tabs=27mown to 1/2 tab Qod, continue other meds regularly as prescribed; stay as active as poss at home...   ~  February 28, 2017:  76mo ROV & 576moon appt requested after a fall at home>  Pt reports fall at home 02/22/17- she lost her balance letting her dog (Lulu) out & fell onto  the dog crete, hit her ant chest wall w/ bruise & pain, didn't hit head/ no LOC;  She went to see DrCorey- note reviewed, tender over sternum, CXR w/ concern for sternal fx, and subseq CT Chest 02/26/17=> atherosclerotic calcif in Ao & coronaries, no aneurysm, no adenopathy, mod HH, scarring in lung apicies & centrilob emphysema, mild atx at bases, fx of mid sternum w/ 42m post displacement of the distal fx fragment, mod ant wedging of L2 & L7... EXAM shows Afeb, VSS, O2sat=90% on O2 at 2L/min; Wt = 128#;  HEENT- neg, mallampati1;  Chest- tender over sternum, bruising, decr BS bilat w/ few velcro rales at bases;  Heart- RR gr1/6 SEM w/o r/g;  Abd soft, neg;  Ext- w/o c/c/e;  Neuro- weak. IMP/PLAN>>  We discussed rx w/ rest, heat, TRAMADOL50 + Tylenol500, exercise extreme caution-- no falling allowed!  ~  April 11, 2017:  6wk ROV & JNychelleis improved, sternal fx & CWP are better w/ conservative rx, [facility]- it worn her out but she did ok;  Chest is improved, less tender, min cough, no sput, no hemoptysis, and SOB/DOE is about the same she says;  She reports that she is supposed to recertify for her O2 at this time=> see below, she has a home concentrator and a POC for ambulation...   We reviewed the following  medical problems during today's office visit >>     1) Severe COPD> she could not perform simple spirometry when attempted 04/2015; on MEDROL8-1/2Qam, NEBS w/ Albut Tid but NOT using regularly, SYMBICORT160-2spBid but NOT using regularly & Incruse- not using at all; Hx medication noncompliance & not using the NEBS Tid as requested!     2) Chronic hypoxemic and hypercarbic resp failure> O2sat was 74% on RA 10/16 and ABGs in HMissourishowed pCO2=73-87 w/ resp acidosis; she is now on O2 at 2.5L/min regularly & stable, chest is clearer and labs improved; ambulatory oximetry 02/2016 showed no desaturation on 2.5L/min..Marland KitchenMarland Kitchen    3) Hx pneumonia> resolved- last CXR 8/18 shows sternal fx after fall, COPD/emphysema, HH & old compression deformities in spine    4) Hx bilat pulm nodules on CT Chest> Her CXRs were much improved; her last CT Chest was 8/18 & showed mid sternal fx after fall; centrilob emphysema; aortic, great vessel, & coronary atherosclerotic calcif; no adenopathy; mod HH; prior T7 T12 L2 compressions..Marland KitchenMarland Kitchen   5) Ex-smoker, quit 04/2015>  congrats on not smoking!    6) Diastolic CHF w/ pulm edema & transudative left pleural effusion 04/2015> on Lasix20 prn for edema..Marland KitchenMarland Kitchen   7) Hx cerebrovasc dis & stroke> this must have occurred in [place] in EPIC & no prev CDopplers to review; rec to take ASA81/d & DrCory never ordered the CDopplers...    8) MEDICAL Issues>  Hyperlipidemia (not on meds),  Hypothyroidism (on Synthroid75/d),  Hx Prot-cal malnutrition (wt is 122# & BMI=20-21); Note> I reviewed all her TSH readings and [place] & they confirmed poor med compliance in past- every time her dose was increased she was NOT taking her meds regularly!  on 1155m/d and pill counts are accurate- TSH=0.02 so this dose is still too high for her; WE DECR DOSE TO 7564md and turn over follow up of this problem to her PCP- DrCory & DrBurchette...    9) Hx HH repair & esoph stricture> not on  PPI rx at present, modHH seen  on CT Chest 8/18...    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    11) S/P TAH & BSO> aware    12) DJD, Back pain w/ compression fxs> she indicates that she "cracked my spine" about 46yr ago, says she was hosp x4d w/ MRI, had brace applied- no shots or vertebroplasty ([facility] are no XRay reports in Epic from 2012-2016; she was taking Roxanol for pain but will be leaving hospice soon & her PCP will assume care of her osteoporosis, back pain=> pain meds and bone building therapy.    13) Osteoporosis> she had BMD 01/2015 by PCP w/ TScore -4.8 in Lspine & -3.7 in Left FemNeck; placed on Ca++, VitD OTC, and FOSAMAX70/wk started=> but compliance is poor...    14) Hx anxiety/ depression> on XANAX 0.'5mg'$ Tid prn and REMERON '15mg'$  Qhs prn...    15) Hx poor compliance w/ medical regimen> this was before Hospice involvement in her care (04/2015) EXAM shows Afeb, VSS, O2sat=92% on O2 at 2L/min; Wt = 122#;  HEENT- neg, mallampati1;  Chest- sl tender over sternum, bruising, decr BS bilat w/ few velcro rales at bases;  Heart- RR gr1/6 SEM w/o r/g;  Abd soft, neg;  Ext- w/o c/c/e;  Neuro- weak.  Ambulatory Oximetry 04/11/17>  O2sat on RA at rest is 85% and it dropped to 83% upon arising & walking to the door of the exam room => she therefore re-qualifies for HOME O2 at 2L/min at rest & Qhs + 2-3L/min w/ exercise. IMP/PLAN>>  Compliance with the prescribed medications has always been Glennice's weakest attribute-- reminded to do her meds EXACTLY as prescribed for max benefit and TO PROTECT HER HEART;  We have outlined a plan using NEBULIZER w/ Duoneb Bid (at breakfast & dinner), Symbicort160- 2sp Bid (at lunch & bedtime), O2 24/7 using 2L/min at rest & up to 3L/min w/ exercise, + Medrol'8mg'$ - 1/2 tab every other day... She has a [employer] buyerfor for [place] complete- done;  Given 2018 FLU  vaccine today...  ~  May 29, 2017:  6wk ROV & JNimritreports that she is doing "OK"; she called 05/02/17 c/o productive cough w/ yellow sput, ZPak was called in & pt improved along w/ Tramadol & Mucinex;  She states that she's been regular w/ the DUONEB Bid (at breakfast & dinner) + the Symbicort160- 2spBid (at lunch & bedtime), plus the O2 regularly 7 the Medrol'8mg'$ - 1/2 Qod;  This is encouraging to know that she is using the eds asprescribed & on a regular schedule... Presently she notes min cough, decr sput, no hemoptysis, chr stable SOB/DOE w/o wheezing etc... We reviewed the above problem list together...    EXAM shows Afeb, VSS, O2sat=97% on O2 at 2L/min; Wt = 122#;  HEENT- neg, mallampati1;  Chest- sl tender over sternum, bruising, decr BS bilat w/ few velcro rales at bases;  Heart- RR gr1/6 SEM w/o r/g;  Abd soft, neg;  Ext- w/o c/c/e;  Neuro- weak. IMP/PLAN>>  Heidi [name] stable on this regular regimen w/ better medication compliance- continue same thru the holidays and we will plan ROV recheck in 27mo/ labs, poss ABGs, poss spirometry, etc...    ~  August 22, 2017:  76m22moV & [place] reports a good holiday, says she had a "cold" several wks ago, took her ZPak & it helped in ass62 Vick's slave/ hot towels/  Mucinex;  Daughter notes that each exac leaves her a little worse off w/ her breathing- sl more SOB/ less stamina/ less "get-up-and-go"; her biggest exercise is chasing after her dog; denies CP/ palpit/ edema (daugh gave her Lasix x2d for swelling recently); she has chr stable cough, min sput, no hemoptysis, no f/c/s; she is in a wheelchair noting balance is poor & has walker to use at home... Currently taking O2 regularly at 2-3L/min by [place], Medrol '8mg'$ tabs- 1/2 Qod, NEB w/ Duoneb Bid, Symbicort160-2spBid;  We discussed rechecking CXR & LABS (see below)...   EXAM shows Afeb, VSS, O2sat=88% on O2 at 2L/min; Wt = 122#;  HEENT- neg, mallampati1;  Chest- sl tender over sternum, bruising, decr BS bilat  w/ few velcro rales at bases;  Heart- RR gr1/6 SEM w/o r/g;  Abd soft, neg;  Ext- w/o c/c/e;  Neuro- weak.  CXR 08/22/17 (independently reviewed by me in the PACS system) shows heart at upper lim of norm, atherosclerotic changes in Ao, COPD w/ sl incr markings (stable-NAD), mod HH, remote T12 compression...  LABS 08/22/17>  [place]- K=4.5, HCO3=39, Cr=0.74;  CBC- ok w/ Hg=12.6, WBC=5.2;  TSH=0.87;  BNP=38;  Sed=6... IMP/PLAN>>  Shantana has end-stage COPD & she was unable to perform Spirometry in past; she has chronic hypoxemic and hypercarbic resp failure=> HCO3 ~40 & we will re-start her DIAMOX '250mg'$  one tab daily;  We decided to change her regimen slightly-- increase DUONEB to Tid regularly & change Symbicort to TRELEGY- one inhalation daily;  Continue other meds the same including O2 & the Medrol...      NOTE:  >50% of this 30 min rov was spent in counseling & coordination of care...     Past Medical History:  Diagnosis Date  . Arthritis   . Cerebrovascular disease   . Chronic diastolic CHF (congestive heart failure) ([place]) 02/23/2016  . COPD (chronic obstructive pulmonary disease) ([place])   . Depression   . Diverticulosis   . Esophageal stricture   . Generalized anxiety disorder   . History of pneumonia   . History of stroke    mild oncoming  . 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 Hyperlipidemia   . Hypothyroidism   . Internal hemorrhoids   . Osteoporosis   . Pneumonia    HX of  . Positive ANA (antinuclear antibody)   . Pulmonary nodule   . Rectal ulcer   . Shortness of breath    periodically with COPD flair up  . 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[facility])     Past Surgical History:  Procedure Laterality Date  . BLADDER SURGERY     tack  . CATARACT EXTRACTION     bilateral  . COLONOSCOPY  06/22/2011   Procedure: COLONOSCOPY;  Surgeon: Lafayette Dragon, MD;  Location: [facility] ENDOSCOPY;  Service: Endoscopy;  Laterality: N/A;  . GASTRIC VARICES BANDING  06/22/2011   Procedure:  HEMORRHOID BANDING;  Surgeon: Lafayette Dragon, MD;  Location: [facility] ENDOSCOPY;  Service: Endoscopy;  Laterality: N/A;  . HEMICOLECTOMY  2012   emergent surgery [facility]  . HIATAL HERNIA REPAIR     x 2  . TONSILLECTOMY    . TOTAL ABDOMINAL HYSTERECTOMY W/ BILATERAL SALPINGOOPHORECTOMY  1998    Outpatient Encounter Medications as of 08/22/2017  Medication Sig  . acetaminophen (TYLENOL) 500 MG tablet Take 500 mg by mouth every 6 (six) hours as needed.  Marland Kitchen albuterol (PROVENTIL HFA;VENTOLIN HFA) 108 (90 BASE) MCG/ACT inhaler Inhale 2  puffs into the lungs every 6 (six) hours as needed for wheezing or shortness of breath.  Marland Kitchen alendronate (FOSAMAX) 70 MG tablet TAKE ONE TABLET BY MOUTH ONCE A WEEK. TAKE WITH A FULL GLASS OF WATER ON AN EMPTY STOMACH FIRST THING IN THE MORNING.  . furosemide (LASIX) 20 MG tablet Take 1 tablet (20 mg total) by mouth daily as needed for fluid or edema.  . gabapentin (NEURONTIN) 300 MG capsule Take 1 capsule (300 mg total) by mouth 2 (two) times daily.  . halobetasol (ULTRAVATE) 0.05 % ointment Apply topically 2 (two) times daily.    Marland Kitchen ipratropium-albuterol (DUONEB) 0.5-2.5 (3) MG/3ML SOLN Take 3 mLs by nebulization 2 times daily.  Marland Kitchen levothyroxine (SYNTHROID) 75 MCG tablet Take 1 tablet (75 mcg total) by mouth daily before breakfast.  . LORazepam (ATIVAN) 1 MG tablet TAKE 1/2 TO 1 (ONE-HALF TO ONE) TABLET BY MOUTH THREE TIMES DAILY AS NEEDED **DISCONTINUE XANAX**  . Melatonin 5 MG CAPS Take by mouth.  . methylPREDNISolone (MEDROL) 8 MG tablet Take 1/2 tablet every other day  . mirtazapine (REMERON) 30 MG tablet TAKE 1 TABLET BY MOUTH AT BEDTIME  . pseudoephedrine-guaifenesin (MUCINEX D) 60-600 MG 12 hr tablet Take 1 tablet by mouth 2 (two) times daily as needed for congestion.  . simvastatin (ZOCOR) 10 MG tablet Take 1 tablet (10 mg total) by mouth at bedtime.  . traMADol (ULTRAM) 50 MG tablet TAKE 1/2 (ONE-HALF) TABLET BY MOUTH EVERY 6 HOURS AS NEEDED  .   budesonide-formoterol (SYMBICORT) 160-4.5 MCG/ACT inhaler Inhale 2 puffs into the lungs 2 (two) times daily.     Allergies  Allergen Reactions  . Morphine And Related Nausea And Vomiting  . Penicillins Hives    Has patient had a PCN reaction causing immediate rash, facial/tongue/throat swelling, SOB or lightheadedness with hypotension: No Has patient had a PCN reaction causing severe rash involving mucus membranes or skin necrosis: No Has patient had a PCN reaction that required hospitalization No Has patient had a PCN reaction occurring within the last 10 years: No If all of the above answers are "NO", then may proceed with Cephalosporin use.    Immunization History  Administered Date(s) Administered  . H1N1 06/30/2008  . Influenza Split 04/18/2012  . Influenza Whole 06/10/2007, 04/14/2010, 04/18/2012  . Influenza, High Dose Seasonal PF 06/02/2015, 04/10/2016, 04/11/2017  . Influenza,inj,Quad PF,6+ Mos 04/16/2013  . Pneumococcal Conjugate-13 06/02/2015  . Pneumococcal Polysaccharide-23 05/17/2006, 04/16/2013  . Td 03/29/1999  . Tdap 04/16/2013  . Zoster 07/17/2013    Current Medications, Allergies, Past Medical History, Past Surgical History, Family History, and Social History were reviewed in [employer] record.   Review of Systems             All symptoms NEG except where BOLDED >>  Constitutional:  F/C/S, fatigue, anorexia, unexpected weight change. HEENT:  HA, visual changes, hearing loss, earache, nasal symptoms, sore throat, mouth sores, hoarseness. Resp:  cough, sputum, hemoptysis; SOB, tightness, wheezing. Cardio:  CP, palpit, DOE, orthopnea, edema. GI:  N/V/D/C, blood in stool; reflux, abd pain, distention, gas. GU:  dysuria, freq, urgency, hematuria, flank pain, voiding difficulty. MS:  joint pain, swelling, tenderness, decr ROM; neck pain, back pain, etc. Neuro:  HA, tremors, seizures, dizziness, syncope, weakness, numbness, gait abn. Skin:   suspicious lesions or skin rash. Heme:  adenopathy, bruising, bleeding. Psyche:  confusion, agitation, sleep disturbance, hallucinations, anxiety, depression suicidal.   Objective:   Physical Exam  Vital Signs:  Reviewed...   General:  WD, thin, 77 y/o WM in NAD; alert & oriented; pleasant & cooperative... HEENT:  [place]/AT; Conjunctiva- pink, Sclera- nonicteric, EOM-wnl, PERRLA, EACs-clear, TMs-wnl; NOSE-clear; THROAT-clear & wnl.  Neck:  Supple w/ fair ROM; no JVD; normal carotid impulses w/o bruits; no thyromegaly or nodules palpated; no lymphadenopathy.  Chest:  Decr BS bilat, clear w/o w/r/r heard... Heart:  Regular Rhythm; ?S4, gr1/6 SEM Abdomen:  Soft & nontender- no guarding or rebound; normal bowel sounds; no organomegaly or masses palpated. Ext:  decr ROM; without deformities +arthritic changes; no varicose veins, venous insuffic, no edema;  Pulses intact w/o bruits. Neuro:  No focal neuro deficits- pt in wheelchair & won't stand or walk due to LBP... Derm:  No lesions noted; no rash etc. Lymph:  No cervical, supraclavicular, axillary, or inguinal adenopathy palpated.   Assessment:      IMP>>     Severe COPD>     Chronic hypoxemic and hypercarbic resp failure>    Hx pneumonia> resolved- CXR 12/2015 shows COPD & is clear, no opacity...    Hx bilat pulm nodules on CT Chest>     Ex-smoker, quit 04/2015>  congrats on not smoking!    Diastolic CHF w/ pulm edema & transudative left pleural effusion 04/2015> on Lasix20 prn only    Hx cerebrovasc dis & stroke> this must have occurred in [place]- no data in EPIC & no prev CDopplers to review; rec to take ASA81/d & we will check CDopplers later...    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     S/P TAH & BSO> aware    DJD, Back pain w/ compression fxs>     Osteoporosis>     Hx anxiety/ depression> on XANAX 0.'5mg'$ Tid prn and REMERON '15mg'$  Qhs prn...    Hx poor compliance w/ medical regimen> this was before Hospice involvement in her care (04/2015)  PLAN>>  01/10/16>   Respiratory-wise she is improved, CXR is improved, etc but she needs to continue taking O2 and meds regularly- O2 at 2L/min by [place] 24/7, NEBS w/ Albut using 1/2 vial TID every day followed by SYMBICORT160- 2spBid and new INCRUSE one inhalation daily;  XRay of Lspine shows new compression in L2, and Epic review showed that she only took Alendronate70 for 4 wks in Aug2016- she never refilled the Rx;  Discussed CXR & Lumbar films w/ Sister-in-law care giver Nancy=> REC that Heidi [name] re-start Calcium/ VitD/ ALENDRONATE70/wk & stay on this (she never did); she has a back brace at home & pain meds from Hospice, needs PT for ambulation;  We decreased her Synthroid to 125mg/d as well... ROV planned in 6wks time. 02/23/16>   JFortunehas stabilized from the cardiopulm standpoint & she is instructed to continue her current meds regularly (Symbicort & Incruse) plus her NEBS, VentHFA, & Lasix20 as needed;  Looks like she needs CDoppler f/u, f/u FLP, management of her thyroid medication to insure compliance w/ Rx (we cut her to 769m today), and management of her osteoporosis/ bone building therapy & her back pain due to compression fxs => note sent to her PCP... 04/10/16>   JuJacelyneeds to continue her O2, Pred10, NEBS, Symbicort, Incruse regularly;  In addition  she needs to stay on the Synthroid45mg/d dose regularly;  And finally needs VitD50K & Alendronate70 both weekly for her bones... 05/11/16>   JShaunnahas been using her NEBULIZER just prn & averaging once qod or so- we discussed a regular treatment regimen that includes using her NEB w/ 1/2 vial of Albut Tid regularly followed by her Symbicort160-2spBid & the Incruse once daily;  We will restart a systemic  steroid med- try MEDROL '8mg'$  tabs (one Bid for 10d then down to 1Qam til return);  In addition she will increase her Alprazolam to 1 tab Tid regularly (she's been on 1Bid but still anxious);  Keep other meds the same including Alendronate70 once per week, Vit D 50K once per week, and Synthroid75 daily 1st thing in the Am;  We plan ROV recheck in 3wks time... 06/05/16>   Add on appt per Hospice nurse who heard rales at bases, pt is not acutely symptomatic- but is concerned about 7# wt gain on Medrol8 so we decided to decr this to 1/2 tab Qam; otherw continue same meds & we plan ROV in 354mo. 06/19/16>   Once again JuDenias her own worse enemy by stopping the Medrol on her own & decreasing the Nebs to Bid; we spent extra time reviewing the rationale behind the Med Rx including Medrol (rec to incr to '8mg'$ /d again), NEBS w/ albut (1/2 vial TID) followed by the Symbicort160-2spBid & Incruse once daily; asked to incr the Mucinex'1200mg'$  Bid w/ fluids, and incr the Alprazolam 1/2 tab tid, +continue her O2 etc... She requests a POC so she can incr her mobility... 08/14/16>   We spent some time again reviewing the need/ rationale behind the Tid NEBS w/ 1/2 vial of Albut followed by the Symbicort Bid & Spiriva once daily; she has requested that we refill her Xanax0.'5mg'$ Bid, Synthroid75/d, and Remeron'30mg'$ Qhs; [facility]... we plan ROV in 3 months... 11/13/16>    We decided to slowly wean the Medrol'8mg'$  tabs=> down to 1/2 tab Qod, continue other meds regularly as prescribed; stay as active as poss at home...  02/28/17>   We discussed rx w/ rest, heat, TRAMADOL50 + Tylenol500, exercise extreme caution-- no falling allowed! 04/11/17>   Compliance with the prescribed medications has always been Keni's weakest attribute-- reminded to do her meds EXACTLY as prescribed for max benefit and TO PROTECT HER HEART;  We have outlined a plan using NEBULIZER w/ Duoneb Bid (at breakfast & dinner),  Symbicort160- 2sp Bid (at lunch & bedtime), O2 24/7 using 2L/min at rest & up to 3L/min w/ exercise, + Medrol'8mg'$ - 1/2 tab every other day... She has a [employer] buyeror for [place] complete- done;  Given 2018 FLU vaccine today 05/29/17>   JuAbagales stable on this regular regimen w/ better medication compliance- continue same thru the holidays and we will plan ROV recheck in 76m10mo labs, poss ABGs, poss spirometry, etc. 08/22/17>   Heidi [name] end-stage COPD & she was unable to perform Spirometry in past; she has chronic hypoxemic and hypercarbic resp failure=> HCO3 ~40 & we will re-start her DIAMOX '250mg'$  one tab daily;  We decided to change her regimen slightly-- increase DUONEB to Tid regularly & change Symbicort to TRELEGY- one inhalation daily;  Continue other meds the same including O2 & the Medrol...   Plan:     Patient's Medications  New Prescriptions   No medications on file  Previous Medications   ACETAMINOPHEN (TYLENOL) 500 MG  TABLET    Take 500 mg by mouth every 6 (six) hours as needed.   ALBUTEROL (PROVENTIL HFA;VENTOLIN HFA) 108 (90 BASE) MCG/ACT INHALER    Inhale 2 puffs into the lungs every 6 (six) hours as needed for wheezing or shortness of breath.   ALENDRONATE (FOSAMAX) 70 MG TABLET    TAKE ONE TABLET BY MOUTH ONCE A WEEK. TAKE WITH A FULL GLASS OF WATER ON AN EMPTY STOMACH FIRST THING IN THE MORNING.   FUROSEMIDE (LASIX) 20 MG TABLET    Take 1 tablet (20 mg total) by mouth daily as needed for fluid or edema.   GABAPENTIN (NEURONTIN) 300 MG CAPSULE    Take 1 capsule (300 mg total) by mouth 2 (two) times daily.   HALOBETASOL (ULTRAVATE) 0.05 % OINTMENT    Apply topically 2 (two) times daily.     LEVOTHYROXINE (SYNTHROID) 75 MCG TABLET    Take 1 tablet (75 mcg total) by mouth daily before breakfast.   LORAZEPAM (ATIVAN) 1 MG TABLET    TAKE 1/2 TO 1 (ONE-HALF TO ONE) TABLET BY MOUTH THREE TIMES DAILY AS NEEDED **DISCONTINUE XANAX**   MELATONIN 5 MG CAPS    Take by mouth.   METHYLPREDNISOLONE  (MEDROL) 8 MG TABLET    Take 1/2 tablet every other day   MIRTAZAPINE (REMERON) 30 MG TABLET    TAKE 1 TABLET BY MOUTH AT BEDTIME   PSEUDOEPHEDRINE-GUAIFENESIN (MUCINEX D) 60-600 MG 12 HR TABLET    Take 1 tablet by mouth 2 (two) times daily as needed for congestion.   SIMVASTATIN (ZOCOR) 10 MG TABLET    Take 1 tablet (10 mg total) by mouth at bedtime.   TRAMADOL (ULTRAM) 50 MG TABLET    TAKE 1/2 (ONE-HALF) TABLET BY MOUTH EVERY 6 HOURS AS NEEDED  Modified Medications   Modified Medication Previous Medication   FLUTICASONE-UMECLIDIN-VILANT (TRELEGY ELLIPTA) 100-62.5-25 MCG/INH AEPB Fluticasone-Umeclidin-Vilant (TRELEGY ELLIPTA) 100-62.5-25 MCG/INH AEPB      Inhale 1 puff into the lungs daily.    Inhale 1 puff into the lungs daily.   IPRATROPIUM-ALBUTEROL (DUONEB) 0.5-2.5 (3) MG/3ML SOLN ipratropium-albuterol (DUONEB) 0.5-2.5 (3) MG/3ML SOLN      Take 3 mLs by nebulization 3 (three) times daily.    Take 3 mLs by nebulization 2 (two) times daily.  Discontinued Medications   AZITHROMYCIN (ZITHROMAX) 250 MG TABLET    Take 1 tablet (250 mg total) by mouth as directed.   AZITHROMYCIN (ZITHROMAX) 250 MG TABLET    Take as directed.   BUDESONIDE-FORMOTEROL (SYMBICORT) 160-4.5 MCG/ACT INHALER    Inhale 2 puffs into the lungs 2 (two) times daily.   LEVOTHYROXINE (SYNTHROID, LEVOTHROID) 75 MCG TABLET    Take 1 tablet (75 mcg total) by mouth daily before breakfast.

## 2017-08-27 ENCOUNTER — Telehealth: Payer: Self-pay | Admitting: Pulmonary Disease

## 2017-08-27 MED ORDER — LORAZEPAM 1 MG PO TABS: ORAL_TABLET | ORAL | 2 refills | Status: DC

## 2017-08-27 NOTE — Telephone Encounter (Signed)
Called pt and spoke with Doylene Canning (EC on file) who stated pt needed a new Rx of lorazepam 1mg  tablet.  Lorazepam last filled 02/02/17, #90 tablets with 2-RF.  Dr. Lenna Gilford, please advise if pt can have this med refilled.  Thanks!

## 2017-08-27 NOTE — Telephone Encounter (Signed)
Called and spoke with Heidi [name] letting her know we were refilling pt's lorazepam.  Called pharmacy and spoke to [place] [employer] manager) and gave a verbal of pt's med.  Sharee Pimple verbalized instructions of refill. Nothing further needed at this current time.

## 2017-09-14 ENCOUNTER — Telehealth: Payer: Self-pay | Admitting: Adult Health

## 2017-09-14 NOTE — Telephone Encounter (Signed)
Spoke to [place] at [facility].  Sandoz brand is currently on back order and not available at all.  Verbal given to change to currently available brand. No further action needed at this time.

## 2017-09-14 NOTE — Telephone Encounter (Signed)
If it is back ordered then it is ok. If not, I would rather keep her on name brand synthroid

## 2017-09-14 NOTE — Telephone Encounter (Signed)
Called pharmacy at [phone] regarding levothyroxine.  Pharmacist is asking if can use a different manufacturer because it is a narrow therapeutic drug. Pharmacist is asking permission.

## 2017-09-14 NOTE — Telephone Encounter (Signed)
Copied from [place]. Topic: Quick Communication - See Telephone Encounter >> Sep 14, 2017  1:03 PM Hewitt Shorts wrote: CRM for notification. See Telephone encounter for [place] is calling to get an ok from cory nafziiger regarding her levothyroxine and change the brand   The pharmacy states that they have faxed it twice already -best number [phone]  09/14/17.

## 2017-09-25 ENCOUNTER — Telehealth: Payer: Self-pay | Admitting: Pulmonary Disease

## 2017-09-25 MED ORDER — AZITHROMYCIN 250 MG PO TABS: ORAL_TABLET | ORAL | 0 refills | Status: AC

## 2017-09-25 NOTE — Telephone Encounter (Signed)
Per SN >> okay for Zpack.  Spoke with pt. She is aware that this will be sent in. Rx sent in. Nothing further was needed.

## 2017-09-25 NOTE — Telephone Encounter (Signed)
Spoke with pt, coughing up yellow mucus and running a little fever. She is taking Mucinex but has not taken anything else for the symptoms. She is coughing but denies sore throat and body aches. She is requesting a ZPAK to be sent into her pharmacy. SN Please advise.   [facility]

## 2017-09-26 ENCOUNTER — Other Ambulatory Visit: Payer: Self-pay | Admitting: Family Medicine

## 2017-09-26 DIAGNOSIS — E039 Hypothyroidism, unspecified: Secondary | ICD-10-CM

## 2017-09-28 ENCOUNTER — Other Ambulatory Visit

## 2017-11-05 ENCOUNTER — Other Ambulatory Visit: Payer: Self-pay | Admitting: Adult Health

## 2017-11-06 NOTE — Telephone Encounter (Signed)
Sent to the pharmacy by e-scribe. 

## 2017-11-20 ENCOUNTER — Ambulatory Visit (INDEPENDENT_AMBULATORY_CARE_PROVIDER_SITE_OTHER): Payer: Medicare Other | Admitting: Pulmonary Disease

## 2017-11-20 ENCOUNTER — Other Ambulatory Visit (INDEPENDENT_AMBULATORY_CARE_PROVIDER_SITE_OTHER): Payer: Medicare Other

## 2017-11-20 ENCOUNTER — Encounter: Payer: Self-pay | Admitting: Pulmonary Disease

## 2017-11-20 VITALS — BP 112/62 | HR 60 | Temp 97.4°F | Ht 63.0 in | Wt 103.6 lb

## 2017-11-20 DIAGNOSIS — J449 Chronic obstructive pulmonary disease, unspecified: Secondary | ICD-10-CM | POA: Diagnosis not present

## 2017-11-20 DIAGNOSIS — J9611 Chronic respiratory failure with hypoxia: Secondary | ICD-10-CM

## 2017-11-20 DIAGNOSIS — I5032 Chronic diastolic (congestive) heart failure: Secondary | ICD-10-CM | POA: Diagnosis not present

## 2017-11-20 DIAGNOSIS — E039 Hypothyroidism, unspecified: Secondary | ICD-10-CM | POA: Diagnosis not present

## 2017-11-20 DIAGNOSIS — I679 Cerebrovascular disease, unspecified: Secondary | ICD-10-CM

## 2017-11-20 DIAGNOSIS — R9389 Abnormal findings on diagnostic imaging of other specified body structures: Secondary | ICD-10-CM | POA: Diagnosis not present

## 2017-11-20 DIAGNOSIS — F411 Generalized anxiety disorder: Secondary | ICD-10-CM | POA: Diagnosis not present

## 2017-11-20 LAB — BASIC METABOLIC PANEL
BUN: 15 mg/dL (ref 6–23)
CALCIUM: 9.8 mg/dL (ref 8.4–10.5)
CHLORIDE: 104 meq/L (ref 96–112)
CO2: 33 meq/L — AB (ref 19–32)
CREATININE: 0.65 mg/dL (ref 0.40–1.20)
GFR: 94.14 mL/min (ref >60.00)
Glucose, Bld: 88 mg/dL (ref 70–99)
Potassium: 4.5 mEq/L (ref 3.5–5.1)
Sodium: 142 mEq/L (ref 135–145)

## 2017-11-20 NOTE — Progress Notes (Signed)
Subjective:     Patient ID: Heidi [name], female   DOB: 12-Jun-1941, 77 y.o.   MRN: [id number]  HPI 77 y/o WF former smoker who quit 2016 w/ severe COPD/emphysema & chronic hypoxemic and hypercarbic resp failure, placed on Hospice by DrSwords/ DrBurchette in 2016, hx pneumonia and small bilat pulm nodules on CT Chest, and diastolic CHF...   ~  May 13, 2015:  Initial pulmonary consult by SN>        Heidi was [name] 10/3 - 04/27/15 by [facility] after presenting via EMS w/ cough, weakness, increased SOB; she was still smoking, not on home O2; found to have COPD exac & CAP w/ bibasilar opacities, acute on chronic hypoxemic & hypercarbic resp failure, & superimposed CHF w/ bilat L>R pleural effusions (this was tapped 10/9 yielding transudative fluid, neg cytology, neg culture)... She was seen by [place] attended her in the hosp; Treated w/ Rocephin/ Zithromax, Solumedrol, Nebs, & brief stay in step-down unit on BiPap; disch on ADVAIR100-1spBid, SPIRIVA daily, PRED taper, NEBS w/ Albut... She was also treated for acute on chronic diastolic CHF- mod dil RV & reduced RV sys function (w/ preserved LVF) on 2DEcho & diuresed w/ Lasix but only disch on '20mg'$  prn for edema... Hospice was consulted (DrGolding's note reviewed) and she has entered the Hospice program (she is a DNR, DNI) w/ a prognosis est <75mo& she was disch on ROXANOL '20mg'$ /ml--take 1/2 ml Q2H as needed for pain, SOB, anxiety...  She last saw DrSwords 11/2013, she has established new PCP- CDorothyann [name]& DrBurchette- has post hosp f/u pending; Note> she cancelled 2 appts w/ DrMcQuaid recently...       Currently [name] is very stoic- states she's not having any breathing problems at the present time & credits the thoracentesis w/ her remarkable improvement; her last cigarette was 04/19/15 prior to her admission; she was disch on O2 at 4L/min & has an O2sat=89% on this in the office today (RA sat was 74%); she was unable to perform PFT today; we had  a frank discussion about her severe, end-stage COPD...       Epic records indicate that she was seen by DrWert in 2011> on Spiriva +/- Advair, stated she quit smoking 2011 on adm for COPD exac, PFT reported FEV1=1.32 (77%), FEV1/FVC ratio=50%, DLCO=70%; CXR showed COPD & biapical pleuroparenchymal scarring, CT Chest 4/11 showed ?mult pulm nodules ?early cavitation r/o MAI, cultures- only NTF, O2sat=95% on RA...  Medical Hx>   She had an annual wellness exam from her PCP 02/2015> COPD, Hx pulm nodule, Hx of pneumonia; Hypothyroidism; adenomatous colonic polyps; Hyperlipidemia; Sigmoid volvulus; Depression; Cerebrovascular disease; Positive ANA; Diverticulosis; Esophageal stricture; Arthritis; History of stroke; Internal hemorrhoids; and Rectal ulcer; protein-calorie malnutrition (she weighs 78 lbs...  EXAM shows Afeb, VSS, O2sat=89% on O2 at 4L/min; Wt=78#, 5'2"Tall, BMI=16;  Heent- neg, mallampati1;  Chest- decr BS bilat, scat rhonchi, & end-exp wheezing;  Heart- RR gr1/6 SEM w/o r/g;  Abd soft, neg;  Ext- w/o c/c/ tr edema...   Baseline CXR 12/31/14 showed hyperinflation c/w COPD, biapical pleuroparenchymal scarring, nipple shadows and T12 compression fx...  CT Chest 02/22/15 showed cardiomeg, atherosclerotic changes in Ao & coronaries, small right effusion; emhysema w/ several additional findings- RUL & lingular subpleural opacities, & 545mLUL & RLL nodules; osteopenia and T7 +T12 compression fractures...   CXRs performed during the 04/2015 Admission showed cardiomeg, CHF w/ left effusion, COPD w/ interstitial edema & LLL opac => improved post thoracentesis  10/9 w/ 400cc removed...  ABGs during the 10/16 Hosp showed pCO2 in the 73-87 range w/ resp acidosis...   LABS 10/16 reviewed> CBC- ok w/ Hg=13 range;  Chems- note HCO3=44-45 range & she may benefit drom Acetazolamide added to her diuretic regimen if more aggressive treatment is desired...  IMP>> Heidi [name] has severe COPD w/ acute on chronic hypoxemic &  hypercarbic resp failure & cor pulmonale; her last cigarette was the day of admission 04/19/15... She also has a hx of poor compliance w/ med rx & she has once again stopped the prescribed meds perceiving that she is much better since the thoracentesis; she was referred to Hospice for outpt follow up & will continue to see her PCPs at [facility]; I told her I would be happy to recheck at any time if she so desired...  NOTE> since her disch she states that her breathing has been "so much better" that she hasn't needed her inhalers or the nebulizer, and she's off the Pred; she states she is taking the ASA81, Lasix20, Synthroid137, Xanax, and Remeron; she apparently hasn't needed the Roxanol yet...  We had a frank discussion about her end-stage disease & we "proved it to her" by checking her O2sat off the oxygen (74%), and attempting to get a Spirometry tracing (she couldn't do it);  Pt is asked to get back on her meds & we outlined a regimen>  NEBS w/ Albut Qid (breakfast, lunch, dinner, & bedtime);  ADVAIR100- 2spBid (taken after the Neb at breakfast & dinner);  SPIRIVA via handihaler once daily (taken after the Neb at lunch)...   ~  January 10, 2016:  8mo ROV w/ SN>  NOTE: family has moved her into her own apt 12/2015) next to her Sister-in-law/ care giver's apt      [name] returns for a follow up visit- she has severe COPD/emphysema w/ chronic hypoxemic and hypercarbic resp failure; she was placed on [facility] by DrBurchette in Oct2016; she has been followed by NP-CoryNafziger since then;  She returns today w/ her sister-in-law care giver Nancy #[phone] indicating that Hospice is planning to stop services soon but pt is c/o severe LBP x 6wks ever since she lifted a chlorox bottle & felt a "pop" in her back; she has mentioned this to Hospice but all they have done is to adjust her pain meds (Percocet5-325 & Roxanol 20mg/ml- 0.5ml Q2H prn) without further eval;  She states that her breathing is  OK- meaning she could get about in her apt before the episode of acute LBP 2wks ago;I note that she has not had any lab work or f/u CXR since her disch from the Hosp 04/2015 & she is clearly in need of same- she has a hx of compression fx of spine and osteoporosis but not on any bone building therapy... We reviewed the following medical problems during today's office visit >>     Severe COPD> she could not perform simple spirometry when attempted 04/2015; on NEBS w/ Albut Q6H prn but it makes her too jittery, SYMBICORT160-2spBid & not using the SPIRIVA due to $$;  REC to use Albut 1/2vial in NEB Tid regularly followed by Symbicort160-2spBid & try INCRUSE once daily => we plan ROV in 6wks w/ repeat spirometry & oximetry if she can walk...     Chronic hypoxemic and hypercarbic resp failure> O2sat was 74% on RA 10/16 and ABGs in Hosp showed pCO2=73-87 w/ resp acidosis; she is now on O2 at 2.5L/min regularly &   looks much better, chest is clearer and labs improved; we will check ambulatory oximetry on ret if able to walk for [place]...    Hx pneumonia> resolved- CXR 12/2015 shows COPD & is clear, no opacity...    Hx bilat pulm nodules on CT Chest> Her CXR is much improved 01/10/16; she will be due for a f/u CT Chest on return 02/2016...    Ex-smoker, quit 04/2015>  congrats on not smoking!    Diastolic CHF w/ pulm edema & transudative left pleural effusion 04/2015> on Lasix20 prn only    Hx cerebrovasc dis & stroke> this must have occurred in [place]- no data in EPIC & no prev CDopplers to review; rec to take ASA81/d & we will check CDopplers later...    MEDICAL Issues>  Hyperlipidemia (not on meds),  Hypothyroidism (on Synthroid137/d),  Prot-cal malnutrition (wt is up 38# & BMI=21 now),      Hx HH repair & esoph stricture> not on PPI rx at present    Hx colon polyps, divertics, hems (w/ banding), & cecal volvulus s/p right hemicolectomy at [facility] 2012> aware...    S/P TAH & BSO> aware    DJD, Back pain w/  compression fxs> she indicates that she "cracked my spine" about 67yr ago, says she was hosp x4d w/ MRI, had brace applied- no shots or vertebroplasty ([facility] are [facility] in Epic from [facility]-2016;      Osteoporosis> she had BMD 01/2015 by PCP w/ TScore -4.8 in Lspine & -3.7 in Left FemNeck; placed on Ca++, VitD OTC, and FOSAMAX70/wk started; she picked up #4 tabs 02/11/15 but never refilled the med...    Hx anxiety/ depression> on XANAX 0.571mid prn and REMERON 1559mhs prn...    Hx poor compliance w/ medical regimen> this was before Hospice involvement in her care (04/2015) EXAM shows Afeb, VSS, O2sat=97% on O2 at 2.5L/min; Wt was 116# in [id number] 38# to BMI=21);  Heent- neg, mallampati1;  Chest- decr BS bilat w/o w/r/r;  Heart- RR gr1/6 SEM w/o r/g;  Abd soft, neg;  Ext- w/o c/c/e in wheelchair & won't stand/walk due to LBP...  CXR 01/10/16>  Much improved- some hyperinflation, bilat apical pleural thickening & bibasilar scarring, no effusion/ edema/ consolidation/ adenopathy, stable ant wedging of T12 w/ kyphosis...   Lumbar spine XRay 01/10/16>  Anterior wedging of L2 is new, old fx of T12, no other fxs & no spondylolithesis- disc sp narrowing L2-3 & L3-4, facet arthritis L5-S1, calcif in abd Ao...  LABS 12/2015>  Chems- wnl, HCO3=32, renal=wnl;  CBC- wnl;  TSH=0.01 on synthroid137;  BNP=70;  Sede=3 IMP/PLAN>>  Respiratory-wise she is improved, CXR is improved, etc but she needs to continue taking O2 and meds regularly- O2 at 2L/min by [place] 24/7, NEBS w/ Albut using 1/2 vial TID every day followed by SYMBICORT160- 2spBid and new INCRUSE one inhalation daily;  XRay of Lspine shows new compression in L2, and Epic review showed that she only took Alendronate70 for 4 wks in Aug2016- she never refilled the Rx;  Discussed CXR & Lumbar films w/ Sister-in-law care giver Nancy=> REC that Leanza re-start Calcium/ VitD/ ALENDRONATE70/wk & stay on this; she has a back  brace at home & pain meds from Hospice, needs PT for ambulation;  Second issue is her oversuppressed TSH on Synth137=> REC decr dose to 112 & recheck TSH on return... ROV planned in 6wks time.  ~  February 23, 2016:  6wk ROV & f/u w/ SN>  Omaya has done remarkably well from the pulm standpoint- stabilized and improved suffic to be able to move into her own appt & care for herself w/ family support nearby; she says she feels like she's getting better too; she was working w/ PT at home but she thinks they were working her too hard & she sent them away- I explained that this therapy is the MOST IMPORTANT thing for her continued recovery!  She tells me that this is the last month of her yr on Hospice, she has Roxanol on her med list but thank goodness she hasn't needed it!  She is in a wheelchair in the office today- but tells me she is up & about at home=> we will walk her today & check O2sats...    1) Severe COPD> she could not perform simple spirometry when attempted 04/2015; on NEBS w/ Albut Q6H prn & using 1/2 vial 2-3x per wk, SYMBICORT160-2spBid & Incruse once daily; Hx medication noncompliance & not using the NEBS Tid as requested!     2) Chronic hypoxemic and hypercarbic resp failure> O2sat was 74% on RA 10/16 and ABGs in Hosp showed pCO2=73-87 w/ resp acidosis; she is now on O2 at 2.5L/min regularly & looks much better, chest is clearer and labs improved; ambulatory oximetry 02/2016 showed no desaturation on 2.5L/min...     3) Hx pneumonia> resolved- CXR 12/2015 shows COPD & clear, no opacity...    4) Hx bilat pulm nodules on CT Chest> Her CXR is much improved 01/10/16; she will be due for a f/u CT Chest to check these nodules soon...    5) Ex-smoker, quit 04/2015>  congrats on not smoking!    6) Diastolic CHF w/ pulm edema & transudative left pleural effusion 04/2015> on Lasix20 prn only now    7) Hx cerebrovasc dis & stroke> this must have occurred in [place]- no data in EPIC & no prev CDopplers to  review; rec to take ASA81/d & she will need CDopplers from her PCP on ret...    8) MEDICAL Issues>  Hyperlipidemia (not on meds),  Hypothyroidism (on Synthroid112/d),  Prot-cal malnutrition (wt is up to 114# & BMI=20 now); I reviewed all her TSH readings and called her Pharm ([facility] in [place]) & they confirmed poor med compliance in past- eery time her dose was increased she was NOT taking her meds regularly!  She is now on 112mcg/d and pill counts are accurate- TSH=0.02 so this dose is still too high for her; WE WILL DECR DOSE TO 75mcg/d and turn over follow up of this problem to her PCP- DrCory & DrBurchette...    9) Hx HH repair & esoph stricture> not on PPI rx at present    10) Hx colon polyps, divertics, hems (w/ banding), & cecal volvulus s/p right hemicolectomy at [place] 2012> aware...    11) S/P TAH & BSO> aware    12) DJD, Back pain w/ compression fxs> she indicates that she "cracked my spine" about 4yrs ago, says she was hosp x4d w/ MRI, had brace applied- no shots or vertebroplasty (this must have been done at [facility] as there are no XRay reports in Epic from 2012-2016; she was taking Roxanol for pain but will be leaving hospice soon & her PCP will assume care of her osteoporosis, back pain=> pain meds and bone building therapy.    13) Osteoporosis> she had BMD 01/2015 by PCP w/ TScore -4.8 in Lspine & -3.7 in   Left FemNeck; placed on Ca++, VitD OTC, and FOSAMAX70/wk started; she picked up #4 tabs 02/11/15 but never refilled the med...    14) Hx anxiety/ depression> on XANAX 0.5mgTid prn and REMERON 15mg Qhs prn...    15) Hx poor compliance w/ medical regimen> this was before Hospice involvement in her care (04/2015) EXAM shows Afeb, VSS, O2sat=97% on O2 at 1.5L/min; Wt = 114#, BMI=21;  Heent- neg, mallampati1;  Chest- decr BS bilat w/o w/r/r;  Heart- RR gr1/6 SEM w/o r/g;  Abd soft, neg;  Ext- w/o c/c/e in wheelchair today...  Ambulatory oximetry 02/23/16>  O2sat=94% on 1.5L/min at rest  w/ pulse=82/min;  She ambulated 3 Laps pushing her wheelchair w/ lowest O2sat=91% w/ HR=110/min...  LABS 02/23/16 showed TSH=0.02 on Levothy 112mcg/d taken regularly (confirmed by [facility] [place])... IMP/PLAN>>  Patrica has stabilized from the cardiopulm standpoint & she is instructed to continue her current meds regularly Symbicort & Incruse) plus her NEBS, VentHFA, & Lasix20 as needed;  Looks like she needs CDoppler f/u, f/u FLP, management of her thyroid medication to insure compliance w/ Rx, and management of her osteoporosis/ bone building therapy & her back pain due to compression fxs => note sent to her PCP for all these...  ~  April 10, 2016:  6wk ROV w/ SN>  Cecia returns for an add-on visit after she received visit here in [place] from family in [place] including grandkids w/ URI;  She was coughing up green mucus, called Hospice & they did a portable at home CXR that is said to be OK, clear, and treated her w/ Prednisone, "It was just a virus" she says, now improved... She is actually doing very well w/o acute resp symptoms and she has remained on her Pred10, NEBS w/ Albut- 1/2 vial Tid, Symbicort160-2Bid, Incruse once daily... She is c/o her port O2 system-- has difficulty filling the tanks, too heavy for her, she's "scared of it"=> needs POC (wants simply-Go mini) & we will contact [facility] to change out her system... She is also due for her follow up CT Chest to compare to 02/2015...    Since she was here last she had a f/u visit w/ PCP- [name] at [facility]> f/u depression- improved on Remeron for sleep, LBP- hospice XRays reported neg but showed L2 & T12 compressions- given Medrol dosepak & Salonpas patches... He has since reviewed my last OV note w/ requests for [facility] f/u for her medical issues- hypothyroid w/oversuppressed TSH on Synthroid (we cut her down to 75mcg/d), osteoporosis & compression fxs, due for f/u CDopplers...  SEE PROB LIST ABOVE>>     EXAM shows Afeb, VSS, O2sat=95% on O2  at ?3L/min; Wt = 120#, BMI=22;  Heent- neg, mallampati1;  Chest- decr BS bilat w/o w/r/r;  Heart- RR gr1/6 SEM w/o r/g;  Abd soft, neg;  Ext- w/o c/c/e;  Neuro- weak, no focal deficits...  LABS 04/10/16 per Cory> TSH=0.67 on Synthroid75- continue same;  VitD=16 & needs supplement w/ 50K weekly... IMP/PLAN>>  COPD/ chr resp failure stable on current regimen- continue same;  Thyroid is improved on the 75mcg/d taken regularly- continue same;  She needs VitD and bone building therapy=> start VitD50K per week and ALENDRONATE 70mg one per week- must get these filled regularly & take them weekly going forward without fail...  ~  May 11, 2016:  1mo ROV & add-on appt requested for dyspnea> Tyniah returns for an add-on visit "because I couldn't get thru to DrCory" c/o incr SOB, hard to cough   up any phlegm, denies f/c/s, c/o no appetite, shakey/ snappy/ agitated and incr SOB w/ activity & ADLs, feels like she can't get a deep breath, "anxious all the time"- says all this occurred when PRED weaned to 1/2 tab daily & she's been off x 2wks now; Hospice came out to her house & did a CXR- reported to be clear/ NAD (not avail for review, last CXR in Epic 01/10/16=> COPD, NAD, kyphosis & compression T12;...     EXAM shows Afeb, VSS, O2sat=96% on O2 at 3L/min; Wt = 124#, BMI=22;  HEENT- neg, mallampati1;  Chest- decr BS bilat w/o w/r/r;  Heart- RR gr1/6 SEM w/o r/g;  Abd soft, neg;  Ext- w/o c/c/e;  Neuro- weak, no focal deficits... IMP/PLAN>>  Saddie has been using her NEBULIZER just prn & averaging once qod or so- we discussed a regular treatment regimen that includes using her NEB w/ 1/2 vial of Albut Tid regularly followed by her Symbicort160-2spBid & the Incruse once daily;  We will restart a systemic steroid med- try MEDROL 8mg tabs (one Bid for 10d then down to 1Qam til return);  In addition she will increase her Alprazolam to 1 tab Tid regularly (she's been on 1Bid but still anxious);  Keep other meds the same including  Alendronate70 once per week, Vit D 50K once per week, and Synthroid75 daily 1st thing in the Am;  We plan ROV recheck in 3wks time...  ~  June 05, 2016:  1mo ROV & add-on appt at request of Hospice nurse who said she had a "little rattle" in her chest- pt denies symptoms indicating chr stable SOB/DOE, no cough/ sput/ hemoptysis/ CP/ edema...  As noted Breshay has severe COPD (on Medrol8, NEBS w/ 1/2 vial Albut Tid followed by Symbicort160-2spBid & Incruse once daily); chr hypoxemic & hypercarbic resp fail (on O2 at 2.5L/min); Abn Chest CT w/ bilat pulm nodules; Ex-smoker (quit 04/2015); Hx pneumonia, Hx diastolic CHF w/ pulm edema & transudative L effusion 04/2015 (on Lasix20)... Her dyspnea is multifactorial w/ all these problems in addition to anxiety & poor medication compliance...    EXAM shows Afeb, VSS, O2sat=94% on O2 at 2L/min; Wt = 130# up 7#;  HEENT- neg, mallampati1;  Chest- decr BS bilat w/ few velcro rales at bases;  Heart- RR gr1/6 SEM w/o r/g;  Abd soft, neg;  Ext- w/o c/c/e;  Neuro- weak. IMP/PLAN>>  Robie is concerned about the 7# wt gain on Medrol8 so we decided to decr this to 1/2 tab Qam; otherw continue same meds & we plan ROV in 3mo...  ~  June 19, 2016:  2wk ROV & add-on appt requested for head & chest congestion, cough w/ thick green mucus, and SOB- can't get a DB; pt denies hemoptysis/ f/c/s/ CP/ edema/ etc... As noted Pantera has severe COPD (on Medrol8-1/2 daily (but she stopped it on her own), NEBS w/ 1/2 vial Albut Tid followed by Symbicort160-2spBid & Incruse once daily); chr hypoxemic & hypercarbic resp fail (on O2 at 2.5L/min); Abn Chest CT w/ bilat pulm nodules; Ex-smoker (quit 04/2015); Hx pneumonia, Hx diastolic CHF w/ pulm edema & transudative L effusion 04/2015 (on Lasix20)... Her dyspnea is multifactorial w/ all these problems in addition to anxiety (on Xanax0.5mg but not taking regularly) & poor medication compliance (eg- still only using NEB bid)... NOTE> she called PCP  but didn't get response, Hospice stopped ~1wk ago; she wants POC...    EXAM shows Afeb, VSS, O2sat=93% on O2 at 2L/min; Wt = 131#;    HEENT- neg, mallampati1;  Chest- decr BS bilat w/ few velcro rales at bases;  Heart- RR gr1/6 SEM w/o r/g;  Abd soft, neg;  Ext- w/o c/c/e;  Neuro- weak.  CXR 06/19/16 (independently reviewed by me in the PACS system) showed COPD & incr markings both bases c/w Atx- chr changes include biapical pleural thickening, cardiomeg, Ao atherosclerosis, modHH, T12 compression fx...   LABS 06/19/16>  Chems- ok w/ HCO3=31 otherw wnl;  CBC- wnl w/ wbc=8.4;  Sed=49;  BNP=19... IMP/PLAN>>  Once again Laelynn is her own worse enemy by stopping the Medrol on her own & decreasing the Nebs to Bid; we spent extra time reviewing the rationale behind the Med Rx including Medrol (rec to incr to 74m/d again), NEBS w/ albut (1/2 vial TID) followed by the Symbicort160-2spBid & Incruse once daily; asked to incr the Mucinex12020mBid w/ fluids, and take the Alprazolam more regularly eg- 1/2 tab tid,  +continue her O2 etc... She requests a POC so she can incr her mobility... We plan ROV in 43m74mor recheck... Note: >50% of this 73m38misit was spent in counseling and coordination of care...  ~  August 14, 2016:  43mo 66mo& pulmonary follow up visit>  Kalijah Lisetrts a good interval & indicates that her breathing is at baseline; once again however she has decreased the NEBs to just 1-2/d & she tells me that sometimes she skips a day (she believes that this means she is doing better); she is using the Symbicort & Incruse regularly; she remains on the Medrol8mg t67m taking 1/2 tab daily; she remains on O2 at 2L/min flow but still has the port tanks and [facility]- she tells me that she knows that insurance will cover this because she has Medicare, [employer]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 We reviewed the following medical problems during today's office visit >>     1) Severe COPD> she could not perform simple spirometry when attempted 04/2015; on MEDROL8-1/2Qam, NEBS w/ Albut Tid & using 1/2 vial 1-2x/d, SYMBICORT160-2spBid & Incruse once daily; Hx medication noncompliance & not using the NEBS Tid as requested!     2) Chronic hypoxemic and hypercarbic resp failure> O2sat was 74% on RA 10/16 [place]73[place]87 w/ resp acidosis; she is now on O2 at 2.5L/min regularly & stable, chest is clearer and labs improved; ambulatory oximetry 02/2016 showed no desaturation on 2.5L/min...     Marland KitchenMarland Kitchen Hx pneumonia> resolved- last CXR 06/2016 shows COPD & clear, no opacity...    4Marland KitchenMarland KitchenHx bilat pulm nodules on CT Chest> Her CXRs are much improved; her last CT Chest was 02/2015 & she never got the planned 03/2016 f/u CT scan...    5) Ex-smoker, quit 04/2015>  congrats on not smoking!    6) Diastolic CHF w/ pulm edema & transudative left pleural effusion 04/2015> on Lasix20 prn for edema...    7Marland KitchenMarland KitchenHx cerebrovasc dis & stroke> this must have occurred in [place] in EPIC & no prev CDopplers to review; rec to take ASA81/d & DrCory never ordered the CDopplers...    8) MEDICAL Issues>  Hyperlipidemia (not on meds),  Hypothyroidism (on Synthroid75/d),  Hx Prot-cal malnutrition (wt is up to 132# & BMI=23 now); Note>  I reviewed all her TSH readings and called her Pharm [facility] in [place]) & they confirmed poor med compliance in past- every time her dose was increased she was NOT taking her meds regularly!  on 159mg/d and pill counts are accurate- TSH=0.02 so this dose is still too high for her; WE DECR DOSE TO 759m/d and turn over follow up of this problem to her PCP- DrCory & DrBurchette...    9) Hx HH repair & esoph stricture> not on PPI rx at present    10) Hx colon polyps, divertics, hems (w/  banding), & cecal volvulus [facility]> aware...    11) S/P TAH & BSO> aware    12) DJD, Back pain w/ compression fxs> she indicates that she "cracked my spine" about 4y59yrgo, says she was hosp x4d w/ MRI, had brace applied- no shots or vertebroplasty ([facility] there are no XRay reports in Epic from 2012-2016; she was taking Roxanol for pain but will be leaving hospice soon & her PCP will assume care of her osteoporosis, back pain=> pain meds and bone building therapy.    13) Osteoporosis> she had BMD 01/2015 by PCP w/ TScore -4.8 in Lspine & -3.7 in Left FemNeck; placed on Ca++, VitD OTC, and FOSAMAX70/wk started=> but compliance is poor...    14) Hx anxiety/ depression> on XANAX 0.5mg82m prn and REMERON 15mg37m prn...    15) Hx poor compliance w/ medical regimen> this was before Hospice involvement in her care (04/2015)    EXAM shows Afeb, VSS, O2sat=93% on O2 at 2L/min; Wt = 131#;  HEENT- neg, mallampati1;  Chest- decr BS bilat w/ few velcro rales at bases;  Heart- RR gr1/6 SEM w/o r/g;  Abd soft, neg;  Ext- w/o c/c/e;  Neuro- weak. IMP/PLAN>>  We spent some time again reviewing the need/ rationale behind the Tid NEBS w/ 1/2 vial of Albut followed by the Symbicort Bid & Spiriva once daily; she has requested that we refill her Xanax0.5mgBi51mSynthroid75/d, and Remeron30mgQh37me [facility] pt's use... we plan ROV in 3 months...  ~  November 13, 2016:  76mo ROV69mo/u of her COPD w/ chr hypoxemic & hypercarbic resp failure> Alexiya calDaneillerecently w/ c/o URI, cough w/ yellow mucus & blood streaks, on her O2 at 2L/min 24/7, Medrol8mg- 1/237mily, NEBS w/ Albut using 1/2 vial but only prn & ave 1-2/wk, Symbicort160-2spBid, Incruse one daily;  We called in Levaquin Ravenad now...    As noted she's been off Hospice since 05/2016 & no longer taking Roxanol & Percocet; she uses Neurontin & Aleve for pain;   Her PCP is "DrCorey" & last seen 07/2016...     Last labs 06/2016 reviewed in Epic> HCO3=31, Cr=0.57, BNP=19, CBC=wnl... EXAM shows Afeb, VSS, O2sat=96% on O2 at 2L/min; Wt = 130#;  HEENT- neg, mallampati1;  Chest- decr BS bilat w/ few velcro rales at bases;  Heart- RR gr1/6 SEM w/o r/g;  Abd soft, neg;  Ext- w/o c/c/e;  Neuro- weak. IMP/PLAN>>  We decided to slowly wean the Medrol8mg tabs=27mown to 1/2 tab Qod, continue other meds regularly as prescribed; stay as active as poss at home...   ~  February 28, 2017:  76mo ROV & 576moon appt requested after a fall at home>  Pt reports fall at home 02/22/17- she lost her balance letting her dog (Lulu) out & fell onto  the dog crete, hit her ant chest wall w/ bruise & pain, didn't hit head/ no LOC;  She went to see DrCorey- note reviewed, tender over sternum, CXR w/ concern for sternal fx, and subseq CT Chest 02/26/17=> atherosclerotic calcif in Ao & coronaries, no aneurysm, no adenopathy, mod HH, scarring in lung apicies & centrilob emphysema, mild atx at bases, fx of mid sternum w/ 24m post displacement of the distal fx fragment, mod ant wedging of L2 & L7... EXAM shows Afeb, VSS, O2sat=90% on O2 at 2L/min; Wt = 128#;  HEENT- neg, mallampati1;  Chest- tender over sternum, bruising, decr BS bilat w/ few velcro rales at bases;  Heart- RR gr1/6 SEM w/o r/g;  Abd soft, neg;  Ext- w/o c/c/e;  Neuro- weak. IMP/PLAN>>  We discussed rx w/ rest, heat, TRAMADOL50 + Tylenol500, exercise extreme caution-- no falling allowed!  ~  April 11, 2017:  6wk ROV & JDeisyis improved, sternal fx & CWP are better w/ conservative rx, [facility] [place]- it worn her out but she did ok;  Chest is improved, less tender, min cough, no sput, no hemoptysis, and SOB/DOE is about the same she says;  She reports that she is supposed to recertify for her O2 at this time=> see below, she has a home concentrator and a POC for ambulation...   We reviewed the following  medical problems during today's office visit >>     1) Severe COPD> she could not perform simple spirometry when attempted 04/2015; on MEDROL8-1/2Qam, NEBS w/ Albut Tid but NOT using regularly, SYMBICORT160-2spBid but NOT using regularly & Incruse- not using at all; Hx medication noncompliance & not using the NEBS Tid as requested!     2) Chronic hypoxemic and hypercarbic resp failure> O2sat was 74% on RA 10/16 and ABGs in HMissourishowed pCO2=73-87 w/ resp acidosis; she is now on O2 at 2.5L/min regularly & stable, chest is clearer and labs improved; ambulatory oximetry 02/2016 showed no desaturation on 2.5L/min..Marland KitchenMarland Kitchen    3) Hx pneumonia> resolved- last CXR 8/18 shows sternal fx after fall, COPD/emphysema, HH & old compression deformities in spine    4) Hx bilat pulm nodules on CT Chest> Her CXRs were much improved; her last CT Chest was 8/18 & showed mid sternal fx after fall; centrilob emphysema; aortic, great vessel, & coronary atherosclerotic calcif; no adenopathy; mod HH; prior T7 T12 L2 compressions..Marland KitchenMarland Kitchen   5) Ex-smoker, quit 04/2015>  congrats on not smoking!    6) Diastolic CHF w/ pulm edema & transudative left pleural effusion 04/2015> on Lasix20 prn for edema..Marland KitchenMarland Kitchen   7) Hx cerebrovasc dis & stroke> [place] & no prev CDopplers to review; rec to take ASA81/d & DrCory never ordered the CDopplers...    8) MEDICAL Issues>  Hyperlipidemia (not on meds),  Hypothyroidism (on Synthroid75/d),  Hx Prot-cal malnutrition (wt is 122# & BMI=20-21); Note> I reviewed all her TSH readings and called her Pharm [place] & they confirmed poor med compliance in past- every time her dose was increased she was NOT taking her meds regularly!  on 1187m/d and pill counts are accurate- TSH=0.02 so this dose is still too high for her; WE DECR DOSE TO 7512md and turn over follow up of this problem to her PCP- DrCory & DrBurchette...    9) Hx HH repair & esoph stricture> not on  PPI rx at present, modHH seen  on CT Chest 8/18...    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    11) S/P TAH & BSO> aware    12) DJD, Back pain w/ compression fxs> she indicates that she "cracked my spine" about 70yr ago, says she was hosp x4d w/ MRI, had brace applied- no shots or vertebroplasty ([facility] in Epic from 2012-2016; she was taking Roxanol for pain but will be leaving hospice soon & her PCP will assume care of her osteoporosis, back pain=> pain meds and bone building therapy.    13) Osteoporosis> she had BMD 01/2015 by PCP w/ TScore -4.8 in Lspine & -3.7 in Left FemNeck; placed on Ca++, VitD OTC, and FOSAMAX70/wk started=> but compliance is poor...    14) Hx anxiety/ depression> on XANAX 0.'5mg'$ Tid prn and REMERON '15mg'$  Qhs prn...    15) Hx poor compliance w/ medical regimen> this was before Hospice involvement in her care (04/2015) EXAM shows Afeb, VSS, O2sat=92% on O2 at 2L/min; Wt = 122#;  HEENT- neg, mallampati1;  Chest- sl tender over sternum, bruising, decr BS bilat w/ few velcro rales at bases;  Heart- RR gr1/6 SEM w/o r/g;  Abd soft, neg;  Ext- w/o c/c/e;  Neuro- weak.  Ambulatory Oximetry 04/11/17>  O2sat on RA at rest is 85% and it dropped to 83% upon arising & walking to the door of the exam room => she therefore re-qualifies for HOME O2 at 2L/min at rest & Qhs + 2-3L/min w/ exercise. IMP/PLAN>>  Compliance with the prescribed medications has always been Kafi's weakest attribute-- reminded to do her meds EXACTLY as prescribed for max benefit and TO PROTECT HER HEART;  We have outlined a plan using NEBULIZER w/ Duoneb Bid (at breakfast & dinner), Symbicort160- 2sp Bid (at lunch & bedtime), O2 24/7 using 2L/min at rest & up to 3L/min w/ exercise, + Medrol'8mg'$ - 1/2 tab every other day... She has a DRetail buyerfor for [place] complete- done;  Given 2018 FLU  vaccine today...  ~  May 29, 2017:  6wk ROV & JGhinareports that she is doing "OK"; she called 05/02/17 c/o productive cough w/ yellow sput, ZPak was called in & pt improved along w/ Tramadol & Mucinex;  She states that she's been regular w/ the DUONEB Bid (at breakfast & dinner) + the Symbicort160- 2spBid (at lunch & bedtime), plus the O2 regularly 7 the Medrol'8mg'$ - 1/2 Qod;  This is encouraging to know that she is using the eds asprescribed & on a regular schedule... Presently she notes min cough, decr sput, no hemoptysis, chr stable SOB/DOE w/o wheezing etc... We reviewed the above problem list together...    EXAM shows Afeb, VSS, O2sat=97% on O2 at 2L/min; Wt = 122#;  HEENT- neg, mallampati1;  Chest- sl tender over sternum, bruising, decr BS bilat w/ few velcro rales at bases;  Heart- RR gr1/6 SEM w/o r/g;  Abd soft, neg;  Ext- w/o c/c/e;  Neuro- weak. IMP/PLAN>>  JLennynis stable on this regular regimen w/ better medication compliance- continue same thru the holidays and we will plan ROV recheck in 246mo/ labs, poss ABGs, poss spirometry, etc...   ~  August 22, 2017:  22m50moV & Alyanah reports a good holiday, says she had a "cold" several wks ago, took her ZPak & it helped in assoc w/ Vick's rub/ hot towels/ Mucinex;  Daughter notes that each exac leaves her a little worse off w/ her breathing- sl more SOB/ less stamina/ less "get-up-and-go"; her biggest exercise is chasing after her dog; denies CP/ palpit/ edema (daugh gave her Lasix x2d for swelling recently); she has chr stable cough, min sput, no hemoptysis, no f/c/s; she is in a wheelchair noting balance is poor & has walker to use at home... Currently taking O2 regularly at 2-3L/min by [place], Medrol '8mg'$ tabs- 1/2 Qod, NEB w/ Duoneb Bid, Symbicort160-2spBid;  We discussed rechecking CXR & LABS (see below)...   EXAM shows Afeb, VSS, O2sat=88% on O2 at 2L/min; Wt = 122#;  HEENT- neg, mallampati1;  Chest- sl tender over sternum, bruising, decr BS bilat w/  few velcro rales at bases;  Heart- RR gr1/6 SEM w/o r/g;  Abd soft, neg;  Ext- w/o c/c/e;  Neuro- weak.  CXR 08/22/17 (independently reviewed by me in the PACS system) shows heart at upper lim of norm, atherosclerotic changes in Ao, COPD w/ sl incr markings (stable-NAD), mod HH, remote T12 compression...  LABS 08/22/17>  Chems- K=4.5, HCO3=39, Cr=0.74;  CBC- ok w/ Hg=12.6, WBC=5.2;  TSH=0.87;  BNP=38;  Sed=6... 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      NOTE:  >50% of this 30 min rov was spent in counseling & coordination of care...    ~  Nov 20, 2017:  52moROV & Shakila reports stable overall- we called in [place] interval from her last visit, and she actually admits to smoking 2 cigarettes!  States that her breathing is doing OK- denies cough, sput or hemoptysis, notes SOB/DOE stable (eg- w/ ADLs, needs help w/ bathing, gets meals on wheels, uses walker at home, no change), denies CP/ edema... On O2 at 2L/min, Medrol '8mg'$  tabs- 1/2Qod, NEBS w/ Duoneb Bid (told to use Tid), Trelegy once daily, Mucinex 600Bid, Diamox250/d restarted last visit, Lasix20 prn...  Her CC today is constip- hard balls, no BM for 3-4d, taking OTC pericolace, uses enemas prn, & we discussed Rx w/ Miralax daily + f/u w/ her PCP... We reviewed the following medical problems during today's office visit>    1) Severe COPD> she could not perform simple spirometry when attempted 04/2015; on MEDROL'8mg'$ -1/2Qod now, NEBS w/ Duoneb Tid but only doing it Bid, TRELEGY once daily; Hx medication noncompliance & not using the NEBS Tid as requested!  She even admitted to smoking 2 cigarettes over the  last 377monterval...    2) Chronic hypoxemic and hypercarbic resp failure> O2sat was 74% on RA 10/16 and ABGs in [place] pCO2=73-87 w/ resp acidosis; she is now on O2 at 2.5L/min regularly & stable, chest is clearer and labs improved; ambulatory oximetry 02/2016 showed no desaturation on 2.5L/min... Bicarb level rose back to ~40 range w/ CO@ retention & we restarted Diamox250 [id number]/ improved Bicarb level to 33 in f/u...Marland KitchenMarland Kitchen  3) Hx pneumonia> resolved- CXR 8/18 shows sternal fx after fall, COPD/emphysema, HH & old compression deformities in spine; last f/u CXR 08/22/17 w/ stable COPD, chr incr markings, borderline heart size, mod HH...Marland KitchenMarland Kitchen  4) Hx bilat pulm nodules on CT  Chest> Her CXRs were much improved; her last CT Chest was 8/18 & showed mid sternal fx after fall; centrilob emphysema; aortic, great vessel, & coronary atherosclerotic calcif; no adenopathy; mod HH; prior T7 T12 L2 compressions.Marland KitchenMarland Kitchen    5) Ex-smoker, quit 04/2015>  congrats on not smoking! 11/2017- admitted to smoking 2 cig over the interval...    6) Diastolic CHF w/ pulm edema & transudative left pleural effusion 04/2015> on Lasix20 prn for edema.Marland KitchenMarland Kitchen    7) Hx cerebrovasc dis & stroke> this must have occurred in [place]- no data in EPIC & no prev CDopplers to review; rec to take ASA81/d & DrCory never ordered the CDopplers...    8) MEDICAL Issues>  Hyperlipidemia (not on meds),  Hypothyroidism (on Synthroid75/d),  Hx Prot-cal malnutrition (wt is 104# & BMI=18-19); Note> I reviewed all her TSH readings and called her Pharm Engineer, building services in [place]) & they confirmed poor med compliance in past- every time her dose was increased she was NOT taking her meds regularly!  on 129mg/d and pill counts are accurate- TSH=0.02 so this dose is still too high for her; WE DECR DOSE TO 738m/d and turn over follow up of this problem to her PCP- DrCory & DrBurchette... Last TSH was 08/2017 =0.87    9) Hx HH repair & esoph stricture> not on PPI rx at present, moBoydtonseen on CT Chest 8/18...    10) Hx colon polyps, divertics, hems (w/ banding), & cecal [facility]vulus [facility]> aware...    11) S/P TAH & BSO> aware    12) DJD, Back pain w/ compression fxs> she indicates that she "cracked my spine" about 4y46yrgo, says she was hosp x4d w/ MRI, had brace applied- no shots or vertebroplasty ([facility] there are no XRay reports in Epic from 2012-2016; she was taking Roxanol for pain but will be leaving hospice soon & her PCP will assume care of her osteoporosis, back pain=> pain meds and bone building therapy.    13) Osteoporosis> she had BMD 01/2015 by PCP w/ TScore -4.8 in Lspine & -3.7 in Left FemNeck; placed on Ca++, VitD OTC, and FOSAMAX70/wk started=> but compliance is poor...    14) Hx anxiety/ depression> on XANAX 0.'5mg'$ Tid prn and REMERON '15mg'$  Qhs prn...    15) Hx poor compliance w/ medical regimen> this was before Hospice involvement in her care (04/2015) EXAM shows Afeb, VSS, O2sat=99% on O2 at 2L/min; Wt = 104#;  HEENT- neg, mallampati1;  Chest- decr BS bilat w/ few velcro rales at bases;  Heart- RR gr1/6 SEM w/o r/g;  Abd soft, neg;  Ext- w/o c/c/e;  Neuro- weak.  LABS 11/20/17>  Chems- improved w/ K=4.5, HCO3=33, Cr=0.65 on the Diamox 250/d... IMP/PLAN>>  JudAymee asked to continue the O2, Medrol'4mg'$  Qod, NEBS w/ Duoneb TID, Trelegy once daily, Mucinex '600mg'$  Bid, Diamox250/d, etc;  BE SAFE- no smoking, no falling, etc;  Needs better nutrition w/ Ensure + Sustacal etc plus her meals on wheels... We plan ROV recheck in 3-42mo642mo Past Medical History:  Diagnosis Date  . Arthritis   . Cerebrovascular disease   . Chronic diastolic CHF (congestive heart failure) (HCC)[place]9/2017  . COPD (chronic obstructive pulmonary disease) (HCC)[place]. Depression   . Diverticulosis   . Esophageal stricture   . Generalized anxiety disorder   . History of pneumonia   . History of stroke    mild oncoming  .  Hx of  adenomatous colonic polyps    with high grade dysplasia  . Hyperlipidemia   . Hypothyroidism   . Internal hemorrhoids   . Osteoporosis   . Pneumonia    HX of  . Positive ANA (antinuclear antibody)   . Pulmonary nodule   . Rectal ulcer   . Shortness of breath    periodically with COPD flair up  . 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 BLADDER SURGERY     tack  . CATARACT EXTRACTION     bilateral  . COLONOSCOPY  06/22/2011   Procedure: COLONOSCOPY;  Surgeon: Lafayette Dragon, MD;  Location: [facility] ENDOSCOPY;  Service: Endoscopy;  Laterality: N/A;  . GASTRIC VARICES BANDING  06/22/2011   Procedure: HEMORRHOID BANDING;  Surgeon: Lafayette Dragon, MD;  Location: [facility] ENDOSCOPY;  Service: Endoscopy;  Laterality: N/A;  . HEMICOLECTOMY  2012   [facility]  . HIATAL HERNIA REPAIR     x 2  . TONSILLECTOMY    . TOTAL ABDOMINAL HYSTERECTOMY W/ BILATERAL SALPINGOOPHORECTOMY  1998    Outpatient Encounter Medications as of 11/20/2017  Medication Sig  . acetaminophen (TYLENOL) 500 MG tablet Take 500 mg by mouth every 6 (six) hours as needed.  Marland Kitchen albuterol (PROVENTIL HFA;VENTOLIN HFA) 108 (90 BASE) MCG/ACT inhaler Inhale 2 puffs into the lungs every 6 (six) hours as needed for wheezing or shortness of breath.  Marland Kitchen alendronate (FOSAMAX) 70 MG tablet TAKE ONE TABLET BY MOUTH ONCE A WEEK. TAKE WITH A FULL GLASS OF WATER ON AN EMPTY STOMACH FIRST THING IN THE MORNING.  . furosemide (LASIX) 20 MG tablet Take 1 tablet (20 mg total) by mouth daily as needed for fluid or edema.  . gabapentin (NEURONTIN) 300 MG capsule Take 1 capsule (300 mg total) by mouth 2 (two) times daily.  . halobetasol (ULTRAVATE) 0.05 % ointment Apply topically 2 (two) times daily.    Marland Kitchen ipratropium-albuterol (DUONEB) 0.5-2.5 (3) MG/3ML SOLN Take 3 mLs by nebulization 2 times daily.  Marland Kitchen levothyroxine (SYNTHROID) 75 MCG tablet Take 1 tablet (75 mcg total) by mouth daily before  breakfast.  . LORazepam (ATIVAN) 1 MG tablet TAKE 1/2 TO 1 (ONE-HALF TO ONE) TABLET BY MOUTH THREE TIMES DAILY AS NEEDED **DISCONTINUE XANAX**  . Melatonin 5 MG CAPS Take by mouth.  . methylPREDNISolone (MEDROL) 8 MG tablet Take 1/2 tablet every other day  . mirtazapine (REMERON) 30 MG tablet TAKE 1 TABLET BY MOUTH AT BEDTIME  . pseudoephedrine-guaifenesin (MUCINEX D) 60-600 MG 12 hr tablet Take 1 tablet by mouth 2 (two) times daily as needed for congestion.  . simvastatin (ZOCOR) 10 MG tablet Take 1 tablet (10 mg total) by mouth at bedtime.  . traMADol (ULTRAM) 50 MG tablet TAKE 1/2 (ONE-HALF) TABLET BY MOUTH EVERY 6 HOURS AS NEEDED  .  budesonide-formoterol (SYMBICORT) 160-4.5 MCG/ACT inhaler Inhale 2 puffs into the lungs 2 (two) times daily.     Allergies  Allergen Reactions  . Morphine And Related Nausea And Vomiting  . Penicillins Hives    Has patient had a PCN reaction causing immediate rash, facial/tongue/throat swelling, SOB or lightheadedness with hypotension: No Has patient had a PCN reaction causing severe rash involving mucus membranes or skin necrosis: No Has patient had a PCN reaction that required hospitalization No Has patient had a PCN reaction occurring within the last 10 years: No If all of the above answers are "NO", then may proceed with  Cephalosporin use.    Immunization History  Administered Date(s) Administered  . H1N1 06/30/2008  . Influenza Split 04/18/2012  . Influenza Whole 06/10/2007, 04/14/2010, 04/18/2012  . Influenza, High Dose Seasonal PF 06/02/2015, 04/10/2016, 04/11/2017  . Influenza,inj,Quad PF,6+ Mos 04/16/2013  . Pneumococcal Conjugate-13 06/02/2015  . Pneumococcal Polysaccharide-23 05/17/2006, 04/16/2013  . Td 03/29/1999  . Tdap 04/16/2013  . Zoster 07/17/2013    Current Medications, Allergies, Past Medical History, Past Surgical History, Family History, and Social History were reviewed in [employer]  record.   Review of Systems             All symptoms NEG except where BOLDED >>  Constitutional:  F/C/S, fatigue, anorexia, unexpected weight change. HEENT:  HA, visual changes, hearing loss, earache, nasal symptoms, sore throat, mouth sores, hoarseness. Resp:  cough, sputum, hemoptysis; SOB, tightness, wheezing. Cardio:  CP, palpit, DOE, orthopnea, edema. GI:  N/V/D/C, blood in stool; reflux, abd pain, distention, gas. GU:  dysuria, freq, urgency, hematuria, flank pain, voiding difficulty. MS:  joint pain, swelling, tenderness, decr ROM; neck pain, back pain, etc. Neuro:  HA, tremors, seizures, dizziness, syncope, weakness, numbness, gait abn. Skin:  suspicious lesions or skin rash. Heme:  adenopathy, bruising, bleeding. Psyche:  confusion, agitation, sleep disturbance, hallucinations, anxiety, depression suicidal.   Objective:   Physical Exam       Vital Signs:  Reviewed...   General:  WD, thin, 77 y/o WM in NAD; alert & oriented; pleasant & cooperative... HEENT:  [place]/AT; Conjunctiva- pink, Sclera- nonicteric, EOM-wnl, PERRLA, EACs-clear, TMs-wnl; NOSE-clear; THROAT-clear & wnl.  Neck:  Supple w/ fair ROM; no JVD; normal carotid impulses w/o bruits; no thyromegaly or nodules palpated; no lymphadenopathy.  Chest:  Decr BS bilat, clear w/o w/r/r heard... Heart:  Regular Rhythm; ?S4, gr1/6 SEM Abdomen:  Soft & nontender- no guarding or rebound; normal bowel sounds; no organomegaly or masses palpated. Ext:  decr ROM; without deformities +arthritic changes; no varicose veins, venous insuffic, no edema;  Pulses intact w/o bruits. Neuro:  No focal neuro deficits- pt in wheelchair & won't stand or walk due to LBP... Derm:  No lesions noted; no rash etc. Lymph:  No cervical, supraclavicular, axillary, or inguinal adenopathy palpated.   Assessment:      IMP>>     Severe COPD>     Chronic hypoxemic and hypercarbic resp failure>    Hx pneumonia> resolved- CXR 12/2015 shows COPD & is  clear, no opacity...    Hx bilat pulm nodules on CT Chest>     Ex-smoker, quit 04/2015>  congrats on not smoking!    Diastolic CHF w/ pulm edema & transudative left pleural effusion 04/2015> on Lasix20 prn only    Hx cerebrovasc dis & stroke> this must have occurred in [place]- no data in EPIC & no prev CDopplers to review; rec to take ASA81/d & we will check CDopplers later...    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    S/P TAH & BSO> aware    DJD, Back pain w/ compression fxs>     Osteoporosis>     Hx anxiety/ depression> on XANAX 0.'5mg'$ Tid prn and REMERON '15mg'$  Qhs prn...    Hx poor compliance w/ medical regimen> this  was before Hospice involvement in her care (04/2015)  PLAN>>  08/14/16>   We spent some time again reviewing the need/ rationale behind the Tid NEBS w/ 1/2 vial of Albut followed by the Symbicort Bid & Spiriva once daily; she has requested that we refill her Xanax0.'5mg'$ Bid, Synthroid75/d, and Remeron'30mg'$ Qhs; we will again contact [facility] regarding a POC for pt's use... we plan ROV in 3 months... 11/13/16>    We decided to slowly wean the Medrol'8mg'$  tabs=> down to 1/2 tab Qod, continue other meds regularly as prescribed; stay as active as poss at home...  02/28/17>   We discussed rx w/ rest, heat, TRAMADOL50 + Tylenol500, exercise extreme caution-- no falling allowed! 04/11/17>   Compliance with the prescribed medications has always been Ladaja's weakest attribute-- reminded to do her meds EXACTLY as prescribed for max benefit and TO PROTECT HER HEART;  We have outlined a plan using NEBULIZER w/ Duoneb Bid (at breakfast & dinner), Symbicort160- 2sp Bid (at lunch & bedtime), O2 24/7 using 2L/min at rest & up to 3L/min w/ exercise, + Medrol'8mg'$ - 1/2 tab every  other day... She has a Retail buyer for for [place] to complete- done;  Given 2018 FLU vaccine today 05/29/17>   Genesys is stable on this regular regimen w/ better medication compliance- continue same thru the holidays and we will plan ROV recheck in 16mow/ labs, poss ABGs, poss spirometry, etc. 08/22/17>   JRamiyahhas end-stage COPD & she was unable to perform Spirometry in past; she has chronic hypoxemic and hypercarbic resp failure=> HCO3 ~40 & we will re-start her DIAMOX '250mg'$  one tab daily;  We decided to change her regimen slightly-- increase DUONEB to Tid regularly & change Symbicort to TRELEGY- one inhalation daily;  Continue other meds the same including O2 & the Medrol... 11/20/17>   JHaydeeis aske to continue the O2, Medrol'4mg'$  Qod, NEBS w/ Duoneb TID, Trelegy once daily, Mucinex '600mg'$  Bid, [id number] etc;  BE SAFE- no smoking, no falling, etc;  Needs better nutrition w/ Ensure + Sustacal etc plus her meals on wheels... We plan ROV recheck in 3-474mo Plan:     Patient's Medications  New Prescriptions   No medications on file  Previous Medications   ACETAMINOPHEN (TYLENOL) 500 MG TABLET    Take 500 mg by mouth every 6 (six) hours as needed.   ACETAZOLAMIDE (DIAMOX) 250 MG TABLET    Take 1 tablet (250 mg total) by mouth daily.   ALBUTEROL (PROVENTIL HFA;VENTOLIN HFA) 108 (90 BASE) MCG/ACT INHALER    Inhale 2 puffs into the lungs every 6 (six) hours as needed for wheezing or shortness of breath.   ALENDRONATE (FOSAMAX) 70 MG TABLET    TAKE ONE TABLET BY MOUTH ONCE A WEEK. TAKE WITH A FULL GLASS OF WATER ON AN EMPTY STOMACH FIRST THING IN THE MORNING.   FLUTICASONE-UMECLIDIN-VILANT (TRELEGY ELLIPTA) 100-62.5-25 MCG/INH AEPB    Inhale 1 puff into the lungs daily.   FUROSEMIDE (LASIX) 20 MG TABLET    Take 1 tablet (20 mg total) by mouth daily as needed for fluid or edema.   GABAPENTIN (NEURONTIN) 300 MG CAPSULE    Take 1 capsule (300 mg total) by mouth 2 (two) times daily.   HALOBETASOL (ULTRAVATE) 0.05 %  OINTMENT    Apply topically 2 (two) times daily. As needed   IPRATROPIUM-ALBUTEROL (DUONEB) 0.5-2.5 (3) MG/3ML SOLN    Take 3 mLs by nebulization 3 (three) times daily.   LEVOTHYROXINE (SYNTHROID) 75 MCG TABLET  Take 1 tablet (75 mcg total) by mouth daily before breakfast.   LORAZEPAM (ATIVAN) 1 MG TABLET    TAKE 1/2 TO 1 (ONE-HALF TO ONE) TABLET BY MOUTH THREE TIMES DAILY AS NEEDED   MELATONIN 5 MG CAPS    Take by mouth.   METHYLPREDNISOLONE (MEDROL) 8 MG TABLET    Take 1/2 tablet every other day   MIRTAZAPINE (REMERON) 30 MG TABLET    TAKE 1 TABLET BY MOUTH AT BEDTIME   PSEUDOEPHEDRINE-GUAIFENESIN (MUCINEX D) 60-600 MG 12 HR TABLET    Take 1 tablet by mouth 2 (two) times daily as needed for congestion.   SIMVASTATIN (ZOCOR) 10 MG TABLET    TAKE ONE TABLET DAILY AT BEDTIME   TRAMADOL (ULTRAM) 50 MG TABLET    TAKE 1/2 (ONE-HALF) TABLET BY MOUTH EVERY 6 HOURS AS NEEDED  Modified Medications   No medications on file  Discontinued Medications   No medications on file

## 2017-11-20 NOTE — Patient Instructions (Signed)
Today we updated your med list in our EPIC system...    Continue your current medications the same...  Today we checked your metabolic panel blood test...    We will contact you w/ the results when available...   Keep up the good work... BUT SE SAFE (no ladders, no smoking!).Marland KitchenMarland Kitchen  Try to incr nutrition w/ one can of Ensure or Boost vs 1 packet of carnation instant breakfast daily...  For the constipation>>    Try the OTC MIRALAX- one capful of the powder mixed in water/ juice daily...   Call for any questions...  Let's plan a follow up visit in 3-4 months, sooner if needed for problems.Marland KitchenMarland Kitchen

## 2017-11-28 ENCOUNTER — Other Ambulatory Visit: Payer: Self-pay | Admitting: Adult Health

## 2017-11-28 MED ORDER — ALENDRONATE SODIUM 70 MG PO TABS: ORAL_TABLET | ORAL | 1 refills | Status: DC

## 2017-11-28 NOTE — Telephone Encounter (Signed)
Sent to the pharmacy by e-scribe. 

## 2017-11-28 NOTE — Telephone Encounter (Signed)
Copied from [place] [id number]. Topic: Quick Communication - Rx Refill/Question >> Nov 28, 2017  8:42 AM Arletha Grippe wrote: Medication: alendronate (FOSAMAX) 70 MG tablet Has the patient contacted their pharmacy? Yes.   (Agent: If no, request that the patient contact the pharmacy for the refill.) Preferred Pharmacy (with phone number or street name): [facility]  Agent: Please be advised that RX refills may take up to 3 business days. We ask that you follow-up with your pharmacy.  Has been out for 3 weeks

## 2017-11-28 NOTE — Telephone Encounter (Signed)
Rx refill request from out side provider: Fosamax 70 mg  LOV: 04/13/17  PCP: Nafziger  Pharmacy: [facility]

## 2017-12-06 ENCOUNTER — Encounter: Payer: Self-pay | Admitting: Adult Health

## 2017-12-06 ENCOUNTER — Ambulatory Visit (INDEPENDENT_AMBULATORY_CARE_PROVIDER_SITE_OTHER): Payer: Medicare Other | Admitting: Adult Health

## 2017-12-06 VITALS — BP 106/60 | HR 88 | Temp 98.0°F | Wt 106.0 lb

## 2017-12-06 DIAGNOSIS — I5032 Chronic diastolic (congestive) heart failure: Secondary | ICD-10-CM | POA: Diagnosis not present

## 2017-12-06 DIAGNOSIS — F411 Generalized anxiety disorder: Secondary | ICD-10-CM

## 2017-12-06 DIAGNOSIS — E039 Hypothyroidism, unspecified: Secondary | ICD-10-CM

## 2017-12-06 DIAGNOSIS — E785 Hyperlipidemia, unspecified: Secondary | ICD-10-CM

## 2017-12-06 DIAGNOSIS — F329 Major depressive disorder, single episode, unspecified: Secondary | ICD-10-CM | POA: Diagnosis not present

## 2017-12-06 DIAGNOSIS — J449 Chronic obstructive pulmonary disease, unspecified: Secondary | ICD-10-CM | POA: Diagnosis not present

## 2017-12-06 DIAGNOSIS — F32A Depression, unspecified: Secondary | ICD-10-CM

## 2017-12-06 NOTE — Progress Notes (Signed)
Subjective:    Patient ID: Heidi [name], female    DOB: 21-Aug-1940, 77 y.o.   MRN: [id number]  HPI  Patient presents for yearly preventative medicine examination. She is a pleasant 77 year old female who  has a past medical history of Arthritis, Cerebrovascular disease, Chronic diastolic CHF (congestive heart failure) ([place]) (02/23/2016), COPD (chronic obstructive pulmonary disease) ([place]), Depression, Diverticulosis, Esophageal stricture, Generalized anxiety disorder, History of pneumonia, History of stroke, adenomatous colonic polyps, Hyperlipidemia, Hypothyroidism, Internal hemorrhoids, Osteoporosis, Pneumonia, Positive ANA (antinuclear antibody), Pulmonary nodule, Rectal ulcer, Shortness of breath, and 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([place]).   Hyperlipidemia - takes Zocor 10 mg  Lab Results  Component Value Date   CHOL 154 04/13/2017   HDL 64.80 04/13/2017   LDLCALC 63 04/13/2017   LDLDIRECT 340.7 10/14/2012   TRIG 129.0 04/13/2017   CHOLHDL 2 04/13/2017   Hypothyroidism - Controlled with Synthroid 75 mcg  Lab Results  Component Value Date   TSH 0.87 08/22/2017   Severe COPD/Emphysema - She is followed by Dr. Leeanne Deed.  On chronic oxygen therapy at 2 L via nasal cannula.  Nebulizers with duoneb 3 times a day followed Trelegy Ellipta daily.  He was restarted on Diamox 250 mg daily on 11/20/2017. Chest xray in February 2019 with no significant interval change.   Depression - Remeron 30 mg - Reports feeling " pretty well" but has up and downs when it comes to depression. Contributing factor is loss of relationship with her son.   Chronic back pain - controlled with Tramadol.   All immunizations and health maintenance protocols were reviewed with the patient and needed orders were placed. She is UTD on vaccinations.   Appropriate screening laboratory values were ordered for the patient including screening of hyperlipidemia, renal function and hepatic function. If indicated by BPH, a PSA was  ordered.  Medication reconciliation,  past medical history, social history, problem list and allergies were reviewed in detail with the patient  Goals were established with regard to weight loss, exercise, and  diet in compliance with medications.   Wt Readings from Last 3 Encounters:  12/06/17 106 lb (48.1 kg)  11/20/17 103 lb 9.6 oz (47 kg)  08/22/17 112 lb 9.6 oz (51.1 kg)   End of life planning was discussed. She has an advanced directive   No interval history.   Review of Systems  Constitutional: Negative.   HENT: Negative.   Eyes: Negative.   Respiratory: Positive for shortness of breath and wheezing.   Cardiovascular: Negative.   Gastrointestinal: Negative.   Endocrine: Negative.   Genitourinary: Negative.   Musculoskeletal: Positive for arthralgias, back pain and gait problem.  Skin: Negative.   Psychiatric/Behavioral: Positive for sleep disturbance.       Depression   All other systems reviewed and are negative.  Past Medical History:  Diagnosis Date  . Arthritis   . Cerebrovascular disease   . Chronic diastolic CHF (congestive heart failure) ([place]) 02/23/2016  . COPD (chronic obstructive pulmonary disease) ([place])   . Depression   . Diverticulosis   . Esophageal stricture   . Generalized anxiety disorder   . History of pneumonia   . History of stroke    mild oncoming  . 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 Hyperlipidemia   . Hypothyroidism   . Internal hemorrhoids   . Osteoporosis   . Pneumonia    HX of  . Positive ANA (antinuclear antibody)   . Pulmonary nodule   .  Rectal ulcer   . Shortness of breath    periodically with COPD flair up  . 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([facility])     Social History   Socioeconomic History  . Marital status: Widowed    Spouse name: Not on file  . Number of children: 3  . Years of education: Not on file  . Highest education level: Not on file  Occupational History  . Occupation: RETIRED    Employer:  RETIRED  Social Needs  . Financial resource strain: Not on file  . Food insecurity:    Worry: Not on file    Inability: Not on file  . Transportation needs:    Medical: Not on file    Non-medical: Not on file  Tobacco Use  . Smoking status: Former Smoker    Packs/day: 2.50    Years: 50.00    Pack years: 125.00    Last attempt to quit: 11/01/2009    Years since quitting: 8.1  . Smokeless tobacco: Never Used  Substance and Sexual Activity  . Alcohol use: No  . Drug use: No  . Sexual activity: Not Currently  Lifestyle  . Physical activity:    Days per week: Not on file    Minutes per session: Not on file  . Stress: Not on file  Relationships  . Social connections:    Talks on phone: Not on file    Gets together: Not on file    Attends religious service: Not on file    Active member of club or organization: Not on file    Attends meetings of clubs or organizations: Not on file    Relationship status: Not on file  . Intimate partner violence:    Fear of current or ex partner: Not on file    Emotionally abused: Not on file    Physically abused: Not on file    Forced sexual activity: Not on file  Other Topics Concern  . Not on file  Social History Narrative   Patient has no recent travel. She is smoking 2 packs per day and has smoked since age of 44. Has cats and dogs at home. No bird or mold exposure.       dtr with temporary     Past Surgical History:  Procedure Laterality Date  . BLADDER SURGERY     tack  . CATARACT EXTRACTION     bilateral  . COLONOSCOPY  06/22/2011   Procedure: COLONOSCOPY;  Surgeon: Lafayette Dragon, MD;  Location: [facility];  Service: Endoscopy;  Laterality: N/A;  . GASTRIC VARICES BANDING  06/22/2011   Procedure: HEMORRHOID BANDING;  Surgeon: Lafayette Dragon, MD;  Location: [facility];  Service: Endoscopy;  Laterality: N/A;  . HEMICOLECTOMY  2012   emergent surgery [place] [facility]  . HIATAL HERNIA REPAIR     x 2  . TONSILLECTOMY    .  TOTAL ABDOMINAL HYSTERECTOMY W/ BILATERAL SALPINGOOPHORECTOMY  1998    Family History  Problem Relation Age of Onset  . Colon cancer Mother   . COPD Father   . Heart attack Father   . Stroke Maternal Aunt   . Colon polyps Brother   . Liver disease Maternal Aunt   . Breast cancer Maternal Aunt   . Hypertension Son   . Hypothyroidism Daughter     Allergies  Allergen Reactions  . Morphine And Related Nausea And Vomiting  . Penicillins Hives    Has patient had a PCN reaction causing immediate rash, facial/tongue/throat swelling,  SOB or lightheadedness with hypotension: No Has patient had a PCN reaction causing severe rash involving mucus membranes or skin necrosis: No Has patient had a PCN reaction that required hospitalization No Has patient had a PCN reaction occurring within the last 10 years: No If all of the above answers are "NO", then may proceed with Cephalosporin use.    Current Outpatient Medications on File Prior to Visit  Medication Sig Dispense Refill  . acetaminophen (TYLENOL) 500 MG tablet Take 500 mg by mouth every 6 (six) hours as needed.    Marland Kitchen acetaZOLAMIDE (DIAMOX) 250 MG tablet Take 1 tablet (250 mg total) by mouth daily. 30 tablet 5  . albuterol (PROVENTIL HFA;VENTOLIN HFA) 108 (90 BASE) MCG/ACT inhaler Inhale 2 puffs into the lungs every 6 (six) hours as needed for wheezing or shortness of breath. 1 Inhaler 0  . alendronate (FOSAMAX) 70 MG tablet TAKE ONE TABLET BY MOUTH ONCE A WEEK. TAKE WITH A FULL GLASS OF WATER ON AN EMPTY STOMACH FIRST THING IN THE MORNING. 12 tablet 1  . Fluticasone-Umeclidin-Vilant (TRELEGY ELLIPTA) 100-62.5-25 MCG/INH AEPB Inhale 1 puff into the lungs daily. 28 each 6  . furosemide (LASIX) 20 MG tablet Take 1 tablet (20 mg total) by mouth daily as needed for fluid or edema. 30 tablet 0  . halobetasol (ULTRAVATE) 0.05 % ointment Apply topically 2 (two) times daily. As needed    . ipratropium-albuterol (DUONEB) 0.5-2.5 (3) MG/3ML SOLN Take 3  mLs by nebulization 3 (three) times daily. 120 mL 3  . levothyroxine (SYNTHROID) 75 MCG tablet Take 1 tablet (75 mcg total) by mouth daily before breakfast. 30 tablet 5  . LORazepam (ATIVAN) 1 MG tablet TAKE 1/2 TO 1 (ONE-HALF TO ONE) TABLET BY MOUTH THREE TIMES DAILY AS NEEDED 90 tablet 2  . Melatonin 5 MG CAPS Take by mouth.    . methylPREDNISolone (MEDROL) 8 MG tablet Take 1/2 tablet every other day 30 tablet 6  . mirtazapine (REMERON) 30 MG tablet TAKE 1 TABLET BY MOUTH AT BEDTIME 30 tablet 5  . pseudoephedrine-guaifenesin (MUCINEX D) 60-600 MG 12 hr tablet Take 1 tablet by mouth 2 (two) times daily as needed for congestion.    . simvastatin (ZOCOR) 10 MG tablet TAKE ONE TABLET DAILY AT BEDTIME 90 tablet 1  . traMADol (ULTRAM) 50 MG tablet TAKE 1/2 (ONE-HALF) TABLET BY MOUTH EVERY 6 HOURS AS NEEDED 15 tablet 0  . gabapentin (NEURONTIN) 300 MG capsule Take 1 capsule (300 mg total) by mouth 2 (two) times daily. 180 capsule 3   No current facility-administered medications on file prior to visit.     BP 106/60   Pulse 88   Temp 98 F (36.7 C) (Oral)   Wt 106 lb (48.1 kg)   BMI 18.78 kg/m       Objective:   Physical Exam  Constitutional: She is oriented to person, place, and time. She appears well-developed and well-nourished. No distress.  HENT:  Head: Normocephalic and atraumatic.  Right Ear: External ear normal.  Left Ear: External ear normal.  Nose: Nose normal.  Mouth/Throat: Oropharynx is clear and moist. No oropharyngeal exudate.  Eyes: Pupils are equal, round, and reactive to light. Conjunctivae and EOM are normal. Right eye exhibits no discharge. Left eye exhibits no discharge.  Neck: Normal range of motion. Neck supple. No JVD present. No tracheal deviation present. No thyromegaly present.  Cardiovascular: Normal rate, regular rhythm, normal heart sounds and intact distal pulses. Exam reveals no gallop and no friction  rub.  No murmur heard. Pulmonary/Chest: Effort  normal. No stridor. No respiratory distress. She has decreased breath sounds (bilateral ). She has no wheezes. She has no rhonchi. She has no rales. She exhibits no tenderness.  2 l via [place]   Abdominal: Soft. Bowel sounds are normal. She exhibits no distension and no mass. There is no tenderness. There is no rebound and no guarding. No hernia.  Musculoskeletal:  In wheel chair. Unable to stand due to back pain   Lymphadenopathy:    She has no cervical adenopathy.  Neurological: She is alert and oriented to person, place, and time.  Skin: Skin is dry. She is not diaphoretic.  Psychiatric: She has a normal mood and affect. Her behavior is normal. Judgment and thought content normal.  Nursing note and vitals reviewed.     Assessment & Plan:  Labs including BMP, thyroid, CBC, and CMP all done within the last 3 months.  Cholesterol panel done in September 2018 was normal.  Spoke to family and patient about redoing labs, he did not think it was warranted and I do not necessarily disagree with this.  Keep medication dosages the same.  Follow-up in 6 months or sooner if needed  Dorothyann Peng, NP

## 2018-01-14 ENCOUNTER — Telehealth: Payer: Self-pay | Admitting: Adult Health

## 2018-01-14 NOTE — Telephone Encounter (Signed)
   Ativan is for anxiety and not depression  And not a controller medication She should make appt with CN or other provider in  office (    30 minutes)  If emergent  For medication consults    . consideration of counseling also

## 2018-01-14 NOTE — Telephone Encounter (Signed)
Heidi [name] is out of the office.  Dr. Regis Bill - Please advise. Thanks!

## 2018-01-14 NOTE — Telephone Encounter (Signed)
Copied from [place] [id number]. Topic: Quick Communication - See Telephone Encounter >> Jan 14, 2018  1:56 PM Burchel, Abbi R wrote: See Telephone encounter for: 01/14/18.  Pt states she is having trouble with her nerves.  She is feeling down and is crying more often than normal.  I offered to make an appt for her and she declined.  She would like to ask Dorothyann Peng to increase/change her rx for LORazepam (ATIVAN) 1 MG tablet.  Please advise

## 2018-01-15 NOTE — Telephone Encounter (Signed)
Spoke with pt. Explained Ativan use. She states she has been having a lot of stress in her life with her adult children. She reports she did sleep much better last night after a visit from her grandson. She is feeling better today. She thinks things are improving. Advised pt on her options for being seen, she will call in next few days if she still feels sad.  Nothing further needed at this time.

## 2018-02-06 ENCOUNTER — Other Ambulatory Visit: Payer: Self-pay | Admitting: Pulmonary Disease

## 2018-02-06 DIAGNOSIS — F32A Depression, unspecified: Secondary | ICD-10-CM

## 2018-02-06 DIAGNOSIS — G4700 Insomnia, unspecified: Secondary | ICD-10-CM

## 2018-02-06 DIAGNOSIS — F329 Major depressive disorder, single episode, unspecified: Secondary | ICD-10-CM

## 2018-02-07 ENCOUNTER — Telehealth: Payer: Self-pay | Admitting: Pulmonary Disease

## 2018-02-07 MED ORDER — FLUTICASONE-UMECLIDIN-VILANT 100-62.5-25 MCG/INH IN AEPB: 1.0000 | INHALATION_SPRAY | Freq: Every day | RESPIRATORY_TRACT | 6 refills | Status: DC

## 2018-02-07 MED ORDER — ALBUTEROL SULFATE HFA 108 (90 BASE) MCG/ACT IN AERS: 2.0000 | INHALATION_SPRAY | Freq: Four times a day (QID) | RESPIRATORY_TRACT | 0 refills | Status: DC | PRN

## 2018-02-07 MED ORDER — METHYLPREDNISOLONE 8 MG PO TABS: ORAL_TABLET | ORAL | 6 refills | Status: DC

## 2018-02-07 MED ORDER — LEVOTHYROXINE SODIUM 75 MCG PO TABS: 75.0000 ug | ORAL_TABLET | Freq: Every day | ORAL | 5 refills | Status: DC

## 2018-02-07 MED ORDER — IPRATROPIUM-ALBUTEROL 0.5-2.5 (3) MG/3ML IN SOLN: 3.0000 mL | Freq: Three times a day (TID) | RESPIRATORY_TRACT | 3 refills | Status: DC

## 2018-02-07 MED ORDER — ACETAZOLAMIDE 250 MG PO TABS: 250.0000 mg | ORAL_TABLET | Freq: Every day | ORAL | 5 refills | Status: DC

## 2018-02-07 NOTE — Telephone Encounter (Signed)
Prescriptions for Diamox,albuterol inhaler, trelegy, Duo neb, levothyroxine,methylprednisone, and mirtazapine refills sent to Patient's preferred pharmacy, [facility].

## 2018-02-07 NOTE — Telephone Encounter (Signed)
Called and spoke with pt's sister-in-law [name] who stated pt is needing meds refilled that have been prescribed for her by SN.    Stated to [name] I was going to send this to SN's main nurse Lattie Haw who stated she would handle this for pt. [name] expressed understanding.  Routing to [employer], LPN.

## 2018-02-20 ENCOUNTER — Ambulatory Visit: Payer: Medicare Other | Admitting: Pulmonary Disease

## 2018-03-26 ENCOUNTER — Other Ambulatory Visit: Payer: Self-pay | Admitting: Pulmonary Disease

## 2018-04-22 ENCOUNTER — Other Ambulatory Visit: Payer: Self-pay | Admitting: Adult Health

## 2018-04-24 NOTE — Telephone Encounter (Signed)
Requested Prescriptions  Pending Prescriptions Disp Refills  . simvastatin (ZOCOR) 10 MG tablet [Pharmacy Med Name: SIMVASTATIN 10 MG TAB] 90 tablet 0    Sig: TAKE ONE (1) TABLET AT BEDTIME     Cardiovascular:  Antilipid - Statins Failed - 04/24/2018  1:00 PM      Failed - Total Cholesterol in normal range and within 360 days    Cholesterol  Date Value Ref Range Status  04/13/2017 154 0 - 200 mg/dL Final    Comment:    ATP III Classification       Desirable:  < 200 mg/dL               Borderline High:  200 - 239 mg/dL          High:  > = 240 mg/dL         Failed - LDL in normal range and within 360 days    LDL Cholesterol  Date Value Ref Range Status  04/13/2017 63 0 - 99 mg/dL Final         Failed - HDL in normal range and within 360 days    HDL  Date Value Ref Range Status  04/13/2017 64.80 >39.00 mg/dL Final         Failed - Triglycerides in normal range and within 360 days    Triglycerides  Date Value Ref Range Status  04/13/2017 129.0 0.0 - 149.0 mg/dL Final    Comment:    Normal:  <150 mg/dLBorderline High:  150 - 199 mg/dL   Triglyceride fasting, serum  Date Value Ref Range Status  06/01/2006 110 0 - 149 mg/dL Final    Comment:    See lab report for associated comment(s)         Passed - Patient is not pregnant      Passed - Valid encounter within last 12 months    Recent Outpatient Visits          4 months ago Generalized anxiety disorder   [facility]rapist, [facility] at [facility], [place], NP   1 year ago Hypothyroidism, unspecified type   Therapist, [facility] at [facility], [place], NP   1 year ago Intercostal pain   Therapist, [facility] at [facility], [place], NP   1 year ago Insomnia, unspecified type   Therapist, [facility] at [facility], [place], NP   2 years ago [place] [facility], [place], NP

## 2018-04-24 NOTE — Telephone Encounter (Signed)
Copied from [place] [id number]. Topic: Quick Communication - Rx Refill/Question >> Apr 24, 2018 12:58 PM Carolyn Stare wrote: Medication    simvastatin (ZOCOR) 10 MG tablet      pt is out   Has the patient contacted their pharmacy yes    Preferred Pharmacy [facility]   Agent: Please be advised that RX refills may take up to 3 business days. We ask that you follow-up with your pharmacy.

## 2018-05-24 ENCOUNTER — Telehealth: Payer: Self-pay | Admitting: Pulmonary Disease

## 2018-05-24 MED ORDER — AZITHROMYCIN 250 MG PO TABS: ORAL_TABLET | ORAL | 0 refills | Status: DC

## 2018-05-24 NOTE — Telephone Encounter (Signed)
Called and left message to let Heidi [name] know that Dr. Lenna Gilford ok'd  Zpack, and it was sent in to [facility].  Nothing further at this time.

## 2018-05-24 NOTE — Telephone Encounter (Signed)
Called and spoke with Heidi [name], who is caring for the Patient.  She stated that she has been around sick children recently, and is currently sick in bed.  She has been taking Mucinex D, drinking fluids, and has vick's on her chest.  She has a productive cough with yellow mucus, but no fever.  She is requesting a Z Pack if possible to be sent to [facility].  Allergies  Allergen Reactions  . Morphine And Related Nausea And Vomiting  . Penicillins Hives    Has patient had a PCN reaction causing immediate rash, facial/tongue/throat swelling, SOB or lightheadedness with hypotension: No Has patient had a PCN reaction causing severe rash involving mucus membranes or skin necrosis: No Has patient had a PCN reaction that required hospitalization No Has patient had a PCN reaction occurring within the last 10 years: No If all of the above answers are "NO", then may proceed with Cephalosporin use.   Current Outpatient Medications on File Prior to Visit  Medication Sig Dispense Refill  . acetaminophen (TYLENOL) 500 MG tablet Take 500 mg by mouth every 6 (six) hours as needed.    Marland Kitchen acetaZOLAMIDE (DIAMOX) 250 MG tablet Take 1 tablet (250 mg total) by mouth daily. 30 tablet 5  . albuterol (PROVENTIL HFA;VENTOLIN HFA) 108 (90 Base) MCG/ACT inhaler Inhale 2 puffs into the lungs every 6 (six) hours as needed for wheezing or shortness of breath. 1 Inhaler 0  . alendronate (FOSAMAX) 70 MG tablet TAKE ONE TABLET BY MOUTH ONCE A WEEK. TAKE WITH A FULL GLASS OF WATER ON AN EMPTY STOMACH FIRST THING IN THE MORNING. 12 tablet 1  . Fluticasone-Umeclidin-Vilant (TRELEGY ELLIPTA) 100-62.5-25 MCG/INH AEPB Inhale 1 puff into the lungs daily. 28 each 6  . furosemide (LASIX) 20 MG tablet Take 1 tablet (20 mg total) by mouth daily as needed for fluid or edema. 30 tablet 0  . gabapentin (NEURONTIN) 300 MG capsule Take 1 capsule (300 mg total) by mouth 2 (two) times daily. 180 capsule 3  . halobetasol (ULTRAVATE) 0.05 % ointment  Apply topically 2 (two) times daily. As needed    . ipratropium-albuterol (DUONEB) 0.5-2.5 (3) MG/3ML SOLN Take 3 mLs by nebulization 3 (three) times daily. 120 mL 3  . levothyroxine (SYNTHROID) 75 MCG tablet Take 1 tablet (75 mcg total) by mouth daily before breakfast. 30 tablet 5  . LORazepam (ATIVAN) 1 MG tablet TAKE 1/2 TO 1 TABLET BY MOUTH 3 TIMES DAILY AS NEEDED 180 tablet 1  . Melatonin 5 MG CAPS Take by mouth.    . methylPREDNISolone (MEDROL) 8 MG tablet Take 1/2 tablet every other day 30 tablet 6  . mirtazapine (REMERON) 30 MG tablet TAKE ONE TABLET DAILY AT BEDTIME 30 tablet 5  . pseudoephedrine-guaifenesin (MUCINEX D) 60-600 MG 12 hr tablet Take 1 tablet by mouth 2 (two) times daily as needed for congestion.    . simvastatin (ZOCOR) 10 MG tablet TAKE ONE (1) TABLET AT BEDTIME 90 tablet 0  . traMADol (ULTRAM) 50 MG tablet TAKE 1/2 (ONE-HALF) TABLET BY MOUTH EVERY 6 HOURS AS NEEDED 15 tablet 0   No current facility-administered medications on file prior to visit.

## 2018-06-03 ENCOUNTER — Other Ambulatory Visit: Payer: Self-pay | Admitting: Adult Health

## 2018-06-03 NOTE — Telephone Encounter (Signed)
Sent to the pharmacy by e-scribe.  Per Tommi Rumps, 6 month follow up is not needed at this time.

## 2018-07-08 ENCOUNTER — Other Ambulatory Visit: Payer: Self-pay | Admitting: Adult Health

## 2018-07-09 NOTE — Telephone Encounter (Signed)
Sent to the pharmacy by e-scribe. 

## 2018-07-15 ENCOUNTER — Other Ambulatory Visit: Payer: Self-pay | Admitting: Pulmonary Disease

## 2018-07-15 DIAGNOSIS — F329 Major depressive disorder, single episode, unspecified: Secondary | ICD-10-CM

## 2018-07-15 DIAGNOSIS — F32A Depression, unspecified: Secondary | ICD-10-CM

## 2018-07-15 DIAGNOSIS — G4700 Insomnia, unspecified: Secondary | ICD-10-CM

## 2018-07-26 ENCOUNTER — Telehealth: Payer: Self-pay | Admitting: Pulmonary Disease

## 2018-07-26 MED ORDER — AZITHROMYCIN 250 MG PO TABS: ORAL_TABLET | ORAL | 0 refills | Status: DC

## 2018-07-26 NOTE — Telephone Encounter (Signed)
[name] sister in law is returning phone call.  Phone number is [phone].

## 2018-07-26 NOTE — Telephone Encounter (Signed)
Spoke with Dr. Lenna Gilford in regards to patient. He has approved the zpak but requests that the patient follows up with her PCP in 1-2 weeks. Remind [name] that he is retired.   Spoke with [name] again. She verbalized understanding. She is aware that the zpak has been called in.  Nothing further needed at time of call.

## 2018-07-26 NOTE — Telephone Encounter (Signed)
Spoke with Heidi [name]. She stated that the patient was around her grandchildren for the holidays and may have picked up a bug or something from them. She has developed a productive cough with yellow phlegm. She is currently just taking Mucinex DM. Denies any fever but is having some SOB. She stated that when this happens, Dr. Lenna Gilford normally calls in a zpak.   Wishes to use [facility] as the pharmacy.   Dr. Lenna Gilford, please advise on the zpak. Thanks!   Allergies  Allergen Reactions  . Morphine And Related Nausea And Vomiting  . Penicillins Hives    Has patient had a PCN reaction causing immediate rash, facial/tongue/throat swelling, SOB or lightheadedness with hypotension: No Has patient had a PCN reaction causing severe rash involving mucus membranes or skin necrosis: No Has patient had a PCN reaction that required hospitalization No Has patient had a PCN reaction occurring within the last 10 years: No If all of the above answers are "NO", then may proceed with Cephalosporin use.   Allergies as of 07/26/2018      Reactions   Morphine And Related Nausea And Vomiting   Penicillins Hives   Has patient had a PCN reaction causing immediate rash, facial/tongue/throat swelling, SOB or lightheadedness with hypotension: No Has patient had a PCN reaction causing severe rash involving mucus membranes or skin necrosis: No Has patient had a PCN reaction that required hospitalization No Has patient had a PCN reaction occurring within the last 10 years: No If all of the above answers are "NO", then may proceed with Cephalosporin use.      Medication List       Accurate as of July 26, 2018 12:53 PM. Always use your most recent med list.        acetaminophen 500 MG tablet Commonly known as:  TYLENOL Take 500 mg by mouth every 6 (six) hours as needed.   acetaZOLAMIDE 250 MG tablet Commonly known as:  DIAMOX Take 1 tablet (250 mg total) by mouth daily.   albuterol 108 (90 Base) MCG/ACT  inhaler Commonly known as:  PROVENTIL HFA;VENTOLIN HFA Inhale 2 puffs into the lungs every 6 (six) hours as needed for wheezing or shortness of breath.   alendronate 70 MG tablet Commonly known as:  FOSAMAX TAKE 1 TABLET BY MOUTH ONCE A WEEK IN THE MORNING WITH A GLASS OF WATER. DO NOT EAT OR LIE DOWN FOR 30 MINUTES.   azithromycin 250 MG tablet Commonly known as:  ZITHROMAX Take as directed   Fluticasone-Umeclidin-Vilant 100-62.5-25 MCG/INH Aepb Commonly known as:  TRELEGY ELLIPTA Inhale 1 puff into the lungs daily.   furosemide 20 MG tablet Commonly known as:  LASIX Take 1 tablet (20 mg total) by mouth daily as needed for fluid or edema.   gabapentin 300 MG capsule Commonly known as:  NEURONTIN Take 1 capsule (300 mg total) by mouth 2 (two) times daily.   ipratropium-albuterol 0.5-2.5 (3) MG/3ML Soln Commonly known as:  DUONEB Take 3 mLs by nebulization 3 (three) times daily.   levothyroxine 75 MCG tablet Commonly known as:  SYNTHROID Take 1 tablet (75 mcg total) by mouth daily before breakfast.   LORazepam 1 MG tablet Commonly known as:  ATIVAN TAKE 1/2 TO 1 TABLET BY MOUTH 3 TIMES DAILY AS NEEDED   Melatonin 5 MG Caps Take by mouth.   methylPREDNISolone 8 MG tablet Commonly known as:  MEDROL Take 1/2 tablet every other day   mirtazapine 30 MG tablet Commonly known as:  REMERON TAKE ONE (1) TABLET AT BEDTIME   pseudoephedrine-guaifenesin 60-600 MG 12 hr tablet Commonly known as:  MUCINEX D Take 1 tablet by mouth 2 (two) times daily as needed for congestion.   simvastatin 10 MG tablet Commonly known as:  ZOCOR TAKE ONE (1) TABLET AT BEDTIME   traMADol 50 MG tablet Commonly known as:  ULTRAM TAKE 1/2 (ONE-HALF) TABLET BY MOUTH EVERY 6 HOURS AS NEEDED   ULTRAVATE 0.05 % ointment Generic drug:  halobetasol Apply topically 2 (two) times daily. As needed

## 2018-07-26 NOTE — Telephone Encounter (Signed)
ATC Nancy back but she did not answer and her VM is not setup. Will attempt to call back later.

## 2018-08-10 ENCOUNTER — Other Ambulatory Visit: Payer: Self-pay | Admitting: Pulmonary Disease

## 2018-08-12 ENCOUNTER — Other Ambulatory Visit: Payer: Self-pay | Admitting: Adult Health

## 2018-08-13 NOTE — Telephone Encounter (Signed)
Sent to the pharmacy by e-scribe. 

## 2018-09-02 ENCOUNTER — Other Ambulatory Visit: Payer: Self-pay | Admitting: Pulmonary Disease

## 2018-09-09 ENCOUNTER — Other Ambulatory Visit: Payer: Self-pay | Admitting: Pulmonary Disease

## 2018-09-13 ENCOUNTER — Telehealth: Payer: Self-pay | Admitting: Adult Health

## 2018-09-13 MED ORDER — LEVOTHYROXINE SODIUM 75 MCG PO TABS: ORAL_TABLET | ORAL | 0 refills | Status: DC

## 2018-09-13 NOTE — Telephone Encounter (Signed)
Sent to the pharmacy by e-scribe. 

## 2018-09-13 NOTE — Telephone Encounter (Signed)
Copied from [place] [id number]. Topic: Quick Communication - Rx Refill/Question >> Sep 13, 2018  8:34 AM Margot Ables wrote: Medication: Levothyroxine Sodium 75 MCG - 2 pills left - takes 1/day - sister-in-law [name] states pharmacy has requested a couple times since 09/09/2018 - advised her they sent to Dr. Lenna Gilford who retired - [name] states they told her it was sent to [facility] 2x  Has the patient contacted their pharmacy? Yes Preferred Pharmacy (with phone number or street name): [facility], [place] - [place] [phone] (Phone) [phone] (Fax)

## 2018-09-18 ENCOUNTER — Telehealth: Payer: Self-pay | Admitting: Family Medicine

## 2018-09-18 NOTE — Telephone Encounter (Signed)
Received a fax from the pharmacy.  Pt has been on Amneal mfg of levothyroxine.  Pharmacy is no longer stocking that.  Now has Alvogen mfg.  Ok to switch?  Will pt need lab work?

## 2018-09-19 NOTE — Telephone Encounter (Signed)
Ok to switch. Will need TSH in 3-4 weeks

## 2018-09-20 NOTE — Telephone Encounter (Signed)
Pharmacy notified to proceed with new manufacturer.  Spoke to the pt.  She is coming due for cpx in 11/2018.  Can she come at that time and have lab work?  Pt is ride dependent.

## 2018-09-24 NOTE — Telephone Encounter (Signed)
Pt notified that it is ok to wait until May to do lab work.  Nothing further needed.

## 2018-09-24 NOTE — Telephone Encounter (Signed)
That is fine 

## 2018-11-16 ENCOUNTER — Other Ambulatory Visit: Payer: Self-pay | Admitting: Adult Health

## 2018-11-16 ENCOUNTER — Other Ambulatory Visit: Payer: Self-pay | Admitting: Pulmonary Disease

## 2018-11-18 NOTE — Telephone Encounter (Signed)
SENT TO THE PHARMACY BY E-SCRIBE. 

## 2018-11-20 ENCOUNTER — Other Ambulatory Visit: Payer: Self-pay | Admitting: Family Medicine

## 2018-11-20 MED ORDER — LORAZEPAM 1 MG PO TABS: ORAL_TABLET | ORAL | 1 refills | Status: DC

## 2018-12-02 ENCOUNTER — Other Ambulatory Visit: Payer: Self-pay | Admitting: Pulmonary Disease

## 2018-12-02 ENCOUNTER — Other Ambulatory Visit: Payer: Self-pay | Admitting: Adult Health

## 2018-12-02 DIAGNOSIS — F329 Major depressive disorder, single episode, unspecified: Secondary | ICD-10-CM

## 2018-12-02 DIAGNOSIS — G4700 Insomnia, unspecified: Secondary | ICD-10-CM

## 2018-12-02 DIAGNOSIS — F32A Depression, unspecified: Secondary | ICD-10-CM

## 2018-12-02 NOTE — Telephone Encounter (Signed)
Denied.  Sent to the pharmacy by e-scribe for 6 months in 06/2018.

## 2018-12-10 ENCOUNTER — Other Ambulatory Visit: Payer: Self-pay | Admitting: Adult Health

## 2018-12-11 NOTE — Telephone Encounter (Signed)
Ok to refill for 90 days  

## 2018-12-11 NOTE — Telephone Encounter (Signed)
Sent to the pharmacy by e-scribe. 

## 2019-02-03 ENCOUNTER — Other Ambulatory Visit: Payer: Self-pay | Admitting: Adult Health

## 2019-02-06 ENCOUNTER — Telehealth: Payer: Self-pay | Admitting: *Deleted

## 2019-02-06 ENCOUNTER — Ambulatory Visit: Payer: Medicare Other | Admitting: Adult Health

## 2019-02-06 NOTE — Telephone Encounter (Signed)
Heidi [name] calling in again to schedule appt. Please advise.

## 2019-02-06 NOTE — Telephone Encounter (Signed)
Copied from [place] [id number]. Topic: General - Other >> Feb 05, 2019 12:49 PM Leward Quan A wrote: Reason for CRM: Patient sister in law Doylene Canning called to reschedule her appointment for another day in the early morning. Please call Doylene Canning at Ph# [phone] >> Feb 06, 2019  8:44 AM Keene Breath wrote: Fransico Setters is calling back to reschedule appt.  Please call back at [phone] >> Feb 05, 2019  4:51 PM Norm Salt wrote: Called and left vm for patient. I did tell them that I will be canceling appt for tomorrow and they will have to call back to r/s appt

## 2019-02-07 NOTE — Telephone Encounter (Signed)
Spoke to the pt.  Family members thought they needed to use their cell phones for virtual visit.  Explained that we're going a telephone visit.  She now understands and rescheduled.  Nothing further needed.  Will close note.

## 2019-02-10 ENCOUNTER — Other Ambulatory Visit: Payer: Self-pay | Admitting: Adult Health

## 2019-02-11 NOTE — Telephone Encounter (Signed)
Sent to the pharmacy by e-scribe. 

## 2019-02-11 NOTE — Telephone Encounter (Signed)
Ok to refill for 6 months 

## 2019-02-12 ENCOUNTER — Other Ambulatory Visit: Payer: Self-pay

## 2019-02-12 ENCOUNTER — Encounter: Payer: Self-pay | Admitting: Adult Health

## 2019-02-12 ENCOUNTER — Ambulatory Visit (INDEPENDENT_AMBULATORY_CARE_PROVIDER_SITE_OTHER): Payer: Medicare Other | Admitting: Adult Health

## 2019-02-12 DIAGNOSIS — F329 Major depressive disorder, single episode, unspecified: Secondary | ICD-10-CM

## 2019-02-12 DIAGNOSIS — E559 Vitamin D deficiency, unspecified: Secondary | ICD-10-CM | POA: Diagnosis not present

## 2019-02-12 DIAGNOSIS — E785 Hyperlipidemia, unspecified: Secondary | ICD-10-CM

## 2019-02-12 DIAGNOSIS — E039 Hypothyroidism, unspecified: Secondary | ICD-10-CM

## 2019-02-12 DIAGNOSIS — F411 Generalized anxiety disorder: Secondary | ICD-10-CM | POA: Diagnosis not present

## 2019-02-12 DIAGNOSIS — F32A Depression, unspecified: Secondary | ICD-10-CM

## 2019-02-12 DIAGNOSIS — J449 Chronic obstructive pulmonary disease, unspecified: Secondary | ICD-10-CM

## 2019-02-12 MED ORDER — LEVOTHYROXINE SODIUM 75 MCG PO TABS: ORAL_TABLET | ORAL | 1 refills | Status: DC

## 2019-02-12 MED ORDER — SIMVASTATIN 10 MG PO TABS: ORAL_TABLET | ORAL | 1 refills | Status: DC

## 2019-02-12 NOTE — Progress Notes (Signed)
Virtual Visit via Telephone Note  I connected with Malena Peer on 02/12/19 at 10:00 AM EDT by telephone and verified that I am speaking with the correct person using two identifiers.   I discussed the limitations, risks, security and privacy concerns of performing an evaluation and management service by telephone and the availability of in person appointments. I also discussed with the patient that there may be a patient responsible charge related to this service. The patient expressed understanding and agreed to proceed.  Location patient: home Location provider: work or home office Participants present for the call: patient, provider, caregiver [name]) Patient did not have a visit in the prior 7 days to address this/these issue(s).   History of Present Illness: 78 year old female who  has a past medical history of Arthritis, Cerebrovascular disease, Chronic diastolic CHF (congestive heart failure) ([place]) (02/23/2016), COPD (chronic obstructive pulmonary disease) ([place]), Depression, Diverticulosis, Esophageal stricture, Generalized anxiety disorder, History of pneumonia, History of stroke, adenomatous colonic polyps, Hyperlipidemia, Hypothyroidism, Internal hemorrhoids, Osteoporosis, Pneumonia, Positive ANA (antinuclear antibody), Pulmonary nodule, Rectal ulcer, Shortness of breath, and 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([place]).  She was last seen in the office in May 2019.  She is being evaluated today for routine follow-up.  In the past she has been seen by pulmonary for severe COPD/emphysema.  Her pulmonologist retired and she was not set up with a new pulmonologist.  She is on chronic oxygen therapy at 2 to 3 L via nasal cannula.,  Ellipta Trelegy, dial max 250 mg daily and DuoNeb nebulizers 3 times a day as needed.  She reports that she is doing very well and has not had any breathing issues past baseline since last winter.  She is wondering if I can fill her pulmonary medications for the time being.  She does  take Zocor 10 mg for hyperlipidemia, and her cholesterol panel has been controlled in the past. Lab Results  Component Value Date   CHOL 154 04/13/2017   HDL 64.80 04/13/2017   LDLCALC 63 04/13/2017   LDLDIRECT 340.7 10/14/2012   TRIG 129.0 04/13/2017   CHOLHDL 2 04/13/2017   Due to history of hypothyroidism she takes Synthroid 75 mcg every morning.  Her TSH was last checked in February 2019 and was within normal limits.  She is taking Remeron 30 mg for depression she feels as though this is well controlled.  She is sleeping well and her appetite is good.  She becomes anxious or upset she will take one half tab of Ativan as needed.  Her last refill of this medication was in May 2020  For chronic back pain she takes tramadol as needed her last refill this medication was in September 2018.   Observations/Objective: Patient sounds cheerful and well on the phone. I do not appreciate any SOB. Speech and thought processing are grossly intact. Patient reported vitals:  Assessment and Plan: 1. Generalized anxiety disorder - Continue with Ativan PRN   2. COPD mixed type [facility]) -She is well controlled on her medications.  I advised her and her caregiver [name] that I would be happy to take over medication renewals that pulmonary was doing.  If we need pulmonary for anything we can always send her back.  I will have her come in for labs at her convenience.  Due to COVID-19 pandemic she is high risk candidate  3. Depression, unspecified depression type - Continue with remeron   4. Hyperlipidemia, unspecified hyperlipidemia type - Consider increase in statin  -  Lipid panel; Future - CBC with Differential/Platelet; Future - Basic Metabolic Panel; Future  5. Hypothyroidism, unspecified type - Consider increase in synthroid - TSH; Future - Lipid panel; Future - CBC with Differential/Platelet; Future - Basic Metabolic Panel; Future  6. Vitamin D deficiency  - Vitamin D, 25-hydroxy;  Future  Dorothyann Peng, NP   Follow Up Instructions:  I did not refer this patient for an OV in the next 24 hours for this/these issue(s).  I discussed the assessment and treatment plan with the patient. The patient was provided an opportunity to ask questions and all were answered. The patient agreed with the plan and demonstrated an understanding of the instructions.   The patient was advised to call back or seek an in-person evaluation if the symptoms worsen or if the condition fails to improve as anticipated.  I provided 30 minutes of non-face-to-face time during this encounter.   Dorothyann Peng, NP

## 2019-02-13 ENCOUNTER — Other Ambulatory Visit: Payer: Self-pay

## 2019-02-15 ENCOUNTER — Telehealth: Payer: Self-pay | Admitting: Adult Health

## 2019-02-18 NOTE — Telephone Encounter (Signed)
DENIED. FILLED ON 02/12/2019.  MESSAGE SENT TO THE PHARMACY.

## 2019-03-05 ENCOUNTER — Other Ambulatory Visit: Payer: Self-pay | Admitting: Adult Health

## 2019-03-05 MED ORDER — METHYLPREDNISOLONE 8 MG PO TABS: ORAL_TABLET | ORAL | 6 refills | Status: DC

## 2019-03-05 NOTE — Telephone Encounter (Signed)
Sent to [facility]

## 2019-03-05 NOTE — Telephone Encounter (Signed)
Patient caretaker and sister in called to say that the pharmacy is waiting on an Rx so they can refill this medication methylPREDNISolone (MEDROL) 8 MG tablet Please send new Rx to pharmacy ASAP thank you

## 2019-04-22 ENCOUNTER — Other Ambulatory Visit: Payer: Self-pay

## 2019-04-22 ENCOUNTER — Encounter: Payer: Self-pay | Admitting: Adult Health

## 2019-04-22 ENCOUNTER — Ambulatory Visit (INDEPENDENT_AMBULATORY_CARE_PROVIDER_SITE_OTHER): Payer: Medicare Other | Admitting: Adult Health

## 2019-04-22 VITALS — BP 130/72 | HR 75 | Temp 97.7°F | Wt 92.0 lb

## 2019-04-22 DIAGNOSIS — G4700 Insomnia, unspecified: Secondary | ICD-10-CM

## 2019-04-22 DIAGNOSIS — Z23 Encounter for immunization: Secondary | ICD-10-CM | POA: Diagnosis not present

## 2019-04-22 DIAGNOSIS — E785 Hyperlipidemia, unspecified: Secondary | ICD-10-CM | POA: Diagnosis not present

## 2019-04-22 DIAGNOSIS — E039 Hypothyroidism, unspecified: Secondary | ICD-10-CM | POA: Diagnosis not present

## 2019-04-22 DIAGNOSIS — M810 Age-related osteoporosis without current pathological fracture: Secondary | ICD-10-CM

## 2019-04-22 DIAGNOSIS — J441 Chronic obstructive pulmonary disease with (acute) exacerbation: Secondary | ICD-10-CM | POA: Diagnosis not present

## 2019-04-22 DIAGNOSIS — E43 Unspecified severe protein-calorie malnutrition: Secondary | ICD-10-CM

## 2019-04-22 LAB — COMPREHENSIVE METABOLIC PANEL
ALT: 13 U/L (ref 0–35)
AST: 19 U/L (ref 0–37)
Albumin: 4.6 g/dL (ref 3.5–5.2)
Alkaline Phosphatase: 45 U/L (ref 39–117)
BUN: 11 mg/dL (ref 6–23)
CO2: 40 mEq/L — ABNORMAL HIGH (ref 19–32)
Calcium: 9.8 mg/dL (ref 8.4–10.5)
Chloride: 92 mEq/L — ABNORMAL LOW (ref 96–112)
Creatinine, Ser: 0.51 mg/dL (ref 0.40–1.20)
GFR: 116.74 mL/min (ref >60.00)
Glucose, Bld: 90 mg/dL (ref 70–99)
Potassium: 4.7 mEq/L (ref 3.5–5.1)
Sodium: 139 mEq/L (ref 135–145)
Total Bilirubin: 0.4 mg/dL (ref 0.2–1.2)
Total Protein: 6.9 g/dL (ref 6.0–8.3)

## 2019-04-22 LAB — LIPID PANEL
Cholesterol: 205 mg/dL — ABNORMAL HIGH (ref 0–200)
HDL: 82.9 mg/dL (ref >39.00)
LDL Cholesterol: 105 mg/dL — ABNORMAL HIGH (ref 0–99)
NonHDL: 122.32
Total CHOL/HDL Ratio: 2
Triglycerides: 86 mg/dL (ref 0.0–149.0)
VLDL: 17.2 mg/dL (ref 0.0–40.0)

## 2019-04-22 LAB — CBC WITH DIFFERENTIAL/PLATELET
Basophils Absolute: 0 10*3/uL (ref 0.0–0.1)
Basophils Relative: 0.6 % (ref 0.0–3.0)
Eosinophils Absolute: 0 10*3/uL (ref 0.0–0.7)
Eosinophils Relative: 0.8 % (ref 0.0–5.0)
HCT: 42.4 % (ref 36.0–46.0)
Hemoglobin: 13.6 g/dL (ref 12.0–15.0)
Lymphocytes Relative: 15.4 % (ref 12.0–46.0)
Lymphs Abs: 0.9 10*3/uL (ref 0.7–4.0)
MCHC: 32.1 g/dL (ref 30.0–36.0)
MCV: 94.8 fl (ref 78.0–100.0)
Monocytes Absolute: 0.2 10*3/uL (ref 0.1–1.0)
Monocytes Relative: 4.1 % (ref 3.0–12.0)
Neutro Abs: 4.7 10*3/uL (ref 1.4–7.7)
Neutrophils Relative %: 79.1 % — ABNORMAL HIGH (ref 43.0–77.0)
Platelets: 232 10*3/uL (ref 150.0–400.0)
RBC: 4.47 Mil/uL (ref 3.87–5.11)
RDW: 14.2 % (ref 11.5–15.5)
WBC: 5.9 10*3/uL (ref 4.0–10.5)

## 2019-04-22 LAB — TSH: TSH: 2.32 u[IU]/mL (ref 0.35–4.50)

## 2019-04-22 MED ORDER — ACETAZOLAMIDE 250 MG PO TABS: 250.0000 mg | ORAL_TABLET | Freq: Every day | ORAL | 0 refills | Status: DC

## 2019-04-22 MED ORDER — TRELEGY ELLIPTA 100-62.5-25 MCG/INH IN AEPB: 1.0000 | INHALATION_SPRAY | Freq: Every day | RESPIRATORY_TRACT | 3 refills | Status: DC

## 2019-04-22 MED ORDER — LEVOTHYROXINE SODIUM 75 MCG PO TABS: ORAL_TABLET | ORAL | 3 refills | Status: DC

## 2019-04-22 MED ORDER — IPRATROPIUM-ALBUTEROL 0.5-2.5 (3) MG/3ML IN SOLN: 3.0000 mL | Freq: Three times a day (TID) | RESPIRATORY_TRACT | 3 refills | Status: AC

## 2019-04-22 MED ORDER — MIRTAZAPINE 45 MG PO TABS: 45.0000 mg | ORAL_TABLET | Freq: Every day | ORAL | 1 refills | Status: DC

## 2019-04-22 MED ORDER — ALENDRONATE SODIUM 70 MG PO TABS: ORAL_TABLET | ORAL | 1 refills | Status: DC

## 2019-04-22 NOTE — Progress Notes (Signed)
Subjective:    Patient ID: Heidi [name], female    DOB: 1940/07/21, 78 y.o.   MRN: [id number]  HPI Patient presents for yearly preventative medicine examination. She is a pleasant 78 year old female who  has a past medical history of Arthritis, Cerebrovascular disease, Chronic diastolic CHF (congestive heart failure) ([place]) (02/23/2016), COPD (chronic obstructive pulmonary disease) ([place]), Depression, Diverticulosis, Esophageal stricture, Generalized anxiety disorder, History of pneumonia, History of stroke, adenomatous colonic polyps, Hyperlipidemia, Hypothyroidism, Internal hemorrhoids, Osteoporosis, Pneumonia, Positive ANA (antinuclear antibody), Pulmonary nodule, Rectal ulcer, Shortness of breath, and 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([place]).  Continues to live by herself, and her daughter-in-law, [name] keeps a close eye on her and visits her almost every day.  She also has a small dog that she cares for and enjoys this company  COPD -she is a former smoker who quit in 2016.  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  He was seen by pulmonary, Dr. Lenna Gilford but since he has retired she has not followed up with anybody in the office.  She is currently maintained on continuous oxygen at 2 L, Trelegy Ellipta, Diamox, rescue inhaler as needed, and DuoNeb nebulizer as needed.  Today she reports that her breathing is at baseline she has not experienced any increased shortness of breath or had to increase her continuous oxygen in quite some time.  She does need refills of her pulmonary medications until she can get in to be seen by a new pulmonologist  Hypothyroidism - Takes Synthroid 75 mcg daily.  In the past she has been noncompliant with this medication, taking it just a few times a week if that.  Since her daughter-in-law has been managing her medications Annelies has been taking this on a daily basis  Insomnia-takes Remeron 30 mg at bedtime.  She has been on this medication for a few  years.  She feels as though the effects are wearing off and she will only sleep for a few hours a night and she often finds it difficult to fall asleep.  We also used Remeron with her to help increase appetite.  She does report that her appetite is good and she eats multiple meals per day  Hyperlipidemia -takes Zocor 10 mg at bedtime  Osteoporosis - takes Fosamax. No recent falls or fractures.   All immunizations and health maintenance protocols were reviewed with the patient and needed orders were placed. She is due for flu vaccination   Appropriate screening laboratory values were ordered for the patient including screening of hyperlipidemia, renal function and hepatic function.  Medication reconciliation,  past medical history, social history, problem list and allergies were reviewed in detail with the patient  Goals were established with regard to weight loss, exercise, and  diet in compliance with medications  End of life planning was discussed. She has an advanced directive and living will.    Review of Systems  Constitutional: Negative.   HENT: Negative.   Eyes: Negative.   Respiratory: Positive for shortness of breath.   Cardiovascular: Negative.   Gastrointestinal: Negative.   Endocrine: Negative.   Genitourinary: Negative.   Musculoskeletal: Negative.   Skin: Negative.   Allergic/Immunologic: Negative.   Neurological: Negative.   Hematological: Negative.   Psychiatric/Behavioral: Positive for sleep disturbance. The patient is nervous/anxious.   All other systems reviewed and are negative.  Past Medical History:  Diagnosis Date  . Arthritis   . Cerebrovascular disease   . Chronic diastolic CHF (congestive heart failure) ([place])  02/23/2016  . COPD (chronic obstructive pulmonary disease) ([place])   . Depression   . Diverticulosis   . Esophageal stricture   . Generalized anxiety disorder   . History of pneumonia   . History of stroke    mild oncoming  . Hx of adenomatous  colonic polyps    with high grade dysplasia  . Hyperlipidemia   . Hypothyroidism   . Internal hemorrhoids   . Osteoporosis   . Pneumonia    HX of  . Positive ANA (antinuclear antibody)   . Pulmonary nodule   . Rectal ulcer   . Shortness of breath    periodically with COPD flair up  . 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([facility])     Social History   Socioeconomic History  . Marital status: Widowed    Spouse name: Not on file  . Number of children: 3  . Years of education: Not on file  . Highest education level: Not on file  Occupational History  . Occupation: RETIRED    Employer: RETIRED  Social Needs  . Financial resource strain: Not on file  . Food insecurity    Worry: Not on file    Inability: Not on file  . Transportation needs    Medical: Not on file    Non-medical: Not on file  Tobacco Use  . Smoking status: Former Smoker    Packs/day: 2.50    Years: 50.00    Pack years: 125.00    Quit date: 11/01/2009    Years since quitting: 9.4  . Smokeless tobacco: Never Used  Substance and Sexual Activity  . Alcohol use: No  . Drug use: No  . Sexual activity: Not Currently  Lifestyle  . Physical activity    Days per week: Not on file    Minutes per session: Not on file  . Stress: Not on file  Relationships  . Social Herbalist on phone: Not on file    Gets together: Not on file    Attends religious service: Not on file    Active member of club or organization: Not on file    Attends meetings of clubs or organizations: Not on file    Relationship status: Not on file  . Intimate partner violence    Fear of current or ex partner: Not on file    Emotionally abused: Not on file    Physically abused: Not on file    Forced sexual activity: Not on file  Other Topics Concern  . Not on file  Social History Narrative   Patient has no recent travel. She is smoking 2 packs per day and has smoked since age of 74. Has cats and dogs at home. No bird or mold exposure.       dtr with  temporary     Past Surgical History:  Procedure Laterality Date  . BLADDER SURGERY     tack  . CATARACT EXTRACTION     bilateral  . COLONOSCOPY  06/22/2011   Procedure: COLONOSCOPY;  Surgeon: Lafayette Dragon, MD;  Location: [facility];  Service: Endoscopy;  Laterality: N/A;  . GASTRIC VARICES BANDING  06/22/2011   Procedure: HEMORRHOID BANDING;  Surgeon: Lafayette Dragon, MD;  Location: [facility];  Service: Endoscopy;  Laterality: N/A;  . HEMICOLECTOMY  2012   emergent surgery [facility]  . HIATAL HERNIA REPAIR     x 2  . TONSILLECTOMY    . TOTAL ABDOMINAL HYSTERECTOMY W/ BILATERAL SALPINGOOPHORECTOMY  1998  Family History  Problem Relation Age of Onset  . Colon cancer Mother   . COPD Father   . Heart attack Father   . Stroke Maternal Aunt   . Colon polyps Brother   . Liver disease Maternal Aunt   . Breast cancer Maternal Aunt   . Hypertension Son   . Hypothyroidism Daughter     Allergies  Allergen Reactions  . Morphine And Related Nausea And Vomiting  . Penicillins Hives    Has patient had a PCN reaction causing immediate rash, facial/tongue/throat swelling, SOB or lightheadedness with hypotension: No Has patient had a PCN reaction causing severe rash involving mucus membranes or skin necrosis: No Has patient had a PCN reaction that required hospitalization No Has patient had a PCN reaction occurring within the last 10 years: No If all of the above answers are "NO", then may proceed with Cephalosporin use.    Current Outpatient Medications on File Prior to Visit  Medication Sig Dispense Refill  . albuterol (PROVENTIL HFA;VENTOLIN HFA) 108 (90 Base) MCG/ACT inhaler Inhale 2 puffs into the lungs every 6 (six) hours as needed for wheezing or shortness of breath. 1 Inhaler 0  . furosemide (LASIX) 20 MG tablet Take 1 tablet (20 mg total) by mouth daily as needed for fluid or edema. 30 tablet 0  . gabapentin (NEURONTIN) 300 MG capsule TAKE ONE (1) CAPSULE BY MOUTH 2  TIMES DAILY 180 capsule 1  . levothyroxine (SYNTHROID) 75 MCG tablet TAKE ONE (1) TABLET BY MOUTH ONCE DAILY BEFORE BREAKFAST 90 tablet 1  . LORazepam (ATIVAN) 1 MG tablet TAKE 1/2 TO 1 TABLET BY MOUTH 3 TIMES DAILY AS NEEDED 180 tablet 1  . Melatonin 5 MG CAPS Take by mouth.    . naproxen sodium (ALEVE) 220 MG tablet Take 220 mg by mouth daily as needed.    . pseudoephedrine-guaifenesin (MUCINEX D) 60-600 MG 12 hr tablet Take 1 tablet by mouth 2 (two) times daily as needed for congestion.    . simvastatin (ZOCOR) 10 MG tablet TAKE ONE (1) TABLET AT BEDTIME 90 tablet 1  . traMADol (ULTRAM) 50 MG tablet TAKE 1/2 (ONE-HALF) TABLET BY MOUTH EVERY 6 HOURS AS NEEDED 15 tablet 0   No current facility-administered medications on file prior to visit.     BP 130/72   Pulse 75   Temp 97.7 F (36.5 C) (Temporal)   Wt 92 lb (41.7 kg)   SpO2 100%   BMI 16.30 kg/m       Objective:   Physical Exam Vitals signs and nursing note reviewed.  Constitutional:      Appearance: She is underweight. She is ill-appearing (chronically).  HENT:     Head: Normocephalic and atraumatic.     Right Ear: Tympanic membrane, ear canal and external ear normal. There is no impacted cerumen.     Left Ear: Tympanic membrane, ear canal and external ear normal. There is no impacted cerumen.     Nose: Nose normal. No congestion or rhinorrhea.     Mouth/Throat:     Mouth: Mucous membranes are moist.     Pharynx: Oropharynx is clear.  Eyes:     Extraocular Movements: Extraocular movements intact.     Pupils: Pupils are equal, round, and reactive to light.  Cardiovascular:     Rate and Rhythm: Normal rate and regular rhythm.     Pulses: Normal pulses.     Heart sounds: Normal heart sounds. No murmur. No friction rub. No gallop.  Pulmonary:     Effort: Pulmonary effort is normal.     Breath sounds: No wheezing, rhonchi or rales. Decreased breath sounds: throughout      Comments: O2 via nasal cannula Abdominal:      General: Abdomen is flat. Bowel sounds are normal.     Palpations: Abdomen is soft. There is no mass.  Musculoskeletal: Normal range of motion.        General: No swelling, tenderness, deformity or signs of injury.     Right lower leg: No edema.     Left lower leg: No edema.  Skin:    General: Skin is warm and dry.     Capillary Refill: Capillary refill takes less than 2 seconds.     Coloration: Skin is not jaundiced or pale.     Findings: No bruising, erythema, lesion or rash.  Neurological:     General: No focal deficit present.     Mental Status: She is alert and oriented to person, place, and time.     Comments: In wheelchair for exam  Psychiatric:        Mood and Affect: Mood normal.        Behavior: Behavior normal. Behavior is cooperative.        Thought Content: Thought content normal.        Judgment: Judgment normal.       Assessment & Plan:  1. COPD exacerbation ([place]) - advised to call Pulmonary and schedule with new provider  - CBC with Differential/Platelet - Comprehensive metabolic panel - Lipid panel - TSH - Fluticasone-Umeclidin-Vilant (TRELEGY ELLIPTA) 100-62.5-25 MCG/INH AEPB; Inhale 1 puff into the lungs daily.  Dispense: 28 each; Refill: 3 - acetaZOLAMIDE (DIAMOX) 250 MG tablet; Take 1 tablet (250 mg total) by mouth daily.  Dispense: 90 tablet; Refill: 0 - ipratropium-albuterol (DUONEB) 0.5-2.5 (3) MG/3ML SOLN; Take 3 mLs by nebulization 3 (three) times daily.  Dispense: 120 mL; Refill: 3  2. Insomnia, unspecified type - Will increase Remeron to 45 mg  - Advised follow up if no improvement  - mirtazapine (REMERON) 45 MG tablet; Take 1 tablet (45 mg total) by mouth at bedtime.  Dispense: 90 tablet; Refill: 1  3. Hyperlipidemia, unspecified hyperlipidemia type - Continue with statin  - CBC with Differential/Platelet - Comprehensive metabolic panel - Lipid panel - TSH  4. Hypothyroidism, unspecified type - CBC with Differential/Platelet - Comprehensive  metabolic panel - Lipid panel - TSH - ipratropium-albuterol (DUONEB) 0.5-2.5 (3) MG/3ML SOLN; Take 3 mLs by nebulization 3 (three) times daily.  Dispense: 120 mL; Refill: 3 - levothyroxine (SYNTHROID) 75 MCG tablet; TAKE ONE (1) TABLET BY MOUTH ONCE DAILY BEFORE BREAKFAST  Dispense: 90 tablet; Refill: 3  5. Need for prophylactic vaccination and inoculation against influenza  - Flu Vaccine QUAD High Dose(Fluad)  6. Protein-calorie malnutrition, severe - Encouraged high protein diet  - CBC with Differential/Platelet - Comprehensive metabolic panel - Lipid panel - TSH  7. Age-related osteoporosis without current pathological fracture  - alendronate (FOSAMAX) 70 MG tablet; TAKE 1 TABLET BY MOUTH ONCE A WEEK IN THE MORNING WITH A GLASS OF WATER. DO NOT EAT OR LIE DOWN FOR 30 MINUTES.  Dispense: 12 tablet; Refill: 1  Dorothyann Peng, NP

## 2019-05-12 ENCOUNTER — Telehealth: Payer: Self-pay | Admitting: Adult Health

## 2019-05-12 NOTE — Telephone Encounter (Signed)
albuterol (PROVENTIL HFA;VENTOLIN HFA) 108 (90 Base) MCG/ACT inhaler  Pt had med appt on 10/6 with Cory/ states that in that visit it was discussed that this Dr Nicki Reaper has retired and Tommi Rumps would take this over Please refill   simvastatin (ZOCOR) 10 MG tablet  This was supposed rto be refilled /Note last time [place] said he would increase to 20 mg, he did not so she said she kept taking the 10mg  vs the 20mg  Pls refill [place], [place] - [place] [phone] (Phone) [phone] (Fax)

## 2019-05-13 MED ORDER — SIMVASTATIN 20 MG PO TABS: ORAL_TABLET | ORAL | 3 refills | Status: DC

## 2019-05-13 MED ORDER — ALBUTEROL SULFATE HFA 108 (90 BASE) MCG/ACT IN AERS: 2.0000 | INHALATION_SPRAY | Freq: Four times a day (QID) | RESPIRATORY_TRACT | 2 refills | Status: DC | PRN

## 2019-05-16 ENCOUNTER — Telehealth: Payer: Self-pay | Admitting: Adult Health

## 2019-05-16 DIAGNOSIS — J441 Chronic obstructive pulmonary disease with (acute) exacerbation: Secondary | ICD-10-CM

## 2019-05-16 MED ORDER — TRELEGY ELLIPTA 100-62.5-25 MCG/INH IN AEPB: 1.0000 | INHALATION_SPRAY | Freq: Every day | RESPIRATORY_TRACT | 3 refills | Status: DC

## 2019-05-16 MED ORDER — SIMVASTATIN 20 MG PO TABS: ORAL_TABLET | ORAL | 3 refills | Status: DC

## 2019-05-16 NOTE — Telephone Encounter (Signed)
Copied from [place] [phone]. Topic: General - Other >> May 16, 2019  3:00 PM Keene Breath wrote: Reason for CRM: Patient's sister-n-law called to inform the nurse and doctor that the patient's script was sent to the wrong pharmacy.  Patient is all out of the medications and needs a call back to discuss how she will be able to get her medication as soon as possible.  CB# [phone]

## 2019-05-16 NOTE — Telephone Encounter (Signed)
Medications sent to the correct pharmacy.  Nothing further needed.

## 2019-05-22 ENCOUNTER — Telehealth: Payer: Self-pay | Admitting: Family Medicine

## 2019-05-22 NOTE — Telephone Encounter (Signed)
Copied from [place] [id number]. Topic: General - Inquiry >> May 22, 2019 12:27 PM Richardo Priest, NT wrote: Reason for CRM: [facility] called in stating they are needing to have office notes and diagnosis from patient's visit on 10/6, as well as script for oxygen, as patient's insurance is trying to see if the renewal for oxygen is needed. Please advise. Call back is [phone]. Fax is [phone].

## 2019-05-29 NOTE — Telephone Encounter (Signed)
Find out of they called and scheduled with pulmonary yet. If not then we can get her set up. How much oxygen is she wearing now?

## 2019-05-29 NOTE — Telephone Encounter (Signed)
Spoke to the pt and advised that she will need to call pulmonary and schedule with a new pulmonologist.  Will hold message to see if patient calls.

## 2019-06-03 ENCOUNTER — Ambulatory Visit (INDEPENDENT_AMBULATORY_CARE_PROVIDER_SITE_OTHER): Payer: Medicare Other | Admitting: Pulmonary Disease

## 2019-06-03 ENCOUNTER — Other Ambulatory Visit: Payer: Self-pay

## 2019-06-03 ENCOUNTER — Encounter: Payer: Self-pay | Admitting: Pulmonary Disease

## 2019-06-03 VITALS — BP 128/78 | HR 98 | Temp 98.0°F | Ht 64.0 in | Wt 87.2 lb

## 2019-06-03 DIAGNOSIS — J449 Chronic obstructive pulmonary disease, unspecified: Secondary | ICD-10-CM | POA: Diagnosis not present

## 2019-06-03 DIAGNOSIS — J9611 Chronic respiratory failure with hypoxia: Secondary | ICD-10-CM | POA: Diagnosis not present

## 2019-06-03 NOTE — Patient Instructions (Addendum)
COPD  --CONTINUE Trelegy 100-62.5-25 mcg. ONE puffs ONCE a day. --CONTINUE Duonebs ONE to TWO times daily --CONTINUE medrol 4 mg daily  Chronic hypoxemic respiratory failure --Continue supplemental oxygen for SpO2 goal >88%   Follow-up

## 2019-06-03 NOTE — Progress Notes (Signed)
Subjective:   PATIENT ID: Heidi [name] GENDER: female DOB: 1940-08-19, MRN: [id number]   HPI  Chief Complaint  Patient presents with  . Follow-up    Patient is establishing care today. Patient feels healthy otherwise.   Reason for Visit: Follow-up, prior Dr. 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 Her sister-in-law/caregiver is present and provides additional history.  Reviewed records in EMR including Dr. Jeannine Kitten recent note on 11/20/17. She was last seen by Dr. Lenna Gilford on 11/20/17 as her PCP and pulmonologist. He has followed her since 2016 for multiple medical issues including her end-stage COPD. Dr. Lenna Gilford has documented a prior history of non-adherence to therapy and inability to perform spirometry due to the severity of her lung issues. She previously was in hospice but has since graduated. Since Dr. Jeannine Kitten retirement, she has transferred her primary care issues to [place] with Dorothyann Peng, NP. She reports she takes her Trelegy daily. She has been on chronic steroid therapy for many years and has been titrated down to medrol 4 mg daily. She continues to have baseline dyspnea on exertion but does not use her PRN nebulizer or rescue inhaler. Occasional wheezing and sputum production. Has been staying at home during the pandemic and minimizing her exposures.  Social History: Former smoker. Quit 2016. >99 pack years.  I have personally reviewed patient's past medical/family/social history, allergies, current medications.  Past Medical History:  Diagnosis Date  . Arthritis   . Cerebrovascular disease   . Chronic diastolic CHF (congestive heart failure) ([place]) 02/23/2016  . COPD (chronic obstructive pulmonary disease) ([place])   . Depression   . Diverticulosis   .  Esophageal stricture   . Generalized anxiety disorder   . History of pneumonia   . History of stroke    mild oncoming  . 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 Hyperlipidemia   . Hypothyroidism   . Internal hemorrhoids   . Osteoporosis   . Pneumonia    HX of  . Positive ANA (antinuclear antibody)   . Pulmonary nodule   . Rectal ulcer   . Shortness of breath    periodically with COPD flair up  . 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 Colon cancer Mother   . COPD Father   . Heart attack Father   . Stroke Maternal Aunt   . Colon polyps Brother   . Liver disease Maternal Aunt   . Breast cancer Maternal Aunt   . Hypertension Son   . Hypothyroidism Daughter      Social History   Occupational History  . Occupation: RETIRED    Employer: RETIRED  Tobacco Use  . Smoking status: Former Smoker    Packs/day: 2.50    Years: 50.00    Pack years: 125.00    Quit date: 11/01/2009    Years since quitting: 9.5  . Smokeless tobacco: Never Used  Substance and Sexual Activity  . Alcohol use: No  . Drug use: No  . Sexual activity: Not Currently    Allergies  Allergen Reactions  . Morphine And Related Nausea And Vomiting  . Penicillins Hives    Has patient had a PCN reaction causing immediate rash, facial/tongue/throat swelling, SOB or lightheadedness with  hypotension: No Has patient had a PCN reaction causing severe rash involving mucus membranes or skin necrosis: No Has patient had a PCN reaction that required hospitalization No Has patient had a PCN reaction occurring within the last 10 years: No If all of the above answers are "NO", then may proceed with Cephalosporin use.     Outpatient Medications Prior to Visit  Medication Sig Dispense Refill  . acetaZOLAMIDE (DIAMOX) 250 MG tablet Take 1 tablet (250 mg total) by mouth daily. 90 tablet 0  . albuterol (VENTOLIN HFA) 108 (90 Base) MCG/ACT inhaler Inhale 2  puffs into the lungs every 6 (six) hours as needed for wheezing or shortness of breath. 8 g 2  . alendronate (FOSAMAX) 70 MG tablet TAKE 1 TABLET BY MOUTH ONCE A WEEK IN THE MORNING WITH A GLASS OF WATER. DO NOT EAT OR LIE DOWN FOR 30 MINUTES. 12 tablet 1  . Fluticasone-Umeclidin-Vilant (TRELEGY ELLIPTA) 100-62.5-25 MCG/INH AEPB Inhale 1 puff into the lungs daily. 28 each 3  . furosemide (LASIX) 20 MG tablet Take 1 tablet (20 mg total) by mouth daily as needed for fluid or edema. 30 tablet 0  . gabapentin (NEURONTIN) 300 MG capsule TAKE ONE (1) CAPSULE BY MOUTH 2 TIMES DAILY 180 capsule 1  . ipratropium-albuterol (DUONEB) 0.5-2.5 (3) MG/3ML SOLN Take 3 mLs by nebulization 3 (three) times daily. 120 mL 3  . levothyroxine (SYNTHROID) 75 MCG tablet TAKE ONE (1) TABLET BY MOUTH ONCE DAILY BEFORE BREAKFAST 90 tablet 3  . LORazepam (ATIVAN) 1 MG tablet TAKE 1/2 TO 1 TABLET BY MOUTH 3 TIMES DAILY AS NEEDED 180 tablet 1  . Melatonin 5 MG CAPS Take by mouth.    . mirtazapine (REMERON) 45 MG tablet Take 1 tablet (45 mg total) by mouth at bedtime. 90 tablet 1  . naproxen sodium (ALEVE) 220 MG tablet Take 220 mg by mouth daily as needed.    . pseudoephedrine-guaifenesin (MUCINEX D) 60-600 MG 12 hr tablet Take 1 tablet by mouth 2 (two) times daily as needed for congestion.    . simvastatin (ZOCOR) 20 MG tablet TAKE ONE (1) TABLET AT BEDTIME 90 tablet 3   No facility-administered medications prior to visit.     Review of Systems  Constitutional: Negative for chills, diaphoresis, fever, malaise/fatigue and weight loss.  HENT: Negative for congestion, ear pain and sore throat.   Respiratory: Positive for cough, sputum production, shortness of breath and wheezing. Negative for hemoptysis.   Cardiovascular: Negative for chest pain, palpitations and leg swelling.  Gastrointestinal: Negative for abdominal pain, heartburn and nausea.  Genitourinary: Negative for frequency.  Musculoskeletal: Negative for joint  pain and myalgias.  Skin: Negative for itching and rash.  Neurological: Negative for dizziness, weakness and headaches.  Endo/Heme/Allergies: Does not bruise/bleed easily.  Psychiatric/Behavioral: Negative for depression. The patient is not nervous/anxious.      Objective:   Vitals:   06/03/19 1603 06/03/19 1606  BP:  128/78  Pulse: 98 98  Temp:  98 F (36.7 C)  TempSrc:  Temporal  SpO2: 94% 94%  Weight:  87 lb 3.2 oz (39.6 kg)  Height:  5\' 4"  (1.626 m)      Physical Exam: General: Well-appearing, no acute distress HENT: [place], AT Eyes: EOMI, no scleral icterus Respiratory: Clear to auscultation bilaterally.  No crackles, wheezing or rales Cardiovascular: RRR, -M/R/G, no JVD GI: BS+, soft, nontender Extremities:-Edema,-tenderness Neuro: AAO x4, CNII-XII grossly intact Skin: Intact, no rashes or bruising Psych: Normal mood, normal affect  Data  Reviewed:  Imaging: CT Chest 02/26/17 - Biapical scarring. Mild centrilobular emphysema. Atelectasis in lung bases  PFT: None on file  Labs: CBC    Component Value Date/Time   WBC 5.9 04/22/2019 1000   RBC 4.47 04/22/2019 1000   HGB 13.6 04/22/2019 1000   HCT 42.4 04/22/2019 1000   PLT 232.0 04/22/2019 1000   MCV 94.8 04/22/2019 1000   MCH 28.4 04/26/2015 0540   MCHC 32.1 04/22/2019 1000   RDW 14.2 04/22/2019 1000   LYMPHSABS 0.9 04/22/2019 1000   MONOABS 0.2 04/22/2019 1000   EOSABS 0.0 04/22/2019 1000   BASOSABS 0.0 04/22/2019 1000   BMET    Component Value Date/Time   NA 139 04/22/2019 1001   K 4.7 04/22/2019 1001   CL 92 (L) 04/22/2019 1001   CO2 40 (H) 04/22/2019 1001   GLUCOSE 90 04/22/2019 1001   GLUCOSE 84 06/01/2006 0958   BUN 11 04/22/2019 1001   CREATININE 0.51 04/22/2019 1001   CALCIUM 9.8 04/22/2019 1001   GFRNONAA >60 04/27/2015 0428   GFRAA >60 04/27/2015 0428    Ambulatory O2 06/03/19 O2 2lpm continuously DME: [facility]  SATURATION QUALIFICATIONS: (This note is used to comply with  regulatory documentation for home oxygen)  Patient Saturations on Room Air at Rest = 88%  Patient Saturations on Room Air while Ambulating = N/A%  Patient Saturations on 2 Liters of oxygen while Ambulating = 94  Interpretation: Recommend 2L oxygen with activity  Imaging, labs and tests noted above have been reviewed independently by me.    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 In-clinic ambulatory O2 performed. Today, we reviewed her history, medications and concerns for her COPD management.  COPD, uncontrollled --CONTINUE Trelegy 100-62.5-25 mcg. ONE puffs ONCE a day. --CONTINUE Duonebs  ONE to TWO times daily --CONTINUE medrol 4 mg daily  Chronic hypoxemic respiratory failure --Continue supplemental oxygen for SpO2 goal >88% --Recommend 2L oxygen with activity  Health Maintenance Immunization History  Administered Date(s) Administered  . Fluad Quad(high Dose 65+) 04/22/2019  . H1N1 06/30/2008  . Influenza Split 04/18/2012  . Influenza Whole 06/10/2007, 04/14/2010, 04/18/2012  . Influenza, High Dose Seasonal PF 06/02/2015, 04/10/2016, 04/11/2017, 04/25/2018  . Influenza,inj,Quad PF,6+ Mos 04/16/2013  . Pneumococcal Conjugate-13 06/02/2015  . Pneumococcal Polysaccharide-23 05/17/2006, 04/16/2013  . Td 03/29/1999  . Tdap 04/16/2013  . Zoster 07/17/2013   Orders Placed This Encounter  Procedures  . Ambulatory Referral for DME    Referral Priority:   Routine    Referral Type:   Durable Medical Equipment Purchase    Number of Visits Requested:   1  No orders of the defined types were placed in this encounter.   Return in about 3 months (around 09/03/2019), or with Dr. Loanne Drilling.  Chi Rodman Pickle, MD [facility] 06/03/2019 9:00 AM  Office Number  [phone]

## 2019-06-03 NOTE — Telephone Encounter (Signed)
Pt has an appt today to establish care with new pulmonary provider.  Nothing further needed.

## 2019-07-22 ENCOUNTER — Other Ambulatory Visit: Payer: Self-pay

## 2019-07-22 ENCOUNTER — Telehealth: Payer: Self-pay

## 2019-07-22 ENCOUNTER — Telehealth (INDEPENDENT_AMBULATORY_CARE_PROVIDER_SITE_OTHER): Payer: Medicare Other | Admitting: Adult Health

## 2019-07-22 DIAGNOSIS — G4700 Insomnia, unspecified: Secondary | ICD-10-CM

## 2019-07-22 MED ORDER — MIRTAZAPINE 45 MG PO TABS: 45.0000 mg | ORAL_TABLET | Freq: Every day | ORAL | 1 refills | Status: DC

## 2019-07-22 NOTE — Progress Notes (Signed)
Virtual Visit via Telephone Note  I connected with Heidi [name] on 07/22/19 at  2:30 PM EST by telephone and verified that I am speaking with the correct person using two identifiers.   I discussed the limitations, risks, security and privacy concerns of performing an evaluation and management service by telephone and the availability of in person appointments. I also discussed with the patient that there may be a patient responsible charge related to this service. The patient expressed understanding and agreed to proceed.  Location patient: home Location provider: work or home office Participants present for the call: patient, provider Patient did not have a visit in the prior 7 days to address this/these issue(s).   History of Present Illness: 79 year old female who is being evaluated today for insomnia.  She reports that over the last 5 weeks she has been only sleeping about 3 hours at night.  She is having trouble falling asleep as well as staying asleep.  She is currently prescribed Remeron 45 mg.  Today we reviewed her medications over the phone and she no longer has Remeron in her prescriptions.  It was last prescribed in October for 6 months.  This medication was sent to [facility] and she reports never picking it up because she does not go there for her medications any longer.   Observations/Objective: Patient sounds cheerful and well on the phone. I do not appreciate any SOB. Speech and thought processing are grossly intact. Patient reported vitals:  Assessment and Plan: 1. Insomnia, unspecified type -We reviewed her medications in detail.  She has no Remeron in her possession.  We will resend into [facility] which is a drug store that she is currently using. - mirtazapine (REMERON) 45 MG tablet; Take 1 tablet (45 mg total) by mouth at bedtime.  Dispense: 90 tablet; Refill: 1   Follow Up Instructions:  I did not refer this patient for an OV in the next 24 hours for  this/these issue(s).  I discussed the assessment and treatment plan with the patient. The patient was provided an opportunity to ask questions and all were answered. The patient agreed with the plan and demonstrated an understanding of the instructions.   The patient was advised to call back or seek an in-person evaluation if the symptoms worsen or if the condition fails to improve as anticipated.  I provided 15 minutes of non-face-to-face time during this encounter.   Dorothyann Peng, NP

## 2019-07-22 NOTE — Telephone Encounter (Signed)
Pt has been scheduled.  °

## 2019-07-22 NOTE — Telephone Encounter (Signed)
Copied from [place] [id number]. Topic: General - Other >> Jul 22, 2019 12:02 PM Leward Quan A wrote: Reason for CRM: Patient called to inform Dr Tommi Rumps that she have not been sleeping for the past 5 weeks say that the sleeping medication given is having the opposite effect. Patient is requesting a call back at Ph# [phone]

## 2019-08-04 ENCOUNTER — Other Ambulatory Visit: Payer: Self-pay | Admitting: Adult Health

## 2019-08-04 DIAGNOSIS — M810 Age-related osteoporosis without current pathological fracture: Secondary | ICD-10-CM

## 2019-08-12 ENCOUNTER — Telehealth: Payer: Self-pay | Admitting: Pulmonary Disease

## 2019-08-12 MED ORDER — ALBUTEROL SULFATE HFA 108 (90 BASE) MCG/ACT IN AERS: 2.0000 | INHALATION_SPRAY | Freq: Four times a day (QID) | RESPIRATORY_TRACT | 2 refills | Status: AC | PRN

## 2019-08-12 NOTE — Telephone Encounter (Signed)
Called and spoke to [place], pts sister-in-law. Pt is needing a refill of her albuterol rx. This has been sent to pt's preferred pharmacy. Pt verbalized understanding and denied any further questions or concerns at this time.

## 2019-09-01 ENCOUNTER — Other Ambulatory Visit: Payer: Self-pay | Admitting: Adult Health

## 2019-09-03 ENCOUNTER — Ambulatory Visit: Payer: Medicare Other | Admitting: Primary Care

## 2019-09-09 ENCOUNTER — Other Ambulatory Visit: Payer: Self-pay | Admitting: Adult Health

## 2019-09-09 DIAGNOSIS — E039 Hypothyroidism, unspecified: Secondary | ICD-10-CM

## 2019-09-15 ENCOUNTER — Other Ambulatory Visit: Payer: Self-pay | Admitting: Adult Health

## 2019-09-15 DIAGNOSIS — E039 Hypothyroidism, unspecified: Secondary | ICD-10-CM

## 2019-09-16 NOTE — Telephone Encounter (Signed)
Sent to the pharmacy by e-scribe. 

## 2019-10-02 ENCOUNTER — Telehealth: Payer: Self-pay | Admitting: Adult Health

## 2019-10-02 NOTE — Telephone Encounter (Signed)
Pt states that her sleeping pill is not working and she has taken the whole bottle. She would like to try a different alternative but would like to speak to the [place] or PCP before prescribing a different one. Pt would like to have something by Saturday.  Pt can be reached at [phone]  Pharmacy: [facility] [place]

## 2019-10-03 ENCOUNTER — Telehealth (INDEPENDENT_AMBULATORY_CARE_PROVIDER_SITE_OTHER): Payer: Medicare Other | Admitting: Adult Health

## 2019-10-03 ENCOUNTER — Other Ambulatory Visit: Payer: Self-pay

## 2019-10-03 VITALS — Wt 92.0 lb

## 2019-10-03 DIAGNOSIS — G4700 Insomnia, unspecified: Secondary | ICD-10-CM | POA: Diagnosis not present

## 2019-10-03 MED ORDER — TEMAZEPAM 15 MG PO CAPS: 15.0000 mg | ORAL_CAPSULE | Freq: Every evening | ORAL | 2 refills | Status: DC | PRN

## 2019-10-03 NOTE — Telephone Encounter (Signed)
We can do a virtual visit this afternoon

## 2019-10-03 NOTE — Progress Notes (Signed)
Virtual Visit via Telephone Note  I connected with Malena Peer on 10/03/19 at  2:30 PM EDT by telephone and verified that I am speaking with the correct person using two identifiers.   I discussed the limitations, risks, security and privacy concerns of performing an evaluation and management service by telephone and the availability of in person appointments. I also discussed with the patient that there may be a patient responsible charge related to this service. The patient expressed understanding and agreed to proceed.  Location patient: home Location provider: work or home office Participants present for the call: patient, provider Patient did not have a visit in the prior 7 days to address this/these issue(s).   History of Present Illness: 79 year old female who is being evaluated today for insomnia.  In her last visit in in your 2021 was placed back on Remeron 45 mg for chronic insomnia the prescription had not been refilled as it was sent to a pharmacy that she was not using anymore.  She has tried the medication for the last month month and a half and reports that she is still not sleeping more than 3 hours.  Her appetite has improved but her sleep continues to be an issue.  In the past she was on restoral 15 mg and did well with this medication.  She would like to go back and try this medication again.      Observations/Objective: Patient sounds cheerful and well on the phone. I do not appreciate any SOB. Speech and thought processing are grossly intact. Patient reported vitals:  Assessment and Plan: 1. Insomnia, unspecified type -We will place her back on restoral.  She knows that this medication can cause dizziness.  She understands that when she wakes in the morning she needs to get out of bed slowly.  She will follow-up with me if medication is not working well for her - temazepam (RESTORIL) 15 MG capsule; Take 1 capsule (15 mg total) by mouth at bedtime as needed for sleep.   Dispense: 30 capsule; Refill: 2   Follow Up Instructions:  I did not refer this patient for an OV in the next 24 hours for this/these issue(s).  I discussed the assessment and treatment plan with the patient. The patient was provided an opportunity to ask questions and all were answered. The patient agreed with the plan and demonstrated an understanding of the instructions.   The patient was advised to call back or seek an in-person evaluation if the symptoms worsen or if the condition fails to improve as anticipated.  I provided 13 minutes of non-face-to-face time during this encounter.   Dorothyann Peng, NP

## 2019-10-03 NOTE — Telephone Encounter (Signed)
Spoke to the pt by telephone and scheduled her for a telephone visit with [facility].  Nothing further needed.

## 2019-10-03 NOTE — Telephone Encounter (Signed)
Left a message for a return call.

## 2020-01-05 ENCOUNTER — Other Ambulatory Visit: Payer: Self-pay | Admitting: Adult Health

## 2020-01-05 DIAGNOSIS — G4700 Insomnia, unspecified: Secondary | ICD-10-CM

## 2020-01-06 ENCOUNTER — Telehealth: Payer: Self-pay | Admitting: Adult Health

## 2020-01-06 DIAGNOSIS — G4700 Insomnia, unspecified: Secondary | ICD-10-CM

## 2020-01-06 MED ORDER — TEMAZEPAM 15 MG PO CAPS: 15.0000 mg | ORAL_CAPSULE | Freq: Every evening | ORAL | 0 refills | Status: DC | PRN

## 2020-01-06 NOTE — Telephone Encounter (Signed)
Pt is requesting a refill on the Temazepam 15 mg Oral At bedtime PRN

## 2020-01-06 NOTE — Telephone Encounter (Signed)
Refilled temazepam once

## 2020-01-26 ENCOUNTER — Other Ambulatory Visit: Payer: Self-pay | Admitting: Adult Health

## 2020-01-26 DIAGNOSIS — M810 Age-related osteoporosis without current pathological fracture: Secondary | ICD-10-CM

## 2020-02-02 ENCOUNTER — Telehealth: Payer: Self-pay | Admitting: Family Medicine

## 2020-02-02 ENCOUNTER — Other Ambulatory Visit: Payer: Self-pay | Admitting: Adult Health

## 2020-02-02 DIAGNOSIS — G4700 Insomnia, unspecified: Secondary | ICD-10-CM

## 2020-02-03 NOTE — Telephone Encounter (Signed)
Routed to pcp cma

## 2020-02-05 ENCOUNTER — Other Ambulatory Visit: Payer: Self-pay | Admitting: Adult Health

## 2020-02-05 DIAGNOSIS — G4700 Insomnia, unspecified: Secondary | ICD-10-CM

## 2020-02-05 MED ORDER — TEMAZEPAM 15 MG PO CAPS: ORAL_CAPSULE | ORAL | 2 refills | Status: DC

## 2020-02-05 NOTE — Telephone Encounter (Signed)
Restoril sent in. Were they able to get Gabapetin ?

## 2020-02-05 NOTE — Telephone Encounter (Signed)
[facility] stated their policy has changed beginning July 1st that they can no longer fill any controlled prescriptions from any office that is more than 30 miles away from them. They spoke to the pt's daughter and she is asking this this medication can be filled at [facility] on [address], [place] Phone:  [phone]  Fax:  [phone]

## 2020-02-06 NOTE — Telephone Encounter (Signed)
Pt will be picking up medication tomorrow at [facility].

## 2020-02-06 NOTE — Telephone Encounter (Signed)
Left a message for a return call.

## 2020-03-15 ENCOUNTER — Other Ambulatory Visit: Payer: Self-pay | Admitting: Adult Health

## 2020-03-15 DIAGNOSIS — E039 Hypothyroidism, unspecified: Secondary | ICD-10-CM

## 2020-03-17 ENCOUNTER — Telehealth: Payer: Self-pay | Admitting: Adult Health

## 2020-03-17 NOTE — Telephone Encounter (Signed)
error 

## 2020-03-18 ENCOUNTER — Encounter: Payer: Self-pay | Admitting: Adult Health

## 2020-03-18 ENCOUNTER — Other Ambulatory Visit: Payer: Self-pay

## 2020-03-18 ENCOUNTER — Telehealth (INDEPENDENT_AMBULATORY_CARE_PROVIDER_SITE_OTHER): Payer: Medicare Other | Admitting: Adult Health

## 2020-03-18 VITALS — Ht 64.0 in | Wt 92.0 lb

## 2020-03-18 DIAGNOSIS — J441 Chronic obstructive pulmonary disease with (acute) exacerbation: Secondary | ICD-10-CM

## 2020-03-18 DIAGNOSIS — F419 Anxiety disorder, unspecified: Secondary | ICD-10-CM | POA: Diagnosis not present

## 2020-03-18 MED ORDER — CITALOPRAM HYDROBROMIDE 10 MG PO TABS: 10.0000 mg | ORAL_TABLET | Freq: Every day | ORAL | 1 refills | Status: DC

## 2020-03-18 MED ORDER — TRELEGY ELLIPTA 100-62.5-25 MCG/INH IN AEPB: 1.0000 | INHALATION_SPRAY | Freq: Every day | RESPIRATORY_TRACT | 3 refills | Status: DC

## 2020-03-18 NOTE — Progress Notes (Signed)
Virtual Visit via Video Note  I connected with Heidi [name] on 03/18/20 at  8:30 AM EDT by a video enabled telemedicine application and verified that I am speaking with the correct person using two identifiers.  Location patient: home Location provider:work or home office Persons participating in the virtual visit: patient, provider, daughter in law [name])   I discussed the limitations of evaluation and management by telemedicine and the availability of in person appointments. The patient expressed understanding and agreed to proceed.   HPI: 79 year old female who is being evaluated today for anxiety and mood disorder.  She reports that over the last few months she has been getting more aggravated at things she cannot do around the house.  She also becomes agitated at her kids because they will say they are going to do something and then they do not end up doing it.  When she becomes upset she starts having "tremors" and has to sit down, take some deep breaths and the tremors eventually resolved.  In the past she was on Ativan for anxiety but is no longer taking this due to being on Restoril for insomnia.  She is wondering if there is anything else that she can take  Additionally, she needs her Trelegy inhaler sent to mail order pharmacy   ROS: See pertinent positives and negatives per HPI.  Past Medical History:  Diagnosis Date  . Arthritis   . Cerebrovascular disease   . Chronic diastolic CHF (congestive heart failure) ([place]) 02/23/2016  . COPD (chronic obstructive pulmonary disease) ([place])   . Depression   . Diverticulosis   . Esophageal stricture   . Generalized anxiety disorder   . History of pneumonia   . History of stroke    mild oncoming  . 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 Hyperlipidemia   . Hypothyroidism   . Internal hemorrhoids   . Osteoporosis   . Pneumonia    HX of  . Positive ANA (antinuclear antibody)   . Pulmonary nodule   .  Rectal ulcer   . Shortness of breath    periodically with COPD flair up  . 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[facility])     Past Surgical History:  Procedure Laterality Date  . BLADDER SURGERY     tack  . CATARACT EXTRACTION     bilateral  . COLONOSCOPY  06/22/2011   Procedure: COLONOSCOPY;  Surgeon: Lafayette Dragon, MD;  Location: [facility];  Service: Endoscopy;  Laterality: N/A;  . GASTRIC VARICES BANDING  06/22/2011   Procedure: HEMORRHOID BANDING;  Surgeon: Lafayette Dragon, MD;  Location: [facility] ENDOSCOPY;  Service: Endoscopy;  Laterality: N/A;  . HEMICOLECTOMY  2012   emergent surgery [facility]  . HIATAL HERNIA REPAIR     x 2  . TONSILLECTOMY    . TOTAL ABDOMINAL HYSTERECTOMY W/ BILATERAL SALPINGOOPHORECTOMY  1998    Family History  Problem Relation Age of Onset  . Colon cancer Mother   . COPD Father   . Heart attack Father   . Stroke Maternal Aunt   . Colon polyps Brother   . Liver disease Maternal Aunt   . Breast cancer Maternal Aunt   . Hypertension Son   . Hypothyroidism Daughter        Current Outpatient Medications:  .  albuterol (VENTOLIN HFA) 108 (90 Base) MCG/ACT inhaler, Inhale 2 puffs into the lungs every 6 (six) hours as needed for wheezing or shortness of breath., Disp: 8  g, Rfl: 2 .  alendronate (FOSAMAX) 70 MG tablet, TAKE 1 TABLET BY MOUTH ONCE A WEEK IN THE MORNING WITH A GLASS OF WATER. DO NOT EAT OR LIE DOWN FOR 30 MINUTES., Disp: 12 tablet, Rfl: 1 .  Fluticasone-Umeclidin-Vilant (TRELEGY ELLIPTA) 100-62.5-25 MCG/INH AEPB, Inhale 1 puff into the lungs daily., Disp: 28 each, Rfl: 3 .  gabapentin (NEURONTIN) 300 MG capsule, TAKE ONE CAPSULE BY MOUTH TWICE A DAY, Disp: 180 capsule, Rfl: 1 .  ipratropium-albuterol (DUONEB) 0.5-2.5 (3) MG/3ML SOLN, Take 3 mLs by nebulization 3 (three) times daily., Disp: 120 mL, Rfl: 3 .  levothyroxine (SYNTHROID) 75 MCG tablet, TAKE ONE (1) TABLET ONCE DAILY BEFORE BREAKFAST, Disp: 90 tablet, Rfl: 1 .  LORazepam (ATIVAN) 1 MG tablet,  TAKE 1/2 TO 1 TABLET BY MOUTH 3 TIMES DAILY AS NEEDED, Disp: 180 tablet, Rfl: 0 .  methylPREDNISolone (MEDROL) 4 MG tablet, TAKE ONE TABLET BY MOUTH EVERY OTHER DAY, Disp: 60 tablet, Rfl: 1 .  naproxen sodium (ALEVE) 220 MG tablet, Take 220 mg by mouth daily as needed., Disp: , Rfl:  .  pseudoephedrine-guaifenesin (MUCINEX D) 60-600 MG 12 hr tablet, Take 1 tablet by mouth 2 (two) times daily as needed for congestion., Disp: , Rfl:  .  simvastatin (ZOCOR) 20 MG tablet, TAKE ONE (1) TABLET AT BEDTIME, Disp: 90 tablet, Rfl: 3 .  temazepam (RESTORIL) 15 MG capsule, TAKE ONE (1) CAPSULE BY MOUTH AT BEDTIMEAS NEEDED FOR SLEEP, Disp: 30 capsule, Rfl: 2  EXAM:  VITALS per patient if applicable:  GENERAL: alert, oriented, appears well and in no acute distress  HEENT: atraumatic, conjunttiva clear, no obvious abnormalities on inspection of external nose and ears  NECK: normal movements of the head and neck  LUNGS: on inspection no signs of respiratory distress, breathing rate appears normal, no obvious gross SOB, gasping or wheezing  CV: no obvious cyanosis  MS: moves all visible extremities without noticeable abnormality  PSYCH/NEURO: pleasant and cooperative, no obvious depression or anxiety, speech and thought processing grossly intact  ASSESSMENT AND PLAN:  Discussed the following assessment and plan:  1. Anxiety -We will stay away from any additional benzos at this time.  We will start her on Celexa 10 mg daily.  She and her daughter-in-law were advised that this may take 2 to 4 weeks to work completely.  Advise follow-up if no improvement in a month  2. COPD exacerbation ([place])  - Fluticasone-Umeclidin-Vilant (TRELEGY ELLIPTA) 100-62.5-25 MCG/INH AEPB; Inhale 1 puff into the lungs daily.  Dispense: 84 each; Refill: 3     I discussed the assessment and treatment plan with the patient. The patient was provided an opportunity to ask questions and all were answered. The patient agreed  with the plan and demonstrated an understanding of the instructions.   The patient was advised to call back or seek an in-person evaluation if the symptoms worsen or if the condition fails to improve as anticipated.   Dorothyann Peng, NP

## 2020-03-19 ENCOUNTER — Other Ambulatory Visit: Payer: Self-pay

## 2020-03-19 DIAGNOSIS — J441 Chronic obstructive pulmonary disease with (acute) exacerbation: Secondary | ICD-10-CM

## 2020-03-19 MED ORDER — TRELEGY ELLIPTA 100-62.5-25 MCG/INH IN AEPB: 1.0000 | INHALATION_SPRAY | Freq: Every day | RESPIRATORY_TRACT | 3 refills | Status: DC

## 2020-04-27 ENCOUNTER — Other Ambulatory Visit: Payer: Self-pay | Admitting: Adult Health

## 2020-04-27 DIAGNOSIS — G4700 Insomnia, unspecified: Secondary | ICD-10-CM

## 2020-06-09 ENCOUNTER — Encounter: Payer: Self-pay | Admitting: Adult Health

## 2020-06-09 ENCOUNTER — Ambulatory Visit (INDEPENDENT_AMBULATORY_CARE_PROVIDER_SITE_OTHER): Payer: Medicare Other | Admitting: Adult Health

## 2020-06-09 ENCOUNTER — Other Ambulatory Visit: Payer: Self-pay

## 2020-06-09 VITALS — BP 124/70 | HR 85 | Temp 98.4°F | Resp 16 | Wt 83.8 lb

## 2020-06-09 DIAGNOSIS — J449 Chronic obstructive pulmonary disease, unspecified: Secondary | ICD-10-CM | POA: Diagnosis not present

## 2020-06-09 DIAGNOSIS — M19042 Primary osteoarthritis, left hand: Secondary | ICD-10-CM | POA: Diagnosis not present

## 2020-06-09 DIAGNOSIS — F39 Unspecified mood [affective] disorder: Secondary | ICD-10-CM | POA: Diagnosis not present

## 2020-06-09 DIAGNOSIS — Z23 Encounter for immunization: Secondary | ICD-10-CM | POA: Diagnosis not present

## 2020-06-09 DIAGNOSIS — M19041 Primary osteoarthritis, right hand: Secondary | ICD-10-CM

## 2020-06-09 MED ORDER — PREDNISONE 5 MG PO TABS: 5.0000 mg | ORAL_TABLET | Freq: Every day | ORAL | 3 refills | Status: DC

## 2020-06-09 MED ORDER — ESCITALOPRAM OXALATE 5 MG PO TABS: 5.0000 mg | ORAL_TABLET | Freq: Every day | ORAL | 0 refills | Status: DC

## 2020-06-09 MED ORDER — TRELEGY ELLIPTA 100-62.5-25 MCG/INH IN AEPB: 1.0000 | INHALATION_SPRAY | Freq: Every day | RESPIRATORY_TRACT | 6 refills | Status: DC

## 2020-06-09 NOTE — Patient Instructions (Addendum)
It was great seeing you today   I am going to start you on prednisone 5 mg daily to see if this helps with the arthritis  I am also going to put you on another nerve pill called Lexapro - this is a low dose   I want you to follow up in one month or so ( schedule the appointment at the front desk right before my lunch break) and we will remove that bump on on your head

## 2020-06-09 NOTE — Progress Notes (Signed)
Subjective:    Patient ID: Heidi [name], female    DOB: September 21, 1940, 79 y.o.   MRN: [id number]  HPI 79 year old female who  has a past medical history of Arthritis, Cerebrovascular disease, Chronic diastolic CHF (congestive heart failure) ([place]) (02/23/2016), COPD (chronic obstructive pulmonary disease) ([place]), Depression, Diverticulosis, Esophageal stricture, Generalized anxiety disorder, History of pneumonia, History of stroke, adenomatous colonic polyps, Hyperlipidemia, Hypothyroidism, Internal hemorrhoids, Osteoporosis, Pneumonia, Positive ANA (antinuclear antibody), Pulmonary nodule, Rectal ulcer, Shortness of breath, and 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([place]).   She presents with her daughter in law today with multiple issues to discuss  Anxiety and Mood Disorder -she was seen for this back in September 2021, at this time she was getting more aggravated at things at she could not control such as doing things around the house as well as with family members not doing what they were supposed to do.  She was prescribed Celexa 10 mg and reports that she took it for about 17 days but then stopped it because every time she took it she would get "shakes in my hands and upper body really bad".  Her daughter-in-law reports that patient continues to feel anxious and be agitated and nearly everything.  She also has a history of COPD and is on chronic oxygen therapy via nasal cannula.  She is prescribed an albuterol inhaler as well as Trelegy Ellipta, her Trelegy Ellipta inhaler was sent in in September but she has never see if this inhaler.  She does report some mild increased shortness of breath  She has also complaining of worsening arthritic pain.  She is currently using over-the-counter Aleve as well as Medrol 4 mg every other day but feels as though arthritic pain especially in her hands is getting worse.  Review of Systems See HPI   Past Medical History:  Diagnosis Date   Arthritis    Cerebrovascular disease      Chronic diastolic CHF (congestive heart failure) ([place]) 02/23/2016   COPD (chronic obstructive pulmonary disease) ([facility])    Depression    Diverticulosis    Esophageal stricture    Generalized anxiety disorder    History of pneumonia    History of stroke    mild oncoming   Hx of adenomatous colonic polyps    with high grade dysplasia   Hyperlipidemia    Hypothyroidism    Internal hemorrhoids    Osteoporosis    Pneumonia    HX of   Positive ANA (antinuclear antibody)    Pulmonary nodule    Rectal ulcer    Shortness of breath    periodically with COPD flair up   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([place])     Social History   Socioeconomic History   Marital status: Widowed    Spouse name: Not on file   Number of children: 3   Years of education: Not on file   Highest education level: Not on file  Occupational History   Occupation: RETIRED    Employer: RETIRED  Tobacco Use   Smoking status: Former Smoker    Packs/day: 2.50    Years: 50.00    Pack years: 125.00    Quit date: 11/01/2009    Years since quitting: 10.6   Smokeless tobacco: Never Used  Vaping Use   Vaping Use: Never used  Substance and Sexual Activity   Alcohol use: No   Drug use: No   Sexual activity: Not Currently  Other Topics Concern   Not on file  Social History  Narrative   Patient has no recent travel. She is smoking 2 packs per day and has smoked since age of 78. Has cats and dogs at home. No bird or mold exposure.       dtr with temporary    Social Determinants of Health   Financial Resource Strain:    Difficulty of Paying Living Expenses: Not on file  Food Insecurity:    Worried About Charity fundraiser in the Last Year: Not on file   [employer] of Food in the Last Year: Not on file  Transportation Needs:    Lack of Transportation (Medical): Not on file   Lack of Transportation (Non-Medical): Not on file  Physical Activity:    Days of Exercise per Week: Not on file    Minutes of Exercise per Session: Not on file  Stress:    Feeling of Stress : Not on file  Social Connections:    Frequency of Communication with Friends and Family: Not on file   Frequency of Social Gatherings with Friends and Family: Not on file   Attends Religious Services: Not on file   Active Member of Clubs or Organizations: Not on file   Attends Archivist Meetings: Not on file   Marital Status: Not on file  Intimate Partner Violence:    Fear of Current or Ex-Partner: Not on file   Emotionally Abused: Not on file   Physically Abused: Not on file   Sexually Abused: Not on file    Past Surgical History:  Procedure Laterality Date   BLADDER SURGERY     tack   CATARACT EXTRACTION     bilateral   COLONOSCOPY  06/22/2011   Procedure: COLONOSCOPY;  Surgeon: Lafayette Dragon, MD;  Location: [facility];  Service: Endoscopy;  Laterality: N/A;   GASTRIC VARICES BANDING  06/22/2011   Procedure: HEMORRHOID BANDING;  Surgeon: Lafayette Dragon, MD;  Location: [facility];  Service: Endoscopy;  Laterality: N/A;   HEMICOLECTOMY  2012   emergent surgery [facility]   HIATAL HERNIA REPAIR     x 2   TONSILLECTOMY     TOTAL ABDOMINAL HYSTERECTOMY W/ BILATERAL SALPINGOOPHORECTOMY  1998    Family History  Problem Relation Age of Onset   Colon cancer Mother    COPD Father    Heart attack Father    Stroke Maternal Aunt    Colon polyps Brother    Liver disease Maternal Aunt    Breast cancer Maternal Aunt    Hypertension Son    Hypothyroidism Daughter     Allergies  Allergen Reactions   Morphine And Related Nausea And Vomiting   Penicillins Hives    Has patient had a PCN reaction causing immediate rash, facial/tongue/throat swelling, SOB or lightheadedness with hypotension: No Has patient had a PCN reaction causing severe rash involving mucus membranes or skin necrosis: No Has patient had a PCN reaction that required hospitalization No Has  patient had a PCN reaction occurring within the last 10 years: No If all of the above answers are "NO", then may proceed with Cephalosporin use.    Current Outpatient Medications on File Prior to Visit  Medication Sig Dispense Refill   albuterol (VENTOLIN HFA) 108 (90 Base) MCG/ACT inhaler Inhale 2 puffs into the lungs every 6 (six) hours as needed for wheezing or shortness of breath. 8 g 2   alendronate (FOSAMAX) 70 MG tablet TAKE 1 TABLET BY MOUTH ONCE A WEEK IN THE MORNING WITH A GLASS  OF WATER. DO NOT EAT OR LIE DOWN FOR 30 MINUTES. 12 tablet 1   citalopram (CELEXA) 10 MG tablet Take 1 tablet (10 mg total) by mouth daily. 90 tablet 1   gabapentin (NEURONTIN) 300 MG capsule TAKE ONE CAPSULE BY MOUTH TWICE A DAY 180 capsule 1   ipratropium-albuterol (DUONEB) 0.5-2.5 (3) MG/3ML SOLN Take 3 mLs by nebulization 3 (three) times daily. 120 mL 3   levothyroxine (SYNTHROID) 75 MCG tablet TAKE ONE (1) TABLET ONCE DAILY BEFORE BREAKFAST 90 tablet 1   naproxen sodium (ALEVE) 220 MG tablet Take 220 mg by mouth daily as needed.     pseudoephedrine-guaifenesin (MUCINEX D) 60-600 MG 12 hr tablet Take 1 tablet by mouth 2 (two) times daily as needed for congestion.     simvastatin (ZOCOR) 20 MG tablet TAKE ONE TABLET BY MOUTH AT BEDTIME 90 tablet 3   temazepam (RESTORIL) 15 MG capsule TAKE ONE CAPSULE BY MOUTH AT BEDTIME AS NEEDED FOR SLEEP 30 capsule 2   No current facility-administered medications on file prior to visit.    BP 124/70    Pulse 85    Temp 98.4 F (36.9 C) (Oral)    Resp 16    Wt 83 lb 12.8 oz (38 kg)    BMI 14.38 kg/m       Objective:   Physical Exam Vitals and nursing note reviewed.  Constitutional:      Appearance: Normal appearance.  Cardiovascular:     Rate and Rhythm: Normal rate and regular rhythm.     Pulses: Normal pulses.     Heart sounds: Normal heart sounds.  Pulmonary:     Effort: Pulmonary effort is normal.     Breath sounds: Normal breath sounds.    Musculoskeletal:        General: Deformity (bilateral hands/fingers from arthritis ) present. Normal range of motion.  Skin:    General: Skin is warm and dry.  Neurological:     General: No focal deficit present.     Mental Status: She is alert and oriented to person, place, and time.  Psychiatric:        Mood and Affect: Mood normal.        Behavior: Behavior normal.        Thought Content: Thought content normal.        Judgment: Judgment normal.           Assessment & Plan:  1. Mood disorder ([place]) -I will her on Lexapro 5 mg daily.  She plans on following up in roughly 1 month to have a cyst removed from her head at which time we will follow-up on this as well. - escitalopram (LEXAPRO) 5 MG tablet; Take 1 tablet (5 mg total) by mouth daily.  Dispense: 90 tablet; Refill: 0  2. Need for influenza vaccination * - Flu Vaccine QUAD High Dose(Fluad)  3. COPD mixed type ([place])  - predniSONE (DELTASONE) 5 MG tablet; Take 1 tablet (5 mg total) by mouth daily with breakfast.  Dispense: 90 tablet; Refill: 3 - Fluticasone-Umeclidin-Vilant (TRELEGY ELLIPTA) 100-62.5-25 MCG/INH AEPB; Inhale 1 puff into the lungs daily.  Dispense: 60 each; Refill: 6  4. Primary osteoarthritis of both hands -We will have her take prednisone 5 mg daily. - predniSONE (DELTASONE) 5 MG tablet; Take 1 tablet (5 mg total) by mouth daily with breakfast.  Dispense: 90 tablet; Refill: 3   Dorothyann Peng, NP

## 2020-06-23 ENCOUNTER — Other Ambulatory Visit: Payer: Self-pay | Admitting: Adult Health

## 2020-06-23 ENCOUNTER — Telehealth: Payer: Self-pay | Admitting: Adult Health

## 2020-06-23 MED ORDER — AZITHROMYCIN 250 MG PO TABS: ORAL_TABLET | ORAL | 0 refills | Status: DC

## 2020-06-23 NOTE — Telephone Encounter (Signed)
Spoke with Heidi [name] who's calling regarding patient per Heidi [name] patient have had a cough with some yellow mucus. They are not wanting to come in [name] states that patient was seen about 2 weeks ago and they are not wanting to come back in. Calling for Z-pack to be sent to patients pharmacy. Please advise.

## 2020-06-23 NOTE — Telephone Encounter (Signed)
[name] call and stated she need Tommi Rumps to call in a Z-Pack for pt to  [place], [place] - [place] Phone:  [phone]  Fax:  [phone]

## 2020-06-25 NOTE — Telephone Encounter (Signed)
Rx sent in

## 2020-06-29 ENCOUNTER — Ambulatory Visit (INDEPENDENT_AMBULATORY_CARE_PROVIDER_SITE_OTHER): Payer: Medicare Other

## 2020-06-29 DIAGNOSIS — Z23 Encounter for immunization: Secondary | ICD-10-CM

## 2020-06-29 NOTE — Progress Notes (Signed)
   Covid-19 Vaccination Clinic  Name:  Heidi [name]    MRN: [id number] DOB: 05-Feb-1941  06/29/2020  Heidi [name] was observed post Covid-19 immunization for 15 minutes without incident. She was provided with Vaccine Information Sheet and instruction to access the V-Safe system.   Heidi [name] was instructed to call 911 with any severe reactions post vaccine: Marland Kitchen Difficulty breathing  . Swelling of face and throat  . A fast heartbeat  . A bad rash all over body  . Dizziness and weakness

## 2020-07-13 ENCOUNTER — Ambulatory Visit (INDEPENDENT_AMBULATORY_CARE_PROVIDER_SITE_OTHER): Payer: Medicare Other | Admitting: Adult Health

## 2020-07-13 ENCOUNTER — Other Ambulatory Visit: Payer: Self-pay

## 2020-07-13 ENCOUNTER — Other Ambulatory Visit: Payer: Self-pay | Admitting: Adult Health

## 2020-07-13 ENCOUNTER — Encounter: Payer: Self-pay | Admitting: Adult Health

## 2020-07-13 VITALS — BP 124/70 | HR 96 | Temp 98.2°F | Ht 64.0 in | Wt 81.6 lb

## 2020-07-13 DIAGNOSIS — L7211 Pilar cyst: Secondary | ICD-10-CM

## 2020-07-13 DIAGNOSIS — M810 Age-related osteoporosis without current pathological fracture: Secondary | ICD-10-CM

## 2020-07-13 MED ORDER — DOXYCYCLINE HYCLATE 100 MG PO CAPS: 100.0000 mg | ORAL_CAPSULE | Freq: Two times a day (BID) | ORAL | 0 refills | Status: DC

## 2020-07-13 NOTE — Progress Notes (Signed)
Subjective:    Patient ID: Heidi [name], female    DOB: March 23, 1941, 79 y.o.   MRN: [id number]  HPI 79 year old female who  has a past medical history of Arthritis, Cerebrovascular disease, Chronic diastolic CHF (congestive heart failure) ([place]) (02/23/2016), COPD (chronic obstructive pulmonary disease) ([place]), Depression, Diverticulosis, Esophageal stricture, Generalized anxiety disorder, History of pneumonia, History of stroke, adenomatous colonic polyps, Hyperlipidemia, Hypothyroidism, Internal hemorrhoids, Osteoporosis, Pneumonia, Positive ANA (antinuclear antibody), Pulmonary nodule, Rectal ulcer, Shortness of breath, and 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([place]).  She presents to the office today for removal of cyst on right back scalp.  Cyst has been present for many years.  She does endorse some discomfort at times.  Denies drainage, redness, or warmth   Review of Systems See HPI   Past Medical History:  Diagnosis Date   Arthritis    Cerebrovascular disease    Chronic diastolic CHF (congestive heart failure) ([place]) 02/23/2016   COPD (chronic obstructive pulmonary disease) ([facility])    Depression    Diverticulosis    Esophageal stricture    Generalized anxiety disorder    History of pneumonia    History of stroke    mild oncoming   Hx of adenomatous colonic polyps    with high grade dysplasia   Hyperlipidemia    Hypothyroidism    Internal hemorrhoids    Osteoporosis    Pneumonia    HX of   Positive ANA (antinuclear antibody)    Pulmonary nodule    Rectal ulcer    Shortness of breath    periodically with COPD flair up   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([place])     Social History   Socioeconomic History   Marital status: Widowed    Spouse name: Not on file   Number of children: 3   Years of education: Not on file   Highest education level: Not on file  Occupational History   Occupation: RETIRED    Employer: RETIRED  Tobacco Use   Smoking status: Former Smoker    Packs/day:  2.50    Years: 50.00    Pack years: 125.00    Quit date: 11/01/2009    Years since quitting: 10.7   Smokeless tobacco: Never Used  Vaping Use   Vaping Use: Never used  Substance and Sexual Activity   Alcohol use: No   Drug use: No   Sexual activity: Not Currently  Other Topics Concern   Not on file  Social History Narrative   Patient has no recent travel. She is smoking 2 packs per day and has smoked since age of 54. Has cats and dogs at home. No bird or mold exposure.       dtr with temporary    Social Determinants of Health   Financial Resource Strain: Not on file  Food Insecurity: Not on file  Transportation Needs: Not on file  Physical Activity: Not on file  Stress: Not on file  Social Connections: Not on file  Intimate Partner Violence: Not on file    Past Surgical History:  Procedure Laterality Date   BLADDER SURGERY     tack   CATARACT EXTRACTION     bilateral   COLONOSCOPY  06/22/2011   Procedure: COLONOSCOPY;  Surgeon: Lafayette Dragon, MD;  [facility];  Service: Endoscopy;  Laterality: N/A;   GASTRIC VARICES BANDING  06/22/2011   Procedure: HEMORRHOID BANDING;  Surgeon: Lafayette Dragon, MD;  [facility];  Service: Endoscopy;  Laterality: N/A;   HEMICOLECTOMY  2012   emergent surgery [facility]   HIATAL HERNIA REPAIR     x 2   TONSILLECTOMY     TOTAL ABDOMINAL HYSTERECTOMY W/ BILATERAL SALPINGOOPHORECTOMY  1998    Family History  Problem Relation Age of Onset   Colon cancer Mother    COPD Father    Heart attack Father    Stroke Maternal Aunt    Colon polyps Brother    Liver disease Maternal Aunt    Breast cancer Maternal Aunt    Hypertension Son    Hypothyroidism Daughter     Allergies  Allergen Reactions   Morphine And Related Nausea And Vomiting   Penicillins Hives    Has patient had a PCN reaction causing immediate rash, facial/tongue/throat swelling, SOB or lightheadedness with hypotension:  No Has patient had a PCN reaction causing severe rash involving mucus membranes or skin necrosis: No Has patient had a PCN reaction that required hospitalization No Has patient had a PCN reaction occurring within the last 10 years: No If all of the above answers are "NO", then may proceed with Cephalosporin use.    Current Outpatient Medications on File Prior to Visit  Medication Sig Dispense Refill   albuterol (VENTOLIN HFA) 108 (90 Base) MCG/ACT inhaler Inhale 2 puffs into the lungs every 6 (six) hours as needed for wheezing or shortness of breath. 8 g 2   alendronate (FOSAMAX) 70 MG tablet TAKE 1 TABLET BY MOUTH ONCE A WEEK IN THE MORNING WITH A GLASS OF WATER. DO NOT EAT OR LIE DOWN FOR 30 MINUTES. 12 tablet 1   citalopram (CELEXA) 10 MG tablet Take 1 tablet (10 mg total) by mouth daily. 90 tablet 1   escitalopram (LEXAPRO) 5 MG tablet Take 1 tablet (5 mg total) by mouth daily. 90 tablet 0   Fluticasone-Umeclidin-Vilant (TRELEGY ELLIPTA) 100-62.5-25 MCG/INH AEPB Inhale 1 puff into the lungs daily. 60 each 6   gabapentin (NEURONTIN) 300 MG capsule TAKE ONE CAPSULE BY MOUTH TWICE A DAY 180 capsule 1   ipratropium-albuterol (DUONEB) 0.5-2.5 (3) MG/3ML SOLN Take 3 mLs by nebulization 3 (three) times daily. 120 mL 3   levothyroxine (SYNTHROID) 75 MCG tablet TAKE ONE (1) TABLET ONCE DAILY BEFORE BREAKFAST 90 tablet 1   naproxen sodium (ALEVE) 220 MG tablet Take 220 mg by mouth daily as needed.     predniSONE (DELTASONE) 5 MG tablet Take 1 tablet (5 mg total) by mouth daily with breakfast. 90 tablet 3   pseudoephedrine-guaifenesin (MUCINEX D) 60-600 MG 12 hr tablet Take 1 tablet by mouth 2 (two) times daily as needed for congestion.     simvastatin (ZOCOR) 20 MG tablet TAKE ONE TABLET BY MOUTH AT BEDTIME 90 tablet 3   temazepam (RESTORIL) 15 MG capsule TAKE ONE CAPSULE BY MOUTH AT BEDTIME AS NEEDED FOR SLEEP 30 capsule 2   No current facility-administered medications on file prior to  visit.    BP 124/70 (BP Location: Left Arm, Patient Position: Sitting, Cuff Size: Normal)    Pulse 96    Temp 98.2 F (36.8 C) (Oral)    Ht 5\' 4"  (1.626 m)    Wt 81 lb 9.6 oz (37 kg)    BMI 14.01 kg/m       Objective:   Physical Exam Vitals and nursing note reviewed.  Constitutional:      Appearance: Normal appearance.  HENT:     Head:   Neurological:     General: No focal deficit present.  Mental Status: She is alert and oriented to person, place, and time.  Psychiatric:        Mood and Affect: Mood normal.        Behavior: Behavior normal.        Thought Content: Thought content normal.        Judgment: Judgment normal.       Assessment & Plan:  1. Pilar cyst Procedure:  Incision and removal of pilar cyst Risks, benefits, and alternatives explained and consent obtained. Time out conducted. Surface cleaned with betadine  3cc lidocaine with epinephine infiltrated around cyst Adequate anesthesia ensured. Area prepped and draped in a sterile fashion. #15 blade used to make an incision into cyst Sebaceous material expressed Curved hemostat used to explore 4 quadrants and loculations broken up. Further purulence expressed. Sac was removed Surgical glue used to close incision Hemostasis achieved. Pt stable. Aftercare and follow-up advised.  - doxycycline (VIBRAMYCIN) 100 MG capsule; Take 1 capsule (100 mg total) by mouth 2 (two) times daily.  Dispense: 14 capsule; Refill: 0   Dorothyann Peng, NP

## 2020-07-21 ENCOUNTER — Other Ambulatory Visit: Payer: Self-pay | Admitting: Adult Health

## 2020-07-21 DIAGNOSIS — G4700 Insomnia, unspecified: Secondary | ICD-10-CM

## 2020-08-04 ENCOUNTER — Other Ambulatory Visit: Payer: Self-pay | Admitting: Adult Health

## 2020-08-04 NOTE — Telephone Encounter (Signed)
Appt on 08/05/20

## 2020-08-05 ENCOUNTER — Encounter: Payer: Medicare Other | Admitting: Adult Health

## 2020-09-07 ENCOUNTER — Other Ambulatory Visit: Payer: Self-pay | Admitting: Adult Health

## 2020-09-07 DIAGNOSIS — F39 Unspecified mood [affective] disorder: Secondary | ICD-10-CM

## 2020-09-09 NOTE — Telephone Encounter (Signed)
SENT TO THE PHARMACY BY E-SCRIBE FOR 30 DAYS.  PT IS DUE FOR CPX.

## 2020-09-14 ENCOUNTER — Other Ambulatory Visit: Payer: Self-pay | Admitting: Adult Health

## 2020-09-14 DIAGNOSIS — E039 Hypothyroidism, unspecified: Secondary | ICD-10-CM

## 2020-09-27 ENCOUNTER — Telehealth: Payer: Self-pay | Admitting: Adult Health

## 2020-09-27 NOTE — Telephone Encounter (Signed)
Patients sister Doylene Canning called to request a Zpack sent to the pharmacy.     [place], [place] - [place] Phone:  [phone]  Fax:  [phone]

## 2020-09-28 ENCOUNTER — Other Ambulatory Visit: Payer: Self-pay | Admitting: Adult Health

## 2020-09-28 MED ORDER — AZITHROMYCIN 250 MG PO TABS: ORAL_TABLET | ORAL | 0 refills | Status: DC

## 2020-09-28 NOTE — Telephone Encounter (Signed)
Spoke with the pts sister and informed her the pt would need a visit prior to an antibiotic being called in.  She stated Tommi Rumps is aware the pt cannot come in for visits at times and has sent this in previously.  [name] stated the pt has had a productive cough with yellow sputum for 1 week and she has tried Mucinex with no relief.  [name] denies the pt having a fever, or chills and stated the pt has had shortness of breath.  Message sent to PCP.

## 2020-10-04 DIAGNOSIS — T2026XA Burn of second degree of forehead and cheek, initial encounter: Secondary | ICD-10-CM | POA: Diagnosis not present

## 2020-10-04 DIAGNOSIS — J439 Emphysema, unspecified: Secondary | ICD-10-CM | POA: Diagnosis not present

## 2020-10-04 DIAGNOSIS — T2601XA Burn of right eyelid and periocular area, initial encounter: Secondary | ICD-10-CM | POA: Diagnosis not present

## 2020-10-04 DIAGNOSIS — J449 Chronic obstructive pulmonary disease, unspecified: Secondary | ICD-10-CM | POA: Diagnosis not present

## 2020-10-04 DIAGNOSIS — T2024XA Burn of second degree of nose (septum), initial encounter: Secondary | ICD-10-CM | POA: Diagnosis not present

## 2020-10-04 DIAGNOSIS — T2602XA Burn of left eyelid and periocular area, initial encounter: Secondary | ICD-10-CM | POA: Diagnosis not present

## 2020-10-04 DIAGNOSIS — Y26XXXA Exposure to smoke, fire and flames, undetermined intent, initial encounter: Secondary | ICD-10-CM | POA: Diagnosis not present

## 2020-10-04 DIAGNOSIS — Y999 Unspecified external cause status: Secondary | ICD-10-CM | POA: Diagnosis not present

## 2020-10-04 DIAGNOSIS — T2020XA Burn of second degree of head, face, and neck, unspecified site, initial encounter: Secondary | ICD-10-CM | POA: Diagnosis not present

## 2020-10-04 DIAGNOSIS — Z9981 Dependence on supplemental oxygen: Secondary | ICD-10-CM | POA: Diagnosis not present

## 2020-10-05 ENCOUNTER — Other Ambulatory Visit: Payer: Self-pay | Admitting: Adult Health

## 2020-10-05 DIAGNOSIS — F39 Unspecified mood [affective] disorder: Secondary | ICD-10-CM

## 2020-10-11 ENCOUNTER — Other Ambulatory Visit: Payer: Self-pay | Admitting: Adult Health

## 2020-10-11 DIAGNOSIS — Z9981 Dependence on supplemental oxygen: Secondary | ICD-10-CM | POA: Diagnosis not present

## 2020-10-11 DIAGNOSIS — F17210 Nicotine dependence, cigarettes, uncomplicated: Secondary | ICD-10-CM | POA: Diagnosis not present

## 2020-10-11 DIAGNOSIS — G4700 Insomnia, unspecified: Secondary | ICD-10-CM

## 2020-10-11 DIAGNOSIS — T2009XD Burn of unspecified degree of multiple sites of head, face, and neck, subsequent encounter: Secondary | ICD-10-CM | POA: Diagnosis not present

## 2020-10-11 DIAGNOSIS — T2000XA Burn of unspecified degree of head, face, and neck, unspecified site, initial encounter: Secondary | ICD-10-CM | POA: Diagnosis not present

## 2020-10-11 DIAGNOSIS — T310 Burns involving less than 10% of body surface: Secondary | ICD-10-CM | POA: Diagnosis not present

## 2020-10-11 DIAGNOSIS — R52 Pain, unspecified: Secondary | ICD-10-CM | POA: Diagnosis not present

## 2020-10-13 NOTE — Telephone Encounter (Signed)
Last office visit- 07/13/2020 Last refill- 07/21/2020--30 capsules-2 refill  No future visit has been scheduled

## 2020-10-25 ENCOUNTER — Other Ambulatory Visit: Payer: Self-pay | Admitting: Adult Health

## 2020-10-27 NOTE — Telephone Encounter (Signed)
Last office visit-07/13/20 Medication currently not on med list  No future visit has been scheduled

## 2020-11-02 ENCOUNTER — Other Ambulatory Visit: Payer: Self-pay | Admitting: Adult Health

## 2020-11-16 ENCOUNTER — Telehealth: Payer: Self-pay | Admitting: Adult Health

## 2020-11-16 NOTE — Telephone Encounter (Signed)
Patient calling stating she is unsure of what is the name of the medication she is to take 1 every other day. Patient stated she know it is out of the following three medicines:  -methyIPrednisolone (Medrol) 4mg  -Prednisone 5 mg -Escitalopram (Lexapro) 5mg    Please call patient [phone] to advise.

## 2020-11-17 NOTE — Telephone Encounter (Signed)
Patient informed of the message below.

## 2020-11-17 NOTE — Telephone Encounter (Signed)
Spoke with the pt and she stated she does not need refills.  She is concerned that the Rx for a medication that she used to take every other day now states to take every day.  Patient could not recall the exact name but stated this was given prior to the last visit on 12/28.  After reviewing the chart, it looks like the Rx for methylPREDNISolone (MEDROL) 4 MG tablet stated to take every other day and was changed during a visit on 9/24 when Prednisone 5mg  was given.  Message sent to PCP to verify how the pt should be taking the medication.

## 2020-11-17 NOTE — Telephone Encounter (Signed)
I have her taking the Medrol 4 mg DAILY now to help with both her arthritis and breathing issues. She does not need to take Prednisone 5 mg any longer

## 2020-12-06 ENCOUNTER — Ambulatory Visit: Payer: Medicare Other

## 2020-12-07 ENCOUNTER — Telehealth: Payer: Self-pay

## 2020-12-07 ENCOUNTER — Ambulatory Visit: Payer: Medicare Other

## 2020-12-07 NOTE — Telephone Encounter (Cosign Needed)
Waited on line x 15 minutes for virtual visit pt never logged on to visit.  While waiting attempted to call pt  Three times with answer.   Pt need to reschedule to next available appointment.

## 2020-12-07 NOTE — Progress Notes (Deleted)
Subjective:   Heidi [name] is a 80 y.o. female who presents for Medicare Annual (Subsequent) preventive examination.  I connected with Heidi [name]  today by telephone and verified that I am speaking with the correct person using two identifiers. Location patient: home Location provider: work Persons participating in the virtual visit: patient, provider.   I discussed the limitations, risks, security and privacy concerns of performing an evaluation and management service by telephone and the availability of in person appointments. I also discussed with the patient that there may be a patient responsible charge related to this service. The patient expressed understanding and verbally consented to this telephonic visit.    Interactive audio and video telecommunications were attempted between this provider and patient, however failed, due to patient having technical difficulties OR patient did not have access to video capability.  We continued and completed visit with audio only.     Review of Systems    n/a       Objective:    There were no vitals filed for this visit. There is no height or weight on file to calculate BMI.  Advanced Directives 04/13/2017 07/26/2016 04/19/2015 06/22/2011  Does Patient Have a Medical Advance Directive? Yes Yes No Patient does not have advance directive  Type of Advance Directive [place];Living will [place];Living will - -  Does patient want to make changes to medical advance directive? No - Patient declined No - Patient declined - -  Copy of [place] in Chart? No - copy requested - - -  Would patient like information on creating a medical advance directive? - - No - patient declined information -    Current Medications (verified) Outpatient Encounter Medications as of 12/07/2020  Medication Sig  . albuterol (VENTOLIN HFA) 108 (90 Base) MCG/ACT inhaler Inhale 2 puffs into the lungs every 6  (six) hours as needed for wheezing or shortness of breath.  Marland Kitchen alendronate (FOSAMAX) 70 MG tablet TAKE 1 TABLET BY MOUTH ONCE A WEEK IN THE MORNING WITH A GLASS OF WATER. DO NOT EAT OR LIE DOWN FOR 30 MINUTES.  Marland Kitchen azithromycin (ZITHROMAX Z-PAK) 250 MG tablet Take 2 tablets on Day 1.  Then take 1 tablet daily.  . citalopram (CELEXA) 10 MG tablet Take 1 tablet (10 mg total) by mouth daily.  Marland Kitchen doxycycline (VIBRAMYCIN) 100 MG capsule Take 1 capsule (100 mg total) by mouth 2 (two) times daily.  Marland Kitchen escitalopram (LEXAPRO) 5 MG tablet TAKE ONE (1) TABLET ONCE DAILY  . Fluticasone-Umeclidin-Vilant (TRELEGY ELLIPTA) 100-62.5-25 MCG/INH AEPB Inhale 1 puff into the lungs daily.  Marland Kitchen gabapentin (NEURONTIN) 300 MG capsule TAKE ONE CAPSULE BY MOUTH TWICE A DAY  . ipratropium-albuterol (DUONEB) 0.5-2.5 (3) MG/3ML SOLN Take 3 mLs by nebulization 3 (three) times daily.  Marland Kitchen levothyroxine (SYNTHROID) 75 MCG tablet TAKE ONE (1) TABLET ONCE DAILY BEFORE BREAKFAST  . methylPREDNISolone (MEDROL) 4 MG tablet Take one tablet by mouth every day  . naproxen sodium (ALEVE) 220 MG tablet Take 220 mg by mouth daily as needed.  . pseudoephedrine-guaifenesin (MUCINEX D) 60-600 MG 12 hr tablet Take 1 tablet by mouth 2 (two) times daily as needed for congestion.  . simvastatin (ZOCOR) 20 MG tablet TAKE ONE TABLET BY MOUTH AT BEDTIME  . temazepam (RESTORIL) 15 MG capsule TAKE 1 CAPSULE BY MOUTH AT BEDTIME AS NEEDED FOR SLEEP   No facility-administered encounter medications on file as of 12/07/2020.    Allergies (verified) Morphine and  related and Penicillins   History: Past Medical History:  Diagnosis Date  . Arthritis   . Cerebrovascular disease   . Chronic diastolic CHF (congestive heart failure) ([place]) 02/23/2016  . COPD (chronic obstructive pulmonary disease) ([place])   . Depression   . Diverticulosis   . Esophageal stricture   . Generalized anxiety disorder   . History of pneumonia   . History of stroke    mild oncoming  .  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 Hyperlipidemia   . Hypothyroidism   . Internal hemorrhoids   . Osteoporosis   . Pneumonia    HX of  . Positive ANA (antinuclear antibody)   . Pulmonary nodule   . Rectal ulcer   . Shortness of breath    periodically with COPD flair up  . 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[facility])    Past Surgical History:  Procedure Laterality Date  . BLADDER SURGERY     tack  . CATARACT EXTRACTION     bilateral  . COLONOSCOPY  06/22/2011   Procedure: COLONOSCOPY;  Surgeon: Lafayette Dragon, MD;  [facility];  Service: Endoscopy;  Laterality: N/A;  . GASTRIC VARICES BANDING  06/22/2011   Procedure: HEMORRHOID BANDING;  Surgeon: Lafayette Dragon, MD;  [facility];  Service: Endoscopy;  Laterality: N/A;  . HEMICOLECTOMY  2012   emergent surgery [facility]  . HIATAL HERNIA REPAIR     x 2  . TONSILLECTOMY    . TOTAL ABDOMINAL HYSTERECTOMY W/ BILATERAL SALPINGOOPHORECTOMY  1998   Family History  Problem Relation Age of Onset  . Colon cancer Mother   . COPD Father   . Heart attack Father   . Stroke Maternal Aunt   . Colon polyps Brother   . Liver disease Maternal Aunt   . Breast cancer Maternal Aunt   . Hypertension Son   . Hypothyroidism Daughter    Social History   Socioeconomic History  . Marital status: Widowed    Spouse name: Not on file  . Number of children: 3  . Years of education: Not on file  . Highest education level: Not on file  Occupational History  . Occupation: RETIRED    Employer: RETIRED  Tobacco Use  . Smoking status: Former Smoker    Packs/day: 2.50    Years: 50.00    Pack years: 125.00    Quit date: 11/01/2009    Years since quitting: 11.1  . Smokeless tobacco: Never Used  Vaping Use  . Vaping Use: Never used  Substance and Sexual Activity  . Alcohol use: No  . Drug use: No  . Sexual activity: Not Currently  Other Topics Concern  . Not on file  Social History Narrative   Patient has  no recent travel. She is smoking 2 packs per day and has smoked since age of 46. Has cats and dogs at home. No bird or mold exposure.       dtr with temporary    Social Determinants of Health   Financial Resource Strain: Not on file  Food Insecurity: Not on file  Transportation Needs: Not on file  Physical Activity: Not on file  Stress: Not on file  Social Connections: Not on file    Tobacco Counseling Counseling given: Not Answered   Clinical Intake:                 Diabetic?***         Activities of Daily  Living No flowsheet data found.  Patient Care Team: Dorothyann Peng, NP as PCP - General (Family Medicine)  Indicate any recent Medical Services you may have received from other than [facility] providers in the past year (date may be approximate).     Assessment:   This is a routine wellness examination for Timya.  Hearing/Vision screen No exam data present  Dietary issues and exercise activities discussed:    Goals Addressed   None    Depression Screen PHQ 2/9 Scores 12/06/2017 04/13/2017 07/26/2016 02/10/2016 02/17/2015 12/15/2014 12/15/2014  PHQ - 2 Score 2 3 0 0 0 6 6  PHQ- 9 Score 6 7 - - - 18 11    Fall Risk Fall Risk  02/13/2019 12/06/2017 04/13/2017 04/13/2017 07/26/2016  Falls in the past year? 0 No Yes Yes Yes  Comment Emmi Telephone Survey: data to providers prior to load - - - -  Number falls in past yr: - - 1 1 1   Injury with Fall? - - Yes Yes -  Risk Factor Category  - - - - -  Risk for fall due to : - Impaired balance/gait;Impaired mobility Impaired balance/gait;Impaired mobility - -  Follow up - - Falls evaluation completed;Education provided;Falls prevention discussed - Education provided    FALL RISK PREVENTION PERTAINING TO THE HOME:  Any stairs in or around the home? {YES/NO:21197} If so, are there any without handrails? {YES/NO:21197} Home free of loose throw rugs in walkways, pet beds, electrical cords, etc? {YES/NO:21197} Adequate  lighting in your home to reduce risk of falls? {YES/NO:21197}  ASSISTIVE DEVICES UTILIZED TO PREVENT FALLS:  Life alert? {YES/NO:21197} Use of a cane, walker or w/c? {YES/NO:21197} Grab bars in the bathroom? {YES/NO:21197} Shower chair or bench in shower? {YES/NO:21197} Elevated toilet seat or a handicapped toilet? {YES/NO:21197}  TIMED UP AND GO:  Was the test performed? {YES/NO:21197}.  Length of time to ambulate 10 feet: *** sec.   {Appearance of [id number]  Cognitive Function: MMSE - Mini Mental State Exam 07/26/2016  Not completed: (No Data)        Immunizations Immunization History  Administered Date(s) Administered  . Fluad Quad(high Dose 65+) 04/22/2019, 06/09/2020  . H1N1 06/30/2008  . Influenza Split 04/18/2012  . Influenza Whole 06/10/2007, 04/14/2010, 04/18/2012  . Influenza, High Dose Seasonal PF 06/02/2015, 04/10/2016, 04/11/2017, 04/25/2018  . Influenza,inj,Quad PF,6+ Mos 04/16/2013  . Moderna SARS-COV2 Booster Vaccination 06/29/2020  . Pneumococcal Conjugate-13 06/02/2015  . Pneumococcal Polysaccharide-23 05/17/2006, 04/16/2013  . Td 03/29/1999  . Tdap 04/16/2013  . Zoster 07/17/2013    {TDAP status:2101805}  {Flu Vaccine status:2101806}  {Pneumococcal vaccine status:2101807}  {Covid-19 vaccine status:2101808}  Qualifies for Shingles Vaccine? {YES/NO:21197}  Zostavax completed {YES/NO:21197}  {Shingrix Completed?:2101804}  Screening Tests Health Maintenance  Topic Date Due  . COVID-19 Vaccine (2 - Moderna 3-dose series) 07/27/2020  . INFLUENZA VACCINE  02/14/2021  . TETANUS/TDAP  04/17/2023  . DEXA SCAN  Completed  . Hepatitis C Screening  Completed  . PNA vac Low Risk Adult  Completed  . HPV VACCINES  Aged Out    Health Maintenance  Health Maintenance Due  Topic Date Due  . COVID-19 Vaccine (2 - Moderna 3-dose series) 07/27/2020    {Colorectal cancer screening:2101809}  {Mammogram status:21018020}  {Bone Density  status:21018021}  Lung Cancer Screening: (Low Dose CT Chest recommended if Age 67-80 years, 30 pack-year currently smoking OR have quit w/in 15years.) {DOES NOT does:27190::"does not"} qualify.   Lung Cancer Screening Referral: ***  Additional Screening:  Hepatitis C  Screening: {DOES NOT does:27190::"does not"} qualify; Completed ***  Vision Screening: Recommended annual ophthalmology exams for early detection of glaucoma and other disorders of the eye. Is the patient up to date with their annual eye exam?  {YES/NO:21197} Who is the provider or what is the name of the office in which the patient attends annual eye exams? *** If pt is not established with a provider, would they like to be referred to a provider to establish care? {YES/NO:21197}.   Dental Screening: Recommended annual dental exams for proper oral hygiene  Community Resource Referral / Chronic Care Management: CRR required this visit?  {YES/NO:21197}  CCM required this visit?  {YES/NO:21197}     Plan:     I have personally reviewed and noted the following in the patient's chart:   . Medical and social history . Use of alcohol, tobacco or illicit drugs  . Current medications and supplements including opioid prescriptions.  . Functional ability and status . Nutritional status . Physical activity . Advanced directives . List of other physicians . Hospitalizations, surgeries, and ER visits in previous 12 months . Vitals . Screenings to include cognitive, depression, and falls . Referrals and appointments  In addition, I have reviewed and discussed with patient certain preventive protocols, quality metrics, and best practice recommendations. A written personalized care plan for preventive services as well as general preventive health recommendations were provided to patient.     Randel Pigg, LPN   10/16/270   Nurse Notes: ***

## 2020-12-15 ENCOUNTER — Other Ambulatory Visit: Payer: Self-pay | Admitting: Adult Health

## 2020-12-15 DIAGNOSIS — E039 Hypothyroidism, unspecified: Secondary | ICD-10-CM

## 2020-12-15 DIAGNOSIS — F39 Unspecified mood [affective] disorder: Secondary | ICD-10-CM

## 2020-12-27 ENCOUNTER — Other Ambulatory Visit: Payer: Self-pay | Admitting: Adult Health

## 2020-12-27 DIAGNOSIS — M810 Age-related osteoporosis without current pathological fracture: Secondary | ICD-10-CM

## 2020-12-28 ENCOUNTER — Telehealth: Payer: Self-pay | Admitting: Adult Health

## 2020-12-28 NOTE — Telephone Encounter (Signed)
Left message for patient to call back and schedule Medicare Annual Wellness Visit (AWV) either virtually or in office.   Last AWV 07/26/16  please schedule at anytime with [facility] Nurse Health Advisor 1 or 2   This should be a 45 minute visit.

## 2021-01-03 ENCOUNTER — Other Ambulatory Visit: Payer: Self-pay | Admitting: Adult Health

## 2021-01-03 DIAGNOSIS — G4700 Insomnia, unspecified: Secondary | ICD-10-CM

## 2021-01-10 ENCOUNTER — Other Ambulatory Visit: Payer: Self-pay | Admitting: Adult Health

## 2021-01-10 DIAGNOSIS — G4700 Insomnia, unspecified: Secondary | ICD-10-CM

## 2021-01-11 ENCOUNTER — Telehealth: Payer: Self-pay | Admitting: Adult Health

## 2021-01-11 NOTE — Telephone Encounter (Signed)
Tried to call pt to let her know that Heidi [name] is aware of refill and refill if appropiate when he returns.

## 2021-01-11 NOTE — Telephone Encounter (Signed)
Okay for refill?    LOV  07/13/20   Last Refill   30    QTY.    2     Refills

## 2021-01-11 NOTE — Telephone Encounter (Signed)
Okay for refill?  LOV 06/2020  Last filled  12/11/20   30    Qty.     2    Refills   Please advise

## 2021-01-11 NOTE — Telephone Encounter (Signed)
Patient is needing a refill of her temazepam (RESTORIL) 15 MG capsule to be sent to [facility] [place], [place].  Please advise.

## 2021-01-29 ENCOUNTER — Other Ambulatory Visit: Payer: Self-pay | Admitting: Adult Health

## 2021-01-29 DIAGNOSIS — F39 Unspecified mood [affective] disorder: Secondary | ICD-10-CM

## 2021-02-01 ENCOUNTER — Other Ambulatory Visit: Payer: Self-pay | Admitting: Adult Health

## 2021-02-08 DIAGNOSIS — Z0000 Encounter for general adult medical examination without abnormal findings: Secondary | ICD-10-CM

## 2021-02-08 NOTE — Progress Notes (Deleted)
Subjective:   Heidi [name] is a 80 y.o. female who presents for Medicare Annual (Subsequent) preventive examination.  Review of Systems    ***       Objective:    There were no vitals filed for this visit. There is no height or weight on file to calculate BMI.  Advanced Directives 04/13/2017 07/26/2016 04/19/2015 06/22/2011  Does Patient Have a Medical Advance Directive? Yes Yes No Patient does not have advance directive  Type of Advance Directive [place];Living will [place];Living will - -  Does patient want to make changes to medical advance directive? No - Patient declined No - Patient declined - -  Copy of [place] in Chart? No - copy requested - - -  Would patient like information on creating a medical advance directive? - - No - patient declined information -    Current Medications (verified) Outpatient Encounter Medications as of 02/08/2021  Medication Sig   albuterol (VENTOLIN HFA) 108 (90 Base) MCG/ACT inhaler Inhale 2 puffs into the lungs every 6 (six) hours as needed for wheezing or shortness of breath.   alendronate (FOSAMAX) 70 MG tablet TAKE 1 TABLET BY MOUTH ONCE A WEEK IN THE MORNING WITH A GLASS OF WATER. DO NOT EAT OR LIE DOWN FOR 30 MINUTES.   azithromycin (ZITHROMAX Z-PAK) 250 MG tablet Take 2 tablets on Day 1.  Then take 1 tablet daily.   citalopram (CELEXA) 10 MG tablet Take 1 tablet (10 mg total) by mouth daily.   doxycycline (VIBRAMYCIN) 100 MG capsule Take 1 capsule (100 mg total) by mouth 2 (two) times daily.   escitalopram (LEXAPRO) 5 MG tablet TAKE ONE (1) TABLET ONCE DAILY   Fluticasone-Umeclidin-Vilant (TRELEGY ELLIPTA) 100-62.5-25 MCG/INH AEPB Inhale 1 puff into the lungs daily.   gabapentin (NEURONTIN) 300 MG capsule TAKE ONE CAPSULE BY MOUTH TWICE A DAY   ipratropium-albuterol (DUONEB) 0.5-2.5 (3) MG/3ML SOLN Take 3 mLs by nebulization 3 (three) times daily.   levothyroxine (SYNTHROID) 75  MCG tablet TAKE ONE (1) TABLET ONCE DAILY BEFORE BREAKFAST   methylPREDNISolone (MEDROL) 4 MG tablet Take one tablet by mouth every day   naproxen sodium (ALEVE) 220 MG tablet Take 220 mg by mouth daily as needed.   pseudoephedrine-guaifenesin (MUCINEX D) 60-600 MG 12 hr tablet Take 1 tablet by mouth 2 (two) times daily as needed for congestion.   simvastatin (ZOCOR) 20 MG tablet TAKE ONE TABLET BY MOUTH AT BEDTIME   temazepam (RESTORIL) 15 MG capsule TAKE 1 CAPSULE BY MOUTH AT BEDTIME AS NEEDED FOR SLEEP   No facility-administered encounter medications on file as of 02/08/2021.    Allergies (verified) Morphine and related and Penicillins   History: Past Medical History:  Diagnosis Date   Arthritis    Cerebrovascular disease    Chronic diastolic CHF (congestive heart failure) ([place]) 02/23/2016   COPD (chronic obstructive pulmonary disease) ([facility])    Depression    Diverticulosis    Esophageal stricture    Generalized anxiety disorder    History of pneumonia    History of stroke    mild oncoming   Hx of adenomatous colonic polyps    with high grade dysplasia   Hyperlipidemia    Hypothyroidism    Internal hemorrhoids    Osteoporosis    Pneumonia    HX of   Positive ANA (antinuclear antibody)    Pulmonary nodule    Rectal ulcer    Shortness of breath  periodically with COPD flair up   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[facility])    Past Surgical History:  Procedure Laterality Date   BLADDER SURGERY     tack   CATARACT EXTRACTION     bilateral   COLONOSCOPY  06/22/2011   Procedure: COLONOSCOPY;  Surgeon: Lafayette Dragon, MD;  [facility];  Service: Endoscopy;  Laterality: N/A;   GASTRIC VARICES BANDING  06/22/2011   Procedure: HEMORRHOID BANDING;  Surgeon: Lafayette Dragon, MD;  [facility];  Service: Endoscopy;  Laterality: N/A;   HEMICOLECTOMY  2012   emergent surgery [facility]   HIATAL HERNIA REPAIR     x 2   TONSILLECTOMY     TOTAL ABDOMINAL HYSTERECTOMY W/  BILATERAL SALPINGOOPHORECTOMY  1998   Family History  Problem Relation Age of Onset   Colon cancer Mother    COPD Father    Heart attack Father    Stroke Maternal Aunt    Colon polyps Brother    Liver disease Maternal Aunt    Breast cancer Maternal Aunt    Hypertension Son    Hypothyroidism Daughter    Social History   Socioeconomic History   Marital status: Widowed    Spouse name: Not on file   Number of children: 3   Years of education: Not on file   Highest education level: Not on file  Occupational History   Occupation: RETIRED    Employer: RETIRED  Tobacco Use   Smoking status: Former    Packs/day: 2.50    Years: 50.00    Pack years: 125.00    Types: Cigarettes    Quit date: 11/01/2009    Years since quitting: 11.2   Smokeless tobacco: Never  Vaping Use   Vaping Use: Never used  Substance and Sexual Activity   Alcohol use: No   Drug use: No   Sexual activity: Not Currently  Other Topics Concern   Not on file  Social History Narrative   Patient has no recent travel. She is smoking 2 packs per day and has smoked since age of 47. Has cats and dogs at home. No bird or mold exposure.       dtr with temporary    Social Determinants of Health   Financial Resource Strain: Not on file  Food Insecurity: Not on file  Transportation Needs: Not on file  Physical Activity: Not on file  Stress: Not on file  Social Connections: Not on file    Tobacco Counseling Counseling given: Not Answered   Clinical Intake:                 Diabetic?***         Activities of Daily Living No flowsheet data found.  Patient Care Team: Dorothyann Peng, NP as PCP - General (Family Medicine)  Indicate any recent Medical Services you may have received from other than [facility] providers in the past year (date may be approximate).     Assessment:   This is a routine wellness examination for Heidi [name].  Hearing/Vision screen No results found.  Dietary issues and  exercise activities discussed:     Goals Addressed   None    Depression Screen PHQ 2/9 Scores 12/06/2017 04/13/2017 07/26/2016 02/10/2016 02/17/2015 12/15/2014 12/15/2014  PHQ - 2 Score 2 3 0 0 0 6 6  PHQ- 9 Score 6 7 - - - 18 11    Fall Risk Fall Risk  02/13/2019 12/06/2017 04/13/2017 04/13/2017 07/26/2016  Falls in the past year? 0  No Yes Yes Yes  Comment Emmi Telephone Survey: data to providers prior to load - - - -  Number falls in past yr: - - '1 1 1  '$ Injury with Fall? - - Yes Yes -  Risk Factor Category  - - - - -  Risk for fall due to : - Impaired balance/gait;Impaired mobility Impaired balance/gait;Impaired mobility - -  Follow up - - Falls evaluation completed;Education provided;Falls prevention discussed - Education provided    FALL RISK PREVENTION PERTAINING TO THE HOME:  Any stairs in or around the home? {YES/NO:21197} If so, are there any without handrails? {YES/NO:21197} Home free of loose throw rugs in walkways, pet beds, electrical cords, etc? {YES/NO:21197} Adequate lighting in your home to reduce risk of falls? {YES/NO:21197}  ASSISTIVE DEVICES UTILIZED TO PREVENT FALLS:  Life alert? {YES/NO:21197} Use of a cane, walker or w/c? {YES/NO:21197} Grab bars in the bathroom? {YES/NO:21197} Shower chair or bench in shower? {YES/NO:21197} Elevated toilet seat or a handicapped toilet? {YES/NO:21197}  TIMED UP AND GO:  Was the test performed? {YES/NO:21197}.  Length of time to ambulate 10 feet: *** sec.   {Appearance of Gait:2101803}  Cognitive Function: MMSE - Mini Mental State Exam 07/26/2016  Not completed: (No Data)        Immunizations Immunization History  Administered Date(s) Administered   Fluad Quad(high Dose 65+) 04/22/2019, 06/09/2020   H1N1 06/30/2008   Influenza Split 04/18/2012   Influenza Whole 06/10/2007, 04/14/2010, 04/18/2012   Influenza, High Dose Seasonal PF 06/02/2015, 04/10/2016, 04/11/2017, 04/25/2018   Influenza,inj,Quad PF,6+ Mos  04/16/2013   Moderna SARS-COV2 Booster Vaccination 06/29/2020   Pneumococcal Conjugate-13 06/02/2015   Pneumococcal Polysaccharide-23 05/17/2006, 04/16/2013   Td 03/29/1999   Tdap 04/16/2013   Zoster, Live 07/17/2013    {TDAP status:2101805}  {Flu Vaccine status:2101806}  {Pneumococcal vaccine status:2101807}  {Covid-19 vaccine status:2101808}  Qualifies for Shingles Vaccine? {YES/NO:21197}  Zostavax completed {YES/NO:21197}  {Shingrix Completed?:2101804}  Screening Tests Health Maintenance  Topic Date Due   Zoster Vaccines- Shingrix (1 of 2) Never done   COVID-19 Vaccine (2 - Moderna series) 07/27/2020   INFLUENZA VACCINE  02/14/2021   TETANUS/TDAP  04/17/2023   DEXA SCAN  Completed   Hepatitis C Screening  Completed   PNA vac Low Risk Adult  Completed   HPV VACCINES  Aged Out    Health Maintenance  Health Maintenance Due  Topic Date Due   Zoster Vaccines- Shingrix (1 of 2) Never done   COVID-19 Vaccine (2 - Moderna series) 07/27/2020    {Colorectal cancer screening:2101809}  {Mammogram status:21018020}  {Bone Density status:21018021}  Lung Cancer Screening: (Low Dose CT Chest recommended if Age 59-80 years, 30 pack-year currently smoking OR have quit w/in 15years.) {DOES NOT does:27190::"does not"} qualify.   Lung Cancer Screening Referral: ***  Additional Screening:  Hepatitis C Screening: {DOES NOT does:27190::"does not"} qualify; Completed ***  Vision Screening: Recommended annual ophthalmology exams for early detection of glaucoma and other disorders of the eye. Is the patient up to date with their annual eye exam?  {YES/NO:21197} Who is the provider or what is the name of the office in which the patient attends annual eye exams? *** If pt is not established with a provider, would they like to be referred to a provider to establish care? {YES/NO:21197}.   Dental Screening: Recommended annual dental exams for proper oral hygiene  Community Resource  Referral / Chronic Care Management: CRR required this visit?  {YES/NO:21197}  CCM required this visit?  {YES/NO:21197}  Plan:     I have personally reviewed and noted the following in the patient's chart:   Medical and social history Use of alcohol, tobacco or illicit drugs  Current medications and supplements including opioid prescriptions.  Functional ability and status Nutritional status Physical activity Advanced directives List of other physicians Hospitalizations, surgeries, and ER visits in previous 12 months Vitals Screenings to include cognitive, depression, and falls Referrals and appointments  In addition, I have reviewed and discussed with patient certain preventive protocols, quality metrics, and best practice recommendations. A written personalized care plan for preventive services as well as general preventive health recommendations were provided to patient.     Randel Pigg, LPN   [phone]   Nurse Notes: ***

## 2021-02-10 ENCOUNTER — Telehealth: Payer: Self-pay

## 2021-02-10 NOTE — Telephone Encounter (Signed)
Three attempts to contact patient with no answer . Patient may reschedule next available appointment.  L.Jeffifer Rabold,LPN

## 2021-03-01 ENCOUNTER — Encounter: Payer: Medicare Other | Admitting: Adult Health

## 2021-03-14 ENCOUNTER — Other Ambulatory Visit: Payer: Self-pay | Admitting: Adult Health

## 2021-03-14 DIAGNOSIS — E039 Hypothyroidism, unspecified: Secondary | ICD-10-CM

## 2021-03-15 ENCOUNTER — Other Ambulatory Visit: Payer: Self-pay

## 2021-03-15 ENCOUNTER — Telehealth: Payer: Self-pay | Admitting: Adult Health

## 2021-03-15 DIAGNOSIS — E039 Hypothyroidism, unspecified: Secondary | ICD-10-CM

## 2021-03-15 MED ORDER — LEVOTHYROXINE SODIUM 75 MCG PO TABS: ORAL_TABLET | ORAL | 0 refills | Status: DC

## 2021-03-15 NOTE — Telephone Encounter (Signed)
Rx refilled Heidi [name] aware of update.

## 2021-03-15 NOTE — Telephone Encounter (Signed)
Heidi [name] call and stated pt need a refill on her medication and pt have a appt set up for 03/17/21.

## 2021-03-17 ENCOUNTER — Ambulatory Visit (INDEPENDENT_AMBULATORY_CARE_PROVIDER_SITE_OTHER): Payer: Medicare Other | Admitting: Adult Health

## 2021-03-17 ENCOUNTER — Other Ambulatory Visit: Payer: Self-pay | Admitting: Adult Health

## 2021-03-17 ENCOUNTER — Encounter: Payer: Self-pay | Admitting: Adult Health

## 2021-03-17 ENCOUNTER — Other Ambulatory Visit: Payer: Self-pay

## 2021-03-17 VITALS — BP 120/70 | HR 77 | Temp 97.9°F | Ht 60.0 in | Wt 100.0 lb

## 2021-03-17 DIAGNOSIS — E43 Unspecified severe protein-calorie malnutrition: Secondary | ICD-10-CM | POA: Diagnosis not present

## 2021-03-17 DIAGNOSIS — G4700 Insomnia, unspecified: Secondary | ICD-10-CM

## 2021-03-17 DIAGNOSIS — E039 Hypothyroidism, unspecified: Secondary | ICD-10-CM

## 2021-03-17 DIAGNOSIS — M810 Age-related osteoporosis without current pathological fracture: Secondary | ICD-10-CM | POA: Diagnosis not present

## 2021-03-17 DIAGNOSIS — J449 Chronic obstructive pulmonary disease, unspecified: Secondary | ICD-10-CM | POA: Diagnosis not present

## 2021-03-17 DIAGNOSIS — F39 Unspecified mood [affective] disorder: Secondary | ICD-10-CM

## 2021-03-17 DIAGNOSIS — Z23 Encounter for immunization: Secondary | ICD-10-CM

## 2021-03-17 DIAGNOSIS — E785 Hyperlipidemia, unspecified: Secondary | ICD-10-CM

## 2021-03-17 LAB — CBC WITH DIFFERENTIAL/PLATELET
Basophils Absolute: 0 10*3/uL (ref 0.0–0.1)
Basophils Relative: 0.4 % (ref 0.0–3.0)
Eosinophils Absolute: 0.1 10*3/uL (ref 0.0–0.7)
Eosinophils Relative: 1.2 % (ref 0.0–5.0)
HCT: 33.6 % — ABNORMAL LOW (ref 36.0–46.0)
Hemoglobin: 10.7 g/dL — ABNORMAL LOW (ref 12.0–15.0)
Lymphocytes Relative: 20.7 % (ref 12.0–46.0)
Lymphs Abs: 1.4 10*3/uL (ref 0.7–4.0)
MCHC: 31.9 g/dL (ref 30.0–36.0)
MCV: 88.6 fl (ref 78.0–100.0)
Monocytes Absolute: 0.5 10*3/uL (ref 0.1–1.0)
Monocytes Relative: 7.5 % (ref 3.0–12.0)
Neutro Abs: 4.6 10*3/uL (ref 1.4–7.7)
Neutrophils Relative %: 70.2 % (ref 43.0–77.0)
Platelets: 216 10*3/uL (ref 150.0–400.0)
RBC: 3.79 Mil/uL — ABNORMAL LOW (ref 3.87–5.11)
RDW: 15.7 % — ABNORMAL HIGH (ref 11.5–15.5)
WBC: 6.6 10*3/uL (ref 4.0–10.5)

## 2021-03-17 LAB — COMPREHENSIVE METABOLIC PANEL
ALT: 9 U/L (ref 0–35)
AST: 16 U/L (ref 0–37)
Albumin: 3.8 g/dL (ref 3.5–5.2)
Alkaline Phosphatase: 34 U/L — ABNORMAL LOW (ref 39–117)
BUN: 14 mg/dL (ref 6–23)
CO2: 37 mEq/L — ABNORMAL HIGH (ref 19–32)
Calcium: 9 mg/dL (ref 8.4–10.5)
Chloride: 100 mEq/L (ref 96–112)
Creatinine, Ser: 0.59 mg/dL (ref 0.40–1.20)
GFR: 85.65 mL/min (ref >60.00)
Glucose, Bld: 97 mg/dL (ref 70–99)
Potassium: 4.1 mEq/L (ref 3.5–5.1)
Sodium: 142 mEq/L (ref 135–145)
Total Bilirubin: 0.3 mg/dL (ref 0.2–1.2)
Total Protein: 5.9 g/dL — ABNORMAL LOW (ref 6.0–8.3)

## 2021-03-17 LAB — VITAMIN D 25 HYDROXY (VIT D DEFICIENCY, FRACTURES): VITD: 18.74 ng/mL — ABNORMAL LOW (ref 30.00–100.00)

## 2021-03-17 LAB — LIPID PANEL
Cholesterol: 188 mg/dL (ref 0–200)
HDL: 89.9 mg/dL (ref >39.00)
LDL Cholesterol: 87 mg/dL (ref 0–99)
NonHDL: 97.9
Total CHOL/HDL Ratio: 2
Triglycerides: 55 mg/dL (ref 0.0–149.0)
VLDL: 11 mg/dL (ref 0.0–40.0)

## 2021-03-17 LAB — TSH: TSH: 1.76 u[IU]/mL (ref 0.35–5.50)

## 2021-03-17 MED ORDER — METHYLPREDNISOLONE 4 MG PO TABS: ORAL_TABLET | ORAL | 3 refills | Status: DC

## 2021-03-17 MED ORDER — ESCITALOPRAM OXALATE 10 MG PO TABS: ORAL_TABLET | ORAL | 1 refills | Status: DC

## 2021-03-17 MED ORDER — GABAPENTIN 300 MG PO CAPS: 300.0000 mg | ORAL_CAPSULE | Freq: Two times a day (BID) | ORAL | 1 refills | Status: DC

## 2021-03-17 MED ORDER — ALENDRONATE SODIUM 70 MG PO TABS: ORAL_TABLET | ORAL | 2 refills | Status: DC

## 2021-03-17 MED ORDER — TEMAZEPAM 15 MG PO CAPS: ORAL_CAPSULE | ORAL | 2 refills | Status: DC

## 2021-03-17 MED ORDER — LEVOTHYROXINE SODIUM 75 MCG PO TABS: ORAL_TABLET | ORAL | 3 refills | Status: DC

## 2021-03-17 MED ORDER — SIMVASTATIN 20 MG PO TABS: ORAL_TABLET | ORAL | 3 refills | Status: DC

## 2021-03-17 NOTE — Patient Instructions (Signed)
It was great seeing you today   We will follow up with you regarding your blood work   You received your flu shot today   Please see me in one year or sooner if needed
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
## 2021-04-19 ENCOUNTER — Telehealth: Payer: Self-pay

## 2021-04-19 ENCOUNTER — Ambulatory Visit: Payer: Medicare Other

## 2021-04-19 NOTE — Telephone Encounter (Signed)
Called patient x 3 no answer, patient may reschedule next available appointment.  L.Areebah Meinders,LPN

## 2021-05-13 ENCOUNTER — Ambulatory Visit (INDEPENDENT_AMBULATORY_CARE_PROVIDER_SITE_OTHER): Payer: Medicare Other

## 2021-05-13 DIAGNOSIS — Z0100 Encounter for examination of eyes and vision without abnormal findings: Secondary | ICD-10-CM

## 2021-05-13 DIAGNOSIS — Z0000 Encounter for general adult medical examination without abnormal findings: Secondary | ICD-10-CM

## 2021-05-13 NOTE — Patient Instructions (Signed)
Heidi [name] , Thank you for taking time to come for your Medicare Wellness Visit. I appreciate your ongoing commitment to your health goals. Please review the following plan we discussed and let me know if I can assist you in the future.   Screening recommendations/referrals: Colonoscopy: no longer required  Mammogram: no longer required  Bone Density: 02/09/2015 Recommended yearly ophthalmology/optometry visit for glaucoma screening and checkup Recommended yearly dental visit for hygiene and checkup  Vaccinations: Influenza vaccine: completed  Pneumococcal vaccine: completed  Tdap vaccine: 04/16/2013 Shingles vaccine: declined    Advanced directives: none   Conditions/risks identified: none   Next appointment: none    Preventive Care 78 Years and Older, Female Preventive care refers to lifestyle choices and visits with your health care provider that can promote health and wellness. What does preventive care include? A yearly physical exam. This is also called an annual well check. Dental exams once or twice a year. Routine eye exams. Ask your health care provider how often you should have your eyes checked. Personal lifestyle choices, including: Daily care of your teeth and gums. Regular physical activity. Eating a healthy diet. Avoiding tobacco and drug use. Limiting alcohol use. Practicing safe sex. Taking low-dose aspirin every day. Taking vitamin and mineral supplements as recommended by your health care provider. What happens during an annual well check? The services and screenings done by your health care provider during your annual well check will depend on your age, overall health, lifestyle risk factors, and family history of disease. Counseling  Your health care provider may ask you questions about your: Alcohol use. Tobacco use. Drug use. Emotional well-being. Home and relationship well-being. Sexual activity. Eating habits. History of falls. Memory and  ability to understand (cognition). Work and work Statistician. Reproductive health. Screening  You may have the following tests or measurements: Height, weight, and BMI. Blood pressure. Lipid and cholesterol levels. These may be checked every 5 years, or more frequently if you are over 29 years old. Skin check. Lung cancer screening. You may have this screening every year starting at age 26 if you have a 30-pack-year history of smoking and currently smoke or have quit within the past 15 years. Fecal occult blood test (FOBT) of the stool. You may have this test every year starting at age 45. Flexible sigmoidoscopy or colonoscopy. You may have a sigmoidoscopy every 5 years or a colonoscopy every 10 years starting at age 18. Hepatitis C blood test. Hepatitis B blood test. Sexually transmitted disease (STD) testing. Diabetes screening. This is done by checking your blood sugar (glucose) after you have not eaten for a while (fasting). You may have this done every 1-3 years. Bone density scan. This is done to screen for osteoporosis. You may have this done starting at age 9. Mammogram. This may be done every 1-2 years. Talk to your health care provider about how often you should have regular mammograms. Talk with your health care provider about your test results, treatment options, and if necessary, the need for more tests. Vaccines  Your health care provider may recommend certain vaccines, such as: Influenza vaccine. This is recommended every year. Tetanus, diphtheria, and acellular pertussis (Tdap, Td) vaccine. You may need a Td booster every 10 years. Zoster vaccine. You may need this after age 55. Pneumococcal 13-valent conjugate (PCV13) vaccine. One dose is recommended after age 8. Pneumococcal polysaccharide (PPSV23) vaccine. One dose is recommended after age 97. Talk to your health care provider about which screenings and vaccines you  need and how often you need them. This information is  not intended to replace advice given to you by your health care provider. Make sure you discuss any questions you have with your health care provider. Document Released: 07/30/2015 Document Revised: 03/22/2016 Document Reviewed: 05/04/2015 Elsevier Interactive Patient Education  2017 [employer] Prevention in the Home Falls can cause injuries. They can happen to people of all ages. There are many things you can do to make your home safe and to help prevent falls. What can I do on the outside of my home? Regularly fix the edges of walkways and driveways and fix any cracks. Remove anything that might make you trip as you walk through a door, such as a raised step or threshold. Trim any bushes or trees on the path to your home. Use bright outdoor lighting. Clear any walking paths of anything that might make someone trip, such as rocks or tools. Regularly check to see if handrails are loose or broken. Make sure that both sides of any steps have handrails. Any raised decks and porches should have guardrails on the edges. Have any leaves, snow, or ice cleared regularly. Use sand or salt on walking paths during winter. Clean up any spills in your garage right away. This includes oil or grease spills. What can I do in the bathroom? Use night lights. Install grab bars by the toilet and in the tub and shower. Do not use towel bars as grab bars. Use non-skid mats or decals in the tub or shower. If you need to sit down in the shower, use a plastic, non-slip stool. Keep the floor dry. Clean up any water that spills on the floor as soon as it happens. Remove soap buildup in the tub or shower regularly. Attach bath mats securely with double-sided non-slip rug tape. Do not have throw rugs and other things on the floor that can make you trip. What can I do in the bedroom? Use night lights. Make sure that you have a light by your bed that is easy to reach. Do not use any sheets or blankets that  are too big for your bed. They should not hang down onto the floor. Have a firm chair that has side arms. You can use this for support while you get dressed. Do not have throw rugs and other things on the floor that can make you trip. What can I do in the kitchen? Clean up any spills right away. Avoid walking on wet floors. Keep items that you use a lot in easy-to-reach places. If you need to reach something above you, use a strong step stool that has a grab bar. Keep electrical cords out of the way. Do not use floor polish or wax that makes floors slippery. If you must use wax, use non-skid floor wax. Do not have throw rugs and other things on the floor that can make you trip. What can I do with my stairs? Do not leave any items on the stairs. Make sure that there are handrails on both sides of the stairs and use them. Fix handrails that are broken or loose. Make sure that handrails are as long as the stairways. Check any carpeting to make sure that it is firmly attached to the stairs. Fix any carpet that is loose or worn. Avoid having throw rugs at the top or bottom of the stairs. If you do have throw rugs, attach them to the floor with carpet tape. Make sure that  you have a light switch at the top of the stairs and the bottom of the stairs. If you do not have them, ask someone to add them for you. What else can I do to help prevent falls? Wear shoes that: Do not have high heels. Have rubber bottoms. Are comfortable and fit you well. Are closed at the toe. Do not wear sandals. If you use a stepladder: Make sure that it is fully opened. Do not climb a closed stepladder. Make sure that both sides of the stepladder are locked into place. Ask someone to hold it for you, if possible. Clearly mark and make sure that you can see: Any grab bars or handrails. First and last steps. Where the edge of each step is. Use tools that help you move around (mobility aids) if they are needed. These  include: Canes. Walkers. Scooters. Crutches. Turn on the lights when you go into a dark area. Replace any light bulbs as soon as they burn out. Set up your furniture so you have a clear path. Avoid moving your furniture around. If any of your floors are uneven, fix them. If there are any pets around you, be aware of where they are. Review your medicines with your doctor. Some medicines can make you feel dizzy. This can increase your chance of falling. Ask your doctor what other things that you can do to help prevent falls. This information is not intended to replace advice given to you by your health care provider. Make sure you discuss any questions you have with your health care provider. Document Released: 04/29/2009 Document Revised: 12/09/2015 Document Reviewed: 08/07/2014 Elsevier Interactive Patient Education  2017 [employer].

## 2021-05-13 NOTE — Progress Notes (Signed)
Subjective:   Heidi [name] is a 80 y.o. female who presents for Medicare Annual (Subsequent) preventive examination.  I connected with [name] today by telephone and verified that I am speaking with the correct person using two identifiers. Location patient: home Location provider: work Persons participating in the virtual visit: patient, provider.   I discussed the limitations, risks, security and privacy concerns of performing an evaluation and management service by telephone and the availability of in person appointments. I also discussed with the patient that there may be a patient responsible charge related to this service. The patient expressed understanding and verbally consented to this telephonic visit.    Interactive audio and video telecommunications were attempted between this provider and patient, however failed, due to patient having technical difficulties OR patient did not have access to video capability.  We continued and completed visit with audio only.    Review of Systems     Cardiac Risk Factors include: advanced age (>61men, >55 women);dyslipidemia;hypertension     Objective:    Today's Vitals   There is no height or weight on file to calculate BMI.  Advanced Directives 05/13/2021 04/13/2017 07/26/2016 04/19/2015 06/22/2011  Does Patient Have a Medical Advance Directive? No Yes Yes No Patient does not have advance directive  Type of Advance Directive - [place];Living will [place];Living will - -  Does patient want to make changes to medical advance directive? - No - Patient declined No - Patient declined - -  Copy of [place] in Chart? - No - copy requested - - -  Would patient like information on creating a medical advance directive? No - Patient declined - - No - patient declined information -    Current Medications (verified) Outpatient Encounter Medications as of 05/13/2021  Medication Sig    albuterol (VENTOLIN HFA) 108 (90 Base) MCG/ACT inhaler Inhale 2 puffs into the lungs every 6 (six) hours as needed for wheezing or shortness of breath.   alendronate (FOSAMAX) 70 MG tablet Take with a full glass of water on an empty stomach.TAKE 1 TABLET BY MOUTH ONCE A WEEK IN THE MORNING WITH A GLASS OF WATER. DO NOT EAT OR LIE DOWN FOR 30 MINUTES.   escitalopram (LEXAPRO) 10 MG tablet TAKE ONE (1) TABLET ONCE DAILY   Fluticasone-Umeclidin-Vilant (TRELEGY ELLIPTA) 100-62.5-25 MCG/INH AEPB Inhale 1 puff into the lungs daily.   gabapentin (NEURONTIN) 300 MG capsule Take 1 capsule (300 mg total) by mouth 2 (two) times daily.   levothyroxine (SYNTHROID) 75 MCG tablet TAKE ONE (1) TABLET ONCE DAILY BEFORE BREAKFAST   naproxen sodium (ALEVE) 220 MG tablet Take 220 mg by mouth daily as needed.   pseudoephedrine-guaifenesin (MUCINEX D) 60-600 MG 12 hr tablet Take 1 tablet by mouth 2 (two) times daily as needed for congestion.   simvastatin (ZOCOR) 20 MG tablet TAKE ONE TABLET BY MOUTH AT BEDTIME   temazepam (RESTORIL) 15 MG capsule TAKE 1 CAPSULE BY MOUTH AT BEDTIME AS NEEDED FOR SLEEP   ipratropium-albuterol (DUONEB) 0.5-2.5 (3) MG/3ML SOLN Take 3 mLs by nebulization 3 (three) times daily. (Patient not taking: Reported on 05/13/2021)   methylPREDNISolone (MEDROL) 4 MG tablet Take one tablet by mouth every day (Patient not taking: Reported on 05/13/2021)   No facility-administered encounter medications on file as of 05/13/2021.    Allergies (verified) Morphine and related and Penicillins   History: Past Medical History:  Diagnosis Date   Arthritis    Cerebrovascular  disease    Chronic diastolic CHF (congestive heart failure) ([place]) 02/23/2016   COPD (chronic obstructive pulmonary disease) ([facility])    Depression    Diverticulosis    Esophageal stricture    Generalized anxiety disorder    History of pneumonia    History of stroke    mild oncoming   Hx of adenomatous colonic polyps    with high  grade dysplasia   Hyperlipidemia    Hypothyroidism    Internal hemorrhoids    Osteoporosis    Pneumonia    HX of   Positive ANA (antinuclear antibody)    Pulmonary nodule    Rectal ulcer    Shortness of breath    periodically with COPD flair up   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([place])    Past Surgical History:  Procedure Laterality Date   BLADDER SURGERY     tack   CATARACT EXTRACTION     bilateral   COLONOSCOPY  06/22/2011   Procedure: COLONOSCOPY;  Surgeon: Lafayette Dragon, MD;  Location: [facility];  Service: Endoscopy;  Laterality: N/A;   GASTRIC VARICES BANDING  06/22/2011   Procedure: HEMORRHOID BANDING;  Surgeon: Lafayette Dragon, MD;  Location: [facility];  Service: Endoscopy;  Laterality: N/A;   HEMICOLECTOMY  2012   emergent surgery [facility]   HIATAL HERNIA REPAIR     x 2   TONSILLECTOMY     TOTAL ABDOMINAL HYSTERECTOMY W/ BILATERAL SALPINGOOPHORECTOMY  1998   Family History  Problem Relation Age of Onset   Colon cancer Mother    COPD Father    Heart attack Father    Stroke Maternal Aunt    Colon polyps Brother    Liver disease Maternal Aunt    Breast cancer Maternal Aunt    Hypertension Son    Hypothyroidism Daughter    Social History   Socioeconomic History   Marital status: Widowed    Spouse name: Not on file   Number of children: 3   Years of education: Not on file   Highest education level: Not on file  Occupational History   Occupation: RETIRED    Employer: RETIRED  Tobacco Use   Smoking status: Former    Packs/day: 2.50    Years: 50.00    Pack years: 125.00    Types: Cigarettes    Quit date: 11/01/2009    Years since quitting: 11.5   Smokeless tobacco: Never  Vaping Use   Vaping Use: Never used  Substance and Sexual Activity   Alcohol use: No   Drug use: No   Sexual activity: Not Currently  Other Topics Concern   Not on file  Social History Narrative   Patient has no recent travel. She is smoking 2 packs per day and has smoked since age of  27. Has cats and dogs at home. No bird or mold exposure.       dtr with temporary    Social Determinants of Health   Financial Resource Strain: Low Risk    Difficulty of Paying Living Expenses: Not hard at all  Food Insecurity: No Food Insecurity   Worried About Charity fundraiser in the Last Year: Never true   Ran Out of Food in the Last Year: Never true  Transportation Needs: No Transportation Needs   Lack of Transportation (Medical): No   Lack of Transportation (Non-Medical): No  Physical Activity: Inactive   Days of Exercise per Week: 0 days   Minutes of Exercise per Session: 0 min  Stress: No Stress Concern Present   Feeling of Stress : Not at all  Social Connections: Moderately Isolated   Frequency of Communication with Friends and Family: Twice a week   Frequency of Social Gatherings with Friends and Family: Twice a week   Attends Religious Services: 1 to 4 times per year   Active Member of [employer] or Organizations: No   Attends Archivist Meetings: Never   Marital Status: Widowed    Tobacco Counseling Counseling given: Not Answered   Clinical Intake:  Pre-visit preparation completed: Yes  Pain : No/denies pain     Nutritional Risks: None Diabetes: No  How often do you need to have someone help you when you read instructions, pamphlets, or other written materials from your doctor or pharmacy?: 1 - Never What is the last grade level you completed in school?: GED  Diabetic?no  Interpreter Needed?: No  Information entered by :: L.Conroy Goracke,LPN   Activities of Daily Living In your present state of health, do you have any difficulty performing the following activities: 05/13/2021  Hearing? N  Vision? N  Difficulty concentrating or making decisions? N  Walking or climbing stairs? N  Dressing or bathing? N  Doing errands, shopping? N  Preparing Food and eating ? N  Using the Toilet? N  In the past six months, have you accidently leaked urine? N  Do  you have problems with loss of bowel control? N  Managing your Medications? N  Managing your Finances? N  Housekeeping or managing your Housekeeping? N  Some recent data might be hidden    Patient Care Team: Dorothyann Peng, NP as PCP - General (Family Medicine)  Indicate any recent Medical Services you may have received from other than [facility] providers in the past year (date may be approximate).     Assessment:   This is a routine wellness examination for Heidi [name].  Hearing/Vision screen Vision Screening - Comments:: Referral completed 05/13/2021  Dietary issues and exercise activities discussed: Current Exercise Habits: The patient does not participate in regular exercise at present   Goals Addressed             This Visit's Progress    Exercise 150 minutes per week (moderate activity)   Not on track    Exercise routinely; getting up and down from chair multiple times an hour Marching in place when not cleaning the home Cans of soup to exercise your arms        Depression Screen PHQ 2/9 Scores 05/13/2021 05/13/2021 03/17/2021 12/06/2017 04/13/2017 07/26/2016 02/10/2016  PHQ - 2 Score 0 0 1 2 3  0 0  PHQ- 9 Score - - 5 6 7  - -    Fall Risk Fall Risk  05/13/2021 03/17/2021 02/13/2019 12/06/2017 04/13/2017  Falls in the past year? 0 0 0 No Yes  Comment - - Emmi Telephone Survey: data to providers prior to load - -  Number falls in past yr: 0 0 - - 1  Injury with Fall? 0 0 - - Yes  Risk Factor Category  - - - - -  Risk for fall due to : No Fall Risks - - Impaired balance/gait;Impaired mobility Impaired balance/gait;Impaired mobility  Follow up Falls evaluation completed - - - Falls evaluation completed;Education provided;Falls prevention discussed    FALL RISK PREVENTION PERTAINING TO THE HOME:  Any stairs in or around the home? No  If so, are there any without handrails? No  Home free of loose throw rugs in walkways, pet  beds, electrical cords, etc? Yes  Adequate lighting in  your home to reduce risk of falls? Yes   ASSISTIVE DEVICES UTILIZED TO PREVENT FALLS:  Life alert? No  Use of a cane, walker or w/c? Yes  Grab bars in the bathroom? Yes  Shower chair or bench in shower? Yes  Elevated toilet seat or a handicapped toilet? Yes    Cognitive Function: Normal cognitive status assessed by direct observation by this Nurse Health Advisor. No abnormalities found.   MMSE - Mini Mental State Exam 07/26/2016  Not completed: (No Data)        Immunizations Immunization History  Administered Date(s) Administered   Fluad Quad(high Dose 65+) 04/22/2019, 06/09/2020, 03/17/2021   H1N1 06/30/2008   Influenza Split 06/10/2007, 04/14/2010, 04/18/2012   Influenza Whole 06/10/2007, 04/14/2010, 04/18/2012   Influenza, High Dose Seasonal PF 06/02/2015, 04/10/2016, 04/11/2017, 04/25/2018   Influenza,inj,Quad PF,6+ Mos 04/16/2013   [place] SARS COV-2 Pediatric Vaccination 80mos to <30yrs 05/17/2020   Moderna SARS-COV2 Booster Vaccination 10/16/2019, 06/29/2020   Pneumococcal Conjugate-13 06/02/2015   Pneumococcal Polysaccharide-23 05/17/2006, 04/16/2013   Td 03/29/1999   Tdap 03/29/1999, 04/16/2013   Zoster, Live 07/17/2013    TDAP status: Up to date  Flu Vaccine status: Up to date  Pneumococcal vaccine status: Up to date  Covid-19 vaccine status: Completed vaccines  Qualifies for Shingles Vaccine? Yes   Zostavax completed No   Shingrix Completed?: No.    Education has been provided regarding the importance of this vaccine. Patient has been advised to call insurance company to determine out of pocket expense if they have not yet received this vaccine. Advised may also receive vaccine at local pharmacy or Health Dept. Verbalized acceptance and understanding.  Screening Tests Health Maintenance  Topic Date Due   COVID-19 Vaccine (1) 09/14/2021 (Originally 03/05/1941)   Zoster Vaccines- Shingrix (1 of 2) 09/14/2021 (Originally 09/06/1991)   TETANUS/TDAP  04/17/2023    Pneumonia Vaccine 9+ Years old  Completed   INFLUENZA VACCINE  Completed   DEXA SCAN  Completed   Hepatitis C Screening  Completed   HPV VACCINES  Aged Out    Health Maintenance  There are no preventive care reminders to display for this patient.  Colorectal cancer screening: No longer required.   Mammogram status: No longer required due to age.  Bone Density status: Completed 02/09/2015. Results reflect: Bone density results: OSTEOPENIA. Repeat every 5 years.  Lung Cancer Screening: (Low Dose CT Chest recommended if Age 6-80 years, 30 pack-year currently smoking OR have quit w/in 15years.) does not qualify.   Lung Cancer Screening Referral: n/a  Additional Screening:  Hepatitis C Screening: does not qualify; Completed n/a  Vision Screening: Recommended annual ophthalmology exams for early detection of glaucoma and other disorders of the eye. Is the patient up to date with their annual eye exam?  No  Who is the provider or what is the name of the office in which the patient attends annual eye exams? Referral completed 05/13/2021 If pt is not established with a provider, would they like to be referred to a provider to establish care? No .   Dental Screening: Recommended annual dental exams for proper oral hygiene  Community Resource Referral / Chronic Care Management: CRR required this visit?  No   CCM required this visit?  No      Plan:     I have personally reviewed and noted the following in the patient's chart:   Medical and social history Use of alcohol, tobacco or  illicit drugs  Current medications and supplements including opioid prescriptions.  Functional ability and status Nutritional status Physical activity Advanced directives List of other physicians Hospitalizations, surgeries, and ER visits in previous 12 months Vitals Screenings to include cognitive, depression, and falls Referrals and appointments  In addition, I have reviewed and discussed  with patient certain preventive protocols, quality metrics, and best practice recommendations. A written personalized care plan for preventive services as well as general preventive health recommendations were provided to patient.     Randel Pigg, LPN   07/62/2633   Nurse Notes: none

## 2021-06-13 ENCOUNTER — Other Ambulatory Visit: Payer: Self-pay | Admitting: Adult Health

## 2021-06-13 DIAGNOSIS — G4700 Insomnia, unspecified: Secondary | ICD-10-CM

## 2021-07-27 ENCOUNTER — Other Ambulatory Visit: Payer: Self-pay | Admitting: Adult Health

## 2021-07-27 DIAGNOSIS — F39 Unspecified mood [affective] disorder: Secondary | ICD-10-CM

## 2021-08-31 ENCOUNTER — Other Ambulatory Visit: Payer: Self-pay | Admitting: Adult Health

## 2021-08-31 DIAGNOSIS — G4700 Insomnia, unspecified: Secondary | ICD-10-CM

## 2021-09-22 ENCOUNTER — Other Ambulatory Visit: Payer: Self-pay | Admitting: Adult Health

## 2021-09-22 DIAGNOSIS — G4700 Insomnia, unspecified: Secondary | ICD-10-CM

## 2021-09-22 DIAGNOSIS — M810 Age-related osteoporosis without current pathological fracture: Secondary | ICD-10-CM

## 2021-12-16 ENCOUNTER — Other Ambulatory Visit: Payer: Self-pay | Admitting: Adult Health

## 2021-12-16 DIAGNOSIS — F39 Unspecified mood [affective] disorder: Secondary | ICD-10-CM

## 2021-12-17 DIAGNOSIS — Z90710 Acquired absence of both cervix and uterus: Secondary | ICD-10-CM | POA: Diagnosis not present

## 2021-12-17 DIAGNOSIS — S300XXA Contusion of lower back and pelvis, initial encounter: Secondary | ICD-10-CM | POA: Diagnosis not present

## 2021-12-17 DIAGNOSIS — M25562 Pain in left knee: Secondary | ICD-10-CM | POA: Diagnosis not present

## 2021-12-17 DIAGNOSIS — K5730 Diverticulosis of large intestine without perforation or abscess without bleeding: Secondary | ICD-10-CM | POA: Diagnosis not present

## 2021-12-17 DIAGNOSIS — S40022A Contusion of left upper arm, initial encounter: Secondary | ICD-10-CM | POA: Diagnosis not present

## 2021-12-17 DIAGNOSIS — R102 Pelvic and perineal pain: Secondary | ICD-10-CM | POA: Diagnosis not present

## 2021-12-17 DIAGNOSIS — I700 Atherosclerosis of aorta: Secondary | ICD-10-CM | POA: Diagnosis not present

## 2021-12-17 DIAGNOSIS — S40012A Contusion of left shoulder, initial encounter: Secondary | ICD-10-CM | POA: Diagnosis not present

## 2021-12-17 DIAGNOSIS — M160 Bilateral primary osteoarthritis of hip: Secondary | ICD-10-CM | POA: Diagnosis not present

## 2021-12-17 DIAGNOSIS — M25512 Pain in left shoulder: Secondary | ICD-10-CM | POA: Diagnosis not present

## 2021-12-29 ENCOUNTER — Other Ambulatory Visit: Payer: Self-pay | Admitting: Adult Health

## 2022-01-11 ENCOUNTER — Other Ambulatory Visit: Payer: Self-pay | Admitting: Adult Health

## 2022-01-11 DIAGNOSIS — E039 Hypothyroidism, unspecified: Secondary | ICD-10-CM

## 2022-01-11 DIAGNOSIS — G4700 Insomnia, unspecified: Secondary | ICD-10-CM

## 2022-02-14 ENCOUNTER — Ambulatory Visit (INDEPENDENT_AMBULATORY_CARE_PROVIDER_SITE_OTHER): Payer: Medicare Other

## 2022-02-14 ENCOUNTER — Encounter: Payer: Self-pay | Admitting: Adult Health

## 2022-02-14 ENCOUNTER — Ambulatory Visit (INDEPENDENT_AMBULATORY_CARE_PROVIDER_SITE_OTHER): Payer: Medicare Other | Admitting: Adult Health

## 2022-02-14 VITALS — BP 110/78 | HR 84 | Temp 97.4°F | Ht 60.0 in | Wt 104.0 lb

## 2022-02-14 DIAGNOSIS — R109 Unspecified abdominal pain: Secondary | ICD-10-CM | POA: Diagnosis not present

## 2022-02-14 DIAGNOSIS — R630 Anorexia: Secondary | ICD-10-CM | POA: Diagnosis not present

## 2022-02-14 DIAGNOSIS — M533 Sacrococcygeal disorders, not elsewhere classified: Secondary | ICD-10-CM

## 2022-02-14 LAB — CBC WITH DIFFERENTIAL/PLATELET
Basophils Absolute: 0 10*3/uL (ref 0.0–0.1)
Basophils Relative: 0.4 % (ref 0.0–3.0)
Eosinophils Absolute: 0 10*3/uL (ref 0.0–0.7)
Eosinophils Relative: 0.3 % (ref 0.0–5.0)
HCT: 30.8 % — ABNORMAL LOW (ref 36.0–46.0)
Hemoglobin: 9.8 g/dL — ABNORMAL LOW (ref 12.0–15.0)
Lymphocytes Relative: 15.7 % (ref 12.0–46.0)
Lymphs Abs: 1.1 10*3/uL (ref 0.7–4.0)
MCHC: 31.9 g/dL (ref 30.0–36.0)
MCV: 84 fl (ref 78.0–100.0)
Monocytes Absolute: 0.2 10*3/uL (ref 0.1–1.0)
Monocytes Relative: 2.8 % — ABNORMAL LOW (ref 3.0–12.0)
Neutro Abs: 5.7 10*3/uL (ref 1.4–7.7)
Neutrophils Relative %: 80.8 % — ABNORMAL HIGH (ref 43.0–77.0)
Platelets: 308 10*3/uL (ref 150.0–400.0)
RBC: 3.67 Mil/uL — ABNORMAL LOW (ref 3.87–5.11)
RDW: 17 % — ABNORMAL HIGH (ref 11.5–15.5)
WBC: 7 10*3/uL (ref 4.0–10.5)

## 2022-02-14 LAB — BASIC METABOLIC PANEL
BUN: 11 mg/dL (ref 6–23)
CO2: 30 mEq/L (ref 19–32)
Calcium: 9.8 mg/dL (ref 8.4–10.5)
Chloride: 96 mEq/L (ref 96–112)
Creatinine, Ser: 0.75 mg/dL (ref 0.40–1.20)
GFR: 75.18 mL/min (ref >60.00)
Glucose, Bld: 91 mg/dL (ref 70–99)
Potassium: 4.2 mEq/L (ref 3.5–5.1)
Sodium: 140 mEq/L (ref 135–145)

## 2022-02-14 MED ORDER — OMEPRAZOLE 20 MG PO CPDR: 20.0000 mg | DELAYED_RELEASE_CAPSULE | Freq: Every day | ORAL | 0 refills | Status: DC

## 2022-02-14 NOTE — Progress Notes (Signed)
Subjective:    Patient ID: Heidi [name], female    DOB: [birth year], 81 y.o.   MRN: [id number]  HPI 81 year old female who  has a past medical history of Arthritis, Cerebrovascular disease, Chronic diastolic CHF (congestive heart failure) ([place]) (02/23/2016), COPD (chronic obstructive pulmonary disease) ([place]), Depression, Diverticulosis, Esophageal stricture, Generalized anxiety disorder, History of pneumonia, History of stroke, adenomatous colonic polyps, Hyperlipidemia, Hypothyroidism, Internal hemorrhoids, Osteoporosis, Pneumonia, Positive ANA (antinuclear antibody), Pulmonary nodule, Rectal ulcer, Shortness of breath, and 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([place]).  She presents to the office today with the complaint of nausea/decreased appetite and low back pain   She is with her daughter in law today   3 weeks ago she had a fall after rolling out of bed while at home.  Her family member took her to the emergency room and she had x-rays and an MRI done which came back normal.  This time she was complaining of low back pain and left arm pain.  But a week later she fell from standing while at home.  Family did not take her to the emergency room this time.  She complains of sacral pain as well as lumbar spine pain.  At home she has been taking Advil more frequently than the recommended daily dose.  He reports no improvement in her symptoms.  She is able to walk but has pain with doing so.  Also pain with sitting.  Over the last 2 weeks she has had some abdominal discomfort, nausea,loose stools and loss of appetite.  No blood in stool vomiting.   Review of Systems See HPI   Past Medical History:  Diagnosis Date   Arthritis    Cerebrovascular disease    Chronic diastolic CHF (congestive heart failure) ([place]) 02/23/2016   COPD (chronic obstructive pulmonary disease) ([facility])    Depression    Diverticulosis    Esophageal stricture    Generalized anxiety disorder    History of pneumonia    History of stroke     mild oncoming   Hx of adenomatous colonic polyps    with high grade dysplasia   Hyperlipidemia    Hypothyroidism    Internal hemorrhoids    Osteoporosis    Pneumonia    HX of   Positive ANA (antinuclear antibody)    Pulmonary nodule    Rectal ulcer    Shortness of breath    periodically with COPD flair up   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([place])     Social History   Socioeconomic History   Marital status: Widowed    Spouse name: Not on file   Number of children: 3   Years of education: Not on file   Highest education level: Not on file  Occupational History   Occupation: RETIRED    Employer: RETIRED  Tobacco Use   Smoking status: Former    Packs/day: 2.50    Years: 50.00    Total pack years: 125.00    Types: Cigarettes    Quit date: 11/01/2009    Years since quitting: 12.2   Smokeless tobacco: Never  Vaping Use   Vaping Use: Never used  Substance and Sexual Activity   Alcohol use: No   Drug use: No   Sexual activity: Not Currently  Other Topics Concern   Not on file  Social History Narrative   Patient has no recent travel. She is smoking 2 packs per day and has smoked since age of 54. Has cats and dogs at home. No bird  or mold exposure.       dtr with temporary    Social Determinants of Health   Financial Resource Strain: Low Risk  (05/13/2021)   Overall Financial Resource Strain (CARDIA)    Difficulty of Paying Living Expenses: Not hard at all  Food Insecurity: No Food Insecurity (05/13/2021)   Hunger Vital Sign    Worried About Running Out of Food in the Last Year: Never true    Ran Out of Food in the Last Year: Never true  Transportation Needs: No Transportation Needs (05/13/2021)   PRAPARE - Hydrologist (Medical): No    Lack of Transportation (Non-Medical): No  Physical Activity: Inactive (05/13/2021)   Exercise Vital Sign    Days of Exercise per Week: 0 days    Minutes of Exercise per Session: 0 min  Stress: No Stress Concern Present  (05/13/2021)   [place]    Feeling of Stress : Not at all  Social Connections: Moderately Isolated (05/13/2021)   Social Connection and Isolation Panel [NHANES]    Frequency of Communication with Friends and Family: Twice a week    Frequency of Social Gatherings with Friends and Family: Twice a week    Attends Religious Services: 1 to 4 times per year    Active Member of [employer] or Organizations: No    Attends Archivist Meetings: Never    Marital Status: Widowed  Intimate Partner Violence: Not At Risk (05/13/2021)   Humiliation, Afraid, Rape, and Kick questionnaire    Fear of Current or Ex-Partner: No    Emotionally Abused: No    Physically Abused: No    Sexually Abused: No    Past Surgical History:  Procedure Laterality Date   BLADDER SURGERY     tack   CATARACT EXTRACTION     bilateral   COLONOSCOPY  06/22/2011   Procedure: COLONOSCOPY;  Surgeon: Lafayette Dragon, MD;  [facility];  Service: Endoscopy;  Laterality: N/A;   GASTRIC VARICES BANDING  06/22/2011   Procedure: HEMORRHOID BANDING;  Surgeon: Lafayette Dragon, MD;  [facility];  Service: Endoscopy;  Laterality: N/A;   HEMICOLECTOMY  2012   emergent surgery [facility]   HIATAL HERNIA REPAIR     x 2   TONSILLECTOMY     TOTAL ABDOMINAL HYSTERECTOMY W/ BILATERAL SALPINGOOPHORECTOMY  1998    Family History  Problem Relation Age of Onset   Colon cancer Mother    COPD Father    Heart attack Father    Stroke Maternal Aunt    Colon polyps Brother    Liver disease Maternal Aunt    Breast cancer Maternal Aunt    Hypertension Son    Hypothyroidism Daughter     Allergies  Allergen Reactions   Morphine And Related Nausea And Vomiting   Penicillins Hives    Has patient had a PCN reaction causing immediate rash, facial/tongue/throat swelling, SOB or lightheadedness with hypotension: No Has patient had a PCN reaction  causing severe rash involving mucus membranes or skin necrosis: No Has patient had a PCN reaction that required hospitalization No Has patient had a PCN reaction occurring within the last 10 years: No If all of the above answers are "NO", then may proceed with Cephalosporin use.    Current Outpatient Medications on File Prior to Visit  Medication Sig Dispense Refill   albuterol (VENTOLIN HFA) 108 (90 Base) MCG/ACT inhaler Inhale 2 puffs into  the lungs every 6 (six) hours as needed for wheezing or shortness of breath. 8 g 2   alendronate (FOSAMAX) 70 MG tablet TAKE WITH A FULL GLASS OF WATER ON AN EMPTY STOMACH.TAKE 1 TABLET BY MOUTH ONCE A WEEK IN THE MORNING WITH A GLASS OF WATER. DO NOT EAT OR LIE DOWN FOR 30 MINUTES. 12 tablet 2   escitalopram (LEXAPRO) 10 MG tablet TAKE 1 TABLET BY MOUTH EVERY DAY 90 tablet 1   Fluticasone-Umeclidin-Vilant (TRELEGY ELLIPTA) 100-62.5-25 MCG/INH AEPB Inhale 1 puff into the lungs daily. 60 each 6   gabapentin (NEURONTIN) 300 MG capsule TAKE 1 CAPSULE BY MOUTH TWICE A DAY 180 capsule 1   ipratropium-albuterol (DUONEB) 0.5-2.5 (3) MG/3ML SOLN Take 3 mLs by nebulization 3 (three) times daily. 120 mL 3   levothyroxine (SYNTHROID) 75 MCG tablet TAKE ONE (1) TABLET ONCE DAILY BEFORE BREAKFAST 90 tablet 0   methylPREDNISolone (MEDROL) 4 MG tablet TAKE 1 TABLET BY MOUTH EVERY DAY 90 tablet 0   naproxen sodium (ALEVE) 220 MG tablet Take 220 mg by mouth daily as needed.     pseudoephedrine-guaifenesin (MUCINEX D) 60-600 MG 12 hr tablet Take 1 tablet by mouth 2 (two) times daily as needed for congestion.     simvastatin (ZOCOR) 20 MG tablet TAKE 1 TABLET BY MOUTH EVERYDAY AT BEDTIME 90 tablet 0   temazepam (RESTORIL) 15 MG capsule TAKE 1 CAPSULE BY MOUTH AT BEDTIME AS NEEDED FOR SLEEP 30 capsule 2   No current facility-administered medications on file prior to visit.    BP 110/78   Pulse 84   Temp (!) 97.4 F (36.3 C) (Oral)   Ht 5' (1.524 m)   Wt 104 lb (47.2 kg)    SpO2 91%   BMI 20.31 kg/m       Objective:   Physical Exam Vitals and nursing note reviewed.  Constitutional:      Appearance: Normal appearance.  Cardiovascular:     Rate and Rhythm: Normal rate and regular rhythm.     Pulses: Normal pulses.     Heart sounds: Normal heart sounds.  Pulmonary:     Effort: Pulmonary effort is normal.     Breath sounds: Normal breath sounds.  Abdominal:     General: Abdomen is flat. Bowel sounds are normal.     Palpations: Abdomen is soft.     Tenderness: There is no abdominal tenderness.  Musculoskeletal:        General: Tenderness (throughout sacrum and lower lumbar spine) present.  Skin:    General: Skin is warm and dry.  Neurological:     General: No focal deficit present.     Mental Status: She is alert and oriented to person, place, and time.  Psychiatric:        Mood and Affect: Mood normal.        Behavior: Behavior normal.        Thought Content: Thought content normal.        Judgment: Judgment normal.        Assessment & Plan:  1. Sacral back pain -Check sacral x-ray for fracture.  We will have her switch to Tylenol.  She can use a heating pad.  Consider slightly stronger pain medication if she has a fracture - DG Sacrum/Coccyx; Future - CBC with Differential/Platelet; Future - Basic Metabolic Panel; Future - Basic Metabolic Panel - CBC with Differential/Platelet  2. Abdominal discomfort -Likely due to overuse of NSAIDs.  We will have her stop taking Aleve.  She can transition to Tylenol but advised on recommended daily dosing and not to exceed 4 g in 24 hours. - omeprazole (PRILOSEC) 20 MG capsule; Take 1 capsule (20 mg total) by mouth daily.  Dispense: 30 capsule; Refill: 0 - CBC with Differential/Platelet; Future - Basic Metabolic Panel; Future - Basic Metabolic Panel - CBC with Differential/Platelet  3. Decreased appetite  - omeprazole (PRILOSEC) 20 MG capsule; Take 1 capsule (20 mg total) by mouth daily.   Dispense: 30 capsule; Refill: 0 - CBC with Differential/Platelet; Future - Basic Metabolic Panel; Future - Basic Metabolic Panel - CBC with Differential/Platelet  Dorothyann Peng, NP

## 2022-02-14 NOTE — Patient Instructions (Addendum)
I want you to take two extra strength tylenol in the morning and afternoon. You can take the advil in the evening   I will let Heidi [name] know what the xray shows   I am going to send in some Prilosec to take daily for a month to help your stomach

## 2022-02-15 ENCOUNTER — Telehealth: Payer: Self-pay | Admitting: Adult Health

## 2022-02-15 DIAGNOSIS — D649 Anemia, unspecified: Secondary | ICD-10-CM

## 2022-02-15 NOTE — Telephone Encounter (Signed)
Spoke to patients caretaker [name] and informed her of xrays and blood work   Her hemoglobin was down to 9.8   Xray did not show fracture   Will recheck CBC in 3-4 weeks   Family reports no active GI bleed

## 2022-03-08 ENCOUNTER — Other Ambulatory Visit: Payer: Self-pay | Admitting: Adult Health

## 2022-03-08 DIAGNOSIS — R109 Unspecified abdominal pain: Secondary | ICD-10-CM

## 2022-03-08 DIAGNOSIS — R630 Anorexia: Secondary | ICD-10-CM

## 2022-04-05 ENCOUNTER — Other Ambulatory Visit: Payer: Self-pay | Admitting: Adult Health

## 2022-04-05 DIAGNOSIS — R630 Anorexia: Secondary | ICD-10-CM

## 2022-04-05 DIAGNOSIS — R109 Unspecified abdominal pain: Secondary | ICD-10-CM

## 2022-04-12 ENCOUNTER — Other Ambulatory Visit: Payer: Self-pay | Admitting: Adult Health

## 2022-04-12 DIAGNOSIS — R630 Anorexia: Secondary | ICD-10-CM

## 2022-04-12 DIAGNOSIS — R109 Unspecified abdominal pain: Secondary | ICD-10-CM

## 2022-04-12 DIAGNOSIS — F39 Unspecified mood [affective] disorder: Secondary | ICD-10-CM

## 2022-04-12 DIAGNOSIS — E039 Hypothyroidism, unspecified: Secondary | ICD-10-CM

## 2022-04-12 DIAGNOSIS — G4700 Insomnia, unspecified: Secondary | ICD-10-CM

## 2022-04-13 NOTE — Telephone Encounter (Signed)
Spoke to pt daughter and advised that pt is due for CPE. Pt daughter verbalized understanding.  The only medication that is needed per daughter is the temazepam. She advised that she will call back later to schedule pt a Cpe.

## 2022-04-13 NOTE — Telephone Encounter (Signed)
Okay for refill?    LOV  02/2022   Last Refill      temazepam (RESTORIL) 15 MG capsule 30 capsule 2 01/11/2022   Sig:   TAKE 1 CAPSULE BY MOUTH AT BEDTIME AS NEEDED FOR SLEEP      Route:   (none)      Note to Pharmacy:   This request is for a new prescription for a controlled substance as required by Federal/State law.

## 2022-04-14 MED ORDER — LEVOTHYROXINE SODIUM 75 MCG PO TABS: ORAL_TABLET | ORAL | 0 refills | Status: DC

## 2022-04-14 MED ORDER — ESCITALOPRAM OXALATE 10 MG PO TABS: ORAL_TABLET | ORAL | 0 refills | Status: DC

## 2022-04-14 MED ORDER — METHYLPREDNISOLONE 4 MG PO TABS
ORAL_TABLET | ORAL | 0 refills | Status: DC
Start: 2022-04-14 — End: 2022-08-08

## 2022-04-14 MED ORDER — OMEPRAZOLE 20 MG PO CPDR: 20.0000 mg | DELAYED_RELEASE_CAPSULE | Freq: Every day | ORAL | 0 refills | Status: DC

## 2022-04-14 MED ORDER — SIMVASTATIN 20 MG PO TABS: ORAL_TABLET | ORAL | 0 refills | Status: DC

## 2022-05-23 ENCOUNTER — Encounter: Payer: Medicare Other | Admitting: Adult Health

## 2022-05-31 ENCOUNTER — Ambulatory Visit (INDEPENDENT_AMBULATORY_CARE_PROVIDER_SITE_OTHER): Payer: Medicare Other | Admitting: Adult Health

## 2022-05-31 ENCOUNTER — Encounter: Payer: Self-pay | Admitting: Adult Health

## 2022-05-31 ENCOUNTER — Ambulatory Visit (INDEPENDENT_AMBULATORY_CARE_PROVIDER_SITE_OTHER): Payer: Medicare Other

## 2022-05-31 VITALS — BP 120/60 | HR 65 | Temp 97.9°F | Wt 97.0 lb

## 2022-05-31 VITALS — BP 120/60 | HR 65 | Temp 97.9°F | Ht 60.0 in | Wt 97.9 lb

## 2022-05-31 DIAGNOSIS — E785 Hyperlipidemia, unspecified: Secondary | ICD-10-CM

## 2022-05-31 DIAGNOSIS — G4700 Insomnia, unspecified: Secondary | ICD-10-CM | POA: Diagnosis not present

## 2022-05-31 DIAGNOSIS — F39 Unspecified mood [affective] disorder: Secondary | ICD-10-CM

## 2022-05-31 DIAGNOSIS — M810 Age-related osteoporosis without current pathological fracture: Secondary | ICD-10-CM

## 2022-05-31 DIAGNOSIS — E039 Hypothyroidism, unspecified: Secondary | ICD-10-CM | POA: Diagnosis not present

## 2022-05-31 DIAGNOSIS — Z0000 Encounter for general adult medical examination without abnormal findings: Secondary | ICD-10-CM

## 2022-05-31 DIAGNOSIS — D649 Anemia, unspecified: Secondary | ICD-10-CM | POA: Diagnosis not present

## 2022-05-31 DIAGNOSIS — F411 Generalized anxiety disorder: Secondary | ICD-10-CM

## 2022-05-31 DIAGNOSIS — J449 Chronic obstructive pulmonary disease, unspecified: Secondary | ICD-10-CM | POA: Diagnosis not present

## 2022-05-31 DIAGNOSIS — Z23 Encounter for immunization: Secondary | ICD-10-CM | POA: Diagnosis not present

## 2022-05-31 LAB — COMPREHENSIVE METABOLIC PANEL
ALT: 7 U/L (ref 0–35)
AST: 14 U/L (ref 0–37)
Albumin: 4.3 g/dL (ref 3.5–5.2)
Alkaline Phosphatase: 27 U/L — ABNORMAL LOW (ref 39–117)
BUN: 17 mg/dL (ref 6–23)
CO2: 35 mEq/L — ABNORMAL HIGH (ref 19–32)
Calcium: 9.5 mg/dL (ref 8.4–10.5)
Chloride: 100 mEq/L (ref 96–112)
Creatinine, Ser: 0.67 mg/dL (ref 0.40–1.20)
GFR: 82.37 mL/min (ref >60.00)
Glucose, Bld: 115 mg/dL — ABNORMAL HIGH (ref 70–99)
Potassium: 5 mEq/L (ref 3.5–5.1)
Sodium: 139 mEq/L (ref 135–145)
Total Bilirubin: 0.4 mg/dL (ref 0.2–1.2)
Total Protein: 6.7 g/dL (ref 6.0–8.3)

## 2022-05-31 LAB — LIPID PANEL
Cholesterol: 176 mg/dL (ref 0–200)
HDL: 86.9 mg/dL (ref >39.00)
LDL Cholesterol: 73 mg/dL (ref 0–99)
NonHDL: 88.82
Total CHOL/HDL Ratio: 2
Triglycerides: 81 mg/dL (ref 0.0–149.0)
VLDL: 16.2 mg/dL (ref 0.0–40.0)

## 2022-05-31 LAB — CBC WITH DIFFERENTIAL/PLATELET
Basophils Absolute: 0 10*3/uL (ref 0.0–0.1)
Basophils Relative: 0.5 % (ref 0.0–3.0)
Eosinophils Absolute: 0 10*3/uL (ref 0.0–0.7)
Eosinophils Relative: 0.2 % (ref 0.0–5.0)
HCT: 31.2 % — ABNORMAL LOW (ref 36.0–46.0)
Hemoglobin: 9.5 g/dL — ABNORMAL LOW (ref 12.0–15.0)
Lymphocytes Relative: 14.8 % (ref 12.0–46.0)
Lymphs Abs: 1.2 10*3/uL (ref 0.7–4.0)
MCHC: 30.5 g/dL (ref 30.0–36.0)
MCV: 83.5 fl (ref 78.0–100.0)
Monocytes Absolute: 0.3 10*3/uL (ref 0.1–1.0)
Monocytes Relative: 4 % (ref 3.0–12.0)
Neutro Abs: 6.4 10*3/uL (ref 1.4–7.7)
Neutrophils Relative %: 80.5 % — ABNORMAL HIGH (ref 43.0–77.0)
Platelets: 290 10*3/uL (ref 150.0–400.0)
RBC: 3.74 Mil/uL — ABNORMAL LOW (ref 3.87–5.11)
RDW: 18 % — ABNORMAL HIGH (ref 11.5–15.5)
WBC: 7.9 10*3/uL (ref 4.0–10.5)

## 2022-05-31 LAB — IBC + FERRITIN
Ferritin: 7.3 ng/mL — ABNORMAL LOW (ref 10.0–291.0)
Iron: 22 ug/dL — ABNORMAL LOW (ref 42–145)
Saturation Ratios: 4.9 % — ABNORMAL LOW (ref 20.0–50.0)
TIBC: 446.6 ug/dL (ref 250.0–450.0)
Transferrin: 319 mg/dL (ref 212.0–360.0)

## 2022-05-31 LAB — VITAMIN D 25 HYDROXY (VIT D DEFICIENCY, FRACTURES): VITD: 48.91 ng/mL (ref 30.00–100.00)

## 2022-05-31 LAB — TSH: TSH: 0.42 u[IU]/mL (ref 0.35–5.50)

## 2022-05-31 MED ORDER — TRELEGY ELLIPTA 100-62.5-25 MCG/ACT IN AEPB: 1.0000 | INHALATION_SPRAY | Freq: Every day | RESPIRATORY_TRACT | 6 refills | Status: DC

## 2022-05-31 MED ORDER — ALENDRONATE SODIUM 70 MG PO TABS: 70.0000 mg | ORAL_TABLET | ORAL | 3 refills | Status: DC

## 2022-05-31 NOTE — Addendum Note (Signed)
Addended by: Apolinar Junes on: 05/31/2022 11:35 AM   Modules accepted: Level of Service

## 2022-05-31 NOTE — Progress Notes (Signed)
Subjective:   Heidi [name] is a 81 y.o. female who presents for Medicare Annual (Subsequent) preventive examination.  Review of Systems     Cardiac Risk Factors include: advanced age (>52mn, >>38women);Other (see comment), Risk factor comments: COPD     Objective:    Today's Vitals   05/31/22 0841  BP: 120/60  Pulse: 65  Temp: 97.9 F (36.6 C)  TempSrc: Oral  SpO2: 99%  Weight: 97 lb 14.4 oz (44.4 kg)  Height: 5' (1.524 m)   Body mass index is 19.12 kg/m.     05/31/2022    9:14 AM 05/13/2021    9:11 AM 04/13/2017    8:12 AM 07/26/2016    8:37 AM 04/19/2015   11:04 AM 06/22/2011    2:28 PM  Advanced Directives  Does Patient Have a Medical Advance Directive? Yes No Yes Yes No Patient does not have advance directive  Type of Advance Directive [place]    Does patient want to make changes to medical advance directive?   No - Patient declined No - Patient declined    Copy of [place]? No - copy requested  No - copy requested     Would patient like information on creating a medical advance directive?  No - Patient declined   No - patient declined information     Current Medications (verified) Outpatient Encounter Medications as of 05/31/2022  Medication Sig   albuterol (VENTOLIN HFA) 108 (90 Base) MCG/ACT inhaler Inhale 2 puffs into the lungs every 6 (six) hours as needed for wheezing or shortness of breath.   alendronate (FOSAMAX) 70 MG tablet TAKE WITH A FULL GLASS OF WATER ON AN EMPTY STOMACH.TAKE 1 TABLET BY MOUTH ONCE A WEEK IN THE MORNING WITH A GLASS OF WATER. DO NOT EAT OR LIE DOWN FOR 30 MINUTES.   escitalopram (LEXAPRO) 10 MG tablet TAKE 1 TABLET BY MOUTH EVERY DAY   Fluticasone-Umeclidin-Vilant (TRELEGY ELLIPTA) 100-62.5-25 MCG/INH AEPB Inhale 1 puff into the lungs daily.   gabapentin (NEURONTIN) 300 MG capsule TAKE 1  CAPSULE BY MOUTH TWICE A DAY   ipratropium-albuterol (DUONEB) 0.5-2.5 (3) MG/3ML SOLN Take 3 mLs by nebulization 3 (three) times daily.   levothyroxine (SYNTHROID) 75 MCG tablet Please schedule physical exam   methylPREDNISolone (MEDROL) 4 MG tablet TAKE 1 TABLET BY MOUTH EVERY DAY   naproxen sodium (ALEVE) 220 MG tablet Take 220 mg by mouth daily as needed.   omeprazole (PRILOSEC) 20 MG capsule Take 1 capsule (20 mg total) by mouth daily. Please schedule physical exam   pseudoephedrine-guaifenesin (MUCINEX D) 60-600 MG 12 hr tablet Take 1 tablet by mouth 2 (two) times daily as needed for congestion.   simvastatin (ZOCOR) 20 MG tablet TAKE 1 TABLET BY MOUTH EVERYDAY AT BEDTIME   temazepam (RESTORIL) 15 MG capsule TAKE 1 CAPSULE BY MOUTH AT BEDTIME AS NEEDED FOR SLEEP   No facility-administered encounter medications on file as of 05/31/2022.    Allergies (verified) Morphine and related and Penicillins   History: Past Medical History:  Diagnosis Date   Arthritis    Cerebrovascular disease    Chronic diastolic CHF (congestive heart failure) ([place] 02/23/2016   COPD (chronic obstructive pulmonary disease) (H[facility])    Depression    Diverticulosis    Esophageal stricture    Generalized anxiety disorder    History of pneumonia    History of stroke  mild oncoming   Hx of adenomatous colonic polyps    with high grade dysplasia   Hyperlipidemia    Hypothyroidism    Internal hemorrhoids    Osteoporosis    Pneumonia    HX of   Positive ANA (antinuclear antibody)    Pulmonary nodule    Rectal ulcer    Shortness of breath    periodically with COPD flair up   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([place])    Past Surgical History:  Procedure Laterality Date   BLADDER SURGERY     tack   CATARACT EXTRACTION     bilateral   COLONOSCOPY  06/22/2011   Procedure: COLONOSCOPY;  Surgeon: Lafayette Dragon, MD;  Location: [facility];  Service: Endoscopy;  Laterality: N/A;   GASTRIC VARICES BANDING  06/22/2011    Procedure: HEMORRHOID BANDING;  Surgeon: Lafayette Dragon, MD;  Location: [facility];  Service: Endoscopy;  Laterality: N/A;   HEMICOLECTOMY  2012   emergent surgery [facility]   HIATAL HERNIA REPAIR     x 2   TONSILLECTOMY     TOTAL ABDOMINAL HYSTERECTOMY W/ BILATERAL SALPINGOOPHORECTOMY  1998   Family History  Problem Relation Age of Onset   Colon cancer Mother    COPD Father    Heart attack Father    Stroke Maternal Aunt    Colon polyps Brother    Liver disease Maternal Aunt    Breast cancer Maternal Aunt    Hypertension Son    Hypothyroidism Daughter    Social History   Socioeconomic History   Marital status: Widowed    Spouse name: Not on file   Number of children: 3   Years of education: Not on file   Highest education level: Not on file  Occupational History   Occupation: RETIRED    Employer: RETIRED  Tobacco Use   Smoking status: Former    Packs/day: 2.50    Years: 50.00    Total pack years: 125.00    Types: Cigarettes    Quit date: 11/01/2009    Years since quitting: 12.5   Smokeless tobacco: Never  Vaping Use   Vaping Use: Never used  Substance and Sexual Activity   Alcohol use: No   Drug use: No   Sexual activity: Not Currently  Other Topics Concern   Not on file  Social History Narrative   Patient has no recent travel. She is smoking 2 packs per day and has smoked since age of 68. Has cats and dogs at home. No bird or mold exposure.       dtr with temporary    Social Determinants of Health   Financial Resource Strain: Low Risk  (05/31/2022)   Overall Financial Resource Strain (CARDIA)    Difficulty of Paying Living Expenses: Not hard at all  Food Insecurity: No Food Insecurity (05/31/2022)   Hunger Vital Sign    Worried About Running Out of Food in the Last Year: Never true    Ran Out of Food in the Last Year: Never true  Transportation Needs: No Transportation Needs (05/31/2022)   PRAPARE - Hydrologist  (Medical): No    Lack of Transportation (Non-Medical): No  Physical Activity: Insufficiently Active (05/31/2022)   Exercise Vital Sign    Days of Exercise per Week: 7 days    Minutes of Exercise per Session: 10 min  Stress: No Stress Concern Present (05/31/2022)   [place]  Feeling of Stress : Not at all  Social Connections: Moderately Integrated (05/31/2022)   Social Connection and Isolation Panel [NHANES]    Frequency of Communication with Friends and Family: More than three times a week    Frequency of Social Gatherings with Friends and Family: More than three times a week    Attends Religious Services: More than 4 times per year    Active Member of [employer] or Organizations: Yes    Attends Archivist Meetings: More than 4 times per year    Marital Status: Widowed    Tobacco Counseling Counseling given: Not Answered   Clinical Intake:  Pre-visit preparation completed: No  Pain : No/denies pain     BMI - recorded: 19.12 Nutritional Status: BMI of 19-24  Normal Nutritional Risks: None  How often do you need to have someone help you when you read instructions, pamphlets, or other written materials from your doctor or pharmacy?: 1 - Never  Diabetic?  No  Interpreter Needed?: No  Information entered by :: Rolene Arbour LPN   Activities of Daily Living    05/31/2022    9:09 AM  In your present state of health, do you have any difficulty performing the following activities:  Hearing? 0  Vision? 0  Difficulty concentrating or making decisions? 0  Walking or climbing stairs? 0  Dressing or bathing? 0  Doing errands, shopping? 0  Preparing Food and eating ? N  Using the Toilet? N  In the past six months, have you accidently leaked urine? Y  Comment Wears Pads.Followed by PCP  Do you have problems with loss of bowel control? N  Managing your Medications? N  Managing your Finances? N   Housekeeping or managing your Housekeeping? N    Patient Care Team: Dorothyann Peng, NP as PCP - General (Family Medicine)  Indicate any recent Medical Services you may have received from other than [facility] providers in the past year (date may be approximate).     Assessment:   This is a routine wellness examination for Heidi [name].  Hearing/Vision screen Hearing Screening - Comments:: Denies hearing difficulties   Vision Screening - Comments:: Wears rx glasses - up to date with routine eye exams with  Dr Gershon Crane  Dietary issues and exercise activities discussed: Exercise limited by: orthopedic condition(s)   Goals Addressed               This Visit's Progress     No current goals (pt-stated)         Depression Screen    05/31/2022    9:07 AM 05/13/2021    9:12 AM 05/13/2021    9:09 AM 03/17/2021    7:12 AM 12/06/2017   12:44 PM 04/13/2017    8:09 AM 07/26/2016    8:44 AM  PHQ 2/9 Scores  PHQ - 2 Score 0 0 0 '1 2 3 '$ 0  PHQ- 9 Score    '5 6 7     '$ Fall Risk    05/31/2022    9:11 AM 05/13/2021    9:12 AM 03/17/2021    7:12 AM 02/13/2019    2:37 PM 12/06/2017   12:44 PM  Fall Risk   Falls in the past year? 1 0 0 0 No  Comment    Emmi Telephone Survey: data to providers prior to load   Number falls in past yr: 0 0 0    Injury with Fall? 0 0 0    Comment Followed by medical attention. No  injury      Risk for fall due to : No Fall Risks No Fall Risks   Impaired balance/gait;Impaired mobility  Follow up Falls prevention discussed Falls evaluation completed       FALL RISK PREVENTION PERTAINING TO THE HOME:  Any stairs in or around the home? Yes  If so, are there any without handrails? No  Home free of loose throw rugs in walkways, pet beds, electrical cords, etc? Yes  Adequate lighting in your home to reduce risk of falls? Yes   ASSISTIVE DEVICES UTILIZED TO PREVENT FALLS:  Life alert? No  Use of a cane, walker or w/c? Yes  Grab bars in the bathroom? Yes  Shower chair or  bench in shower? Yes  Elevated toilet seat or a handicapped toilet? Yes   TIMED UP AND GO:  Was the test performed? Yes .  Length of time to ambulate 10 feet: 10 sec.     Cognitive Function:        05/31/2022    9:15 AM  6CIT Screen  What Year? 0 points  What month? 0 points  What time? 0 points  Count back from 20 0 points  Months in reverse 0 points  Repeat phrase 0 points  Total Score 0 points    Immunizations Immunization History  Administered Date(s) Administered   Fluad Quad(high Dose 65+) 04/22/2019, 06/09/2020, 03/17/2021, 05/31/2022   H1N1 06/30/2008   Influenza Split 06/10/2007, 04/14/2010, 04/18/2012   Influenza Whole 06/10/2007, 04/14/2010, 04/18/2012   Influenza, High Dose Seasonal PF 06/02/2015, 04/10/2016, 04/11/2017, 04/25/2018   Influenza,inj,Quad PF,6+ Mos 04/16/2013   Parker SARS COV-2 Pediatric Vaccination 61mo to <663yr11/07/2019   Moderna SARS-COV2 Booster Vaccination 10/16/2019, 06/29/2020   Pneumococcal Conjugate-13 06/02/2015   Pneumococcal Polysaccharide-23 05/17/2006, 04/16/2013   Td 03/29/1999   Tdap 03/29/1999, 04/16/2013   Zoster, Live 07/17/2013    TDAP status: Up to date  Flu Vaccine status: Completed at today's visit  Pneumococcal vaccine status: Up to date  Covid-19 vaccine status: Completed vaccines  Qualifies for Shingles Vaccine? Yes   Zostavax completed No   Shingrix Completed?: No.    Education has been provided regarding the importance of this vaccine. Patient has been advised to call insurance company to determine out of pocket expense if they have not yet received this vaccine. Advised may also receive vaccine at local pharmacy or Health Dept. Verbalized acceptance and understanding.  Screening Tests Health Maintenance  Topic Date Due   Lung Cancer Screening  02/26/2018   COVID-19 Vaccine (1) 06/16/2022 (Originally 06/29/2020)   Zoster Vaccines- Shingrix (1 of 2) 08/31/2022 (Originally 09/06/1991)   TETANUS/TDAP   04/17/2023   Medicare Annual Wellness (AWV)  06/01/2023   Pneumonia Vaccine 6545Years old  Completed   INFLUENZA VACCINE  Completed   DEXA SCAN  Completed   HPV VACCINES  Aged Out    Health Maintenance  Health Maintenance Due  Topic Date Due   Lung Cancer Screening  02/26/2018    Colorectal cancer screening: No longer required.   Mammogram status: No longer required due to Age.  Bone Density status: Completed 02/09/15. Results reflect: Bone density results: OSTEOPOROSIS. Repeat every   years.  Lung Cancer Screening: (Low Dose CT Chest recommended if Age 80-80ears, 30 pack-year currently smoking OR have quit w/in 15years.)does qualify.     Additional Screening:  Hepatitis C Screening: does not qualify; Completed   Vision Screening: Recommended annual ophthalmology exams for early detection of glaucoma and other disorders of  the eye. Is the patient up to date with their annual eye exam?  No  Who is the provider or what is the name of the office in which the patient attends annual eye exams? Dr Gershon Crane If pt is not established with a provider, would they like to be referred to a provider to establish care? No .   Dental Screening: Recommended annual dental exams for proper oral hygiene  Community Resource Referral / Chronic Care Management:  CRR required this visit?  No   CCM required this visit?  No      Plan:     I have personally reviewed and noted the following in the patient's chart:   Medical and social history Use of alcohol, tobacco or illicit drugs  Current medications and supplements including opioid prescriptions. Patient is not currently taking opioid prescriptions. Functional ability and status Nutritional status Physical activity Advanced directives List of other physicians Hospitalizations, surgeries, and ER visits in previous 12 months Vitals Screenings to include cognitive, depression, and falls Referrals and appointments  In addition, I  have reviewed and discussed with patient certain preventive protocols, quality metrics, and best practice recommendations. A written personalized care plan for preventive services as well as general preventive health recommendations were provided to patient.     Criselda Peaches, LPN   [id number]   Nurse Notes: None

## 2022-05-31 NOTE — Progress Notes (Signed)
Subjective:    Patient ID: Heidi [name], female    DOB: [birth year], 81 y.o.   MRN: [id number]  HPI Patient presents for yearly preventative medicine examination. She is a pleasant 81 year old female who  has a past medical history of Arthritis, Cerebrovascular disease, Chronic diastolic CHF (congestive heart failure) ([place]) (02/23/2016), COPD (chronic obstructive pulmonary disease) ([place]), Depression, Diverticulosis, Esophageal stricture, Generalized anxiety disorder, History of pneumonia, History of stroke, adenomatous colonic polyps, Hyperlipidemia, Hypothyroidism, Internal hemorrhoids, Osteoporosis, Pneumonia, Positive ANA (antinuclear antibody), Pulmonary nodule, Rectal ulcer, Shortness of breath, and 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([place]).  She continues to live by herself.  Her daughter-in-law Heidi [name] keeps a close eye on her and visits her almost every day.  She also has a small [place] that she cares for and enjoys the company  COPD -end-stage COPD.  He is no longer being seen by pulmonary, last time was in November 2020.  She is currently managed with allergy Ellipta 100-60 2.5-25 mg daily, chronic steroid therapy Medrol 4 mg daily, Mucinex, albuterol inhaler as needed and DuoNeb TID.  Continues to have baseline dyspnea on exertion but does not feel as though she needs to use her as needed nebulizer or rescue inhaler. Is on continuous oxygen therapy at 2-3 L via [place]   Hypothyroidism -managed with Synthroid 75 mcg daily  Insomnia-uses Restoril 15 mg nightly.  She feels as though she gets good sleep on this medication  Mood disorder/anxiety-currently managed with Lexapro 10 mg daily.  hyperlipidemia-takes Zocor 10 mg daily.  She denies myalgia or fatigue  Lab Results  Component Value Date   CHOL 188 03/17/2021   HDL 89.90 03/17/2021   LDLCALC 87 03/17/2021   LDLDIRECT 340.7 10/14/2012   TRIG 55.0 03/17/2021   CHOLHDL 2 03/17/2021   Osteoporosis-managed with Fosamax 70 mg every week.  She denies any  recent falls or fractures.  She is overdue for bone density screening, last was in 2016 showed a T score of -4.8  All immunizations and health maintenance protocols were reviewed with the patient and needed orders were placed.  Appropriate screening laboratory values were ordered for the patient including screening of hyperlipidemia, renal function and hepatic function.  Medication reconciliation,  past medical history, social history, problem list and allergies were reviewed in detail with the patient  Goals were established with regard to weight loss, exercise, and  diet in compliance with medications  Review of Systems  Constitutional: Negative.   HENT: Negative.    Eyes: Negative.   Respiratory:  Positive for cough, shortness of breath and wheezing.   Cardiovascular: Negative.   Gastrointestinal: Negative.   Endocrine: Negative.   Genitourinary: Negative.   Musculoskeletal:  Positive for arthralgias, back pain and gait problem.  Skin: Negative.   Allergic/Immunologic: Negative.   Hematological: Negative.   Psychiatric/Behavioral: Negative.     Past Medical History:  Diagnosis Date   Arthritis    Cerebrovascular disease    Chronic diastolic CHF (congestive heart failure) ([place]) 02/23/2016   COPD (chronic obstructive pulmonary disease) ([facility])    Depression    Diverticulosis    Esophageal stricture    Generalized anxiety disorder    History of pneumonia    History of stroke    mild oncoming   Hx of adenomatous colonic polyps    with high grade dysplasia   Hyperlipidemia    Hypothyroidism    Internal hemorrhoids    Osteoporosis    Pneumonia    HX of   Positive  ANA (antinuclear antibody)    Pulmonary nodule    Rectal ulcer    Shortness of breath    periodically with COPD flair up   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([place])     Social History   Socioeconomic History   Marital status: Widowed    Spouse name: Not on file   Number of children: 3   Years of education: Not on file    Highest education level: Not on file  Occupational History   Occupation: RETIRED    Employer: RETIRED  Tobacco Use   Smoking status: Former    Packs/day: 2.50    Years: 50.00    Total pack years: 125.00    Types: Cigarettes    Quit date: 11/01/2009    Years since quitting: 12.5   Smokeless tobacco: Never  Vaping Use   Vaping Use: Never used  Substance and Sexual Activity   Alcohol use: No   Drug use: No   Sexual activity: Not Currently  Other Topics Concern   Not on file  Social History Narrative   Patient has no recent travel. She is smoking 2 packs per day and has smoked since age of 27. Has cats and dogs at home. No bird or mold exposure.       dtr with temporary    Social Determinants of Health   Financial Resource Strain: Low Risk  (05/31/2022)   Overall Financial Resource Strain (CARDIA)    Difficulty of Paying Living Expenses: Not hard at all  Food Insecurity: No Food Insecurity (05/31/2022)   Hunger Vital Sign    Worried About Running Out of Food in the Last Year: Never true    Ran Out of Food in the Last Year: Never true  Transportation Needs: No Transportation Needs (05/31/2022)   PRAPARE - Hydrologist (Medical): No    Lack of Transportation (Non-Medical): No  Physical Activity: Insufficiently Active (05/31/2022)   Exercise Vital Sign    Days of Exercise per Week: 7 days    Minutes of Exercise per Session: 10 min  Stress: No Stress Concern Present (05/31/2022)   Power    Feeling of Stress : Not at all  Social Connections: Moderately Integrated (05/31/2022)   Social Connection and Isolation Panel [NHANES]    Frequency of Communication with Friends and Family: More than three times a week    Frequency of Social Gatherings with Friends and Family: More than three times a week    Attends Religious Services: More than 4 times per year    Active Member of [employer] or  Organizations: Yes    Attends Archivist Meetings: More than 4 times per year    Marital Status: Widowed  Intimate Partner Violence: Not At Risk (05/31/2022)   Humiliation, Afraid, Rape, and Kick questionnaire    Fear of Current or Ex-Partner: No    Emotionally Abused: No    Physically Abused: No    Sexually Abused: No    Past Surgical History:  Procedure Laterality Date   BLADDER SURGERY     tack   CATARACT EXTRACTION     bilateral   COLONOSCOPY  06/22/2011   Procedure: COLONOSCOPY;  Surgeon: Lafayette Dragon, MD;  Location: [facility];  Service: Endoscopy;  Laterality: N/A;   GASTRIC VARICES BANDING  06/22/2011   Procedure: HEMORRHOID BANDING;  Surgeon: Lafayette Dragon, MD;  Location: [facility];  Service: Endoscopy;  Laterality: N/A;  HEMICOLECTOMY  2012   emergent surgery [facility]   HIATAL HERNIA REPAIR     x 2   TONSILLECTOMY     TOTAL ABDOMINAL HYSTERECTOMY W/ BILATERAL SALPINGOOPHORECTOMY  1998    Family History  Problem Relation Age of Onset   Colon cancer Mother    COPD Father    Heart attack Father    Stroke Maternal Aunt    Colon polyps Brother    Liver disease Maternal Aunt    Breast cancer Maternal Aunt    Hypertension Son    Hypothyroidism Daughter     Allergies  Allergen Reactions   Morphine And Related Nausea And Vomiting   Penicillins Hives    Has patient had a PCN reaction causing immediate rash, facial/tongue/throat swelling, SOB or lightheadedness with hypotension: No Has patient had a PCN reaction causing severe rash involving mucus membranes or skin necrosis: No Has patient had a PCN reaction that required hospitalization No Has patient had a PCN reaction occurring within the last 10 years: No If all of the above answers are "NO", then may proceed with Cephalosporin use.    Current Outpatient Medications on File Prior to Visit  Medication Sig Dispense Refill   albuterol (VENTOLIN HFA) 108 (90 Base) MCG/ACT inhaler Inhale 2  puffs into the lungs every 6 (six) hours as needed for wheezing or shortness of breath. 8 g 2   alendronate (FOSAMAX) 70 MG tablet TAKE WITH A FULL GLASS OF WATER ON AN EMPTY STOMACH.TAKE 1 TABLET BY MOUTH ONCE A WEEK IN THE MORNING WITH A GLASS OF WATER. DO NOT EAT OR LIE DOWN FOR 30 MINUTES. 12 tablet 2   escitalopram (LEXAPRO) 10 MG tablet TAKE 1 TABLET BY MOUTH EVERY DAY 90 tablet 0   Fluticasone-Umeclidin-Vilant (TRELEGY ELLIPTA) 100-62.5-25 MCG/INH AEPB Inhale 1 puff into the lungs daily. 60 each 6   gabapentin (NEURONTIN) 300 MG capsule TAKE 1 CAPSULE BY MOUTH TWICE A DAY 180 capsule 1   ipratropium-albuterol (DUONEB) 0.5-2.5 (3) MG/3ML SOLN Take 3 mLs by nebulization 3 (three) times daily. 120 mL 3   levothyroxine (SYNTHROID) 75 MCG tablet Please schedule physical exam 90 tablet 0   methylPREDNISolone (MEDROL) 4 MG tablet TAKE 1 TABLET BY MOUTH EVERY DAY 90 tablet 0   naproxen sodium (ALEVE) 220 MG tablet Take 220 mg by mouth daily as needed.     omeprazole (PRILOSEC) 20 MG capsule Take 1 capsule (20 mg total) by mouth daily. Please schedule physical exam 90 capsule 0   pseudoephedrine-guaifenesin (MUCINEX D) 60-600 MG 12 hr tablet Take 1 tablet by mouth 2 (two) times daily as needed for congestion.     simvastatin (ZOCOR) 20 MG tablet TAKE 1 TABLET BY MOUTH EVERYDAY AT BEDTIME 90 tablet 0   temazepam (RESTORIL) 15 MG capsule TAKE 1 CAPSULE BY MOUTH AT BEDTIME AS NEEDED FOR SLEEP 30 capsule 2   No current facility-administered medications on file prior to visit.    BP 120/60   Pulse 65   Temp 97.9 F (36.6 C)   Wt 97 lb (44 kg)   SpO2 99%   BMI 18.94 kg/m       Objective:   Physical Exam Vitals and nursing note reviewed.  Constitutional:      General: She is not in acute distress.    Appearance: Normal appearance. She is well-developed and underweight. She is ill-appearing (chronically).  HENT:     Head: Normocephalic and atraumatic.     Right Ear: Tympanic membrane, ear  canal and external ear normal. There is no impacted cerumen.     Left Ear: Tympanic membrane, ear canal and external ear normal. There is no impacted cerumen.     Nose: Nose normal. No congestion or rhinorrhea.     Mouth/Throat:     Mouth: Mucous membranes are moist.     Pharynx: Oropharynx is clear. No oropharyngeal exudate or posterior oropharyngeal erythema.  Eyes:     General:        Right eye: No discharge.        Left eye: No discharge.     Extraocular Movements: Extraocular movements intact.     Conjunctiva/sclera: Conjunctivae normal.     Pupils: Pupils are equal, round, and reactive to light.  Neck:     Thyroid: No thyromegaly.     Vascular: No carotid bruit.     Trachea: No tracheal deviation.  Cardiovascular:     Rate and Rhythm: Normal rate and regular rhythm.     Pulses: Normal pulses.     Heart sounds: Murmur heard.     Systolic murmur is present with a grade of 2/6.     No friction rub. No gallop.  Pulmonary:     Effort: Pulmonary effort is normal. No respiratory distress.     Breath sounds: Normal breath sounds. No stridor. No wheezing, rhonchi or rales.     Comments: 2 L via [place]  Chest:     Chest wall: No tenderness.  Abdominal:     General: Abdomen is flat. Bowel sounds are normal. There is no distension.     Palpations: Abdomen is soft. There is no mass.     Tenderness: There is no abdominal tenderness. There is no right CVA tenderness, left CVA tenderness, guarding or rebound.     Hernia: No hernia is present.  Musculoskeletal:        General: No swelling, tenderness, deformity or signs of injury. Normal range of motion.     Cervical back: Normal range of motion and neck supple.     Right lower leg: No edema.     Left lower leg: No edema.  Lymphadenopathy:     Cervical: No cervical adenopathy.  Skin:    General: Skin is warm and dry.     Coloration: Skin is not jaundiced or pale.     Findings: No bruising, erythema, lesion or rash.  Neurological:      General: No focal deficit present.     Mental Status: She is alert and oriented to person, place, and time.     Cranial Nerves: No cranial nerve deficit.     Sensory: No sensory deficit.     Motor: No weakness.     Coordination: Coordination normal.     Gait: Gait normal.     Deep Tendon Reflexes: Reflexes normal.  Psychiatric:        Mood and Affect: Mood normal.        Behavior: Behavior normal.        Thought Content: Thought content normal.        Judgment: Judgment normal.       Assessment & Plan:  1. Hypothyroidism, unspecified type - Consider dose change of synthroid  - CBC with Differential/Platelet; Future - Comprehensive metabolic panel; Future - TSH; Future - Lipid panel; Future  2. Insomnia, unspecified type - Well controlled with Restoril  - CBC with Differential/Platelet; Future - Comprehensive metabolic panel; Future - TSH; Future - Lipid panel; Future  3. Age-related  osteoporosis without current pathological fracture  - CBC with Differential/Platelet; Future - Comprehensive metabolic panel; Future - TSH; Future - Lipid panel; Future - VITAMIN D 25 Hydroxy (Vit-D Deficiency, Fractures); Future - DG Bone Density; Future - alendronate (FOSAMAX) 70 MG tablet; Take 1 tablet (70 mg total) by mouth once a week. Take with a full glass of water on an empty stomach.  Dispense: 12 tablet; Refill: 3  4. Mood disorder ([place]) - Well controlled with SSRI. No change  - CBC with Differential/Platelet; Future - Comprehensive metabolic panel; Future - TSH; Future - Lipid panel; Future  5. COPD mixed type ([place]) - continue with current treatment  - CBC with Differential/Platelet; Future - Comprehensive metabolic panel; Future - TSH; Future - Lipid panel; Future  6. Hyperlipidemia, unspecified hyperlipidemia type - Consider increase in statin  - CBC with Differential/Platelet; Future - Comprehensive metabolic panel; Future - TSH; Future - Lipid panel; Future  7.  Generalized anxiety disorder - Continue with SSRI  - CBC with Differential/Platelet; Future - Comprehensive metabolic panel; Future - TSH; Future - Lipid panel; Future  Dorothyann Peng, NP

## 2022-05-31 NOTE — Patient Instructions (Signed)
It was great seeing you today   We will follow up with you regarding your lab work   Please let me know if you need anything   

## 2022-05-31 NOTE — Patient Instructions (Addendum)
Heidi [name] , Thank you for taking time to come for your Medicare Wellness Visit. I appreciate your ongoing commitment to your health goals. Please review the following plan we discussed and let me know if I can assist you in the future.   These are the goals we discussed:  Goals       Exercise 150 minutes per week (moderate activity)      Exercise routinely; getting up and down from chair multiple times an hour Marching in place when not cleaning the home Cans of soup to exercise your arms       Gain weight      No current goals (pt-stated)      Prevent Falls        This is a list of the screening recommended for you and due dates:  Health Maintenance  Topic Date Due   Screening for Lung Cancer  02/26/2018   COVID-19 Vaccine (1) 06/16/2022*   Zoster (Shingles) Vaccine (1 of 2) 08/31/2022*   Tetanus Vaccine  04/17/2023   Medicare Annual Wellness Visit  06/01/2023   Pneumonia Vaccine  Completed   Flu Shot  Completed   DEXA scan (bone density measurement)  Completed   HPV Vaccine  Aged Out  *Topic was postponed. The date shown is not the original due date.    Advanced directives: Please bring a copy of your health care power of attorney and living will to the office to be added to your chart at your convenience.   Conditions/risks identified: None  Next appointment: Follow up in one year for your annual wellness visit    Preventive Care 65 Years and Older, Female Preventive care refers to lifestyle choices and visits with your health care provider that can promote health and wellness. What does preventive care include? A yearly physical exam. This is also called an annual well check. Dental exams once or twice a year. Routine eye exams. Ask your health care provider how often you should have your eyes checked. Personal lifestyle choices, including: Daily care of your teeth and gums. Regular physical activity. Eating a healthy diet. Avoiding tobacco and drug  use. Limiting alcohol use. Practicing safe sex. Taking low-dose aspirin every day. Taking vitamin and mineral supplements as recommended by your health care provider. What happens during an annual well check? The services and screenings done by your health care provider during your annual well check will depend on your age, overall health, lifestyle risk factors, and family history of disease. Counseling  Your health care provider may ask you questions about your: Alcohol use. Tobacco use. Drug use. Emotional well-being. Home and relationship well-being. Sexual activity. Eating habits. History of falls. Memory and ability to understand (cognition). Work and work Statistician. Reproductive health. Screening  You may have the following tests or measurements: Height, weight, and BMI. Blood pressure. Lipid and cholesterol levels. These may be checked every 5 years, or more frequently if you are over 25 years old. Skin check. Lung cancer screening. You may have this screening every year starting at age 7 if you have a 30-pack-year history of smoking and currently smoke or have quit within the past 15 years. Fecal occult blood test (FOBT) of the stool. You may have this test every year starting at age 81. Flexible sigmoidoscopy or colonoscopy. You may have a sigmoidoscopy every 5 years or a colonoscopy every 10 years starting at age 54. Hepatitis C blood test. Hepatitis B blood test. Sexually transmitted disease (STD) testing. Diabetes screening.  This is done by checking your blood sugar (glucose) after you have not eaten for a while (fasting). You may have this done every 1-3 years. Bone density scan. This is done to screen for osteoporosis. You may have this done starting at age 48. Mammogram. This may be done every 1-2 years. Talk to your health care provider about how often you should have regular mammograms. Talk with your health care provider about your test results, treatment  options, and if necessary, the need for more tests. Vaccines  Your health care provider may recommend certain vaccines, such as: Influenza vaccine. This is recommended every year. Tetanus, diphtheria, and acellular pertussis (Tdap, Td) vaccine. You may need a Td booster every 10 years. Zoster vaccine. You may need this after age 34. Pneumococcal 13-valent conjugate (PCV13) vaccine. One dose is recommended after age 69. Pneumococcal polysaccharide (PPSV23) vaccine. One dose is recommended after age 53. Talk to your health care provider about which screenings and vaccines you need and how often you need them. This information is not intended to replace advice given to you by your health care provider. Make sure you discuss any questions you have with your health care provider. Document Released: 07/30/2015 Document Revised: 03/22/2016 Document Reviewed: 05/04/2015 Elsevier Interactive Patient Education  2017 [place] Prevention in the Home Falls can cause injuries. They can happen to people of all ages. There are many things you can do to make your home safe and to help prevent falls. What can I do on the outside of my home? Regularly fix the edges of walkways and driveways and fix any cracks. Remove anything that might make you trip as you walk through a door, such as a raised step or threshold. Trim any bushes or trees on the path to your home. Use bright outdoor lighting. Clear any walking paths of anything that might make someone trip, such as rocks or tools. Regularly check to see if handrails are loose or broken. Make sure that both sides of any steps have handrails. Any raised decks and porches should have guardrails on the edges. Have any leaves, snow, or ice cleared regularly. Use sand or salt on walking paths during winter. Clean up any spills in your garage right away. This includes oil or grease spills. What can I do in the bathroom? Use night lights. Install grab  bars by the toilet and in the tub and shower. Do not use towel bars as grab bars. Use non-skid mats or decals in the tub or shower. If you need to sit down in the shower, use a plastic, non-slip stool. Keep the floor dry. Clean up any water that spills on the floor as soon as it happens. Remove soap buildup in the tub or shower regularly. Attach bath mats securely with double-sided non-slip rug tape. Do not have throw rugs and other things on the floor that can make you trip. What can I do in the bedroom? Use night lights. Make sure that you have a light by your bed that is easy to reach. Do not use any sheets or blankets that are too big for your bed. They should not hang down onto the floor. Have a firm chair that has side arms. You can use this for support while you get dressed. Do not have throw rugs and other things on the floor that can make you trip. What can I do in the kitchen? Clean up any spills right away. Avoid walking on wet floors. Keep items  that you use a lot in easy-to-reach places. If you need to reach something above you, use a strong step stool that has a grab bar. Keep electrical cords out of the way. Do not use floor polish or wax that makes floors slippery. If you must use wax, use non-skid floor wax. Do not have throw rugs and other things on the floor that can make you trip. What can I do with my stairs? Do not leave any items on the stairs. Make sure that there are handrails on both sides of the stairs and use them. Fix handrails that are broken or loose. Make sure that handrails are as long as the stairways. Check any carpeting to make sure that it is firmly attached to the stairs. Fix any carpet that is loose or worn. Avoid having throw rugs at the top or bottom of the stairs. If you do have throw rugs, attach them to the floor with carpet tape. Make sure that you have a light switch at the top of the stairs and the bottom of the stairs. If you do not have them,  ask someone to add them for you. What else can I do to help prevent falls? Wear shoes that: Do not have high heels. Have rubber bottoms. Are comfortable and fit you well. Are closed at the toe. Do not wear sandals. If you use a stepladder: Make sure that it is fully opened. Do not climb a closed stepladder. Make sure that both sides of the stepladder are locked into place. Ask someone to hold it for you, if possible. Clearly mark and make sure that you can see: Any grab bars or handrails. First and last steps. Where the edge of each step is. Use tools that help you move around (mobility aids) if they are needed. These include: Canes. Walkers. Scooters. Crutches. Turn on the lights when you go into a dark area. Replace any light bulbs as soon as they burn out. Set up your furniture so you have a clear path. Avoid moving your furniture around. If any of your floors are uneven, fix them. If there are any pets around you, be aware of where they are. Review your medicines with your doctor. Some medicines can make you feel dizzy. This can increase your chance of falling. Ask your doctor what other things that you can do to help prevent falls. This information is not intended to replace advice given to you by your health care provider. Make sure you discuss any questions you have with your health care provider. Document Released: 04/29/2009 Document Revised: 12/09/2015 Document Reviewed: 08/07/2014 Elsevier Interactive Patient Education  2017 [employer].

## 2022-06-13 ENCOUNTER — Other Ambulatory Visit: Payer: Self-pay | Admitting: Adult Health

## 2022-06-13 DIAGNOSIS — R630 Anorexia: Secondary | ICD-10-CM

## 2022-06-13 DIAGNOSIS — R109 Unspecified abdominal pain: Secondary | ICD-10-CM

## 2022-07-12 ENCOUNTER — Other Ambulatory Visit: Payer: Self-pay | Admitting: Adult Health

## 2022-07-12 DIAGNOSIS — G4700 Insomnia, unspecified: Secondary | ICD-10-CM

## 2022-07-12 DIAGNOSIS — E039 Hypothyroidism, unspecified: Secondary | ICD-10-CM

## 2022-07-12 NOTE — Telephone Encounter (Signed)
Okay for refill?  

## 2022-08-07 ENCOUNTER — Other Ambulatory Visit: Payer: Self-pay | Admitting: Adult Health

## 2022-08-07 DIAGNOSIS — F39 Unspecified mood [affective] disorder: Secondary | ICD-10-CM

## 2022-09-06 ENCOUNTER — Other Ambulatory Visit: Payer: Self-pay | Admitting: Adult Health

## 2022-09-06 DIAGNOSIS — R630 Anorexia: Secondary | ICD-10-CM

## 2022-09-06 DIAGNOSIS — R109 Unspecified abdominal pain: Secondary | ICD-10-CM

## 2022-10-06 ENCOUNTER — Other Ambulatory Visit: Payer: Self-pay | Admitting: Adult Health

## 2022-10-06 DIAGNOSIS — E039 Hypothyroidism, unspecified: Secondary | ICD-10-CM

## 2022-10-06 DIAGNOSIS — F39 Unspecified mood [affective] disorder: Secondary | ICD-10-CM

## 2022-10-06 DIAGNOSIS — G4700 Insomnia, unspecified: Secondary | ICD-10-CM

## 2022-10-06 NOTE — Telephone Encounter (Signed)
Okay for refill?  

## 2022-10-11 ENCOUNTER — Telehealth: Payer: Self-pay | Admitting: Adult Health

## 2022-10-11 NOTE — Telephone Encounter (Signed)
Pt says pharmacy is requesting a PA for  temazepam (RESTORIL) 15 MG capsule

## 2022-10-11 NOTE — Telephone Encounter (Signed)
This was sent already last week. Tried to call pt to advise. Not sure if the pharmacy is correct. Will check when I call back.

## 2022-10-11 NOTE — Telephone Encounter (Signed)
Called Pt again no answer.

## 2022-10-12 ENCOUNTER — Telehealth: Payer: Self-pay | Admitting: Adult Health

## 2022-10-12 NOTE — Telephone Encounter (Signed)
Pt's sister can be reached at [phone] [facility])

## 2022-10-12 NOTE — Telephone Encounter (Signed)
[facility] called to say they sent a fax last week requesting last office visit notes for this Pt.   They are asking that notes be faxed to:  [phone]

## 2022-10-12 NOTE — Telephone Encounter (Signed)
Pt aware medication was sent on 10/06/22, says the pharmacy told her it needed an prior authorization and they could not fill until that was completed. Pharmacy verified. Pt's sister is the one calling on her behalf

## 2022-10-12 NOTE — Telephone Encounter (Signed)
Forms were faxed to [place] 10/11/2022 at 3:30pm. Form was placed on providers desk to sign.Will fax back when providers fills out.

## 2022-10-12 NOTE — Telephone Encounter (Signed)
OV and form faxed to [facility]

## 2022-10-12 NOTE — Telephone Encounter (Signed)
Noted! PA started [employer] (Key: [id number]) PA Case ID #: [id number] Rx #: [id number] Need Help? Call us at [phone] Status Additional Information Required Drug Temazepam 15MG  capsules ePA cloud logo Form [employer] Electronic PA Form [phone] NCPDP) Original Claim Info 75,[phone] HRM PA REQUIRED IF 65 YRS OR OLDER-PRESCRIBER CALL [employer] REQUIRES PRIOR AUTHORIZATION

## 2022-10-13 ENCOUNTER — Telehealth: Payer: Self-pay

## 2022-10-13 NOTE — Telephone Encounter (Signed)
Pharmacy Patient Advocate Encounter  Received notification from Tat Momoli that the request for prior authorization for Temazepam 15MG  capsules has been denied due to not meeting the requirements for prior authorization.      Please be advised we currently do not have a Pharmacist to review denials, therefore you will need to process appeals accordingly as needed. Thanks for your support at this time.   You may call [phone] or fax [phone], to appeal.

## 2022-10-18 NOTE — Telephone Encounter (Signed)
Noted! Tried to call pt and her sister. However, pt sister was not able to hear me.

## 2022-10-18 NOTE — Telephone Encounter (Signed)
Rx denied. Tried to call pt sister and pt 2x sister unable to hear me.

## 2022-10-19 ENCOUNTER — Telehealth: Payer: Self-pay | Admitting: Adult Health

## 2022-10-19 NOTE — Telephone Encounter (Signed)
Form has been faxed.

## 2022-10-19 NOTE — Telephone Encounter (Signed)
Emma from [employer] called to confirm if we received a form for a blood pressure monitor for this patient. She said the form was faxed on 10/17/22 and it needs to be signed and returned by the provider. The call back number to confirm that the form was received is: [phone].

## 2022-10-19 NOTE — Telephone Encounter (Signed)
error 

## 2022-10-25 DIAGNOSIS — Z832 Family history of diseases of the blood and blood-forming organs and certain disorders involving the immune mechanism: Secondary | ICD-10-CM | POA: Diagnosis not present

## 2022-10-25 DIAGNOSIS — M069 Rheumatoid arthritis, unspecified: Secondary | ICD-10-CM | POA: Diagnosis not present

## 2022-10-25 DIAGNOSIS — Z1379 Encounter for other screening for genetic and chromosomal anomalies: Secondary | ICD-10-CM | POA: Diagnosis not present

## 2022-11-10 ENCOUNTER — Telehealth: Payer: Self-pay | Admitting: Adult Health

## 2022-11-10 NOTE — Telephone Encounter (Signed)
Myriam Jacobson with [employer] is calling and would like rx for pt to have orthopaedic back brace dx [phone] fax number is [phone]

## 2022-11-14 NOTE — Telephone Encounter (Signed)
Noted.   Rx faxed  

## 2022-11-14 NOTE — Telephone Encounter (Signed)
Please advise 

## 2022-11-15 NOTE — Telephone Encounter (Signed)
Rx fax confirmation received.

## 2022-12-01 DIAGNOSIS — Z8261 Family history of arthritis: Secondary | ICD-10-CM | POA: Diagnosis not present

## 2022-12-01 DIAGNOSIS — Z1379 Encounter for other screening for genetic and chromosomal anomalies: Secondary | ICD-10-CM | POA: Diagnosis not present

## 2022-12-01 DIAGNOSIS — F419 Anxiety disorder, unspecified: Secondary | ICD-10-CM | POA: Diagnosis not present

## 2022-12-01 DIAGNOSIS — Z820 Family history of epilepsy and other diseases of the nervous system: Secondary | ICD-10-CM | POA: Diagnosis not present

## 2022-12-01 DIAGNOSIS — M069 Rheumatoid arthritis, unspecified: Secondary | ICD-10-CM | POA: Diagnosis not present

## 2022-12-01 DIAGNOSIS — Z1371 Encounter for nonprocreative screening for genetic disease carrier status: Secondary | ICD-10-CM | POA: Diagnosis not present

## 2022-12-01 DIAGNOSIS — F32A Depression, unspecified: Secondary | ICD-10-CM | POA: Diagnosis not present

## 2022-12-01 DIAGNOSIS — D849 Immunodeficiency, unspecified: Secondary | ICD-10-CM | POA: Diagnosis not present

## 2022-12-05 ENCOUNTER — Telehealth: Payer: Self-pay | Admitting: Adult Health

## 2022-12-05 NOTE — Telephone Encounter (Signed)
Heidi [name] with [employer] [phone]  Called regarding the: Rx for UV one device -   A fax was sent on 11/20/22 and she says they are still waiting for NP to sign & return approval request.

## 2022-12-05 NOTE — Telephone Encounter (Addendum)
Tried to call number back no answer. We have been trying to reach someone regarding Rx. Provider declined 2 weeks ago. Representative notified of update.

## 2022-12-06 NOTE — Telephone Encounter (Signed)
Noted  

## 2022-12-06 NOTE — Telephone Encounter (Signed)
[name] is aware NP denied the below request

## 2022-12-06 NOTE — Telephone Encounter (Signed)
NP has denied this request.  As per NP,  Pt does not need it,  and NP feels this company is fraudulent.

## 2022-12-06 NOTE — Telephone Encounter (Signed)
[name] with [employer]  Is also requesting last office notes  Alternate Fax Number   [phone]

## 2022-12-12 DIAGNOSIS — Z820 Family history of epilepsy and other diseases of the nervous system: Secondary | ICD-10-CM | POA: Diagnosis not present

## 2022-12-12 DIAGNOSIS — Z1379 Encounter for other screening for genetic and chromosomal anomalies: Secondary | ICD-10-CM | POA: Diagnosis not present

## 2022-12-12 DIAGNOSIS — F32A Depression, unspecified: Secondary | ICD-10-CM | POA: Diagnosis not present

## 2022-12-12 DIAGNOSIS — F419 Anxiety disorder, unspecified: Secondary | ICD-10-CM | POA: Diagnosis not present

## 2022-12-25 ENCOUNTER — Other Ambulatory Visit: Payer: Self-pay | Admitting: Adult Health

## 2022-12-25 DIAGNOSIS — R109 Unspecified abdominal pain: Secondary | ICD-10-CM

## 2022-12-25 DIAGNOSIS — R630 Anorexia: Secondary | ICD-10-CM

## 2023-01-10 ENCOUNTER — Other Ambulatory Visit: Payer: Self-pay | Admitting: Adult Health

## 2023-01-10 DIAGNOSIS — G4700 Insomnia, unspecified: Secondary | ICD-10-CM

## 2023-01-10 NOTE — Telephone Encounter (Signed)
Okay for refill?  

## 2023-01-18 ENCOUNTER — Other Ambulatory Visit (HOSPITAL_COMMUNITY): Payer: Self-pay

## 2023-03-16 ENCOUNTER — Other Ambulatory Visit: Payer: Self-pay | Admitting: Adult Health

## 2023-03-16 DIAGNOSIS — F39 Unspecified mood [affective] disorder: Secondary | ICD-10-CM

## 2023-04-09 ENCOUNTER — Other Ambulatory Visit: Payer: Self-pay | Admitting: Adult Health

## 2023-04-09 DIAGNOSIS — R630 Anorexia: Secondary | ICD-10-CM

## 2023-04-09 DIAGNOSIS — G4700 Insomnia, unspecified: Secondary | ICD-10-CM

## 2023-04-09 DIAGNOSIS — R109 Unspecified abdominal pain: Secondary | ICD-10-CM

## 2023-04-10 NOTE — Telephone Encounter (Signed)
Okay for refill?  

## 2023-06-05 ENCOUNTER — Other Ambulatory Visit: Payer: Self-pay | Admitting: Adult Health

## 2023-06-05 DIAGNOSIS — M810 Age-related osteoporosis without current pathological fracture: Secondary | ICD-10-CM

## 2023-06-05 DIAGNOSIS — E039 Hypothyroidism, unspecified: Secondary | ICD-10-CM

## 2023-06-11 ENCOUNTER — Other Ambulatory Visit: Payer: Self-pay | Admitting: Adult Health

## 2023-06-11 DIAGNOSIS — R630 Anorexia: Secondary | ICD-10-CM

## 2023-06-11 DIAGNOSIS — R109 Unspecified abdominal pain: Secondary | ICD-10-CM

## 2023-06-12 ENCOUNTER — Ambulatory Visit (HOSPITAL_BASED_OUTPATIENT_CLINIC_OR_DEPARTMENT_OTHER)
Admission: EM | Admit: 2023-06-12 | Discharge: 2023-06-12 | Disposition: A | Discharge status: Home / Self Care | Payer: Medicare Other | Attending: Physician Assistant | Admitting: Physician Assistant

## 2023-06-12 ENCOUNTER — Encounter (HOSPITAL_BASED_OUTPATIENT_CLINIC_OR_DEPARTMENT_OTHER): Payer: Self-pay | Admitting: Emergency Medicine

## 2023-06-12 DIAGNOSIS — M1990 Unspecified osteoarthritis, unspecified site: Secondary | ICD-10-CM | POA: Diagnosis not present

## 2023-06-12 DIAGNOSIS — F419 Anxiety disorder, unspecified: Secondary | ICD-10-CM | POA: Diagnosis not present

## 2023-06-12 DIAGNOSIS — E440 Moderate protein-calorie malnutrition: Secondary | ICD-10-CM | POA: Diagnosis not present

## 2023-06-12 DIAGNOSIS — I252 Old myocardial infarction: Secondary | ICD-10-CM | POA: Diagnosis not present

## 2023-06-12 DIAGNOSIS — A419 Sepsis, unspecified organism: Secondary | ICD-10-CM | POA: Diagnosis not present

## 2023-06-12 DIAGNOSIS — R9431 Abnormal electrocardiogram [ECG] [EKG]: Secondary | ICD-10-CM | POA: Diagnosis not present

## 2023-06-12 DIAGNOSIS — J441 Chronic obstructive pulmonary disease with (acute) exacerbation: Secondary | ICD-10-CM | POA: Diagnosis not present

## 2023-06-12 DIAGNOSIS — R41841 Cognitive communication deficit: Secondary | ICD-10-CM | POA: Diagnosis not present

## 2023-06-12 DIAGNOSIS — R531 Weakness: Secondary | ICD-10-CM | POA: Diagnosis not present

## 2023-06-12 DIAGNOSIS — E860 Dehydration: Secondary | ICD-10-CM | POA: Diagnosis not present

## 2023-06-12 DIAGNOSIS — E785 Hyperlipidemia, unspecified: Secondary | ICD-10-CM | POA: Diagnosis not present

## 2023-06-12 DIAGNOSIS — F32A Depression, unspecified: Secondary | ICD-10-CM | POA: Diagnosis not present

## 2023-06-12 DIAGNOSIS — E871 Hypo-osmolality and hyponatremia: Secondary | ICD-10-CM | POA: Diagnosis present

## 2023-06-12 DIAGNOSIS — J449 Chronic obstructive pulmonary disease, unspecified: Secondary | ICD-10-CM | POA: Diagnosis not present

## 2023-06-12 DIAGNOSIS — J9621 Acute and chronic respiratory failure with hypoxia: Secondary | ICD-10-CM | POA: Diagnosis not present

## 2023-06-12 DIAGNOSIS — E039 Hypothyroidism, unspecified: Secondary | ICD-10-CM | POA: Diagnosis not present

## 2023-06-12 DIAGNOSIS — Z9981 Dependence on supplemental oxygen: Secondary | ICD-10-CM | POA: Diagnosis not present

## 2023-06-12 DIAGNOSIS — Z681 Body mass index (BMI) 19 or less, adult: Secondary | ICD-10-CM | POA: Diagnosis not present

## 2023-06-12 DIAGNOSIS — J189 Pneumonia, unspecified organism: Secondary | ICD-10-CM | POA: Diagnosis not present

## 2023-06-12 DIAGNOSIS — R0902 Hypoxemia: Secondary | ICD-10-CM | POA: Diagnosis not present

## 2023-06-12 DIAGNOSIS — R6520 Severe sepsis without septic shock: Secondary | ICD-10-CM | POA: Diagnosis not present

## 2023-06-12 DIAGNOSIS — R7989 Other specified abnormal findings of blood chemistry: Secondary | ICD-10-CM | POA: Diagnosis not present

## 2023-06-12 DIAGNOSIS — I444 Left anterior fascicular block: Secondary | ICD-10-CM | POA: Diagnosis not present

## 2023-06-12 DIAGNOSIS — J1282 Pneumonia due to coronavirus disease 2019: Secondary | ICD-10-CM | POA: Diagnosis not present

## 2023-06-12 DIAGNOSIS — E7800 Pure hypercholesterolemia, unspecified: Secondary | ICD-10-CM | POA: Diagnosis not present

## 2023-06-12 DIAGNOSIS — Z79899 Other long term (current) drug therapy: Secondary | ICD-10-CM | POA: Diagnosis not present

## 2023-06-12 DIAGNOSIS — E876 Hypokalemia: Secondary | ICD-10-CM | POA: Diagnosis not present

## 2023-06-12 DIAGNOSIS — R000 Tachycardia, unspecified: Secondary | ICD-10-CM | POA: Diagnosis not present

## 2023-06-12 DIAGNOSIS — U071 COVID-19: Secondary | ICD-10-CM | POA: Diagnosis not present

## 2023-06-12 DIAGNOSIS — R338 Other retention of urine: Secondary | ICD-10-CM | POA: Diagnosis not present

## 2023-06-12 DIAGNOSIS — A4189 Other specified sepsis: Secondary | ICD-10-CM | POA: Diagnosis not present

## 2023-06-12 DIAGNOSIS — R0602 Shortness of breath: Secondary | ICD-10-CM | POA: Diagnosis not present

## 2023-06-12 DIAGNOSIS — M1380 Other specified arthritis, unspecified site: Secondary | ICD-10-CM | POA: Diagnosis not present

## 2023-06-12 DIAGNOSIS — J9610 Chronic respiratory failure, unspecified whether with hypoxia or hypercapnia: Secondary | ICD-10-CM | POA: Diagnosis not present

## 2023-06-12 DIAGNOSIS — R509 Fever, unspecified: Secondary | ICD-10-CM | POA: Diagnosis not present

## 2023-06-12 DIAGNOSIS — J440 Chronic obstructive pulmonary disease with acute lower respiratory infection: Secondary | ICD-10-CM | POA: Diagnosis not present

## 2023-06-12 DIAGNOSIS — Z87891 Personal history of nicotine dependence: Secondary | ICD-10-CM | POA: Diagnosis not present

## 2023-06-12 DIAGNOSIS — Z880 Allergy status to penicillin: Secondary | ICD-10-CM | POA: Diagnosis not present

## 2023-06-12 DIAGNOSIS — R5381 Other malaise: Secondary | ICD-10-CM | POA: Diagnosis not present

## 2023-06-12 DIAGNOSIS — Z743 Need for continuous supervision: Secondary | ICD-10-CM | POA: Diagnosis not present

## 2023-06-12 DIAGNOSIS — M6259 Muscle wasting and atrophy, not elsewhere classified, multiple sites: Secondary | ICD-10-CM | POA: Diagnosis not present

## 2023-06-12 DIAGNOSIS — G4700 Insomnia, unspecified: Secondary | ICD-10-CM | POA: Diagnosis not present

## 2023-06-12 MED ORDER — ALBUTEROL SULFATE (2.5 MG/3ML) 0.083% IN NEBU
2.5000 mg | INHALATION_SOLUTION | Freq: Once | RESPIRATORY_TRACT | Status: AC
  Administered 2023-06-12: 2.5 mg via RESPIRATORY_TRACT

## 2023-06-12 NOTE — ED Notes (Signed)
Pt O2 saturation 69%, patient's portable Oxygen machine not working properly. Pt placed on 2L O2 via our oxygen and nasal canula. Pt O2 coming up in 70s%. Angie, PA made aware.

## 2023-06-12 NOTE — ED Notes (Signed)
EMS made aware of transport needed to ED.

## 2023-06-12 NOTE — ED Triage Notes (Signed)
Since last Tuesday patient had sore throat and cough. Used throat lozenges. Pt leaves alone. Caregiver saw her on Monday and patient wasn't feeling well at all. Took Vick's vapor rub and patches. Today caregiver called patient to check on her and she states she wasn't feeling good at all. Pt has cough that is productive-getting up yellow-brown phlegm.  Pt on home O2 of 2L via nasal canula.  Pt taking Mucinex DM.  Pt c/o fatigue.

## 2023-06-12 NOTE — ED Notes (Signed)
Pt O2 saturation 91% during neb treatment.

## 2023-06-12 NOTE — ED Notes (Signed)
Patient is being discharged from the Urgent Care and sent to the Emergency Department via [facility]. Per Flint Melter, PA, patient is in need of higher level of care due to shortness of breath, hypoxia, tachycardia. Patient is aware and verbalizes understanding of plan of care.  Vitals:   06/12/23 1153 06/12/23 1204  BP: (!) 148/84   Pulse: (!) 116   Resp: (!) 36   Temp: 99.2 F (37.3 C)   SpO2: (!) 81% (!) 87%

## 2023-06-12 NOTE — ED Notes (Signed)
EMS at bedside. Report given from [facility] staff.

## 2023-06-12 NOTE — ED Provider Notes (Signed)
[facility]    CSN: [id number] Arrival date & time: 06/12/23  1135      History   Chief Complaint No chief complaint on file.   HPI Heidi [name] is a 82 y.o. female.   The history is provided by the patient and a relative.   Not feeling well since yesterday has had a cough for 1 week developed fever to 102 yesterday associated with decreased appetite and 1 episode of vomiting and some shortness of breath.  Admits coughing up yellow to brown sputum, nasal congestion, wheezing. Denies rhinorrhea, sore throat, headache.   Family member reports mild confusion today. Past medical history reviewed.  Has COPD did not use her inhalers or nebulizer treatments today . patient is a former smoker. RN reports patient oxygen was not working upon arrival.  Past Medical History:  Diagnosis Date   Arthritis    Cerebrovascular disease    Chronic diastolic CHF (congestive heart failure) (HCC) 02/23/2016   COPD (chronic obstructive pulmonary disease) (HCC)    Depression    Diverticulosis    Esophageal stricture    Generalized anxiety disorder    History of pneumonia    History of stroke    mild oncoming   Hx of adenomatous colonic polyps    with high grade dysplasia   Hyperlipidemia    Hypothyroidism    Internal hemorrhoids    Osteoporosis    Pneumonia    HX of   Positive ANA (antinuclear antibody)    Pulmonary nodule    Rectal ulcer    Shortness of breath    periodically with COPD flair up   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[facility])     Patient Active Problem List   Diagnosis Date Noted   Fall with injury 02/28/2017   Cerebrovascular disease 11/13/2016   Generalized anxiety disorder 05/11/2016   Abnormal CT of the chest 04/10/2016   Chronic diastolic CHF (congestive heart failure) (HCC) 02/23/2016   Low back pain 02/23/2016   COPD mixed type (HCC) 01/10/2016   Chronic hypoxemic respiratory failure (HCC) 01/10/2016   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   Elevated  troponin 04/27/2015   Pleural effusion 04/24/2015   DNR (do not resuscitate) 04/23/2015   Protein-calorie malnutrition, severe 04/22/2015   COPD exacerbation (HCC) 04/19/2015   Loss of weight 12/15/2014   PSORIASIS 12/28/2009   Depression 08/20/2009   Hyperlipidemia 08/04/2008   CAROTID BRUIT, LEFT 06/11/2008   Insomnia 01/08/2008   History of colonic polyps 06/10/2007   Hypothyroidism 02/04/2007    Past Surgical History:  Procedure Laterality Date   BLADDER SURGERY     tack   CATARACT EXTRACTION     bilateral   COLONOSCOPY  06/22/2011   Procedure: COLONOSCOPY;  Surgeon: Hart Carwin, MD;  Location: [facility];  Service: Endoscopy;  Laterality: N/A;   GASTRIC VARICES BANDING  06/22/2011   Procedure: HEMORRHOID BANDING;  Surgeon: Hart Carwin, MD;  Location: [facility] END[facility];  Service: Endoscopy;  Laterality: N/A;   HEMICOLECTOMY  2012   emergent surgery [facility]   HIATAL HERNIA REPAIR     x 2   TONSILLECTOMY     TOTAL ABDOMINAL HYSTERECTOMY W/ BILATERAL SALPINGOOPHORECTOMY  1998    OB History   No obstetric history on file.      Home Medications    Prior to Admission medications   Medication Sig Start Date End Date Taking? Authorizing Provider  albuterol (VENTOLIN HFA) 108 (90 Base) MCG/ACT inhaler Inhale 2 puffs into the lungs  every 6 (six) hours as needed for wheezing or shortness of breath. 08/12/19   Luciano Cutter, MD  alendronate (FOSAMAX) 70 MG tablet TAKE 1 TABLET ONCE A WEEK WITH A FULL GLASS OF WATER ON AN EMPTY STOMACH 06/06/23   Nafziger, Kandee Keen, NP  escitalopram (LEXAPRO) 10 MG tablet TAKE 1 TABLET BY MOUTH EVERY DAY 03/16/23   Nafziger, Kandee Keen, NP  gabapentin (NEURONTIN) 300 MG capsule TAKE 1 CAPSULE BY MOUTH TWICE A DAY 01/10/23   Nafziger, Kandee Keen, NP  ipratropium-albuterol (DUONEB) 0.5-2.5 (3) MG/3ML SOLN Take 3 mLs by nebulization 3 (three) times daily. 04/22/19   Nafziger, Kandee Keen, NP  levothyroxine (SYNTHROID) 75 MCG tablet TAKE ONE (1) TABLET ONCE  DAILY BEFORE BREAKFAST 06/06/23   Nafziger, Kandee Keen, NP  methylPREDNISolone (MEDROL) 4 MG tablet TAKE 1 TABLET BY MOUTH EVERY DAY 10/06/22   Nafziger, Kandee Keen, NP  naproxen sodium (ALEVE) 220 MG tablet Take 220 mg by mouth daily as needed.    [provider]  omeprazole (PRILOSEC) 20 MG capsule TAKE 1 CAPSULE (20 MG TOTAL) BY MOUTH DAILY. PLEASE SCHEDULE PHYSICAL EXAM 04/10/23   Nafziger, Kandee Keen, NP  pseudoephedrine-guaifenesin (MUCINEX D) 60-600 MG 12 hr tablet Take 1 tablet by mouth 2 (two) times daily as needed for congestion.    [provider]  simvastatin (ZOCOR) 20 MG tablet TAKE 1 TABLET BY MOUTH EVERYDAY AT BEDTIME 04/10/23   Nafziger, Kandee Keen, NP  temazepam (RESTORIL) 15 MG capsule TAKE 1 CAPSULE BY MOUTH AT BEDTIME AS NEEDED FOR SLEEP 04/10/23   Nafziger, Kandee Keen, NP  TRELEGY ELLIPTA 100-62.5-25 MCG/ACT AEPB TAKE 1 PUFF BY MOUTH EVERY DAY 04/10/23   Nafziger, Kandee Keen, NP    Family History Family History  Problem Relation Age of Onset   Colon cancer Mother    COPD Father    Heart attack Father    Stroke Maternal Aunt    Colon polyps Brother    Liver disease Maternal Aunt    Breast cancer Maternal Aunt    Hypertension Son    Hypothyroidism Daughter     Social History Social History   Tobacco Use   Smoking status: Former    Current packs/day: 0.00    Average packs/day: 2.5 packs/day for 50.0 years (125.0 ttl pk-yrs)    Types: Cigarettes    Start date: 11/02/1959    Quit date: 11/01/2009    Years since quitting: 13.6   Smokeless tobacco: Never  Vaping Use   Vaping status: Never Used  Substance Use Topics   Alcohol use: No   Drug use: No     Allergies   Morphine and codeine and Penicillins   Review of Systems Review of Systems  Constitutional:  Positive for chills, fatigue and fever.  HENT:  Positive for congestion. Negative for sore throat.   Respiratory:  Positive for cough, shortness of breath and wheezing.   Cardiovascular:  Positive for chest pain.   Gastrointestinal:  Positive for nausea and vomiting. Negative for abdominal pain and diarrhea.     Physical Exam Triage Vital Signs ED Triage Vitals  Encounter Vitals Group     BP      Systolic BP Percentile      Diastolic BP Percentile      Pulse      Resp      Temp      Temp src      SpO2      Weight      Height      Head Circumference  Peak Flow      Pain Score      Pain Loc      Pain Education      Exclude from Growth Chart    No data found.  Updated Vital Signs There were no vitals taken for this visit.  Visual Acuity Right Eye Distance:   Left Eye Distance:   Bilateral Distance:    Right Eye Near:   Left Eye Near:    Bilateral Near:     Physical Exam Vitals reviewed.  Constitutional:      Appearance: She is ill-appearing.     Comments: Thin, frail, tachypneic  HENT:     Nose: No rhinorrhea.  Eyes:     Comments: Left lower lid ectropion drainage on lashes  Cardiovascular:     Rate and Rhythm: Tachycardia present.     Heart sounds: Murmur heard.  Pulmonary:     Breath sounds: Rhonchi and rales present.     Comments: Increased respiratory rate and effort Skin:    General: Skin is warm.  Psychiatric:        Behavior: Behavior normal.      UC Treatments / Results  Labs (all labs ordered are listed, but only abnormal results are displayed) Labs Reviewed - No data to display  EKG   Radiology No results found.  Procedures Procedures (including critical care time)  Medications Ordered in UC Medications - No data to display  Initial Impression / Assessment and Plan / UC Course  I have reviewed the triage vital signs and the nursing notes.  Pertinent labs & imaging results that were available during my care of the patient were reviewed by me and considered in my medical decision making (see chart for details).     Frail 82 year old female with mild respiratory distress tachycardia, tachypnea and hypoxia, she has bilateral rales on  exam concern, reports cough, fever, fatigue, shortness of breath and chest pain.  Concern for pneumonia.  Albuterol neb was given, EMS was called.  Oxygen saturation improved to 87% after neb treatment but patient remained tachypneic Patient reports central chest pain, EKG was obtained sinus tachycardia with left axis deviation no acute or ischemic changes, change from EKG dated 04/21/2015 Final Clinical Impressions(s) / UC Diagnoses   Final diagnoses:  None   Discharge Instructions   None    ED Prescriptions   None    PDMP not reviewed this encounter.   Meliton Rattan, [place] 06/12/23 1225

## 2023-06-12 NOTE — Discharge Instructions (Addendum)
Go to the emergency department

## 2023-06-23 DIAGNOSIS — E440 Moderate protein-calorie malnutrition: Secondary | ICD-10-CM | POA: Diagnosis not present

## 2023-06-23 DIAGNOSIS — J441 Chronic obstructive pulmonary disease with (acute) exacerbation: Secondary | ICD-10-CM | POA: Diagnosis not present

## 2023-06-23 DIAGNOSIS — F419 Anxiety disorder, unspecified: Secondary | ICD-10-CM | POA: Diagnosis not present

## 2023-06-23 DIAGNOSIS — M1380 Other specified arthritis, unspecified site: Secondary | ICD-10-CM | POA: Diagnosis not present

## 2023-06-23 DIAGNOSIS — Z743 Need for continuous supervision: Secondary | ICD-10-CM | POA: Diagnosis not present

## 2023-06-23 DIAGNOSIS — G4700 Insomnia, unspecified: Secondary | ICD-10-CM | POA: Diagnosis not present

## 2023-06-23 DIAGNOSIS — R5381 Other malaise: Secondary | ICD-10-CM | POA: Diagnosis not present

## 2023-06-23 DIAGNOSIS — A4189 Other specified sepsis: Secondary | ICD-10-CM | POA: Diagnosis not present

## 2023-06-23 DIAGNOSIS — R338 Other retention of urine: Secondary | ICD-10-CM | POA: Diagnosis not present

## 2023-06-23 DIAGNOSIS — E039 Hypothyroidism, unspecified: Secondary | ICD-10-CM | POA: Diagnosis not present

## 2023-06-23 DIAGNOSIS — R531 Weakness: Secondary | ICD-10-CM | POA: Diagnosis not present

## 2023-06-23 DIAGNOSIS — R41841 Cognitive communication deficit: Secondary | ICD-10-CM | POA: Diagnosis not present

## 2023-06-23 DIAGNOSIS — E871 Hypo-osmolality and hyponatremia: Secondary | ICD-10-CM | POA: Diagnosis not present

## 2023-06-23 DIAGNOSIS — J449 Chronic obstructive pulmonary disease, unspecified: Secondary | ICD-10-CM | POA: Diagnosis not present

## 2023-06-23 DIAGNOSIS — F32A Depression, unspecified: Secondary | ICD-10-CM | POA: Diagnosis not present

## 2023-06-23 DIAGNOSIS — J9621 Acute and chronic respiratory failure with hypoxia: Secondary | ICD-10-CM | POA: Diagnosis not present

## 2023-06-23 DIAGNOSIS — R062 Wheezing: Secondary | ICD-10-CM | POA: Diagnosis not present

## 2023-06-23 DIAGNOSIS — J9610 Chronic respiratory failure, unspecified whether with hypoxia or hypercapnia: Secondary | ICD-10-CM | POA: Diagnosis not present

## 2023-06-23 DIAGNOSIS — E785 Hyperlipidemia, unspecified: Secondary | ICD-10-CM | POA: Diagnosis not present

## 2023-06-23 DIAGNOSIS — M6259 Muscle wasting and atrophy, not elsewhere classified, multiple sites: Secondary | ICD-10-CM | POA: Diagnosis not present

## 2023-06-23 DIAGNOSIS — U071 COVID-19: Secondary | ICD-10-CM | POA: Diagnosis not present

## 2023-06-25 DIAGNOSIS — E440 Moderate protein-calorie malnutrition: Secondary | ICD-10-CM | POA: Diagnosis not present

## 2023-06-25 DIAGNOSIS — E871 Hypo-osmolality and hyponatremia: Secondary | ICD-10-CM | POA: Diagnosis not present

## 2023-06-25 DIAGNOSIS — J9610 Chronic respiratory failure, unspecified whether with hypoxia or hypercapnia: Secondary | ICD-10-CM | POA: Diagnosis not present

## 2023-06-25 DIAGNOSIS — J449 Chronic obstructive pulmonary disease, unspecified: Secondary | ICD-10-CM | POA: Diagnosis not present

## 2023-06-27 DIAGNOSIS — E440 Moderate protein-calorie malnutrition: Secondary | ICD-10-CM | POA: Diagnosis not present

## 2023-06-27 DIAGNOSIS — E785 Hyperlipidemia, unspecified: Secondary | ICD-10-CM | POA: Diagnosis not present

## 2023-06-27 DIAGNOSIS — J9610 Chronic respiratory failure, unspecified whether with hypoxia or hypercapnia: Secondary | ICD-10-CM | POA: Diagnosis not present

## 2023-06-27 DIAGNOSIS — J449 Chronic obstructive pulmonary disease, unspecified: Secondary | ICD-10-CM | POA: Diagnosis not present

## 2023-07-04 ENCOUNTER — Encounter: Payer: Medicare Other | Admitting: Adult Health

## 2023-07-04 NOTE — Progress Notes (Deleted)
Subjective:    Patient ID: Heidi [name], female    DOB: 07-22-1940, 82 y.o.   MRN: [id number]  HPI Patient presents for yearly preventative medicine examination. She is a pleasant 82 year old female who  has a past medical history of Arthritis, Cerebrovascular disease, Chronic diastolic CHF (congestive heart failure) (HCC) (02/23/2016), COPD (chronic obstructive pulmonary disease) (HCC), Depression, Diverticulosis, Esophageal stricture, Generalized anxiety disorder, History of pneumonia, History of stroke, adenomatous colonic polyps, Hyperlipidemia, Hypothyroidism, Internal hemorrhoids, Osteoporosis, Pneumonia, Positive ANA (antinuclear antibody), Pulmonary nodule, Rectal ulcer, Shortness of breath, and 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(HCC).  She continues to live by herself.  Her daughter-in-law Harriett Sine keeps a close eye on her and visits her almost every day.  She also has a small [place] that she cares for and enjoys the company  COPD -end-stage COPD.  He is no longer being seen by pulmonary, last time was in November 2020.  She is currently managed with allergy Ellipta 100-60 2.5-25 mg daily, chronic steroid therapy Medrol 4 mg daily, Mucinex, albuterol inhaler as needed and DuoNeb TID.  Continues to have baseline dyspnea on exertion but does not feel as though she needs to use her as needed nebulizer or rescue inhaler. Is on continuous oxygen therapy at 2-3 L via [place]   Hypothyroidism -managed with Synthroid 75 mcg daily  Insomnia-uses Restoril 15 mg nightly.  She feels as though she gets good sleep on this medication  Mood disorder/anxiety-currently managed with Lexapro 10 mg daily.  hyperlipidemia-takes Zocor 10 mg daily.  She denies myalgia or fatigue Lab Results  Component Value Date   CHOL 176 05/31/2022   HDL 86.90 05/31/2022   LDLCALC 73 05/31/2022   LDLDIRECT 340.7 10/14/2012   TRIG 81.0 05/31/2022   CHOLHDL 2 05/31/2022    Osteoporosis-managed with Fosamax 70 mg every week.  She denies any  recent falls or fractures.  She is overdue for bone density screening, last was in 2016 showed a T score of -4.8   All immunizations and health maintenance protocols were reviewed with the patient and needed orders were placed.  Appropriate screening laboratory values were ordered for the patient including screening of hyperlipidemia, renal function and hepatic function.  Medication reconciliation,  past medical history, social history, problem list and allergies were reviewed in detail with the patient  Goals were established with regard to weight loss, exercise, and  diet in compliance with medications. She is sedentary due to COPD.  Wt Readings from Last 3 Encounters:  05/31/22 97 lb (44 kg)  05/31/22 97 lb 14.4 oz (44.4 kg)  02/14/22 104 lb (47.2 kg)    Review of Systems  Constitutional: Negative.   HENT: Negative.    Eyes: Negative.   Respiratory: Negative.    Cardiovascular: Negative.   Gastrointestinal: Negative.   Endocrine: Negative.   Genitourinary: Negative.   Musculoskeletal: Negative.   Skin: Negative.   Allergic/Immunologic: Negative.   Neurological: Negative.   Hematological: Negative.   Psychiatric/Behavioral: Negative.     Past Medical History:  Diagnosis Date   Arthritis    Cerebrovascular disease    Chronic diastolic CHF (congestive heart failure) (HCC) 02/23/2016   COPD (chronic obstructive pulmonary disease) (HCC)    Depression    Diverticulosis    Esophageal stricture    Generalized anxiety disorder    History of pneumonia    History of stroke    mild oncoming   Hx of adenomatous colonic polyps    with high grade dysplasia  Hyperlipidemia    Hypothyroidism    Internal hemorrhoids    Osteoporosis    Pneumonia    HX of   Positive ANA (antinuclear antibody)    Pulmonary nodule    Rectal ulcer    Shortness of breath    periodically with COPD flair up   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(HCC)     Social History   Socioeconomic History   Marital status:  Widowed    Spouse name: Not on file   Number of children: 3   Years of education: Not on file   Highest education level: Not on file  Occupational History   Occupation: RETIRED    Employer: RETIRED  Tobacco Use   Smoking status: Former    Current packs/day: 0.00    Average packs/day: 2.5 packs/day for 50.0 years (125.0 ttl pk-yrs)    Types: Cigarettes    Start date: 11/02/1959    Quit date: 11/01/2009    Years since quitting: 13.6   Smokeless tobacco: Never  Vaping Use   Vaping status: Never Used  Substance and Sexual Activity   Alcohol use: No   Drug use: No   Sexual activity: Not Currently  Other Topics Concern   Not on file  Social History Narrative   Patient has no recent travel. She is smoking 2 packs per day and has smoked since age of 66. Has cats and dogs at home. No bird or mold exposure.       dtr with temporary    Social Drivers of Health   Financial Resource Strain: Low Risk  (05/31/2022)   Overall Financial Resource Strain (CARDIA)    Difficulty of Paying Living Expenses: Not hard at all  Food Insecurity: No Food Insecurity (05/31/2022)   Hunger Vital Sign    Worried About Running Out of Food in the Last Year: Never true    Ran Out of Food in the Last Year: Never true  Transportation Needs: No Transportation Needs (05/31/2022)   PRAPARE - Administrator, Civil Service (Medical): No    Lack of Transportation (Non-Medical): No  Physical Activity: Insufficiently Active (05/31/2022)   Exercise Vital Sign    Days of Exercise per Week: 7 days    Minutes of Exercise per Session: 10 min  Stress: No Stress Concern Present (05/31/2022)   [employer] of Occupational Health - Occupational Stress Questionnaire    Feeling of Stress : Not at all  Social Connections: Moderately Integrated (05/31/2022)   Social Connection and Isolation Panel [NHANES]    Frequency of Communication with Friends and Family: More than three times a week    Frequency of  Social Gatherings with Friends and Family: More than three times a week    Attends Religious Services: More than 4 times per year    Active Member of [employer] or Organizations: Yes    Attends Banker Meetings: More than 4 times per year    Marital Status: Widowed  Intimate Partner Violence: Not At Risk (05/31/2022)   Humiliation, Afraid, Rape, and Kick questionnaire    Fear of Current or Ex-Partner: No    Emotionally Abused: No    Physically Abused: No    Sexually Abused: No    Past Surgical History:  Procedure Laterality Date   BLADDER SURGERY     tack   CATARACT EXTRACTION     bilateral   COLONOSCOPY  06/22/2011   Procedure: COLONOSCOPY;  Surgeon: Hart Carwin, MD;  Location: [facility];  Service:  Endoscopy;  Laterality: N/A;   GASTRIC VARICES BANDING  06/22/2011   Procedure: HEMORRHOID BANDING;  Surgeon: Hart Carwin, MD;  Location: [facility];  Service: Endoscopy;  Laterality: N/A;   HEMICOLECTOMY  2012   emergent surgery [facility]   HIATAL HERNIA REPAIR     x 2   TONSILLECTOMY     TOTAL ABDOMINAL HYSTERECTOMY W/ BILATERAL SALPINGOOPHORECTOMY  1998    Family History  Problem Relation Age of Onset   Colon cancer Mother    COPD Father    Heart attack Father    Stroke Maternal Aunt    Colon polyps Brother    Liver disease Maternal Aunt    Breast cancer Maternal Aunt    Hypertension Son    Hypothyroidism Daughter     Allergies  Allergen Reactions   Morphine And Codeine Nausea And Vomiting   Penicillins Hives    Has patient had a PCN reaction causing immediate rash, facial/tongue/throat swelling, SOB or lightheadedness with hypotension: No Has patient had a PCN reaction causing severe rash involving mucus membranes or skin necrosis: No Has patient had a PCN reaction that required hospitalization No Has patient had a PCN reaction occurring within the last 10 years: No If all of the above answers are "NO", then may proceed with Cephalosporin use.     Current Outpatient Medications on File Prior to Visit  Medication Sig Dispense Refill   albuterol (VENTOLIN HFA) 108 (90 Base) MCG/ACT inhaler Inhale 2 puffs into the lungs every 6 (six) hours as needed for wheezing or shortness of breath. 8 g 2   alendronate (FOSAMAX) 70 MG tablet TAKE 1 TABLET ONCE A WEEK WITH A FULL GLASS OF WATER ON AN EMPTY STOMACH 12 tablet 3   escitalopram (LEXAPRO) 10 MG tablet TAKE 1 TABLET BY MOUTH EVERY DAY 90 tablet 1   gabapentin (NEURONTIN) 300 MG capsule TAKE 1 CAPSULE BY MOUTH TWICE A DAY 180 capsule 1   ipratropium-albuterol (DUONEB) 0.5-2.5 (3) MG/3ML SOLN Take 3 mLs by nebulization 3 (three) times daily. 120 mL 3   levothyroxine (SYNTHROID) 75 MCG tablet TAKE ONE (1) TABLET ONCE DAILY BEFORE BREAKFAST 90 tablet 2   methylPREDNISolone (MEDROL) 4 MG tablet TAKE 1 TABLET BY MOUTH EVERY DAY 90 tablet 2   naproxen sodium (ALEVE) 220 MG tablet Take 220 mg by mouth daily as needed.     omeprazole (PRILOSEC) 20 MG capsule TAKE 1 CAPSULE (20 MG TOTAL) BY MOUTH DAILY. PLEASE SCHEDULE PHYSICAL EXAM 90 capsule 0   pseudoephedrine-guaifenesin (MUCINEX D) 60-600 MG 12 hr tablet Take 1 tablet by mouth 2 (two) times daily as needed for congestion.     simvastatin (ZOCOR) 20 MG tablet TAKE 1 TABLET BY MOUTH EVERYDAY AT BEDTIME 90 tablet 0   temazepam (RESTORIL) 15 MG capsule TAKE 1 CAPSULE BY MOUTH AT BEDTIME AS NEEDED FOR SLEEP 30 capsule 2   TRELEGY ELLIPTA 100-62.5-25 MCG/ACT AEPB TAKE 1 PUFF BY MOUTH EVERY DAY 60 each 6   No current facility-administered medications on file prior to visit.    There were no vitals taken for this visit.      Objective:   Physical Exam Vitals and nursing note reviewed.  Constitutional:      General: She is not in acute distress.    Appearance: Normal appearance. She is not ill-appearing.  HENT:     Head: Normocephalic and atraumatic.     Right Ear: Tympanic membrane, ear canal and external ear normal. There is no impacted  cerumen.     Left Ear: Tympanic membrane, ear canal and external ear normal. There is no impacted cerumen.     Nose: Nose normal. No congestion or rhinorrhea.     Mouth/Throat:     Mouth: Mucous membranes are moist.     Pharynx: Oropharynx is clear.  Eyes:     Extraocular Movements: Extraocular movements intact.     Conjunctiva/sclera: Conjunctivae normal.     Pupils: Pupils are equal, round, and reactive to light.  Neck:     Vascular: No carotid bruit.  Cardiovascular:     Rate and Rhythm: Normal rate and regular rhythm.     Pulses: Normal pulses.     Heart sounds: No murmur heard.    No friction rub. No gallop.  Pulmonary:     Effort: Pulmonary effort is normal.     Breath sounds: Normal breath sounds.  Abdominal:     General: Abdomen is flat. Bowel sounds are normal. There is no distension.     Palpations: Abdomen is soft. There is no mass.     Tenderness: There is no abdominal tenderness. There is no guarding or rebound.     Hernia: No hernia is present.  Musculoskeletal:        General: Normal range of motion.     Cervical back: Normal range of motion and neck supple.  Lymphadenopathy:     Cervical: No cervical adenopathy.  Skin:    General: Skin is warm and dry.     Capillary Refill: Capillary refill takes less than 2 seconds.  Neurological:     General: No focal deficit present.     Mental Status: She is alert and oriented to person, place, and time.  Psychiatric:        Mood and Affect: Mood normal.        Behavior: Behavior normal.        Thought Content: Thought content normal.        Judgment: Judgment normal.           Assessment & Plan:

## 2023-07-06 DIAGNOSIS — J441 Chronic obstructive pulmonary disease with (acute) exacerbation: Secondary | ICD-10-CM | POA: Diagnosis not present

## 2023-07-06 DIAGNOSIS — J9610 Chronic respiratory failure, unspecified whether with hypoxia or hypercapnia: Secondary | ICD-10-CM | POA: Diagnosis not present

## 2023-07-19 DIAGNOSIS — E785 Hyperlipidemia, unspecified: Secondary | ICD-10-CM | POA: Diagnosis not present

## 2023-07-19 DIAGNOSIS — J9610 Chronic respiratory failure, unspecified whether with hypoxia or hypercapnia: Secondary | ICD-10-CM | POA: Diagnosis not present

## 2023-07-19 DIAGNOSIS — J449 Chronic obstructive pulmonary disease, unspecified: Secondary | ICD-10-CM | POA: Diagnosis not present

## 2023-07-19 DIAGNOSIS — E440 Moderate protein-calorie malnutrition: Secondary | ICD-10-CM | POA: Diagnosis not present

## 2023-07-21 DIAGNOSIS — Z8616 Personal history of COVID-19: Secondary | ICD-10-CM | POA: Diagnosis not present

## 2023-07-21 DIAGNOSIS — Z9981 Dependence on supplemental oxygen: Secondary | ICD-10-CM | POA: Diagnosis not present

## 2023-07-21 DIAGNOSIS — J9611 Chronic respiratory failure with hypoxia: Secondary | ICD-10-CM | POA: Diagnosis not present

## 2023-07-21 DIAGNOSIS — G4700 Insomnia, unspecified: Secondary | ICD-10-CM | POA: Diagnosis not present

## 2023-07-21 DIAGNOSIS — E785 Hyperlipidemia, unspecified: Secondary | ICD-10-CM | POA: Diagnosis not present

## 2023-07-21 DIAGNOSIS — R339 Retention of urine, unspecified: Secondary | ICD-10-CM | POA: Diagnosis not present

## 2023-07-21 DIAGNOSIS — F419 Anxiety disorder, unspecified: Secondary | ICD-10-CM | POA: Diagnosis not present

## 2023-07-21 DIAGNOSIS — Z8701 Personal history of pneumonia (recurrent): Secondary | ICD-10-CM | POA: Diagnosis not present

## 2023-07-21 DIAGNOSIS — J4489 Other specified chronic obstructive pulmonary disease: Secondary | ICD-10-CM | POA: Diagnosis not present

## 2023-07-21 DIAGNOSIS — E039 Hypothyroidism, unspecified: Secondary | ICD-10-CM | POA: Diagnosis not present

## 2023-07-21 DIAGNOSIS — M1990 Unspecified osteoarthritis, unspecified site: Secondary | ICD-10-CM | POA: Diagnosis not present

## 2023-07-21 DIAGNOSIS — E440 Moderate protein-calorie malnutrition: Secondary | ICD-10-CM | POA: Diagnosis not present

## 2023-07-21 DIAGNOSIS — F32A Depression, unspecified: Secondary | ICD-10-CM | POA: Diagnosis not present

## 2023-07-21 DIAGNOSIS — R41841 Cognitive communication deficit: Secondary | ICD-10-CM | POA: Diagnosis not present

## 2023-07-21 DIAGNOSIS — E871 Hypo-osmolality and hyponatremia: Secondary | ICD-10-CM | POA: Diagnosis not present

## 2023-07-21 DIAGNOSIS — J439 Emphysema, unspecified: Secondary | ICD-10-CM | POA: Diagnosis not present

## 2023-07-24 DIAGNOSIS — E440 Moderate protein-calorie malnutrition: Secondary | ICD-10-CM | POA: Diagnosis not present

## 2023-07-24 DIAGNOSIS — F419 Anxiety disorder, unspecified: Secondary | ICD-10-CM | POA: Diagnosis not present

## 2023-07-24 DIAGNOSIS — J4489 Other specified chronic obstructive pulmonary disease: Secondary | ICD-10-CM | POA: Diagnosis not present

## 2023-07-24 DIAGNOSIS — F32A Depression, unspecified: Secondary | ICD-10-CM | POA: Diagnosis not present

## 2023-07-24 DIAGNOSIS — J439 Emphysema, unspecified: Secondary | ICD-10-CM | POA: Diagnosis not present

## 2023-07-24 DIAGNOSIS — J9611 Chronic respiratory failure with hypoxia: Secondary | ICD-10-CM | POA: Diagnosis not present

## 2023-07-26 DIAGNOSIS — F32A Depression, unspecified: Secondary | ICD-10-CM | POA: Diagnosis not present

## 2023-07-26 DIAGNOSIS — F419 Anxiety disorder, unspecified: Secondary | ICD-10-CM | POA: Diagnosis not present

## 2023-07-26 DIAGNOSIS — E440 Moderate protein-calorie malnutrition: Secondary | ICD-10-CM | POA: Diagnosis not present

## 2023-07-26 DIAGNOSIS — J439 Emphysema, unspecified: Secondary | ICD-10-CM | POA: Diagnosis not present

## 2023-07-26 DIAGNOSIS — J4489 Other specified chronic obstructive pulmonary disease: Secondary | ICD-10-CM | POA: Diagnosis not present

## 2023-07-26 DIAGNOSIS — J9611 Chronic respiratory failure with hypoxia: Secondary | ICD-10-CM | POA: Diagnosis not present

## 2023-07-27 DIAGNOSIS — F419 Anxiety disorder, unspecified: Secondary | ICD-10-CM | POA: Diagnosis not present

## 2023-07-27 DIAGNOSIS — J439 Emphysema, unspecified: Secondary | ICD-10-CM | POA: Diagnosis not present

## 2023-07-27 DIAGNOSIS — J4489 Other specified chronic obstructive pulmonary disease: Secondary | ICD-10-CM | POA: Diagnosis not present

## 2023-07-27 DIAGNOSIS — F32A Depression, unspecified: Secondary | ICD-10-CM | POA: Diagnosis not present

## 2023-07-27 DIAGNOSIS — J9611 Chronic respiratory failure with hypoxia: Secondary | ICD-10-CM | POA: Diagnosis not present

## 2023-07-27 DIAGNOSIS — E440 Moderate protein-calorie malnutrition: Secondary | ICD-10-CM | POA: Diagnosis not present

## 2023-07-30 ENCOUNTER — Other Ambulatory Visit: Payer: Self-pay | Admitting: Adult Health

## 2023-07-30 DIAGNOSIS — J4489 Other specified chronic obstructive pulmonary disease: Secondary | ICD-10-CM | POA: Diagnosis not present

## 2023-07-30 DIAGNOSIS — F419 Anxiety disorder, unspecified: Secondary | ICD-10-CM | POA: Diagnosis not present

## 2023-07-30 DIAGNOSIS — F32A Depression, unspecified: Secondary | ICD-10-CM | POA: Diagnosis not present

## 2023-07-30 DIAGNOSIS — G4700 Insomnia, unspecified: Secondary | ICD-10-CM

## 2023-07-30 DIAGNOSIS — J439 Emphysema, unspecified: Secondary | ICD-10-CM | POA: Diagnosis not present

## 2023-07-30 DIAGNOSIS — E440 Moderate protein-calorie malnutrition: Secondary | ICD-10-CM | POA: Diagnosis not present

## 2023-07-30 DIAGNOSIS — J9611 Chronic respiratory failure with hypoxia: Secondary | ICD-10-CM | POA: Diagnosis not present

## 2023-08-01 DIAGNOSIS — J4489 Other specified chronic obstructive pulmonary disease: Secondary | ICD-10-CM | POA: Diagnosis not present

## 2023-08-01 DIAGNOSIS — F32A Depression, unspecified: Secondary | ICD-10-CM | POA: Diagnosis not present

## 2023-08-01 DIAGNOSIS — J9611 Chronic respiratory failure with hypoxia: Secondary | ICD-10-CM | POA: Diagnosis not present

## 2023-08-01 DIAGNOSIS — F419 Anxiety disorder, unspecified: Secondary | ICD-10-CM | POA: Diagnosis not present

## 2023-08-01 DIAGNOSIS — J439 Emphysema, unspecified: Secondary | ICD-10-CM | POA: Diagnosis not present

## 2023-08-01 DIAGNOSIS — E440 Moderate protein-calorie malnutrition: Secondary | ICD-10-CM | POA: Diagnosis not present

## 2023-08-01 NOTE — Telephone Encounter (Signed)
 Okay for refill?

## 2023-08-03 ENCOUNTER — Inpatient Hospital Stay: Payer: Medicare Other | Admitting: Adult Health

## 2023-08-03 DIAGNOSIS — J9611 Chronic respiratory failure with hypoxia: Secondary | ICD-10-CM | POA: Diagnosis not present

## 2023-08-03 DIAGNOSIS — F419 Anxiety disorder, unspecified: Secondary | ICD-10-CM | POA: Diagnosis not present

## 2023-08-03 DIAGNOSIS — J4489 Other specified chronic obstructive pulmonary disease: Secondary | ICD-10-CM | POA: Diagnosis not present

## 2023-08-03 DIAGNOSIS — F32A Depression, unspecified: Secondary | ICD-10-CM | POA: Diagnosis not present

## 2023-08-03 DIAGNOSIS — E440 Moderate protein-calorie malnutrition: Secondary | ICD-10-CM | POA: Diagnosis not present

## 2023-08-03 DIAGNOSIS — J439 Emphysema, unspecified: Secondary | ICD-10-CM | POA: Diagnosis not present

## 2023-08-06 ENCOUNTER — Telehealth: Payer: Self-pay

## 2023-08-06 NOTE — Telephone Encounter (Signed)
*  Primary  Pharmacy Patient Advocate Encounter  Received notification from [employer] that Prior Authorization for Temazepam 15MG  capsules  has been CANCELLED due to previous denial and not seen by provider since 05/2022

## 2023-08-08 ENCOUNTER — Telehealth: Payer: Self-pay | Admitting: Adult Health

## 2023-08-08 ENCOUNTER — Inpatient Hospital Stay: Payer: Medicare Other | Admitting: Adult Health

## 2023-08-08 DIAGNOSIS — F419 Anxiety disorder, unspecified: Secondary | ICD-10-CM | POA: Diagnosis not present

## 2023-08-08 DIAGNOSIS — J9611 Chronic respiratory failure with hypoxia: Secondary | ICD-10-CM | POA: Diagnosis not present

## 2023-08-08 DIAGNOSIS — J439 Emphysema, unspecified: Secondary | ICD-10-CM | POA: Diagnosis not present

## 2023-08-08 DIAGNOSIS — J4489 Other specified chronic obstructive pulmonary disease: Secondary | ICD-10-CM | POA: Diagnosis not present

## 2023-08-08 DIAGNOSIS — E440 Moderate protein-calorie malnutrition: Secondary | ICD-10-CM | POA: Diagnosis not present

## 2023-08-08 DIAGNOSIS — F32A Depression, unspecified: Secondary | ICD-10-CM | POA: Diagnosis not present

## 2023-08-08 NOTE — Telephone Encounter (Signed)
Copied from CRM [id number]. Topic: General - Other >> Aug 08, 2023 11:02 AM Almira Coaster wrote: Reason for CRM: Brett Canales from [facility] is calling regarding a fax that was sent to the office yesterday 08/08/2023 a standard written order oxygen.He would like confirmation if we received it, his call back number is [phone] available Monday - Friday 9am - 5pm Fax number is [phone].

## 2023-08-08 NOTE — Telephone Encounter (Signed)
Please advise if this comes from you

## 2023-08-13 ENCOUNTER — Telehealth: Payer: Self-pay | Admitting: Adult Health

## 2023-08-13 DIAGNOSIS — E440 Moderate protein-calorie malnutrition: Secondary | ICD-10-CM | POA: Diagnosis not present

## 2023-08-13 DIAGNOSIS — F419 Anxiety disorder, unspecified: Secondary | ICD-10-CM | POA: Diagnosis not present

## 2023-08-13 DIAGNOSIS — J4489 Other specified chronic obstructive pulmonary disease: Secondary | ICD-10-CM | POA: Diagnosis not present

## 2023-08-13 DIAGNOSIS — J9611 Chronic respiratory failure with hypoxia: Secondary | ICD-10-CM | POA: Diagnosis not present

## 2023-08-13 DIAGNOSIS — J439 Emphysema, unspecified: Secondary | ICD-10-CM | POA: Diagnosis not present

## 2023-08-13 DIAGNOSIS — F32A Depression, unspecified: Secondary | ICD-10-CM | POA: Diagnosis not present

## 2023-08-13 NOTE — Telephone Encounter (Signed)
Copied from CRM [id number]. Topic: General - Other >> Aug 10, 2023  3:57 PM Kathryne Eriksson wrote: Reason for CRM: Standard Written Order Oxygen

## 2023-08-13 NOTE — Telephone Encounter (Signed)
[facility] states patients oxygen equipment is due for replacement, in order to replace the equipment the written order has to be signed by the provider Charlott Rakes. They can either send a blank form back in order for the doctor to sign himself, or they can get the NPI number for [facility] "nurse practitioner" and allow for her to sign it. Call back number [phone]

## 2023-08-14 ENCOUNTER — Ambulatory Visit (INDEPENDENT_AMBULATORY_CARE_PROVIDER_SITE_OTHER): Payer: Medicare Other | Admitting: Adult Health

## 2023-08-14 ENCOUNTER — Encounter: Payer: Self-pay | Admitting: Adult Health

## 2023-08-14 ENCOUNTER — Ambulatory Visit: Payer: Medicare Other

## 2023-08-14 VITALS — BP 120/60 | HR 73 | Temp 98.2°F

## 2023-08-14 DIAGNOSIS — J189 Pneumonia, unspecified organism: Secondary | ICD-10-CM

## 2023-08-14 DIAGNOSIS — I771 Stricture of artery: Secondary | ICD-10-CM | POA: Diagnosis not present

## 2023-08-14 DIAGNOSIS — R918 Other nonspecific abnormal finding of lung field: Secondary | ICD-10-CM | POA: Diagnosis not present

## 2023-08-14 DIAGNOSIS — K449 Diaphragmatic hernia without obstruction or gangrene: Secondary | ICD-10-CM | POA: Diagnosis not present

## 2023-08-14 DIAGNOSIS — R600 Localized edema: Secondary | ICD-10-CM

## 2023-08-14 DIAGNOSIS — J9611 Chronic respiratory failure with hypoxia: Secondary | ICD-10-CM

## 2023-08-14 DIAGNOSIS — J449 Chronic obstructive pulmonary disease, unspecified: Secondary | ICD-10-CM

## 2023-08-14 LAB — BASIC METABOLIC PANEL
BUN: 11 mg/dL (ref 6–23)
CO2: 34 meq/L — ABNORMAL HIGH (ref 19–32)
Calcium: 8.8 mg/dL (ref 8.4–10.5)
Chloride: 98 meq/L (ref 96–112)
Creatinine, Ser: 0.69 mg/dL (ref 0.40–1.20)
GFR: 81.1 mL/min (ref >60.00)
Glucose, Bld: 88 mg/dL (ref 70–99)
Potassium: 4.3 meq/L (ref 3.5–5.1)
Sodium: 138 meq/L (ref 135–145)

## 2023-08-14 LAB — CBC
HCT: 27.7 % — ABNORMAL LOW (ref 36.0–46.0)
Hemoglobin: 8.5 g/dL — ABNORMAL LOW (ref 12.0–15.0)
MCHC: 30.9 g/dL (ref 30.0–36.0)
MCV: 86.7 fL (ref 78.0–100.0)
Platelets: 451 10*3/uL — ABNORMAL HIGH (ref 150.0–400.0)
RBC: 3.19 Mil/uL — ABNORMAL LOW (ref 3.87–5.11)
RDW: 18.8 % — ABNORMAL HIGH (ref 11.5–15.5)
WBC: 5.6 10*3/uL (ref 4.0–10.5)

## 2023-08-14 MED ORDER — FUROSEMIDE 20 MG PO TABS: 20.0000 mg | ORAL_TABLET | Freq: Every day | ORAL | 0 refills | Status: DC | PRN

## 2023-08-14 NOTE — Progress Notes (Signed)
Subjective:    Patient ID: Heidi [name], female    DOB: 1941/02/20, 83 y.o.   MRN: [id number]  HPI  83 year old female who  has a past medical history of Arthritis, Cerebrovascular disease, Chronic diastolic CHF (congestive heart failure) (HCC) (02/23/2016), COPD (chronic obstructive pulmonary disease) (HCC), Depression, Diverticulosis, Esophageal stricture, Generalized anxiety disorder, History of pneumonia, History of stroke, adenomatous colonic polyps, Hyperlipidemia, Hypothyroidism, Internal hemorrhoids, Osteoporosis, Pneumonia, Positive ANA (antinuclear antibody), Pulmonary nodule, Rectal ulcer, Shortness of breath, and 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(HCC).  She presents to the office today with her care giver Harriett Sine.   She was hospitalized at [facility] on 06/12/2023 and discharged on 07/12/2023 and then went to rehab and discharged form rehab around 07/20/2023 ( I do not have these notes)   She and her family reports that she was diagnosed with Covid 19 which led to pneumonia and respiratory distress from COPD. Since getting out of rehab she has been feeling much better and feels as though she is back to her baseline. She has home health PT and RN coming out of the house once a week.   She denies fevers, chills, cough, shortness of breath past baseline or wheezing. She has not had to increase her oxygen requirements.   Her biggest complaint today is that of lower extremity edema. She has been elevating her legs at rest but this does not seem to help. Legs become more swollen throughout the day. She has had this issue in the past and has tolerated lasix well.    Review of Systems See HPI   Past Medical History:  Diagnosis Date   Arthritis    Cerebrovascular disease    Chronic diastolic CHF (congestive heart failure) (HCC) 02/23/2016   COPD (chronic obstructive pulmonary disease) (HCC)    Depression    Diverticulosis    Esophageal stricture    Generalized anxiety disorder    History of  pneumonia    History of stroke    mild oncoming   Hx of adenomatous colonic polyps    with high grade dysplasia   Hyperlipidemia    Hypothyroidism    Internal hemorrhoids    Osteoporosis    Pneumonia    HX of   Positive ANA (antinuclear antibody)    Pulmonary nodule    Rectal ulcer    Shortness of breath    periodically with COPD flair up   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(HCC)     Social History   Socioeconomic History   Marital status: Widowed    Spouse name: Not on file   Number of children: 3   Years of education: Not on file   Highest education level: Not on file  Occupational History   Occupation: RETIRED    Employer: RETIRED  Tobacco Use   Smoking status: Former    Current packs/day: 0.00    Average packs/day: 2.5 packs/day for 50.0 years (125.0 ttl pk-yrs)    Types: Cigarettes    Start date: 11/02/1959    Quit date: 11/01/2009    Years since quitting: 13.7   Smokeless tobacco: Never  Vaping Use   Vaping status: Never Used  Substance and Sexual Activity   Alcohol use: No   Drug use: No   Sexual activity: Not Currently  Other Topics Concern   Not on file  Social History Narrative   Patient has no recent travel. She is smoking 2 packs per day and has smoked since age of 65. Has cats and dogs at  home. No bird or mold exposure.       dtr with temporary    Social Drivers of Health   Financial Resource Strain: Low Risk  (05/31/2022)   Overall Financial Resource Strain (CARDIA)    Difficulty of Paying Living Expenses: Not hard at all  Food Insecurity: No Food Insecurity (05/31/2022)   Hunger Vital Sign    Worried About Running Out of Food in the Last Year: Never true    Ran Out of Food in the Last Year: Never true  Transportation Needs: No Transportation Needs (05/31/2022)   PRAPARE - Administrator, Civil Service (Medical): No    Lack of Transportation (Non-Medical): No  Physical Activity: Insufficiently Active (05/31/2022)   Exercise Vital Sign    Days  of Exercise per Week: 7 days    Minutes of Exercise per Session: 10 min  Stress: No Stress Concern Present (05/31/2022)   [employer] of Occupational Health - Occupational Stress Questionnaire    Feeling of Stress : Not at all  Social Connections: Moderately Integrated (05/31/2022)   Social Connection and Isolation Panel [NHANES]    Frequency of Communication with Friends and Family: More than three times a week    Frequency of Social Gatherings with Friends and Family: More than three times a week    Attends Religious Services: More than 4 times per year    Active Member of [employer] or Organizations: Yes    Attends Banker Meetings: More than 4 times per year    Marital Status: Widowed  Intimate Partner Violence: Not At Risk (05/31/2022)   Humiliation, Afraid, Rape, and Kick questionnaire    Fear of Current or Ex-Partner: No    Emotionally Abused: No    Physically Abused: No    Sexually Abused: No    Past Surgical History:  Procedure Laterality Date   BLADDER SURGERY     tack   CATARACT EXTRACTION     bilateral   COLONOSCOPY  06/22/2011   Procedure: COLONOSCOPY;  Surgeon: Hart Carwin, MD;  Location: [facility];  Service: Endoscopy;  Laterality: N/A;   GASTRIC VARICES BANDING  06/22/2011   Procedure: HEMORRHOID BANDING;  Surgeon: Hart Carwin, MD;  Location: [facility];  Service: Endoscopy;  Laterality: N/A;   HEMICOLECTOMY  2012   emergent surgery [facility]   HIATAL HERNIA REPAIR     x 2   TONSILLECTOMY     TOTAL ABDOMINAL HYSTERECTOMY W/ BILATERAL SALPINGOOPHORECTOMY  1998    Family History  Problem Relation Age of Onset   Colon cancer Mother    COPD Father    Heart attack Father    Stroke Maternal Aunt    Colon polyps Brother    Liver disease Maternal Aunt    Breast cancer Maternal Aunt    Hypertension Son    Hypothyroidism Daughter     Allergies  Allergen Reactions   Morphine And Codeine Nausea And Vomiting   Penicillins Hives     Has patient had a PCN reaction causing immediate rash, facial/tongue/throat swelling, SOB or lightheadedness with hypotension: No Has patient had a PCN reaction causing severe rash involving mucus membranes or skin necrosis: No Has patient had a PCN reaction that required hospitalization No Has patient had a PCN reaction occurring within the last 10 years: No If all of the above answers are "NO", then may proceed with Cephalosporin use.    Current Outpatient Medications on File Prior to Visit  Medication Sig Dispense  Refill   albuterol (VENTOLIN HFA) 108 (90 Base) MCG/ACT inhaler Inhale 2 puffs into the lungs every 6 (six) hours as needed for wheezing or shortness of breath. 8 g 2   alendronate (FOSAMAX) 70 MG tablet TAKE 1 TABLET ONCE A WEEK WITH A FULL GLASS OF WATER ON AN EMPTY STOMACH 12 tablet 3   escitalopram (LEXAPRO) 10 MG tablet TAKE 1 TABLET BY MOUTH EVERY DAY 90 tablet 1   gabapentin (NEURONTIN) 300 MG capsule TAKE 1 CAPSULE BY MOUTH TWICE A DAY 180 capsule 1   ipratropium-albuterol (DUONEB) 0.5-2.5 (3) MG/3ML SOLN Take 3 mLs by nebulization 3 (three) times daily. 120 mL 3   levothyroxine (SYNTHROID) 75 MCG tablet TAKE ONE (1) TABLET ONCE DAILY BEFORE BREAKFAST 90 tablet 2   methylPREDNISolone (MEDROL) 4 MG tablet TAKE 1 TABLET BY MOUTH EVERY DAY 90 tablet 2   naproxen sodium (ALEVE) 220 MG tablet Take 220 mg by mouth daily as needed.     omeprazole (PRILOSEC) 20 MG capsule TAKE 1 CAPSULE (20 MG TOTAL) BY MOUTH DAILY. PLEASE SCHEDULE PHYSICAL EXAM 90 capsule 0   pseudoephedrine-guaifenesin (MUCINEX D) 60-600 MG 12 hr tablet Take 1 tablet by mouth 2 (two) times daily as needed for congestion.     simvastatin (ZOCOR) 20 MG tablet TAKE 1 TABLET BY MOUTH EVERYDAY AT BEDTIME 90 tablet 0   temazepam (RESTORIL) 15 MG capsule TAKE 1 CAPSULE BY MOUTH AT BEDTIME AS NEEDED FOR SLEEP 30 capsule 2   TRELEGY ELLIPTA 100-62.5-25 MCG/ACT AEPB TAKE 1 PUFF BY MOUTH EVERY DAY 60 each 6   No current  facility-administered medications on file prior to visit.    BP 120/60   Pulse 73   Temp 98.2 F (36.8 C) (Oral)   SpO2 99%       Objective:   Physical Exam Vitals and nursing note reviewed.  Constitutional:      Appearance: Normal appearance.  Cardiovascular:     Rate and Rhythm: Normal rate and regular rhythm.     Pulses: Normal pulses.     Heart sounds: Murmur heard.  Pulmonary:     Effort: Pulmonary effort is normal.     Breath sounds: Wheezing (trace) present.     Comments: On 3 L via [place] Musculoskeletal:     Right lower leg: 2+ Pitting Edema present.     Left lower leg: 2+ Pitting Edema present.  Skin:    General: Skin is warm and dry.  Neurological:     General: No focal deficit present.     Mental Status: She is alert and oriented to person, place, and time.  Psychiatric:        Mood and Affect: Mood normal.        Behavior: Behavior normal.        Thought Content: Thought content normal.        Judgment: Judgment normal.        Assessment & Plan:  1. Pneumonia due to infectious organism, unspecified laterality, unspecified part of lung (Primary) - Repeat chest xray today in the office.  - Follow up as eneded - DG Chest 2 View; Future - CBC; Future - Basic Metabolic Panel; Future  2. Chronic hypoxemic respiratory failure (HCC) - Continue with oxygen therapy - CBC; Future - Basic Metabolic Panel; Future  3. Lower extremity edema - Will send in short course of lasix  - CBC; Future - Basic Metabolic Panel; Future  4. COPD mixed type (HCC) - Continue with inhaler, nebulizer  and daily steroid therapy   Shirline Frees, NP

## 2023-08-14 NOTE — Telephone Encounter (Signed)
Spoke to representative and they stated they will fax over form.

## 2023-08-14 NOTE — Telephone Encounter (Signed)
Ok for order?

## 2023-08-14 NOTE — Patient Instructions (Signed)
It was great seeing you today   We will follow up with you regarding your lab work   Please let me know if you need anything

## 2023-08-16 DIAGNOSIS — F419 Anxiety disorder, unspecified: Secondary | ICD-10-CM | POA: Diagnosis not present

## 2023-08-16 DIAGNOSIS — F32A Depression, unspecified: Secondary | ICD-10-CM | POA: Diagnosis not present

## 2023-08-16 DIAGNOSIS — J4489 Other specified chronic obstructive pulmonary disease: Secondary | ICD-10-CM | POA: Diagnosis not present

## 2023-08-16 DIAGNOSIS — E440 Moderate protein-calorie malnutrition: Secondary | ICD-10-CM | POA: Diagnosis not present

## 2023-08-16 DIAGNOSIS — J439 Emphysema, unspecified: Secondary | ICD-10-CM | POA: Diagnosis not present

## 2023-08-16 DIAGNOSIS — J9611 Chronic respiratory failure with hypoxia: Secondary | ICD-10-CM | POA: Diagnosis not present

## 2023-08-17 NOTE — Telephone Encounter (Signed)
 Form has been faxed.

## 2023-08-17 NOTE — Telephone Encounter (Signed)
Called [facility] back bc a fax was never received. They will send over a new fax order.

## 2023-08-17 NOTE — Telephone Encounter (Signed)
 Fax confirmation received.

## 2023-08-20 DIAGNOSIS — M1990 Unspecified osteoarthritis, unspecified site: Secondary | ICD-10-CM | POA: Diagnosis not present

## 2023-08-20 DIAGNOSIS — F419 Anxiety disorder, unspecified: Secondary | ICD-10-CM | POA: Diagnosis not present

## 2023-08-20 DIAGNOSIS — J9611 Chronic respiratory failure with hypoxia: Secondary | ICD-10-CM | POA: Diagnosis not present

## 2023-08-20 DIAGNOSIS — F32A Depression, unspecified: Secondary | ICD-10-CM | POA: Diagnosis not present

## 2023-08-20 DIAGNOSIS — E440 Moderate protein-calorie malnutrition: Secondary | ICD-10-CM | POA: Diagnosis not present

## 2023-08-20 DIAGNOSIS — R339 Retention of urine, unspecified: Secondary | ICD-10-CM | POA: Diagnosis not present

## 2023-08-20 DIAGNOSIS — J439 Emphysema, unspecified: Secondary | ICD-10-CM | POA: Diagnosis not present

## 2023-08-20 DIAGNOSIS — G4700 Insomnia, unspecified: Secondary | ICD-10-CM | POA: Diagnosis not present

## 2023-08-20 DIAGNOSIS — E871 Hypo-osmolality and hyponatremia: Secondary | ICD-10-CM | POA: Diagnosis not present

## 2023-08-20 DIAGNOSIS — J4489 Other specified chronic obstructive pulmonary disease: Secondary | ICD-10-CM | POA: Diagnosis not present

## 2023-08-20 DIAGNOSIS — Z8616 Personal history of COVID-19: Secondary | ICD-10-CM | POA: Diagnosis not present

## 2023-08-20 DIAGNOSIS — R41841 Cognitive communication deficit: Secondary | ICD-10-CM | POA: Diagnosis not present

## 2023-08-20 DIAGNOSIS — Z9981 Dependence on supplemental oxygen: Secondary | ICD-10-CM | POA: Diagnosis not present

## 2023-08-20 DIAGNOSIS — E039 Hypothyroidism, unspecified: Secondary | ICD-10-CM | POA: Diagnosis not present

## 2023-08-20 DIAGNOSIS — E785 Hyperlipidemia, unspecified: Secondary | ICD-10-CM | POA: Diagnosis not present

## 2023-08-20 DIAGNOSIS — Z8701 Personal history of pneumonia (recurrent): Secondary | ICD-10-CM | POA: Diagnosis not present

## 2023-08-22 DIAGNOSIS — J4489 Other specified chronic obstructive pulmonary disease: Secondary | ICD-10-CM | POA: Diagnosis not present

## 2023-08-22 DIAGNOSIS — F419 Anxiety disorder, unspecified: Secondary | ICD-10-CM | POA: Diagnosis not present

## 2023-08-22 DIAGNOSIS — F32A Depression, unspecified: Secondary | ICD-10-CM | POA: Diagnosis not present

## 2023-08-22 DIAGNOSIS — J9611 Chronic respiratory failure with hypoxia: Secondary | ICD-10-CM | POA: Diagnosis not present

## 2023-08-22 DIAGNOSIS — E440 Moderate protein-calorie malnutrition: Secondary | ICD-10-CM | POA: Diagnosis not present

## 2023-08-22 DIAGNOSIS — J439 Emphysema, unspecified: Secondary | ICD-10-CM | POA: Diagnosis not present

## 2023-08-24 ENCOUNTER — Telehealth: Payer: Self-pay | Admitting: Adult Health

## 2023-08-24 NOTE — Telephone Encounter (Signed)
 Updated patients care giver Haskell Linker on chest xray results. Chest xray showed a 7mm nodular opacity. She will discuss with the patient on whether she wants to have CT scan done

## 2023-08-28 ENCOUNTER — Other Ambulatory Visit: Payer: Self-pay | Admitting: Adult Health

## 2023-08-29 DIAGNOSIS — F419 Anxiety disorder, unspecified: Secondary | ICD-10-CM | POA: Diagnosis not present

## 2023-08-29 DIAGNOSIS — J439 Emphysema, unspecified: Secondary | ICD-10-CM | POA: Diagnosis not present

## 2023-08-29 DIAGNOSIS — J9611 Chronic respiratory failure with hypoxia: Secondary | ICD-10-CM | POA: Diagnosis not present

## 2023-08-29 DIAGNOSIS — F32A Depression, unspecified: Secondary | ICD-10-CM | POA: Diagnosis not present

## 2023-08-29 DIAGNOSIS — J4489 Other specified chronic obstructive pulmonary disease: Secondary | ICD-10-CM | POA: Diagnosis not present

## 2023-08-29 DIAGNOSIS — E440 Moderate protein-calorie malnutrition: Secondary | ICD-10-CM | POA: Diagnosis not present

## 2023-09-03 DIAGNOSIS — E440 Moderate protein-calorie malnutrition: Secondary | ICD-10-CM | POA: Diagnosis not present

## 2023-09-03 DIAGNOSIS — F419 Anxiety disorder, unspecified: Secondary | ICD-10-CM | POA: Diagnosis not present

## 2023-09-03 DIAGNOSIS — J9611 Chronic respiratory failure with hypoxia: Secondary | ICD-10-CM | POA: Diagnosis not present

## 2023-09-03 DIAGNOSIS — F32A Depression, unspecified: Secondary | ICD-10-CM | POA: Diagnosis not present

## 2023-09-03 DIAGNOSIS — J4489 Other specified chronic obstructive pulmonary disease: Secondary | ICD-10-CM | POA: Diagnosis not present

## 2023-09-03 DIAGNOSIS — J439 Emphysema, unspecified: Secondary | ICD-10-CM | POA: Diagnosis not present

## 2023-09-10 ENCOUNTER — Other Ambulatory Visit: Payer: Self-pay | Admitting: Adult Health

## 2023-09-10 DIAGNOSIS — F39 Unspecified mood [affective] disorder: Secondary | ICD-10-CM

## 2023-09-10 DIAGNOSIS — R630 Anorexia: Secondary | ICD-10-CM

## 2023-09-10 DIAGNOSIS — R109 Unspecified abdominal pain: Secondary | ICD-10-CM

## 2023-09-11 DIAGNOSIS — J439 Emphysema, unspecified: Secondary | ICD-10-CM | POA: Diagnosis not present

## 2023-09-11 DIAGNOSIS — J9611 Chronic respiratory failure with hypoxia: Secondary | ICD-10-CM | POA: Diagnosis not present

## 2023-09-11 DIAGNOSIS — F32A Depression, unspecified: Secondary | ICD-10-CM | POA: Diagnosis not present

## 2023-09-11 DIAGNOSIS — F419 Anxiety disorder, unspecified: Secondary | ICD-10-CM | POA: Diagnosis not present

## 2023-09-11 DIAGNOSIS — J4489 Other specified chronic obstructive pulmonary disease: Secondary | ICD-10-CM | POA: Diagnosis not present

## 2023-09-11 DIAGNOSIS — E440 Moderate protein-calorie malnutrition: Secondary | ICD-10-CM | POA: Diagnosis not present

## 2023-09-14 DIAGNOSIS — J9611 Chronic respiratory failure with hypoxia: Secondary | ICD-10-CM | POA: Diagnosis not present

## 2023-09-14 DIAGNOSIS — J4489 Other specified chronic obstructive pulmonary disease: Secondary | ICD-10-CM | POA: Diagnosis not present

## 2023-09-14 DIAGNOSIS — F419 Anxiety disorder, unspecified: Secondary | ICD-10-CM | POA: Diagnosis not present

## 2023-09-14 DIAGNOSIS — E440 Moderate protein-calorie malnutrition: Secondary | ICD-10-CM | POA: Diagnosis not present

## 2023-09-14 DIAGNOSIS — F32A Depression, unspecified: Secondary | ICD-10-CM | POA: Diagnosis not present

## 2023-09-14 DIAGNOSIS — J439 Emphysema, unspecified: Secondary | ICD-10-CM | POA: Diagnosis not present

## 2023-09-17 DIAGNOSIS — J4489 Other specified chronic obstructive pulmonary disease: Secondary | ICD-10-CM | POA: Diagnosis not present

## 2023-09-17 DIAGNOSIS — F32A Depression, unspecified: Secondary | ICD-10-CM | POA: Diagnosis not present

## 2023-09-17 DIAGNOSIS — E440 Moderate protein-calorie malnutrition: Secondary | ICD-10-CM | POA: Diagnosis not present

## 2023-09-17 DIAGNOSIS — J439 Emphysema, unspecified: Secondary | ICD-10-CM | POA: Diagnosis not present

## 2023-09-17 DIAGNOSIS — F419 Anxiety disorder, unspecified: Secondary | ICD-10-CM | POA: Diagnosis not present

## 2023-09-17 DIAGNOSIS — J9611 Chronic respiratory failure with hypoxia: Secondary | ICD-10-CM | POA: Diagnosis not present

## 2023-09-19 DIAGNOSIS — Z8616 Personal history of COVID-19: Secondary | ICD-10-CM | POA: Diagnosis not present

## 2023-09-19 DIAGNOSIS — G4700 Insomnia, unspecified: Secondary | ICD-10-CM | POA: Diagnosis not present

## 2023-09-19 DIAGNOSIS — Z9981 Dependence on supplemental oxygen: Secondary | ICD-10-CM | POA: Diagnosis not present

## 2023-09-19 DIAGNOSIS — R41841 Cognitive communication deficit: Secondary | ICD-10-CM | POA: Diagnosis not present

## 2023-09-19 DIAGNOSIS — M1990 Unspecified osteoarthritis, unspecified site: Secondary | ICD-10-CM | POA: Diagnosis not present

## 2023-09-19 DIAGNOSIS — E785 Hyperlipidemia, unspecified: Secondary | ICD-10-CM | POA: Diagnosis not present

## 2023-09-19 DIAGNOSIS — J4489 Other specified chronic obstructive pulmonary disease: Secondary | ICD-10-CM | POA: Diagnosis not present

## 2023-09-19 DIAGNOSIS — E440 Moderate protein-calorie malnutrition: Secondary | ICD-10-CM | POA: Diagnosis not present

## 2023-09-19 DIAGNOSIS — J9611 Chronic respiratory failure with hypoxia: Secondary | ICD-10-CM | POA: Diagnosis not present

## 2023-09-19 DIAGNOSIS — Z8701 Personal history of pneumonia (recurrent): Secondary | ICD-10-CM | POA: Diagnosis not present

## 2023-09-19 DIAGNOSIS — F419 Anxiety disorder, unspecified: Secondary | ICD-10-CM | POA: Diagnosis not present

## 2023-09-19 DIAGNOSIS — F32A Depression, unspecified: Secondary | ICD-10-CM | POA: Diagnosis not present

## 2023-09-19 DIAGNOSIS — R339 Retention of urine, unspecified: Secondary | ICD-10-CM | POA: Diagnosis not present

## 2023-09-19 DIAGNOSIS — E871 Hypo-osmolality and hyponatremia: Secondary | ICD-10-CM | POA: Diagnosis not present

## 2023-09-19 DIAGNOSIS — J439 Emphysema, unspecified: Secondary | ICD-10-CM | POA: Diagnosis not present

## 2023-09-19 DIAGNOSIS — E039 Hypothyroidism, unspecified: Secondary | ICD-10-CM | POA: Diagnosis not present

## 2023-09-24 DIAGNOSIS — J9611 Chronic respiratory failure with hypoxia: Secondary | ICD-10-CM | POA: Diagnosis not present

## 2023-09-24 DIAGNOSIS — F419 Anxiety disorder, unspecified: Secondary | ICD-10-CM | POA: Diagnosis not present

## 2023-09-24 DIAGNOSIS — E440 Moderate protein-calorie malnutrition: Secondary | ICD-10-CM | POA: Diagnosis not present

## 2023-09-24 DIAGNOSIS — J4489 Other specified chronic obstructive pulmonary disease: Secondary | ICD-10-CM | POA: Diagnosis not present

## 2023-09-24 DIAGNOSIS — F32A Depression, unspecified: Secondary | ICD-10-CM | POA: Diagnosis not present

## 2023-09-24 DIAGNOSIS — J439 Emphysema, unspecified: Secondary | ICD-10-CM | POA: Diagnosis not present

## 2023-09-26 DIAGNOSIS — F32A Depression, unspecified: Secondary | ICD-10-CM | POA: Diagnosis not present

## 2023-09-26 DIAGNOSIS — J439 Emphysema, unspecified: Secondary | ICD-10-CM | POA: Diagnosis not present

## 2023-09-26 DIAGNOSIS — E440 Moderate protein-calorie malnutrition: Secondary | ICD-10-CM | POA: Diagnosis not present

## 2023-09-26 DIAGNOSIS — F419 Anxiety disorder, unspecified: Secondary | ICD-10-CM | POA: Diagnosis not present

## 2023-09-26 DIAGNOSIS — J4489 Other specified chronic obstructive pulmonary disease: Secondary | ICD-10-CM | POA: Diagnosis not present

## 2023-09-26 DIAGNOSIS — J9611 Chronic respiratory failure with hypoxia: Secondary | ICD-10-CM | POA: Diagnosis not present

## 2023-10-03 DIAGNOSIS — E440 Moderate protein-calorie malnutrition: Secondary | ICD-10-CM | POA: Diagnosis not present

## 2023-10-03 DIAGNOSIS — J4489 Other specified chronic obstructive pulmonary disease: Secondary | ICD-10-CM | POA: Diagnosis not present

## 2023-10-03 DIAGNOSIS — J9611 Chronic respiratory failure with hypoxia: Secondary | ICD-10-CM | POA: Diagnosis not present

## 2023-10-03 DIAGNOSIS — F32A Depression, unspecified: Secondary | ICD-10-CM | POA: Diagnosis not present

## 2023-10-03 DIAGNOSIS — J439 Emphysema, unspecified: Secondary | ICD-10-CM | POA: Diagnosis not present

## 2023-10-03 DIAGNOSIS — F419 Anxiety disorder, unspecified: Secondary | ICD-10-CM | POA: Diagnosis not present

## 2023-10-06 ENCOUNTER — Other Ambulatory Visit: Payer: Self-pay | Admitting: Adult Health

## 2023-10-09 ENCOUNTER — Ambulatory Visit (INDEPENDENT_AMBULATORY_CARE_PROVIDER_SITE_OTHER): Payer: Medicare Other | Admitting: Adult Health

## 2023-10-09 ENCOUNTER — Encounter: Payer: Self-pay | Admitting: Adult Health

## 2023-10-09 VITALS — BP 120/68 | HR 72 | Temp 97.6°F | Ht 60.0 in | Wt 102.0 lb

## 2023-10-09 DIAGNOSIS — E785 Hyperlipidemia, unspecified: Secondary | ICD-10-CM

## 2023-10-09 DIAGNOSIS — J449 Chronic obstructive pulmonary disease, unspecified: Secondary | ICD-10-CM | POA: Diagnosis not present

## 2023-10-09 DIAGNOSIS — M810 Age-related osteoporosis without current pathological fracture: Secondary | ICD-10-CM

## 2023-10-09 DIAGNOSIS — E039 Hypothyroidism, unspecified: Secondary | ICD-10-CM | POA: Diagnosis not present

## 2023-10-09 DIAGNOSIS — G4700 Insomnia, unspecified: Secondary | ICD-10-CM | POA: Diagnosis not present

## 2023-10-09 DIAGNOSIS — F39 Unspecified mood [affective] disorder: Secondary | ICD-10-CM

## 2023-10-09 LAB — LIPID PANEL
Cholesterol: 184 mg/dL (ref 0–200)
HDL: 76.3 mg/dL (ref >39.00)
LDL Cholesterol: 95 mg/dL (ref 0–99)
NonHDL: 108
Total CHOL/HDL Ratio: 2
Triglycerides: 65 mg/dL (ref 0.0–149.0)
VLDL: 13 mg/dL (ref 0.0–40.0)

## 2023-10-09 LAB — COMPREHENSIVE METABOLIC PANEL
ALT: 8 U/L (ref 0–35)
AST: 17 U/L (ref 0–37)
Albumin: 4.4 g/dL (ref 3.5–5.2)
Alkaline Phosphatase: 36 U/L — ABNORMAL LOW (ref 39–117)
BUN: 20 mg/dL (ref 6–23)
CO2: 34 meq/L — ABNORMAL HIGH (ref 19–32)
Calcium: 9.5 mg/dL (ref 8.4–10.5)
Chloride: 96 meq/L (ref 96–112)
Creatinine, Ser: 0.8 mg/dL (ref 0.40–1.20)
GFR: 68.78 mL/min (ref >60.00)
Glucose, Bld: 95 mg/dL (ref 70–99)
Potassium: 4.9 meq/L (ref 3.5–5.1)
Sodium: 137 meq/L (ref 135–145)
Total Bilirubin: 0.2 mg/dL (ref 0.2–1.2)
Total Protein: 6.7 g/dL (ref 6.0–8.3)

## 2023-10-09 LAB — CBC WITH DIFFERENTIAL/PLATELET
Basophils Absolute: 0.1 10*3/uL (ref 0.0–0.1)
Basophils Relative: 0.5 % (ref 0.0–3.0)
Eosinophils Absolute: 0.2 10*3/uL (ref 0.0–0.7)
Eosinophils Relative: 2.1 % (ref 0.0–5.0)
HCT: 36.9 % (ref 36.0–46.0)
Hemoglobin: 11.5 g/dL — ABNORMAL LOW (ref 12.0–15.0)
Lymphocytes Relative: 12.7 % (ref 12.0–46.0)
Lymphs Abs: 1.3 10*3/uL (ref 0.7–4.0)
MCHC: 31.2 g/dL (ref 30.0–36.0)
MCV: 94.1 fl (ref 78.0–100.0)
Monocytes Absolute: 0.4 10*3/uL (ref 0.1–1.0)
Monocytes Relative: 3.6 % (ref 3.0–12.0)
Neutro Abs: 8.3 10*3/uL — ABNORMAL HIGH (ref 1.4–7.7)
Neutrophils Relative %: 81.1 % — ABNORMAL HIGH (ref 43.0–77.0)
Platelets: 301 10*3/uL (ref 150.0–400.0)
RBC: 3.92 Mil/uL (ref 3.87–5.11)
RDW: 21.1 % — ABNORMAL HIGH (ref 11.5–15.5)
WBC: 10.3 10*3/uL (ref 4.0–10.5)

## 2023-10-09 LAB — VITAMIN D 25 HYDROXY (VIT D DEFICIENCY, FRACTURES): VITD: 40.05 ng/mL (ref 30.00–100.00)

## 2023-10-09 LAB — TSH: TSH: 9.58 u[IU]/mL — ABNORMAL HIGH (ref 0.35–5.50)

## 2023-10-09 MED ORDER — TEMAZEPAM 15 MG PO CAPS: ORAL_CAPSULE | ORAL | 2 refills | Status: DC

## 2023-10-09 NOTE — Progress Notes (Signed)
 Subjective:    Patient ID: Heidi [name], female    DOB: [birth year], 83 y.o.   MRN: [id number]  HPI Patient presents for yearly preventative medicine examination. She is a pleasant 83 year old female who  has a past medical history of Arthritis, Cerebrovascular disease, Chronic diastolic CHF (congestive heart failure) (HCC) (02/23/2016), COPD (chronic obstructive pulmonary disease) (HCC), Depression, Diverticulosis, Esophageal stricture, Generalized anxiety disorder, History of pneumonia, History of stroke, adenomatous colonic polyps, Hyperlipidemia, Hypothyroidism, Internal hemorrhoids, Osteoporosis, Pneumonia, Positive ANA (antinuclear antibody), Pulmonary nodule, Rectal ulcer, Shortness of breath, and 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(HCC).  She continues to live by herself.  Her daughter-in-law Heidi [name] keeps a close eye on her and visits her almost every day.  She also has a small [place] that she cares for and enjoys the company  End-stage COPD.  She is no longer being seen by pulmonary, last time was in November 2020.  She is currently managed with allergy Ellipta 100-60 2.5-25 mg daily, chronic steroid therapy Medrol 4 mg daily, Mucinex, albuterol inhaler as needed and DuoNeb TID.  Continues to have baseline dyspnea on exertion but does not feel as though she needs to use her as needed nebulizer or rescue inhaler. Is on continuous oxygen therapy at 2-3 L via [place]. On chest xray in January 2025 it was noted that she had  Ill-defined 7 mm nodular opacity overlying the lateral aspect of the mid to lower left lung. Family/patient has discussed and would like to hold off on ay further imaging to treatment at this time.   Hypothyroidism -managed with Synthroid 75 mcg daily Lab Results  Component Value Date   TSH 0.42 05/31/2022   Insomnia-uses Restoril 15 mg nightly.  She feels as though she gets good sleep on this medication  Mood disorder/anxiety-currently managed with Lexapro 10 mg  daily.  Hyperlipidemia-takes Zocor 10 mg daily.  She denies myalgia or fatigue  Lab Results  Component Value Date   CHOL 176 05/31/2022   HDL 86.90 05/31/2022   LDLCALC 73 05/31/2022   LDLDIRECT 340.7 10/14/2012   TRIG 81.0 05/31/2022   CHOLHDL 2 05/31/2022    Osteoporosis-managed with Fosamax 70 mg every week.  She denies any recent falls or fractures.  She is overdue for bone density screening, last was in 2016 showed a T score of -4.8   All immunizations and health maintenance protocols were reviewed with the patient and needed orders were placed. Encouraged to get shingles vaccination at pharmacy.   Appropriate screening laboratory values were ordered for the patient including screening of hyperlipidemia, renal function and hepatic function.   Medication reconciliation,  past medical history, social history, problem list and allergies were reviewed in detail with the patient  Goals were established with regard to weight loss, exercise, and  diet in compliance with medications Wt Readings from Last 3 Encounters:  10/09/23 102 lb (46.3 kg)  05/31/22 97 lb (44 kg)  05/31/22 97 lb 14.4 oz (44.4 kg)    Review of Systems  Constitutional: Negative.   HENT: Negative.    Eyes: Negative.   Respiratory: Negative.    Cardiovascular: Negative.   Gastrointestinal: Negative.   Endocrine: Negative.   Genitourinary: Negative.   Musculoskeletal:  Positive for arthralgias and back pain.  Skin: Negative.   Allergic/Immunologic: Negative.   Neurological: Negative.   Hematological: Negative.   Psychiatric/Behavioral:  Positive for sleep disturbance.    Past Medical History:  Diagnosis Date   Arthritis    Cerebrovascular disease  Chronic diastolic CHF (congestive heart failure) (HCC) 02/23/2016   COPD (chronic obstructive pulmonary disease) (HCC)    Depression    Diverticulosis    Esophageal stricture    Generalized anxiety disorder    History of pneumonia    History of stroke     mild oncoming   Hx of adenomatous colonic polyps    with high grade dysplasia   Hyperlipidemia    Hypothyroidism    Internal hemorrhoids    Osteoporosis    Pneumonia    HX of   Positive ANA (antinuclear antibody)    Pulmonary nodule    Rectal ulcer    Shortness of breath    periodically with COPD flair up   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(HCC)     Social History   Socioeconomic History   Marital status: Widowed    Spouse name: Not on file   Number of children: 3   Years of education: Not on file   Highest education level: Not on file  Occupational History   Occupation: RETIRED    Employer: RETIRED  Tobacco Use   Smoking status: Former    Current packs/day: 0.00    Average packs/day: 2.5 packs/day for 50.0 years (125.0 ttl pk-yrs)    Types: Cigarettes    Start date: 11/02/1959    Quit date: 11/01/2009    Years since quitting: 13.9   Smokeless tobacco: Never  Vaping Use   Vaping status: Never Used  Substance and Sexual Activity   Alcohol use: No   Drug use: No   Sexual activity: Not Currently  Other Topics Concern   Not on file  Social History Narrative   Patient has no recent travel. She is smoking 2 packs per day and has smoked since age of 58. Has cats and dogs at home. No bird or mold exposure.       dtr with temporary    Social Drivers of Health   Financial Resource Strain: Low Risk  (05/31/2022)   Overall Financial Resource Strain (CARDIA)    Difficulty of Paying Living Expenses: Not hard at all  Food Insecurity: No Food Insecurity (05/31/2022)   Hunger Vital Sign    Worried About Running Out of Food in the Last Year: Never true    Ran Out of Food in the Last Year: Never true  Transportation Needs: No Transportation Needs (05/31/2022)   PRAPARE - Administrator, Civil Service (Medical): No    Lack of Transportation (Non-Medical): No  Physical Activity: Insufficiently Active (05/31/2022)   Exercise Vital Sign    Days of Exercise per Week: 7 days     Minutes of Exercise per Session: 10 min  Stress: No Stress Concern Present (05/31/2022)   [employer] of Occupational Health - Occupational Stress Questionnaire    Feeling of Stress : Not at all  Social Connections: Moderately Integrated (05/31/2022)   Social Connection and Isolation Panel [NHANES]    Frequency of Communication with Friends and Family: More than three times a week    Frequency of Social Gatherings with Friends and Family: More than three times a week    Attends Religious Services: More than 4 times per year    Active Member of [employer] or Organizations: Yes    Attends Banker Meetings: More than 4 times per year    Marital Status: Widowed  Intimate Partner Violence: Not At Risk (05/31/2022)   Humiliation, Afraid, Rape, and Kick questionnaire    Fear of Current or Ex-Partner:  No    Emotionally Abused: No    Physically Abused: No    Sexually Abused: No    Past Surgical History:  Procedure Laterality Date   BLADDER SURGERY     tack   CATARACT EXTRACTION     bilateral   COLONOSCOPY  06/22/2011   Procedure: COLONOSCOPY;  Surgeon: Hart Carwin, MD;  Location: [facility];  Service: Endoscopy;  Laterality: N/A;   GASTRIC VARICES BANDING  06/22/2011   Procedure: HEMORRHOID BANDING;  Surgeon: Hart Carwin, MD;  Location: [facility];  Service: Endoscopy;  Laterality: N/A;   HEMICOLECTOMY  2012   emergent surgery [facility]   HIATAL HERNIA REPAIR     x 2   TONSILLECTOMY     TOTAL ABDOMINAL HYSTERECTOMY W/ BILATERAL SALPINGOOPHORECTOMY  1998    Family History  Problem Relation Age of Onset   Colon cancer Mother    COPD Father    Heart attack Father    Stroke Maternal Aunt    Colon polyps Brother    Liver disease Maternal Aunt    Breast cancer Maternal Aunt    Hypertension Son    Hypothyroidism Daughter     Allergies  Allergen Reactions   Morphine And Codeine Nausea And Vomiting   Penicillins Hives    Has patient had a PCN reaction  causing immediate rash, facial/tongue/throat swelling, SOB or lightheadedness with hypotension: No Has patient had a PCN reaction causing severe rash involving mucus membranes or skin necrosis: No Has patient had a PCN reaction that required hospitalization No Has patient had a PCN reaction occurring within the last 10 years: No If all of the above answers are "NO", then may proceed with Cephalosporin use.    Current Outpatient Medications on File Prior to Visit  Medication Sig Dispense Refill   albuterol (VENTOLIN HFA) 108 (90 Base) MCG/ACT inhaler Inhale 2 puffs into the lungs every 6 (six) hours as needed for wheezing or shortness of breath. 8 g 2   alendronate (FOSAMAX) 70 MG tablet TAKE 1 TABLET ONCE A WEEK WITH A FULL GLASS OF WATER ON AN EMPTY STOMACH 12 tablet 3   escitalopram (LEXAPRO) 10 MG tablet TAKE 1 TABLET BY MOUTH EVERY DAY 90 tablet 1   furosemide (LASIX) 20 MG tablet TAKE 1 TABLET BY MOUTH EVERY DAY AS NEEDED 30 tablet 0   gabapentin (NEURONTIN) 300 MG capsule TAKE 1 CAPSULE BY MOUTH TWICE A DAY 180 capsule 1   ipratropium-albuterol (DUONEB) 0.5-2.5 (3) MG/3ML SOLN Take 3 mLs by nebulization 3 (three) times daily. 120 mL 3   levothyroxine (SYNTHROID) 75 MCG tablet TAKE ONE (1) TABLET ONCE DAILY BEFORE BREAKFAST 90 tablet 2   methylPREDNISolone (MEDROL) 4 MG tablet TAKE 1 TABLET BY MOUTH EVERY DAY 90 tablet 2   naproxen sodium (ALEVE) 220 MG tablet Take 220 mg by mouth daily as needed.     omeprazole (PRILOSEC) 20 MG capsule TAKE 1 CAPSULE (20 MG TOTAL) BY MOUTH DAILY. PLEASE SCHEDULE PHYSICAL EXAM 90 capsule 0   pseudoephedrine-guaifenesin (MUCINEX D) 60-600 MG 12 hr tablet Take 1 tablet by mouth 2 (two) times daily as needed for congestion.     simvastatin (ZOCOR) 20 MG tablet TAKE 1 TABLET BY MOUTH EVERYDAY AT BEDTIME 90 tablet 0   TRELEGY ELLIPTA 100-62.5-25 MCG/ACT AEPB TAKE 1 PUFF BY MOUTH EVERY DAY 60 each 6   No current facility-administered medications on file prior  to visit.    BP 120/68   Pulse 72  Temp 97.6 F (36.4 C) (Oral)   Ht 5' (1.524 m)   Wt 102 lb (46.3 kg)   SpO2 94%   BMI 19.92 kg/m       Objective:   Physical Exam Vitals and nursing note reviewed.  Constitutional:      General: She is not in acute distress.    Appearance: Normal appearance. She is underweight. She is ill-appearing (chronically).  HENT:     Head: Normocephalic and atraumatic.     Right Ear: Tympanic membrane, ear canal and external ear normal. There is no impacted cerumen.     Left Ear: Tympanic membrane, ear canal and external ear normal. There is no impacted cerumen.     Nose: Nose normal. No congestion or rhinorrhea.     Mouth/Throat:     Mouth: Mucous membranes are moist.     Pharynx: Oropharynx is clear.  Eyes:     Extraocular Movements: Extraocular movements intact.     Conjunctiva/sclera: Conjunctivae normal.     Pupils: Pupils are equal, round, and reactive to light.  Neck:     Vascular: No carotid bruit.  Cardiovascular:     Rate and Rhythm: Normal rate and regular rhythm.     Pulses: Normal pulses.     Heart sounds: No murmur heard.    No friction rub. No gallop.  Pulmonary:     Effort: Pulmonary effort is normal.     Breath sounds: Normal breath sounds.     Comments: 2 L via [place]  Abdominal:     General: Abdomen is flat. Bowel sounds are normal. There is no distension.     Palpations: Abdomen is soft. There is no mass.     Tenderness: There is no abdominal tenderness. There is no guarding or rebound.     Hernia: No hernia is present.  Musculoskeletal:        General: Normal range of motion.     Cervical back: Normal range of motion and neck supple.  Lymphadenopathy:     Cervical: No cervical adenopathy.  Skin:    General: Skin is warm and dry.     Capillary Refill: Capillary refill takes less than 2 seconds.  Neurological:     General: No focal deficit present.     Mental Status: She is alert and oriented to person, place, and  time.     Motor: Weakness (In wheelchair during exam) present.     Gait: Gait abnormal.  Psychiatric:        Mood and Affect: Mood normal.        Behavior: Behavior normal.        Thought Content: Thought content normal.        Judgment: Judgment normal.        Assessment & Plan:  1. COPD mixed type (HCC) (Primary) - Well controlled. No change in therapy.  - CBC with Differential/Platelet; Future - Comprehensive metabolic panel; Future - Lipid panel; Future - TSH; Future - VITAMIN D 25 Hydroxy (Vit-D Deficiency, Fractures); Future  2. Hypothyroidism, unspecified type - Consider increase in synthroid  - CBC with Differential/Platelet; Future - Comprehensive metabolic panel; Future - Lipid panel; Future - TSH; Future - VITAMIN D 25 Hydroxy (Vit-D Deficiency, Fractures); Future  3. Insomnia, unspecified type -Well  - CBC with Differential/Platelet; Future - Comprehensive metabolic panel; Future - Lipid panel; Future - TSH; Future - VITAMIN D 25 Hydroxy (Vit-D Deficiency, Fractures); Future - temazepam (RESTORIL) 15 MG capsule; TAKE 1 CAPSULE BY MOUTH  AT BEDTIME AS NEEDED FOR SLEEP  Dispense: 30 capsule; Refill: 2  4. Mood disorder (HCC) - Continue with Lexapro 10 mg daily.  - CBC with Differential/Platelet; Future - Comprehensive metabolic panel; Future - Lipid panel; Future - TSH; Future - VITAMIN D 25 Hydroxy (Vit-D Deficiency, Fractures); Future  5. Hyperlipidemia, unspecified hyperlipidemia type - Consider increase in statin  - CBC with Differential/Platelet; Future - Comprehensive metabolic panel; Future - Lipid panel; Future - TSH; Future - VITAMIN D 25 Hydroxy (Vit-D Deficiency, Fractures); Future  6. Age-related osteoporosis without current pathological fracture - Will order Bone Density - CBC with Differential/Platelet; Future - Comprehensive metabolic panel; Future - Lipid panel; Future - TSH; Future - VITAMIN D 25 Hydroxy (Vit-D Deficiency,  Fractures); Future - DG Bone Density; Future  Shirline Frees, NP

## 2023-10-09 NOTE — Patient Instructions (Signed)
 It was great seeing you today   We will follow up with you regarding your lab work   Please let me know if you need anything

## 2023-10-10 DIAGNOSIS — J439 Emphysema, unspecified: Secondary | ICD-10-CM | POA: Diagnosis not present

## 2023-10-10 DIAGNOSIS — J4489 Other specified chronic obstructive pulmonary disease: Secondary | ICD-10-CM | POA: Diagnosis not present

## 2023-10-10 DIAGNOSIS — J9611 Chronic respiratory failure with hypoxia: Secondary | ICD-10-CM | POA: Diagnosis not present

## 2023-10-10 DIAGNOSIS — F32A Depression, unspecified: Secondary | ICD-10-CM | POA: Diagnosis not present

## 2023-10-10 DIAGNOSIS — E440 Moderate protein-calorie malnutrition: Secondary | ICD-10-CM | POA: Diagnosis not present

## 2023-10-10 DIAGNOSIS — F419 Anxiety disorder, unspecified: Secondary | ICD-10-CM | POA: Diagnosis not present

## 2023-10-17 DIAGNOSIS — E440 Moderate protein-calorie malnutrition: Secondary | ICD-10-CM | POA: Diagnosis not present

## 2023-10-17 DIAGNOSIS — J9611 Chronic respiratory failure with hypoxia: Secondary | ICD-10-CM | POA: Diagnosis not present

## 2023-10-17 DIAGNOSIS — J439 Emphysema, unspecified: Secondary | ICD-10-CM | POA: Diagnosis not present

## 2023-10-17 DIAGNOSIS — F32A Depression, unspecified: Secondary | ICD-10-CM | POA: Diagnosis not present

## 2023-10-17 DIAGNOSIS — J4489 Other specified chronic obstructive pulmonary disease: Secondary | ICD-10-CM | POA: Diagnosis not present

## 2023-10-17 DIAGNOSIS — F419 Anxiety disorder, unspecified: Secondary | ICD-10-CM | POA: Diagnosis not present

## 2023-10-18 DIAGNOSIS — J4489 Other specified chronic obstructive pulmonary disease: Secondary | ICD-10-CM | POA: Diagnosis not present

## 2023-10-18 DIAGNOSIS — J439 Emphysema, unspecified: Secondary | ICD-10-CM | POA: Diagnosis not present

## 2023-10-18 DIAGNOSIS — F32A Depression, unspecified: Secondary | ICD-10-CM | POA: Diagnosis not present

## 2023-10-18 DIAGNOSIS — F419 Anxiety disorder, unspecified: Secondary | ICD-10-CM | POA: Diagnosis not present

## 2023-10-18 DIAGNOSIS — E440 Moderate protein-calorie malnutrition: Secondary | ICD-10-CM | POA: Diagnosis not present

## 2023-10-18 DIAGNOSIS — J9611 Chronic respiratory failure with hypoxia: Secondary | ICD-10-CM | POA: Diagnosis not present

## 2023-10-19 DIAGNOSIS — M1990 Unspecified osteoarthritis, unspecified site: Secondary | ICD-10-CM | POA: Diagnosis not present

## 2023-10-19 DIAGNOSIS — J4489 Other specified chronic obstructive pulmonary disease: Secondary | ICD-10-CM | POA: Diagnosis not present

## 2023-10-19 DIAGNOSIS — E440 Moderate protein-calorie malnutrition: Secondary | ICD-10-CM | POA: Diagnosis not present

## 2023-10-19 DIAGNOSIS — F419 Anxiety disorder, unspecified: Secondary | ICD-10-CM | POA: Diagnosis not present

## 2023-10-19 DIAGNOSIS — Z8616 Personal history of COVID-19: Secondary | ICD-10-CM | POA: Diagnosis not present

## 2023-10-19 DIAGNOSIS — E871 Hypo-osmolality and hyponatremia: Secondary | ICD-10-CM | POA: Diagnosis not present

## 2023-10-19 DIAGNOSIS — G4700 Insomnia, unspecified: Secondary | ICD-10-CM | POA: Diagnosis not present

## 2023-10-19 DIAGNOSIS — J9611 Chronic respiratory failure with hypoxia: Secondary | ICD-10-CM | POA: Diagnosis not present

## 2023-10-19 DIAGNOSIS — Z9981 Dependence on supplemental oxygen: Secondary | ICD-10-CM | POA: Diagnosis not present

## 2023-10-19 DIAGNOSIS — E039 Hypothyroidism, unspecified: Secondary | ICD-10-CM | POA: Diagnosis not present

## 2023-10-19 DIAGNOSIS — R41841 Cognitive communication deficit: Secondary | ICD-10-CM | POA: Diagnosis not present

## 2023-10-19 DIAGNOSIS — E785 Hyperlipidemia, unspecified: Secondary | ICD-10-CM | POA: Diagnosis not present

## 2023-10-19 DIAGNOSIS — J439 Emphysema, unspecified: Secondary | ICD-10-CM | POA: Diagnosis not present

## 2023-10-19 DIAGNOSIS — F32A Depression, unspecified: Secondary | ICD-10-CM | POA: Diagnosis not present

## 2023-10-19 DIAGNOSIS — Z8701 Personal history of pneumonia (recurrent): Secondary | ICD-10-CM | POA: Diagnosis not present

## 2023-10-19 DIAGNOSIS — R339 Retention of urine, unspecified: Secondary | ICD-10-CM | POA: Diagnosis not present

## 2023-10-25 ENCOUNTER — Inpatient Hospital Stay (HOSPITAL_BASED_OUTPATIENT_CLINIC_OR_DEPARTMENT_OTHER): Admission: RE | Admit: 2023-10-25 | Source: Ambulatory Visit | Admitting: Radiology

## 2023-10-29 ENCOUNTER — Ambulatory Visit (INDEPENDENT_AMBULATORY_CARE_PROVIDER_SITE_OTHER)

## 2023-10-29 VITALS — Ht 61.5 in | Wt 102.0 lb

## 2023-10-29 DIAGNOSIS — Z0000 Encounter for general adult medical examination without abnormal findings: Secondary | ICD-10-CM

## 2023-10-29 NOTE — Progress Notes (Signed)
 Subjective:   Heidi [name] is a 83 y.o. who presents for a Medicare Wellness preventive visit.  Visit Complete: Virtual I connected with  Heidi [name] on 10/29/23 by a audio enabled telemedicine application and verified that I am speaking with the correct person using two identifiers.  Patient Location: Home  Provider Location: Home Office  I discussed the limitations of evaluation and management by telemedicine. The patient expressed understanding and agreed to proceed.  Vital Signs: Because this visit was a virtual/telehealth visit, some criteria may be missing or patient reported. Any vitals not documented were not able to be obtained and vitals that have been documented are patient reported.    Persons Participating in Visit: Patient.  AWV Questionnaire: No: Patient Medicare AWV questionnaire was not completed prior to this visit.  Cardiac Risk Factors include: advanced age (>62men, >71 women)     Objective:    Today's Vitals   10/29/23 0847  Weight: 102 lb (46.3 kg)  Height: 5' 1.5" (1.562 m)   Body mass index is 18.96 kg/m.     10/29/2023    8:57 AM 05/31/2022    9:14 AM 05/13/2021    9:11 AM 04/13/2017    8:12 AM 07/26/2016    8:37 AM 04/19/2015   11:04 AM 06/22/2011    2:28 PM  Advanced Directives  Does Patient Have a Medical Advance Directive? Yes Yes No Yes Yes No Patient does not have advance directive  Type of Advance Directive Healthcare Power of [place];Living will Healthcare Power of [place];Living will  Healthcare Power of [place];Living will Healthcare Power of [place];Living will    Does patient want to make changes to medical advance directive?    No - Patient declined No - Patient declined    Copy of Healthcare Power of Attorney in Chart? No - copy requested No - copy requested  No - copy requested     Would patient like information on creating a medical advance directive?   No - Patient declined   No - patient declined information      Current Medications (verified) Outpatient Encounter Medications as of 10/29/2023  Medication Sig   albuterol (VENTOLIN HFA) 108 (90 Base) MCG/ACT inhaler Inhale 2 puffs into the lungs every 6 (six) hours as needed for wheezing or shortness of breath.   alendronate (FOSAMAX) 70 MG tablet TAKE 1 TABLET ONCE A WEEK WITH A FULL GLASS OF WATER ON AN EMPTY STOMACH   escitalopram (LEXAPRO) 10 MG tablet TAKE 1 TABLET BY MOUTH EVERY DAY   furosemide (LASIX) 20 MG tablet TAKE 1 TABLET BY MOUTH EVERY DAY AS NEEDED   gabapentin (NEURONTIN) 300 MG capsule TAKE 1 CAPSULE BY MOUTH TWICE A DAY   ipratropium-albuterol (DUONEB) 0.5-2.5 (3) MG/3ML SOLN Take 3 mLs by nebulization 3 (three) times daily.   levothyroxine (SYNTHROID) 75 MCG tablet TAKE ONE (1) TABLET ONCE DAILY BEFORE BREAKFAST   methylPREDNISolone (MEDROL) 4 MG tablet TAKE 1 TABLET BY MOUTH EVERY DAY   naproxen sodium (ALEVE) 220 MG tablet Take 220 mg by mouth daily as needed.   omeprazole (PRILOSEC) 20 MG capsule TAKE 1 CAPSULE (20 MG TOTAL) BY MOUTH DAILY. PLEASE SCHEDULE PHYSICAL EXAM   pseudoephedrine-guaifenesin (MUCINEX D) 60-600 MG 12 hr tablet Take 1 tablet by mouth 2 (two) times daily as needed for congestion.   simvastatin (ZOCOR) 20 MG tablet TAKE 1 TABLET BY MOUTH EVERYDAY AT BEDTIME   temazepam (RESTORIL) 15 MG capsule TAKE 1 CAPSULE BY MOUTH AT BEDTIME AS  NEEDED FOR SLEEP   TRELEGY ELLIPTA 100-62.5-25 MCG/ACT AEPB TAKE 1 PUFF BY MOUTH EVERY DAY   No facility-administered encounter medications on file as of 10/29/2023.    Allergies (verified) Morphine and codeine and Penicillins   History: Past Medical History:  Diagnosis Date   Arthritis    Cerebrovascular disease    Chronic diastolic CHF (congestive heart failure) (HCC) 02/23/2016   COPD (chronic obstructive pulmonary disease) (HCC)    Depression    Diverticulosis    Esophageal stricture    Generalized anxiety disorder    History of pneumonia    History of stroke     mild oncoming   Hx of adenomatous colonic polyps    with high grade dysplasia   Hyperlipidemia    Hypothyroidism    Internal hemorrhoids    Osteoporosis    Pneumonia    HX of   Positive ANA (antinuclear antibody)    Pulmonary nodule    Rectal ulcer    Shortness of breath    periodically with COPD flair up   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(HCC)    Past Surgical History:  Procedure Laterality Date   BLADDER SURGERY     tack   CATARACT EXTRACTION     bilateral   COLONOSCOPY  06/22/2011   Procedure: COLONOSCOPY;  Surgeon: Hart Carwin, MD;  Location: [facility];  Service: Endoscopy;  Laterality: N/A;   GASTRIC VARICES BANDING  06/22/2011   Procedure: HEMORRHOID BANDING;  Surgeon: Hart Carwin, MD;  Location: [facility];  Service: Endoscopy;  Laterality: N/A;   HEMICOLECTOMY  2012   emergent surgery [facility]   HIATAL HERNIA REPAIR     x 2   TONSILLECTOMY     TOTAL ABDOMINAL HYSTERECTOMY W/ BILATERAL SALPINGOOPHORECTOMY  1998   Family History  Problem Relation Age of Onset   Colon cancer Mother    COPD Father    Heart attack Father    Stroke Maternal Aunt    Colon polyps Brother    Liver disease Maternal Aunt    Breast cancer Maternal Aunt    Hypertension Son    Hypothyroidism Daughter    Social History   Socioeconomic History   Marital status: Widowed    Spouse name: Not on file   Number of children: 3   Years of education: Not on file   Highest education level: Not on file  Occupational History   Occupation: RETIRED    Employer: RETIRED  Tobacco Use   Smoking status: Former    Current packs/day: 0.00    Average packs/day: 2.5 packs/day for 50.0 years (125.0 ttl pk-yrs)    Types: Cigarettes    Start date: 11/02/1959    Quit date: 11/01/2009    Years since quitting: 14.0   Smokeless tobacco: Never  Vaping Use   Vaping status: Never Used  Substance and Sexual Activity   Alcohol use: No   Drug use: No   Sexual activity: Not Currently  Other Topics Concern    Not on file  Social History Narrative   Patient has no recent travel. She is smoking 2 packs per day and has smoked since age of 36. Has cats and dogs at home. No bird or mold exposure.       dtr with temporary    Social Drivers of Health   Financial Resource Strain: Low Risk  (10/29/2023)   Overall Financial Resource Strain (CARDIA)    Difficulty of Paying Living Expenses: Not hard at all  Food Insecurity:  No Food Insecurity (10/29/2023)   Hunger Vital Sign    Worried About Running Out of Food in the Last Year: Never true    Ran Out of Food in the Last Year: Never true  Transportation Needs: No Transportation Needs (10/29/2023)   PRAPARE - Administrator, Civil Service (Medical): No    Lack of Transportation (Non-Medical): No  Physical Activity: Sufficiently Active (10/29/2023)   Exercise Vital Sign    Days of Exercise per Week: 5 days    Minutes of Exercise per Session: 30 min  Stress: No Stress Concern Present (10/29/2023)   [employer] of Occupational Health - Occupational Stress Questionnaire    Feeling of Stress : Not at all  Social Connections: Moderately Integrated (10/29/2023)   Social Connection and Isolation Panel [NHANES]    Frequency of Communication with Friends and Family: More than three times a week    Frequency of Social Gatherings with Friends and Family: More than three times a week    Attends Religious Services: More than 4 times per year    Active Member of [employer] or Organizations: Yes    Attends Banker Meetings: More than 4 times per year    Marital Status: Widowed    Tobacco Counseling Counseling given: Not Answered    Clinical Intake:  Pre-visit preparation completed: Yes  Pain : No/denies pain     BMI - recorded: 18.96 Nutritional Status: BMI <19  Underweight Nutritional Risks: None Diabetes: No  No results found for: "HGBA1C"   How often do you need to have someone help you when you read instructions,  pamphlets, or other written materials from your doctor or pharmacy?: 1 - Never  Interpreter Needed?: No  Information entered by :: Farris Hong LPN   Activities of Daily Living      10/29/2023    8:56 AM  In your present state of health, do you have any difficulty performing the following activities:  Hearing? 0  Vision? 0  Difficulty concentrating or making decisions? 0  Walking or climbing stairs? 0  Dressing or bathing? 1  Comment Family assist  Doing errands, shopping? 1  Comment Family Ship broker and eating ? N  Using the Toilet? N  In the past six months, have you accidently leaked urine? N  Do you have problems with loss of bowel control? N  Managing your Medications? N  Managing your Finances? N  Housekeeping or managing your Housekeeping? N    Patient Care Team: Alto Atta, NP as PCP - General (Family Medicine)  Indicate any recent Medical Services you may have received from other than [facility] providers in the past year (date may be approximate).     Assessment:   This is a routine wellness examination for Heidi [name].  Hearing/Vision screen Hearing Screening - Comments:: Denies hearing difficulties   Vision Screening - Comments:: Wears rx glasses - up to date with routine eye exams with  Dr Gennie Kicks   Goals Addressed               This Visit's Progress     Increase physical activity (pt-stated)        Remain active.       Depression Screen     10/29/2023    8:54 AM 08/14/2023   10:02 AM 05/31/2022    9:07 AM 05/13/2021    9:12 AM 05/13/2021    9:09 AM 03/17/2021    7:12 AM 12/06/2017   12:44 PM  PHQ 2/9 Scores  PHQ - 2 Score 0 0 0 0 0 1 2  PHQ- 9 Score  6    5 6     Fall Risk     10/29/2023    8:57 AM 08/14/2023   10:02 AM 05/31/2022    9:11 AM 05/13/2021    9:12 AM 03/17/2021    7:12 AM  Fall Risk   Falls in the past year? 0 0 1 0 0  Number falls in past yr: 0 0 0 0 0  Injury with Fall? 0 0 0 0 0  Comment   Followed by medical  attention. No injury    Risk for fall due to : No Fall Risks  No Fall Risks No Fall Risks   Follow up Falls prevention discussed;Falls evaluation completed Falls evaluation completed Falls prevention discussed Falls evaluation completed     MEDICARE RISK AT HOME:  Medicare Risk at Home Any stairs in or around the home?: No If so, are there any without handrails?: Yes Home free of loose throw rugs in walkways, pet beds, electrical cords, etc?: Yes Adequate lighting in your home to reduce risk of falls?: Yes Life alert?: No Use of a cane, walker or w/c?: No Grab bars in the bathroom?: No Shower chair or bench in shower?: Yes Elevated toilet seat or a handicapped toilet?: Yes  TIMED UP AND GO:  Was the test performed?  No  Cognitive Function: 6CIT completed    07/26/2016    8:50 AM  MMSE - Mini Mental State Exam  Not completed: --        10/29/2023    8:57 AM 05/31/2022    9:15 AM  6CIT Screen  What Year? 0 points 0 points  What month? 0 points 0 points  What time? 0 points 0 points  Count back from 20 0 points 0 points  Months in reverse 0 points 0 points  Repeat phrase 0 points 0 points  Total Score 0 points 0 points    Immunizations Immunization History  Administered Date(s) Administered   Fluad Quad(high Dose 65+) 04/22/2019, 06/09/2020, 03/17/2021, 05/31/2022   H1N1 06/30/2008   Influenza Split 06/10/2007, 04/14/2010, 04/18/2012   Influenza Whole 06/10/2007, 04/14/2010, 04/18/2012   Influenza, High Dose Seasonal PF 06/02/2015, 04/10/2016, 04/11/2017, 04/25/2018   Influenza,inj,Quad PF,6+ Mos 04/16/2013   MODERNA SARS COV-2 Pediatric Vaccination 6mos to <40yrs 05/17/2020   Moderna SARS-COV2 Booster Vaccination 10/16/2019, 06/29/2020   Pneumococcal Conjugate-13 06/02/2015   Pneumococcal Polysaccharide-23 05/17/2006, 04/16/2013   Td 03/29/1999   Tdap 03/29/1999, 04/16/2013   Zoster, Live 07/17/2013    Screening Tests Health Maintenance  Topic Date Due    Zoster Vaccines- Shingrix (1 of 2) 09[birth year]05/1960   COVID-19 Vaccine (1) 06/29/2020   DTaP/Tdap/Td (4 - Td or Tdap) 04/17/2023   INFLUENZA VACCINE  02/15/2024   Medicare Annual Wellness (AWV)  10/28/2024   Pneumonia Vaccine 70+ Years old  Completed   DEXA SCAN  Completed   HPV VACCINES  Aged Out   Meningococcal B Vaccine  Aged Out   Lung Cancer Screening  Discontinued   Hepatitis C Screening  Discontinued    Health Maintenance  Health Maintenance Due  Topic Date Due   Zoster Vaccines- Shingrix (1 of 2) 09[birth year]05/1960   COVID-19 Vaccine (1) 06/29/2020   DTaP/Tdap/Td (4 - Td or Tdap) 04/17/2023   Health Maintenance Items Addressed: Vaccines Deferred  Additional Screening:  Vision Screening: Recommended annual ophthalmology exams for early detection of glaucoma and other disorders  of the eye.  Dental Screening: Recommended annual dental exams for proper oral hygiene  Community Resource Referral / Chronic Care Management: CRR required this visit?  No   CCM required this visit?  No     Plan:     I have personally reviewed and noted the following in the patient's chart:   Medical and social history Use of alcohol, tobacco or illicit drugs  Current medications and supplements including opioid prescriptions. Patient is not currently taking opioid prescriptions. Functional ability and status Nutritional status Physical activity Advanced directives List of other physicians Hospitalizations, surgeries, and ER visits in previous 12 months Vitals Screenings to include cognitive, depression, and falls Referrals and appointments  In addition, I have reviewed and discussed with patient certain preventive protocols, quality metrics, and best practice recommendations. A written personalized care plan for preventive services as well as general preventive health recommendations were provided to patient.     Dewayne Ford, LPN   [id number]   After Visit Summary: (MyChart) Due to  this being a telephonic visit, the after visit summary with patients personalized plan was offered to patient via MyChart   Notes: Nothing significant to report at this time.

## 2023-10-29 NOTE — Patient Instructions (Addendum)
 Heidi [name] , Thank you for taking time to come for your Medicare Wellness Visit. I appreciate your ongoing commitment to your health goals. Please review the following plan we discussed and let me know if I can assist you in the future.   Referrals/Orders/Follow-Ups/Clinician Recommendations:   This is a list of the screening recommended for you and due dates:  Health Maintenance  Topic Date Due   Zoster (Shingles) Vaccine (1 of 2) 09[birth year]05/1960   COVID-19 Vaccine (1) 06/29/2020   DTaP/Tdap/Td vaccine (4 - Td or Tdap) 04/17/2023   Flu Shot  02/15/2024   Medicare Annual Wellness Visit  10/28/2024   Pneumonia Vaccine  Completed   DEXA scan (bone density measurement)  Completed   HPV Vaccine  Aged Out   Meningitis B Vaccine  Aged Out   Screening for Lung Cancer  Discontinued   Hepatitis C Screening  Discontinued    Advanced directives: (Copy Requested) Please bring a copy of your health care power of attorney and living will to the office to be added to your chart at your convenience. You can mail to [facility] [address] [place], [place] [address] or email to [email or web address]  Next Medicare Annual Wellness Visit scheduled for next year: Yes

## 2023-10-30 DIAGNOSIS — J4489 Other specified chronic obstructive pulmonary disease: Secondary | ICD-10-CM | POA: Diagnosis not present

## 2023-10-30 DIAGNOSIS — J9611 Chronic respiratory failure with hypoxia: Secondary | ICD-10-CM | POA: Diagnosis not present

## 2023-10-30 DIAGNOSIS — E440 Moderate protein-calorie malnutrition: Secondary | ICD-10-CM | POA: Diagnosis not present

## 2023-10-30 DIAGNOSIS — F32A Depression, unspecified: Secondary | ICD-10-CM | POA: Diagnosis not present

## 2023-10-30 DIAGNOSIS — J439 Emphysema, unspecified: Secondary | ICD-10-CM | POA: Diagnosis not present

## 2023-10-30 DIAGNOSIS — F419 Anxiety disorder, unspecified: Secondary | ICD-10-CM | POA: Diagnosis not present

## 2023-10-31 ENCOUNTER — Other Ambulatory Visit (HOSPITAL_COMMUNITY): Payer: Self-pay

## 2023-10-31 ENCOUNTER — Telehealth: Payer: Self-pay

## 2023-10-31 NOTE — Telephone Encounter (Signed)
 Pharmacy Patient Advocate Encounter   Received notification from CoverMyMeds that prior authorization for TEMAZEPAM 15MG  CAPS is required/requested.   Insurance verification completed.   The patient is insured through [facility] .   Per test claim: PA required; PA submitted to above mentioned insurance via CoverMyMeds Key/confirmation #/EOC [id number] Status is pending

## 2023-10-31 NOTE — Telephone Encounter (Signed)
 Pharmacy Patient Advocate Encounter  Received notification from [employer] that Prior Authorization for TEMAZEPAM 15MG  CAPS has been DENIED.  Full denial letter will be uploaded to the media tab. See denial reason below.   PA #/Case ID/Reference #: [id number]

## 2023-11-01 ENCOUNTER — Ambulatory Visit (HOSPITAL_BASED_OUTPATIENT_CLINIC_OR_DEPARTMENT_OTHER)
Admission: RE | Admit: 2023-11-01 | Discharge: 2023-11-01 | Disposition: A | Discharge status: Home / Self Care | Source: Ambulatory Visit | Attending: Adult Health | Admitting: Adult Health

## 2023-11-01 DIAGNOSIS — Z780 Asymptomatic menopausal state: Secondary | ICD-10-CM | POA: Diagnosis not present

## 2023-11-01 DIAGNOSIS — M810 Age-related osteoporosis without current pathological fracture: Secondary | ICD-10-CM | POA: Diagnosis not present

## 2023-11-02 DIAGNOSIS — F32A Depression, unspecified: Secondary | ICD-10-CM | POA: Diagnosis not present

## 2023-11-02 DIAGNOSIS — J439 Emphysema, unspecified: Secondary | ICD-10-CM | POA: Diagnosis not present

## 2023-11-02 DIAGNOSIS — E440 Moderate protein-calorie malnutrition: Secondary | ICD-10-CM | POA: Diagnosis not present

## 2023-11-02 DIAGNOSIS — F419 Anxiety disorder, unspecified: Secondary | ICD-10-CM | POA: Diagnosis not present

## 2023-11-02 DIAGNOSIS — J4489 Other specified chronic obstructive pulmonary disease: Secondary | ICD-10-CM | POA: Diagnosis not present

## 2023-11-02 DIAGNOSIS — J9611 Chronic respiratory failure with hypoxia: Secondary | ICD-10-CM | POA: Diagnosis not present

## 2023-11-07 NOTE — Telephone Encounter (Signed)
 Left message to return phone call.

## 2023-11-08 ENCOUNTER — Other Ambulatory Visit: Payer: Self-pay | Admitting: Adult Health

## 2023-11-08 ENCOUNTER — Telehealth: Payer: Self-pay | Admitting: *Deleted

## 2023-11-08 DIAGNOSIS — E039 Hypothyroidism, unspecified: Secondary | ICD-10-CM

## 2023-11-08 MED ORDER — LEVOTHYROXINE SODIUM 88 MCG PO TABS: 88.0000 ug | ORAL_TABLET | Freq: Every day | ORAL | 0 refills | Status: DC

## 2023-11-08 NOTE — Telephone Encounter (Signed)
 Pt daughter Heidi [name] notified of update and stated they pay out of pocket for  RX.

## 2023-11-08 NOTE — Telephone Encounter (Signed)
This has been taking care of.

## 2023-11-08 NOTE — Telephone Encounter (Signed)
 Copied from CRM [id number]. Topic: Clinical - Lab/Test Results >> Nov 08, 2023 11:34 AM [place] wrote: Reason for CRM: Haskell Linker returning call about bone scan results- [phone]

## 2023-11-13 DIAGNOSIS — J4489 Other specified chronic obstructive pulmonary disease: Secondary | ICD-10-CM | POA: Diagnosis not present

## 2023-11-13 DIAGNOSIS — J439 Emphysema, unspecified: Secondary | ICD-10-CM | POA: Diagnosis not present

## 2023-11-13 DIAGNOSIS — F32A Depression, unspecified: Secondary | ICD-10-CM | POA: Diagnosis not present

## 2023-11-13 DIAGNOSIS — E440 Moderate protein-calorie malnutrition: Secondary | ICD-10-CM | POA: Diagnosis not present

## 2023-11-13 DIAGNOSIS — F419 Anxiety disorder, unspecified: Secondary | ICD-10-CM | POA: Diagnosis not present

## 2023-11-13 DIAGNOSIS — J9611 Chronic respiratory failure with hypoxia: Secondary | ICD-10-CM | POA: Diagnosis not present

## 2023-11-14 DIAGNOSIS — J439 Emphysema, unspecified: Secondary | ICD-10-CM | POA: Diagnosis not present

## 2023-11-14 DIAGNOSIS — F419 Anxiety disorder, unspecified: Secondary | ICD-10-CM | POA: Diagnosis not present

## 2023-11-14 DIAGNOSIS — E440 Moderate protein-calorie malnutrition: Secondary | ICD-10-CM | POA: Diagnosis not present

## 2023-11-14 DIAGNOSIS — J4489 Other specified chronic obstructive pulmonary disease: Secondary | ICD-10-CM | POA: Diagnosis not present

## 2023-11-14 DIAGNOSIS — J9611 Chronic respiratory failure with hypoxia: Secondary | ICD-10-CM | POA: Diagnosis not present

## 2023-11-14 DIAGNOSIS — F32A Depression, unspecified: Secondary | ICD-10-CM | POA: Diagnosis not present

## 2023-11-18 DIAGNOSIS — G4700 Insomnia, unspecified: Secondary | ICD-10-CM | POA: Diagnosis not present

## 2023-11-18 DIAGNOSIS — F32A Depression, unspecified: Secondary | ICD-10-CM | POA: Diagnosis not present

## 2023-11-18 DIAGNOSIS — Z8616 Personal history of COVID-19: Secondary | ICD-10-CM | POA: Diagnosis not present

## 2023-11-18 DIAGNOSIS — J9611 Chronic respiratory failure with hypoxia: Secondary | ICD-10-CM | POA: Diagnosis not present

## 2023-11-18 DIAGNOSIS — F419 Anxiety disorder, unspecified: Secondary | ICD-10-CM | POA: Diagnosis not present

## 2023-11-18 DIAGNOSIS — J439 Emphysema, unspecified: Secondary | ICD-10-CM | POA: Diagnosis not present

## 2023-11-18 DIAGNOSIS — E440 Moderate protein-calorie malnutrition: Secondary | ICD-10-CM | POA: Diagnosis not present

## 2023-11-18 DIAGNOSIS — Z8701 Personal history of pneumonia (recurrent): Secondary | ICD-10-CM | POA: Diagnosis not present

## 2023-11-18 DIAGNOSIS — R339 Retention of urine, unspecified: Secondary | ICD-10-CM | POA: Diagnosis not present

## 2023-11-18 DIAGNOSIS — E039 Hypothyroidism, unspecified: Secondary | ICD-10-CM | POA: Diagnosis not present

## 2023-11-18 DIAGNOSIS — J4489 Other specified chronic obstructive pulmonary disease: Secondary | ICD-10-CM | POA: Diagnosis not present

## 2023-11-18 DIAGNOSIS — E871 Hypo-osmolality and hyponatremia: Secondary | ICD-10-CM | POA: Diagnosis not present

## 2023-11-18 DIAGNOSIS — M1990 Unspecified osteoarthritis, unspecified site: Secondary | ICD-10-CM | POA: Diagnosis not present

## 2023-11-18 DIAGNOSIS — Z9981 Dependence on supplemental oxygen: Secondary | ICD-10-CM | POA: Diagnosis not present

## 2023-11-18 DIAGNOSIS — E785 Hyperlipidemia, unspecified: Secondary | ICD-10-CM | POA: Diagnosis not present

## 2023-11-19 DIAGNOSIS — M1990 Unspecified osteoarthritis, unspecified site: Secondary | ICD-10-CM | POA: Diagnosis not present

## 2023-11-19 DIAGNOSIS — J4489 Other specified chronic obstructive pulmonary disease: Secondary | ICD-10-CM | POA: Diagnosis not present

## 2023-11-19 DIAGNOSIS — F419 Anxiety disorder, unspecified: Secondary | ICD-10-CM | POA: Diagnosis not present

## 2023-11-19 DIAGNOSIS — J439 Emphysema, unspecified: Secondary | ICD-10-CM | POA: Diagnosis not present

## 2023-11-19 DIAGNOSIS — J9611 Chronic respiratory failure with hypoxia: Secondary | ICD-10-CM | POA: Diagnosis not present

## 2023-11-19 DIAGNOSIS — E440 Moderate protein-calorie malnutrition: Secondary | ICD-10-CM | POA: Diagnosis not present

## 2023-11-22 DIAGNOSIS — J4489 Other specified chronic obstructive pulmonary disease: Secondary | ICD-10-CM | POA: Diagnosis not present

## 2023-11-22 DIAGNOSIS — E440 Moderate protein-calorie malnutrition: Secondary | ICD-10-CM | POA: Diagnosis not present

## 2023-11-22 DIAGNOSIS — F419 Anxiety disorder, unspecified: Secondary | ICD-10-CM | POA: Diagnosis not present

## 2023-11-22 DIAGNOSIS — J9611 Chronic respiratory failure with hypoxia: Secondary | ICD-10-CM | POA: Diagnosis not present

## 2023-11-22 DIAGNOSIS — J439 Emphysema, unspecified: Secondary | ICD-10-CM | POA: Diagnosis not present

## 2023-11-22 DIAGNOSIS — M1990 Unspecified osteoarthritis, unspecified site: Secondary | ICD-10-CM | POA: Diagnosis not present

## 2023-11-26 DIAGNOSIS — M1990 Unspecified osteoarthritis, unspecified site: Secondary | ICD-10-CM | POA: Diagnosis not present

## 2023-11-26 DIAGNOSIS — J439 Emphysema, unspecified: Secondary | ICD-10-CM | POA: Diagnosis not present

## 2023-11-26 DIAGNOSIS — J4489 Other specified chronic obstructive pulmonary disease: Secondary | ICD-10-CM | POA: Diagnosis not present

## 2023-11-26 DIAGNOSIS — F419 Anxiety disorder, unspecified: Secondary | ICD-10-CM | POA: Diagnosis not present

## 2023-11-26 DIAGNOSIS — J9611 Chronic respiratory failure with hypoxia: Secondary | ICD-10-CM | POA: Diagnosis not present

## 2023-11-26 DIAGNOSIS — E440 Moderate protein-calorie malnutrition: Secondary | ICD-10-CM | POA: Diagnosis not present

## 2023-11-30 ENCOUNTER — Other Ambulatory Visit (INDEPENDENT_AMBULATORY_CARE_PROVIDER_SITE_OTHER)

## 2023-11-30 DIAGNOSIS — E039 Hypothyroidism, unspecified: Secondary | ICD-10-CM

## 2023-12-03 DIAGNOSIS — J439 Emphysema, unspecified: Secondary | ICD-10-CM | POA: Diagnosis not present

## 2023-12-03 DIAGNOSIS — J4489 Other specified chronic obstructive pulmonary disease: Secondary | ICD-10-CM | POA: Diagnosis not present

## 2023-12-03 DIAGNOSIS — E440 Moderate protein-calorie malnutrition: Secondary | ICD-10-CM | POA: Diagnosis not present

## 2023-12-03 DIAGNOSIS — J9611 Chronic respiratory failure with hypoxia: Secondary | ICD-10-CM | POA: Diagnosis not present

## 2023-12-03 DIAGNOSIS — M1990 Unspecified osteoarthritis, unspecified site: Secondary | ICD-10-CM | POA: Diagnosis not present

## 2023-12-03 DIAGNOSIS — F419 Anxiety disorder, unspecified: Secondary | ICD-10-CM | POA: Diagnosis not present

## 2023-12-04 ENCOUNTER — Ambulatory Visit: Payer: Self-pay | Admitting: Adult Health

## 2023-12-04 ENCOUNTER — Other Ambulatory Visit: Payer: Self-pay | Admitting: Adult Health

## 2023-12-04 LAB — TSH: TSH: 1.93 u[IU]/mL (ref 0.35–5.50)

## 2023-12-04 MED ORDER — LEVOTHYROXINE SODIUM 88 MCG PO TABS: 88.0000 ug | ORAL_TABLET | Freq: Every day | ORAL | 3 refills | Status: DC

## 2023-12-05 DIAGNOSIS — J4489 Other specified chronic obstructive pulmonary disease: Secondary | ICD-10-CM | POA: Diagnosis not present

## 2023-12-05 DIAGNOSIS — M1990 Unspecified osteoarthritis, unspecified site: Secondary | ICD-10-CM | POA: Diagnosis not present

## 2023-12-05 DIAGNOSIS — J9611 Chronic respiratory failure with hypoxia: Secondary | ICD-10-CM | POA: Diagnosis not present

## 2023-12-05 DIAGNOSIS — J439 Emphysema, unspecified: Secondary | ICD-10-CM | POA: Diagnosis not present

## 2023-12-05 DIAGNOSIS — E440 Moderate protein-calorie malnutrition: Secondary | ICD-10-CM | POA: Diagnosis not present

## 2023-12-05 DIAGNOSIS — F419 Anxiety disorder, unspecified: Secondary | ICD-10-CM | POA: Diagnosis not present

## 2023-12-06 ENCOUNTER — Other Ambulatory Visit: Payer: Self-pay | Admitting: Adult Health

## 2023-12-12 DIAGNOSIS — J439 Emphysema, unspecified: Secondary | ICD-10-CM | POA: Diagnosis not present

## 2023-12-12 DIAGNOSIS — J4489 Other specified chronic obstructive pulmonary disease: Secondary | ICD-10-CM | POA: Diagnosis not present

## 2023-12-12 DIAGNOSIS — M1990 Unspecified osteoarthritis, unspecified site: Secondary | ICD-10-CM | POA: Diagnosis not present

## 2023-12-12 DIAGNOSIS — E440 Moderate protein-calorie malnutrition: Secondary | ICD-10-CM | POA: Diagnosis not present

## 2023-12-12 DIAGNOSIS — J9611 Chronic respiratory failure with hypoxia: Secondary | ICD-10-CM | POA: Diagnosis not present

## 2023-12-12 DIAGNOSIS — F419 Anxiety disorder, unspecified: Secondary | ICD-10-CM | POA: Diagnosis not present

## 2023-12-18 DIAGNOSIS — E440 Moderate protein-calorie malnutrition: Secondary | ICD-10-CM | POA: Diagnosis not present

## 2023-12-18 DIAGNOSIS — E871 Hypo-osmolality and hyponatremia: Secondary | ICD-10-CM | POA: Diagnosis not present

## 2023-12-18 DIAGNOSIS — J439 Emphysema, unspecified: Secondary | ICD-10-CM | POA: Diagnosis not present

## 2023-12-18 DIAGNOSIS — Z8701 Personal history of pneumonia (recurrent): Secondary | ICD-10-CM | POA: Diagnosis not present

## 2023-12-18 DIAGNOSIS — Z9981 Dependence on supplemental oxygen: Secondary | ICD-10-CM | POA: Diagnosis not present

## 2023-12-18 DIAGNOSIS — E785 Hyperlipidemia, unspecified: Secondary | ICD-10-CM | POA: Diagnosis not present

## 2023-12-18 DIAGNOSIS — Z8616 Personal history of COVID-19: Secondary | ICD-10-CM | POA: Diagnosis not present

## 2023-12-18 DIAGNOSIS — J9611 Chronic respiratory failure with hypoxia: Secondary | ICD-10-CM | POA: Diagnosis not present

## 2023-12-18 DIAGNOSIS — M1990 Unspecified osteoarthritis, unspecified site: Secondary | ICD-10-CM | POA: Diagnosis not present

## 2023-12-18 DIAGNOSIS — F419 Anxiety disorder, unspecified: Secondary | ICD-10-CM | POA: Diagnosis not present

## 2023-12-18 DIAGNOSIS — R339 Retention of urine, unspecified: Secondary | ICD-10-CM | POA: Diagnosis not present

## 2023-12-18 DIAGNOSIS — E039 Hypothyroidism, unspecified: Secondary | ICD-10-CM | POA: Diagnosis not present

## 2023-12-18 DIAGNOSIS — J4489 Other specified chronic obstructive pulmonary disease: Secondary | ICD-10-CM | POA: Diagnosis not present

## 2023-12-18 DIAGNOSIS — G4700 Insomnia, unspecified: Secondary | ICD-10-CM | POA: Diagnosis not present

## 2023-12-18 DIAGNOSIS — F32A Depression, unspecified: Secondary | ICD-10-CM | POA: Diagnosis not present

## 2023-12-19 DIAGNOSIS — J439 Emphysema, unspecified: Secondary | ICD-10-CM | POA: Diagnosis not present

## 2023-12-19 DIAGNOSIS — F419 Anxiety disorder, unspecified: Secondary | ICD-10-CM | POA: Diagnosis not present

## 2023-12-19 DIAGNOSIS — E440 Moderate protein-calorie malnutrition: Secondary | ICD-10-CM | POA: Diagnosis not present

## 2023-12-19 DIAGNOSIS — M1990 Unspecified osteoarthritis, unspecified site: Secondary | ICD-10-CM | POA: Diagnosis not present

## 2023-12-19 DIAGNOSIS — J9611 Chronic respiratory failure with hypoxia: Secondary | ICD-10-CM | POA: Diagnosis not present

## 2023-12-19 DIAGNOSIS — J4489 Other specified chronic obstructive pulmonary disease: Secondary | ICD-10-CM | POA: Diagnosis not present

## 2023-12-26 DIAGNOSIS — F419 Anxiety disorder, unspecified: Secondary | ICD-10-CM | POA: Diagnosis not present

## 2023-12-26 DIAGNOSIS — E440 Moderate protein-calorie malnutrition: Secondary | ICD-10-CM | POA: Diagnosis not present

## 2023-12-26 DIAGNOSIS — J439 Emphysema, unspecified: Secondary | ICD-10-CM | POA: Diagnosis not present

## 2023-12-26 DIAGNOSIS — J9611 Chronic respiratory failure with hypoxia: Secondary | ICD-10-CM | POA: Diagnosis not present

## 2023-12-26 DIAGNOSIS — J4489 Other specified chronic obstructive pulmonary disease: Secondary | ICD-10-CM | POA: Diagnosis not present

## 2023-12-26 DIAGNOSIS — M1990 Unspecified osteoarthritis, unspecified site: Secondary | ICD-10-CM | POA: Diagnosis not present

## 2023-12-27 ENCOUNTER — Other Ambulatory Visit: Payer: Self-pay | Admitting: Adult Health

## 2023-12-27 DIAGNOSIS — R630 Anorexia: Secondary | ICD-10-CM

## 2023-12-27 DIAGNOSIS — R109 Unspecified abdominal pain: Secondary | ICD-10-CM

## 2024-01-02 DIAGNOSIS — J4489 Other specified chronic obstructive pulmonary disease: Secondary | ICD-10-CM | POA: Diagnosis not present

## 2024-01-02 DIAGNOSIS — J9611 Chronic respiratory failure with hypoxia: Secondary | ICD-10-CM | POA: Diagnosis not present

## 2024-01-02 DIAGNOSIS — F419 Anxiety disorder, unspecified: Secondary | ICD-10-CM | POA: Diagnosis not present

## 2024-01-02 DIAGNOSIS — E440 Moderate protein-calorie malnutrition: Secondary | ICD-10-CM | POA: Diagnosis not present

## 2024-01-02 DIAGNOSIS — M1990 Unspecified osteoarthritis, unspecified site: Secondary | ICD-10-CM | POA: Diagnosis not present

## 2024-01-02 DIAGNOSIS — J439 Emphysema, unspecified: Secondary | ICD-10-CM | POA: Diagnosis not present

## 2024-01-09 DIAGNOSIS — J9611 Chronic respiratory failure with hypoxia: Secondary | ICD-10-CM | POA: Diagnosis not present

## 2024-01-09 DIAGNOSIS — F419 Anxiety disorder, unspecified: Secondary | ICD-10-CM | POA: Diagnosis not present

## 2024-01-09 DIAGNOSIS — J439 Emphysema, unspecified: Secondary | ICD-10-CM | POA: Diagnosis not present

## 2024-01-09 DIAGNOSIS — E440 Moderate protein-calorie malnutrition: Secondary | ICD-10-CM | POA: Diagnosis not present

## 2024-01-09 DIAGNOSIS — J4489 Other specified chronic obstructive pulmonary disease: Secondary | ICD-10-CM | POA: Diagnosis not present

## 2024-01-09 DIAGNOSIS — M1990 Unspecified osteoarthritis, unspecified site: Secondary | ICD-10-CM | POA: Diagnosis not present

## 2024-01-14 DIAGNOSIS — F419 Anxiety disorder, unspecified: Secondary | ICD-10-CM | POA: Diagnosis not present

## 2024-01-14 DIAGNOSIS — J4489 Other specified chronic obstructive pulmonary disease: Secondary | ICD-10-CM | POA: Diagnosis not present

## 2024-01-14 DIAGNOSIS — J439 Emphysema, unspecified: Secondary | ICD-10-CM | POA: Diagnosis not present

## 2024-01-14 DIAGNOSIS — J9611 Chronic respiratory failure with hypoxia: Secondary | ICD-10-CM | POA: Diagnosis not present

## 2024-01-14 DIAGNOSIS — E440 Moderate protein-calorie malnutrition: Secondary | ICD-10-CM | POA: Diagnosis not present

## 2024-01-14 DIAGNOSIS — M1990 Unspecified osteoarthritis, unspecified site: Secondary | ICD-10-CM | POA: Diagnosis not present

## 2024-01-17 DIAGNOSIS — F419 Anxiety disorder, unspecified: Secondary | ICD-10-CM | POA: Diagnosis not present

## 2024-01-17 DIAGNOSIS — E871 Hypo-osmolality and hyponatremia: Secondary | ICD-10-CM | POA: Diagnosis not present

## 2024-01-17 DIAGNOSIS — R339 Retention of urine, unspecified: Secondary | ICD-10-CM | POA: Diagnosis not present

## 2024-01-17 DIAGNOSIS — J439 Emphysema, unspecified: Secondary | ICD-10-CM | POA: Diagnosis not present

## 2024-01-17 DIAGNOSIS — J4489 Other specified chronic obstructive pulmonary disease: Secondary | ICD-10-CM | POA: Diagnosis not present

## 2024-01-17 DIAGNOSIS — E785 Hyperlipidemia, unspecified: Secondary | ICD-10-CM | POA: Diagnosis not present

## 2024-01-17 DIAGNOSIS — E039 Hypothyroidism, unspecified: Secondary | ICD-10-CM | POA: Diagnosis not present

## 2024-01-17 DIAGNOSIS — M1990 Unspecified osteoarthritis, unspecified site: Secondary | ICD-10-CM | POA: Diagnosis not present

## 2024-01-17 DIAGNOSIS — Z8616 Personal history of COVID-19: Secondary | ICD-10-CM | POA: Diagnosis not present

## 2024-01-17 DIAGNOSIS — Z8701 Personal history of pneumonia (recurrent): Secondary | ICD-10-CM | POA: Diagnosis not present

## 2024-01-17 DIAGNOSIS — Z9981 Dependence on supplemental oxygen: Secondary | ICD-10-CM | POA: Diagnosis not present

## 2024-01-17 DIAGNOSIS — G4700 Insomnia, unspecified: Secondary | ICD-10-CM | POA: Diagnosis not present

## 2024-01-17 DIAGNOSIS — F32A Depression, unspecified: Secondary | ICD-10-CM | POA: Diagnosis not present

## 2024-01-17 DIAGNOSIS — J9611 Chronic respiratory failure with hypoxia: Secondary | ICD-10-CM | POA: Diagnosis not present

## 2024-01-17 DIAGNOSIS — E440 Moderate protein-calorie malnutrition: Secondary | ICD-10-CM | POA: Diagnosis not present

## 2024-01-22 DIAGNOSIS — J439 Emphysema, unspecified: Secondary | ICD-10-CM | POA: Diagnosis not present

## 2024-01-22 DIAGNOSIS — F419 Anxiety disorder, unspecified: Secondary | ICD-10-CM | POA: Diagnosis not present

## 2024-01-22 DIAGNOSIS — J9611 Chronic respiratory failure with hypoxia: Secondary | ICD-10-CM | POA: Diagnosis not present

## 2024-01-22 DIAGNOSIS — J4489 Other specified chronic obstructive pulmonary disease: Secondary | ICD-10-CM | POA: Diagnosis not present

## 2024-01-22 DIAGNOSIS — M1990 Unspecified osteoarthritis, unspecified site: Secondary | ICD-10-CM | POA: Diagnosis not present

## 2024-01-22 DIAGNOSIS — E440 Moderate protein-calorie malnutrition: Secondary | ICD-10-CM | POA: Diagnosis not present

## 2024-01-28 ENCOUNTER — Other Ambulatory Visit: Payer: Self-pay | Admitting: Adult Health

## 2024-01-28 DIAGNOSIS — G4700 Insomnia, unspecified: Secondary | ICD-10-CM

## 2024-01-29 DIAGNOSIS — J439 Emphysema, unspecified: Secondary | ICD-10-CM | POA: Diagnosis not present

## 2024-01-29 DIAGNOSIS — E440 Moderate protein-calorie malnutrition: Secondary | ICD-10-CM | POA: Diagnosis not present

## 2024-01-29 DIAGNOSIS — J9611 Chronic respiratory failure with hypoxia: Secondary | ICD-10-CM | POA: Diagnosis not present

## 2024-01-29 DIAGNOSIS — F419 Anxiety disorder, unspecified: Secondary | ICD-10-CM | POA: Diagnosis not present

## 2024-01-29 DIAGNOSIS — M1990 Unspecified osteoarthritis, unspecified site: Secondary | ICD-10-CM | POA: Diagnosis not present

## 2024-01-29 DIAGNOSIS — J4489 Other specified chronic obstructive pulmonary disease: Secondary | ICD-10-CM | POA: Diagnosis not present

## 2024-02-05 DIAGNOSIS — J9611 Chronic respiratory failure with hypoxia: Secondary | ICD-10-CM | POA: Diagnosis not present

## 2024-02-05 DIAGNOSIS — J439 Emphysema, unspecified: Secondary | ICD-10-CM | POA: Diagnosis not present

## 2024-02-05 DIAGNOSIS — M1990 Unspecified osteoarthritis, unspecified site: Secondary | ICD-10-CM | POA: Diagnosis not present

## 2024-02-05 DIAGNOSIS — J4489 Other specified chronic obstructive pulmonary disease: Secondary | ICD-10-CM | POA: Diagnosis not present

## 2024-02-05 DIAGNOSIS — E440 Moderate protein-calorie malnutrition: Secondary | ICD-10-CM | POA: Diagnosis not present

## 2024-02-05 DIAGNOSIS — F419 Anxiety disorder, unspecified: Secondary | ICD-10-CM | POA: Diagnosis not present

## 2024-02-16 DIAGNOSIS — E440 Moderate protein-calorie malnutrition: Secondary | ICD-10-CM | POA: Diagnosis not present

## 2024-02-16 DIAGNOSIS — E785 Hyperlipidemia, unspecified: Secondary | ICD-10-CM | POA: Diagnosis not present

## 2024-02-16 DIAGNOSIS — E039 Hypothyroidism, unspecified: Secondary | ICD-10-CM | POA: Diagnosis not present

## 2024-02-16 DIAGNOSIS — Z8616 Personal history of COVID-19: Secondary | ICD-10-CM | POA: Diagnosis not present

## 2024-02-16 DIAGNOSIS — M1990 Unspecified osteoarthritis, unspecified site: Secondary | ICD-10-CM | POA: Diagnosis not present

## 2024-02-16 DIAGNOSIS — F419 Anxiety disorder, unspecified: Secondary | ICD-10-CM | POA: Diagnosis not present

## 2024-02-16 DIAGNOSIS — F32A Depression, unspecified: Secondary | ICD-10-CM | POA: Diagnosis not present

## 2024-02-16 DIAGNOSIS — Z9981 Dependence on supplemental oxygen: Secondary | ICD-10-CM | POA: Diagnosis not present

## 2024-02-16 DIAGNOSIS — J4489 Other specified chronic obstructive pulmonary disease: Secondary | ICD-10-CM | POA: Diagnosis not present

## 2024-02-16 DIAGNOSIS — G4700 Insomnia, unspecified: Secondary | ICD-10-CM | POA: Diagnosis not present

## 2024-02-16 DIAGNOSIS — Z8701 Personal history of pneumonia (recurrent): Secondary | ICD-10-CM | POA: Diagnosis not present

## 2024-02-16 DIAGNOSIS — J439 Emphysema, unspecified: Secondary | ICD-10-CM | POA: Diagnosis not present

## 2024-02-16 DIAGNOSIS — E871 Hypo-osmolality and hyponatremia: Secondary | ICD-10-CM | POA: Diagnosis not present

## 2024-02-16 DIAGNOSIS — R339 Retention of urine, unspecified: Secondary | ICD-10-CM | POA: Diagnosis not present

## 2024-02-16 DIAGNOSIS — J9611 Chronic respiratory failure with hypoxia: Secondary | ICD-10-CM | POA: Diagnosis not present

## 2024-02-25 DIAGNOSIS — J4489 Other specified chronic obstructive pulmonary disease: Secondary | ICD-10-CM | POA: Diagnosis not present

## 2024-02-25 DIAGNOSIS — E440 Moderate protein-calorie malnutrition: Secondary | ICD-10-CM | POA: Diagnosis not present

## 2024-02-25 DIAGNOSIS — F419 Anxiety disorder, unspecified: Secondary | ICD-10-CM | POA: Diagnosis not present

## 2024-02-25 DIAGNOSIS — M1990 Unspecified osteoarthritis, unspecified site: Secondary | ICD-10-CM | POA: Diagnosis not present

## 2024-02-25 DIAGNOSIS — J9611 Chronic respiratory failure with hypoxia: Secondary | ICD-10-CM | POA: Diagnosis not present

## 2024-02-25 DIAGNOSIS — J439 Emphysema, unspecified: Secondary | ICD-10-CM | POA: Diagnosis not present

## 2024-02-26 ENCOUNTER — Other Ambulatory Visit: Payer: Self-pay | Admitting: Adult Health

## 2024-02-26 DIAGNOSIS — E039 Hypothyroidism, unspecified: Secondary | ICD-10-CM

## 2024-03-11 DIAGNOSIS — J439 Emphysema, unspecified: Secondary | ICD-10-CM | POA: Diagnosis not present

## 2024-03-11 DIAGNOSIS — J4489 Other specified chronic obstructive pulmonary disease: Secondary | ICD-10-CM | POA: Diagnosis not present

## 2024-03-11 DIAGNOSIS — M1990 Unspecified osteoarthritis, unspecified site: Secondary | ICD-10-CM | POA: Diagnosis not present

## 2024-03-11 DIAGNOSIS — J9611 Chronic respiratory failure with hypoxia: Secondary | ICD-10-CM | POA: Diagnosis not present

## 2024-03-11 DIAGNOSIS — F419 Anxiety disorder, unspecified: Secondary | ICD-10-CM | POA: Diagnosis not present

## 2024-03-11 DIAGNOSIS — E440 Moderate protein-calorie malnutrition: Secondary | ICD-10-CM | POA: Diagnosis not present

## 2024-04-21 ENCOUNTER — Other Ambulatory Visit: Payer: Self-pay | Admitting: Adult Health

## 2024-04-21 DIAGNOSIS — F39 Unspecified mood [affective] disorder: Secondary | ICD-10-CM

## 2024-04-21 DIAGNOSIS — E039 Hypothyroidism, unspecified: Secondary | ICD-10-CM

## 2024-04-21 DIAGNOSIS — R630 Anorexia: Secondary | ICD-10-CM

## 2024-04-21 DIAGNOSIS — G4700 Insomnia, unspecified: Secondary | ICD-10-CM

## 2024-04-21 DIAGNOSIS — R109 Unspecified abdominal pain: Secondary | ICD-10-CM

## 2024-04-22 NOTE — Telephone Encounter (Signed)
 Okay for refill?

## 2024-07-28 ENCOUNTER — Other Ambulatory Visit: Payer: Self-pay | Admitting: Adult Health

## 2024-07-28 DIAGNOSIS — G4700 Insomnia, unspecified: Secondary | ICD-10-CM

## 2024-08-16 ENCOUNTER — Other Ambulatory Visit: Payer: Self-pay | Admitting: Adult Health

## 2024-08-16 DIAGNOSIS — M810 Age-related osteoporosis without current pathological fracture: Secondary | ICD-10-CM

## 2024-08-20 ENCOUNTER — Other Ambulatory Visit: Payer: Self-pay | Admitting: Adult Health

## 2024-11-03 ENCOUNTER — Ambulatory Visit
